# Patient Record
Sex: Female | Born: 1940
Health system: Southern US, Community
[De-identification: ages and names within clinical notes are randomized; demographics above are authoritative.]

## PROBLEM LIST (undated history)

## (undated) DIAGNOSIS — E0500 Thyrotoxicosis with diffuse goiter without thyrotoxic crisis or storm: Secondary | ICD-10-CM

## (undated) DIAGNOSIS — Z9889 Other specified postprocedural states: Secondary | ICD-10-CM

## (undated) DIAGNOSIS — R6520 Severe sepsis without septic shock: Secondary | ICD-10-CM

## (undated) DIAGNOSIS — C50919 Malignant neoplasm of unspecified site of unspecified female breast: Secondary | ICD-10-CM

## (undated) DIAGNOSIS — E781 Pure hyperglyceridemia: Secondary | ICD-10-CM

## (undated) DIAGNOSIS — E0590 Thyrotoxicosis, unspecified without thyrotoxic crisis or storm: Secondary | ICD-10-CM

## (undated) DIAGNOSIS — N179 Acute kidney failure, unspecified: Secondary | ICD-10-CM

## (undated) DIAGNOSIS — R112 Nausea with vomiting, unspecified: Secondary | ICD-10-CM

## (undated) DIAGNOSIS — J189 Pneumonia, unspecified organism: Secondary | ICD-10-CM

## (undated) DIAGNOSIS — J449 Chronic obstructive pulmonary disease, unspecified: Secondary | ICD-10-CM

## (undated) DIAGNOSIS — Z87442 Personal history of urinary calculi: Secondary | ICD-10-CM

## (undated) DIAGNOSIS — A4151 Sepsis due to Escherichia coli [E. coli]: Secondary | ICD-10-CM

## (undated) DIAGNOSIS — R519 Headache, unspecified: Secondary | ICD-10-CM

## (undated) DIAGNOSIS — T148XXA Other injury of unspecified body region, initial encounter: Secondary | ICD-10-CM

## (undated) DIAGNOSIS — K8020 Calculus of gallbladder without cholecystitis without obstruction: Secondary | ICD-10-CM

## (undated) HISTORY — DX: Other injury of unspecified body region, initial encounter: T14.8XXA

## (undated) HISTORY — PX: OTHER SURGICAL HISTORY: SHX169

## (undated) HISTORY — DX: Acute kidney failure, unspecified: N17.9

## (undated) HISTORY — PX: CARPAL TUNNEL RELEASE: SHX101

## (undated) HISTORY — PX: COLONOSCOPY: SHX174

## (undated) HISTORY — PX: NEPHRECTOMY: SHX65

## (undated) HISTORY — PX: EYE SURGERY: SHX253

## (undated) HISTORY — DX: Thyrotoxicosis with diffuse goiter without thyrotoxic crisis or storm: E05.00

## (undated) HISTORY — PX: TONSILLECTOMY: SUR1361

## (undated) HISTORY — DX: Sepsis due to Escherichia coli (e. coli): R65.20

## (undated) HISTORY — DX: Malignant neoplasm of unspecified site of unspecified female breast: C50.919

## (undated) HISTORY — DX: Pure hyperglyceridemia: E78.1

## (undated) HISTORY — PX: MASTECTOMY: SHX3

## (undated) HISTORY — DX: Acute kidney failure, unspecified: A41.51

## (undated) HISTORY — DX: Calculus of gallbladder without cholecystitis without obstruction: K80.20

---
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
## 1988-09-03 DIAGNOSIS — J189 Pneumonia, unspecified organism: Secondary | ICD-10-CM

## 1988-09-03 HISTORY — DX: Pneumonia, unspecified organism: J18.9

## 2001-03-12 ENCOUNTER — Encounter (HOSPITAL_COMMUNITY): Admission: RE | Admit: 2001-03-12 | Discharge: 2001-04-11 | Payer: Self-pay | Admitting: Oncology

## 2001-03-12 ENCOUNTER — Encounter: Admission: RE | Admit: 2001-03-12 | Discharge: 2001-03-12 | Payer: Self-pay | Admitting: Oncology

## 2001-03-17 ENCOUNTER — Other Ambulatory Visit: Admission: RE | Admit: 2001-03-17 | Discharge: 2001-03-17 | Payer: Self-pay | Admitting: Family Medicine

## 2001-03-28 ENCOUNTER — Encounter (HOSPITAL_COMMUNITY): Payer: Self-pay | Admitting: Oncology

## 2001-04-22 ENCOUNTER — Encounter (HOSPITAL_COMMUNITY): Payer: Self-pay | Admitting: Oncology

## 2001-04-22 ENCOUNTER — Encounter (HOSPITAL_COMMUNITY): Admission: RE | Admit: 2001-04-22 | Discharge: 2001-05-22 | Payer: Self-pay | Admitting: Oncology

## 2001-04-22 ENCOUNTER — Encounter: Admission: RE | Admit: 2001-04-22 | Discharge: 2001-04-22 | Payer: Self-pay | Admitting: Oncology

## 2001-08-08 ENCOUNTER — Encounter: Admission: RE | Admit: 2001-08-08 | Discharge: 2001-08-08 | Payer: Self-pay | Admitting: Oncology

## 2001-08-08 ENCOUNTER — Encounter (HOSPITAL_COMMUNITY): Admission: RE | Admit: 2001-08-08 | Discharge: 2001-09-07 | Payer: Self-pay | Admitting: Oncology

## 2001-11-10 ENCOUNTER — Encounter: Admission: RE | Admit: 2001-11-10 | Discharge: 2001-11-10 | Payer: Self-pay | Admitting: Oncology

## 2001-11-10 ENCOUNTER — Encounter (HOSPITAL_COMMUNITY): Admission: RE | Admit: 2001-11-10 | Discharge: 2001-12-10 | Payer: Self-pay | Admitting: Oncology

## 2002-03-16 ENCOUNTER — Encounter: Admission: RE | Admit: 2002-03-16 | Discharge: 2002-03-16 | Payer: Self-pay | Admitting: Oncology

## 2002-03-16 ENCOUNTER — Encounter (HOSPITAL_COMMUNITY): Admission: RE | Admit: 2002-03-16 | Discharge: 2002-04-15 | Payer: Self-pay | Admitting: Oncology

## 2002-03-26 ENCOUNTER — Encounter: Payer: Self-pay | Admitting: Family Medicine

## 2002-03-26 ENCOUNTER — Ambulatory Visit (HOSPITAL_COMMUNITY): Admission: RE | Admit: 2002-03-26 | Discharge: 2002-03-26 | Payer: Self-pay | Admitting: Family Medicine

## 2002-09-14 ENCOUNTER — Encounter: Admission: RE | Admit: 2002-09-14 | Discharge: 2002-09-14 | Payer: Self-pay | Admitting: Oncology

## 2002-09-14 ENCOUNTER — Encounter (HOSPITAL_COMMUNITY): Admission: RE | Admit: 2002-09-14 | Discharge: 2002-10-14 | Payer: Self-pay | Admitting: Oncology

## 2003-03-12 ENCOUNTER — Encounter: Admission: RE | Admit: 2003-03-12 | Discharge: 2003-03-12 | Payer: Self-pay | Admitting: Oncology

## 2003-04-16 ENCOUNTER — Encounter: Payer: Self-pay | Admitting: Family Medicine

## 2003-04-16 ENCOUNTER — Ambulatory Visit (HOSPITAL_COMMUNITY): Admission: RE | Admit: 2003-04-16 | Discharge: 2003-04-16 | Payer: Self-pay | Admitting: Family Medicine

## 2004-03-10 ENCOUNTER — Encounter: Admission: RE | Admit: 2004-03-10 | Discharge: 2004-03-10 | Payer: Self-pay | Admitting: Oncology

## 2004-03-10 ENCOUNTER — Encounter (HOSPITAL_COMMUNITY): Admission: RE | Admit: 2004-03-10 | Discharge: 2004-04-09 | Payer: Self-pay | Admitting: Oncology

## 2004-04-20 ENCOUNTER — Ambulatory Visit (HOSPITAL_COMMUNITY): Admission: RE | Admit: 2004-04-20 | Discharge: 2004-04-20 | Payer: Self-pay | Admitting: Family Medicine

## 2005-04-06 ENCOUNTER — Encounter (HOSPITAL_COMMUNITY): Admission: RE | Admit: 2005-04-06 | Discharge: 2005-05-06 | Payer: Self-pay | Admitting: Oncology

## 2005-04-06 ENCOUNTER — Ambulatory Visit (HOSPITAL_COMMUNITY): Payer: Self-pay | Admitting: Oncology

## 2005-04-06 ENCOUNTER — Encounter: Admission: RE | Admit: 2005-04-06 | Discharge: 2005-04-06 | Payer: Self-pay | Admitting: Oncology

## 2005-04-23 ENCOUNTER — Ambulatory Visit (HOSPITAL_COMMUNITY): Admission: RE | Admit: 2005-04-23 | Discharge: 2005-04-23 | Payer: Self-pay | Admitting: Family Medicine

## 2005-05-09 ENCOUNTER — Ambulatory Visit (HOSPITAL_COMMUNITY): Admission: RE | Admit: 2005-05-09 | Discharge: 2005-05-09 | Payer: Self-pay | Admitting: Family Medicine

## 2005-11-21 ENCOUNTER — Ambulatory Visit (HOSPITAL_COMMUNITY): Admission: RE | Admit: 2005-11-21 | Discharge: 2005-11-21 | Payer: Self-pay | Admitting: Family Medicine

## 2006-02-22 ENCOUNTER — Ambulatory Visit (HOSPITAL_COMMUNITY): Admission: RE | Admit: 2006-02-22 | Discharge: 2006-02-22 | Payer: Self-pay | Admitting: Internal Medicine

## 2006-04-01 ENCOUNTER — Ambulatory Visit (HOSPITAL_COMMUNITY): Admission: RE | Admit: 2006-04-01 | Discharge: 2006-04-01 | Payer: Self-pay | Admitting: Neurosurgery

## 2006-04-25 ENCOUNTER — Ambulatory Visit (HOSPITAL_COMMUNITY): Admission: RE | Admit: 2006-04-25 | Discharge: 2006-04-25 | Payer: Self-pay | Admitting: Family Medicine

## 2006-04-25 ENCOUNTER — Ambulatory Visit (HOSPITAL_COMMUNITY): Payer: Self-pay | Admitting: Oncology

## 2006-04-25 ENCOUNTER — Encounter (HOSPITAL_COMMUNITY): Admission: RE | Admit: 2006-04-25 | Discharge: 2006-05-25 | Payer: Self-pay | Admitting: Oncology

## 2006-04-25 ENCOUNTER — Encounter: Admission: RE | Admit: 2006-04-25 | Discharge: 2006-04-25 | Payer: Self-pay | Admitting: Oncology

## 2006-05-10 ENCOUNTER — Ambulatory Visit (HOSPITAL_COMMUNITY): Admission: RE | Admit: 2006-05-10 | Discharge: 2006-05-10 | Payer: Self-pay | Admitting: Internal Medicine

## 2006-05-10 ENCOUNTER — Ambulatory Visit: Payer: Self-pay | Admitting: Internal Medicine

## 2006-05-10 ENCOUNTER — Encounter (INDEPENDENT_AMBULATORY_CARE_PROVIDER_SITE_OTHER): Payer: Self-pay | Admitting: Specialist

## 2006-05-22 ENCOUNTER — Ambulatory Visit (HOSPITAL_COMMUNITY): Admission: RE | Admit: 2006-05-22 | Discharge: 2006-05-22 | Payer: Self-pay | Admitting: Family Medicine

## 2007-04-25 ENCOUNTER — Ambulatory Visit (HOSPITAL_COMMUNITY): Payer: Self-pay | Admitting: Oncology

## 2007-05-26 ENCOUNTER — Ambulatory Visit (HOSPITAL_COMMUNITY): Admission: RE | Admit: 2007-05-26 | Discharge: 2007-05-26 | Payer: Self-pay | Admitting: Family Medicine

## 2007-05-29 ENCOUNTER — Ambulatory Visit (HOSPITAL_COMMUNITY): Admission: RE | Admit: 2007-05-29 | Discharge: 2007-05-29 | Payer: Self-pay | Admitting: Ophthalmology

## 2007-06-19 ENCOUNTER — Ambulatory Visit (HOSPITAL_COMMUNITY): Admission: RE | Admit: 2007-06-19 | Discharge: 2007-06-19 | Payer: Self-pay | Admitting: Ophthalmology

## 2008-04-21 ENCOUNTER — Ambulatory Visit (HOSPITAL_COMMUNITY): Payer: Self-pay | Admitting: Oncology

## 2008-05-26 ENCOUNTER — Ambulatory Visit (HOSPITAL_COMMUNITY): Admission: RE | Admit: 2008-05-26 | Discharge: 2008-05-26 | Payer: Self-pay | Admitting: Family Medicine

## 2009-04-20 ENCOUNTER — Ambulatory Visit (HOSPITAL_COMMUNITY): Payer: Self-pay | Admitting: Oncology

## 2009-05-27 ENCOUNTER — Ambulatory Visit (HOSPITAL_COMMUNITY): Admission: RE | Admit: 2009-05-27 | Discharge: 2009-05-27 | Payer: Self-pay | Admitting: Family Medicine

## 2010-04-19 ENCOUNTER — Ambulatory Visit (HOSPITAL_COMMUNITY): Payer: Self-pay | Admitting: Oncology

## 2010-04-19 ENCOUNTER — Encounter (HOSPITAL_COMMUNITY): Admission: RE | Admit: 2010-04-19 | Discharge: 2010-05-19 | Payer: Self-pay | Admitting: Oncology

## 2010-05-12 ENCOUNTER — Ambulatory Visit (HOSPITAL_COMMUNITY): Admission: RE | Admit: 2010-05-12 | Discharge: 2010-05-12 | Payer: Self-pay | Admitting: Family Medicine

## 2010-05-29 ENCOUNTER — Ambulatory Visit (HOSPITAL_COMMUNITY): Admission: RE | Admit: 2010-05-29 | Discharge: 2010-05-29 | Payer: Self-pay | Admitting: Family Medicine

## 2010-11-16 LAB — COMPREHENSIVE METABOLIC PANEL
ALT: 16 U/L (ref 0–35)
CO2: 27 mEq/L (ref 19–32)
Chloride: 106 mEq/L (ref 96–112)
GFR calc Af Amer: >60 mL/min (ref >60)
Glucose, Bld: 94 mg/dL (ref 70–99)
Potassium: 4.2 mEq/L (ref 3.5–5.1)
Total Protein: 7.1 g/dL (ref 6.0–8.3)

## 2010-11-16 LAB — CBC
MCH: 31.5 pg (ref 26.0–34.0)
MCHC: 33.5 g/dL (ref 30.0–36.0)
MCV: 93.9 fL (ref 78.0–100.0)
RBC: 4.77 MIL/uL (ref 3.87–5.11)
RDW: 13.7 % (ref 11.5–15.5)

## 2010-11-16 LAB — DIFFERENTIAL
Basophils Absolute: 0.1 10*3/uL (ref 0.0–0.1)
Basophils Relative: 1 % (ref 0–1)
Lymphocytes Relative: 20 % (ref 12–46)
Monocytes Relative: 4 % (ref 3–12)
Neutro Abs: 6.7 10*3/uL (ref 1.7–7.7)

## 2011-01-19 NOTE — Consult Note (Signed)
[name], [name]                            ACCOUNT NO.:  [id number]   MEDICAL RECORD NO.:  [id number]                   PATIENT TYPE:   LOCATION:                                       FACILITY:   PHYSICIAN:  Lionel December, M.D.                 DATE OF BIRTH:  07/01/1941   DATE OF CONSULTATION:  DATE OF DISCHARGE:                           GASTROENTEROLOGY CONSULTATION   REASON FOR CONSULTATION:  Heme-positive stools.   HISTORY OF PRESENT ILLNESS:  The patient is a 70 year old female who was  referred through the courtesy of  Dr. Mariel Sleet for further evaluation of  heme-positive stools.  The last one she had 3 Hemoccults as a routine and 1  out of 3 was positive.  The patient tells me that Dr. Mariel Sleet has been  after her for the past few years to have colonoscopy primarily for screening  purposes but she has been postponing it.  Now with the finding of heme-  positive stools further evaluation was recommended.  She denies melena or  rectal bleeding, or change in her bowel habits.  She generally has a BM  daily.  She also denies nausea, vomiting, heartburn, dysphagia, or abdominal  pain. She does not take any OTC NSAIDS.  There is no history of peptic ulcer  disease.  She states that she gained a lot of weight per her thyroid  problem, but then managed to lose about 8 pounds since she is on appropriate  dose of Synthroid.   REVIEW OF SYSTEMS:  Review of the systems are negative for chronic cough or  exertional dyspnea.  She is not aware __________ COPD.   MEDICATIONS:  She is on Synthroid 112 mcg q.d., calcium 600 mg q.d., vitamin  D q.d., M.V.I. q.d. and Tylenol p.r.n.   PAST MEDICAL HISTORY:  History of infiltrating ductal carcinoma, left  breast, diagnosed in 1990, treated with modified radical mastectomy,  followed by NSABP protocol B19 with CMF followed by tamoxifen that she took  for 5 years.  She has remained in remission.  She is getting her yearly  mammography.   She was diagnosed with Graves' disease about 4 years ago and  was treated with radioiodine and now on replacement therapy.  History of  COPD, and per Dr. Thornton Papas note the patient is not aware of this  diagnosis.  I suspect that this is an x-ray diagnosis.  The patient is a  chronic smoker.   Osteopenia diagnosed via bone density study 03/28/01.  She also has DJD of  lumbosacral spine.   PAST SURGICAL HISTORY:  Other surgeries include tonsillectomy several years  ago.   ALLERGIES:  None known.   FAMILY HISTORY:  Mother is 19 years old.  She has advanced dementia and is  in a nursing home.  Father died of lung carcinoma at age 65.  One sister  died at age 30-1/2 years of accidental  overdose.  One brother is deceased  from pneumonia at age 14, apparently this was during the flu season.  Two  brothers are in good health other than they have high cholesterol.   SOCIAL HISTORY:  She is single. She has 2 children.  She worked at [employer] for 21 years. 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  She does not smoke cigarettes, but has  been smoking a pack a day, she states, for 50 years.  She says that she  started to Smoke when she was a child.   PHYSICAL EXAMINATION:  GENERAL:  A pleasant, well-developed, well-nourished,  Caucasian female who is in no acute distress.  VITAL SIGNS:  She weighs 168 pounds.  She is 5 feet 6 inches tall.  Pulse 74  per minute, blood pressure 120/68, temperature is 98.5.  HEENT:  Conjunctivae are pink.  Sclerae nonicteric.  Oropharyngeal mucosa is  normal.  Dentition is in satisfactory condition.  NECK:  Without masses or thyromegaly.  Carotids are 2+ bilaterally without  bruits.  CARDIAC:  Cardiac exam with regular rhythm.  Normal S1 and S2.  No murmur or  gallop noted.  LUNGS:  Auscultation of the lungs revealed a few rhonchi  at the right base.  ABDOMEN:  It is full and symmetrical.  Bowel sounds are normal.  Palpation  reveals a soft abdomen without tenderness, organomegaly, or masses.  RECTAL:  Rectal examination deferred as she had one by Dr. Renard Matter within  the last month or so.  EXTREMITIES:  She does not have clubbing or peripheral edema.   ASSESSMENT:  The patient is a 70 year old Caucasian female who has no  gastrointestinal symptoms and does not take any NSAIDS.  She was found to  have heme-positive stools on routine testing.  Family history is negative  for colorectal carcinoma.  Personal history is significant for breast CA and  she remains in remission.  Because of personal history her risk of colon  carcinoma would be slightly above average.  I agree that she needs to  undergo a colonoscopy.   She does have a few rhonchi along on lung exam.  I did tell her that she has  chronic obstructive pulmonary disease, although she is in asymptomatic stage  and she needs to make every effort to try to quit cigarette smoking.   RECOMMENDATIONS:  Total colonoscopy to be performed at [facility] in the near  future when the patient's schedule permits.  I have reviewed the procedure,  in detail, as well as the risks and alternatives and she is agreeable.   I would like to thank Dr. Mariel Sleet for giving [place] the opportunity to  participate in the care of this nice lady.                                               Lionel December, M.D.    NR/MEDQ  D:  04/08/2002  T:  04/12/2002  Job:  [id number]   cc:   Ladona Horns. Mariel Sleet, M.D.   [facility]   Angus G. Renard Matter, M.D.

## 2011-01-19 NOTE — Op Note (Signed)
NAMEANDRIAN, URBACH                ACCOUNT NO.:  [id number]   MEDICAL RECORD NO.:  [id number]          PATIENT TYPE:  AMB   LOCATION:  SDS                          FACILITY:  [facility]   PHYSICIAN:  Coletta Memos, M.D.     DATE OF BIRTH:  [birth year]   DATE OF PROCEDURE:  04/01/2006  DATE OF DISCHARGE:                                 OPERATIVE REPORT   PREOPERATIVE DIAGNOSIS:  Right carpal tunnel syndrome.   POSTOPERATIVE DIAGNOSIS:  Right carpal tunnel syndrome.   PROCEDURE:  Right carpal tunnel release.   ANESTHETIC:  IV sedation with local anesthetic.   SURGEON:  Coletta Memos, M.D.   COMPLICATIONS:  None.   INDICATIONS:  Mrs. Nahm presents with the right carpal tunnel disease and  profound numbness of right hand.  I recommended and she agreed to undergo  operative decompression.  She said she just couldn't take it any more.   OPERATIVE NOTE:  Ms. Hogan was brought to the operating room.  The right  upper extremity was prepped and she was draped in a sterile fashion.  I  infiltrated my proposed incision starting from the distal palmar crease and  extending into the hand approximately 3-4 cm.  I infiltrated 6 mL of 0.5%  lidocaine 1:200,000 strength epinephrine.  Once I had achieved adequate  anesthesia by testing Ms. Bessire, I opened the incision with a #15 blade.  I  took this down through soft tissue.  I used bipolar for hemostasis and the  subcutaneous fat.  I placed a self-retaining retractor.  I then with the  knee proceeded to dissect carefully through the transverse carpal ligament.  When I finally broken through, I then used a hemostat to protect the  contents of the carpal tunnel.  I used 15 blade above that and cut distally  into the hand.  I then used Metzenbaum scissors to cut proximally until I  was satisfied that the entire length of the transverse carpal ligament had  been decompressed.  I then irrigated the wound.  I then closed with a single  layer using vertical  mattress sutures which were interrupted.  Sterile  dressing was applied.  Mrs. Cariker tolerated procedure well, moving her thumb  easily throughout.           ______________________________  Coletta Memos, M.D.     KC/MEDQ  D:  04/01/2006  T:  04/01/2006  Job:  [id number]

## 2011-01-19 NOTE — Op Note (Signed)
[name], KJOS                ACCOUNT NO.:  [id number]   MEDICAL RECORD NO.:  [id number]          PATIENT TYPE:  AMB   LOCATION:  DAY                           FACILITY:  [facility]   PHYSICIAN:  R. Roetta Sessions, M.D. DATE OF BIRTH:  1940/10/11   DATE OF PROCEDURE:  05/10/2006  DATE OF DISCHARGE:                                 OPERATIVE REPORT   PROCEDURE:  Colonoscopy with snare polypectomy.   INDICATIONS FOR PROCEDURE:  Patient is a 70 year old lady sent over at the  courtesy of Dr. Ishmael Holter. Mc[place] with no lower GI tract symptoms for  colorectal cancer screening.  She has never had her lower GI tract imaged.   FAMILY HISTORY:  Positive in a brother who was diagnosed with colorectal  cancer at age 42.   This approach has been discussed with the patient at length.  Potential  risks, benefits, and alternatives have been reviewed, questions answered and  she is agreeable.  Please see the documentation in medical record.   DESCRIPTION OF PROCEDURE:  Patient's saturation, blood pressure, pulses and  respirations were monitored throughout the entire procedure.  Conscious  sedation with Versed 3mg  IV and Demerol 75 mg IV in divided doses.   INSTRUMENT:  Olympus video chip system.   FINDINGS:  Digital rectal exam revealed no abnormality.   ENDOSCOPIC FINDINGS:  Prep was good.   Rectum:  Examination of the rectal mucosa including retroflexed view of the  anal verge revealed no abnormalities.   Colon:  Colonic mucosa was able to be taken to rectosigmoid junction to the  left transverse and right colon to the area of appendiceal orifice,  ileocecal valve and cecum.  This junction was well seen and photographed for  the record.  From this level the scope was slowly cautiously withdrawn.  All  previously mentioned mucosal surfaces were again seen.  Patient was noted to  have left-sided diverticula and a similar polyp pedunculated in the mid  ascending colon and a 5 mm in the mid  descending colon.  These polyps were  removed with hot snare cautery.  Remainder of the colonic mucosa appeared  normal.  Patient tolerated the procedure as very well as reactive.   ENDOSCOPY IMPRESSION:  1. Normal rectum.  2. Sigmoid diverticula, polyps in the right and left colon removed as      described above.  Remainder of colonic areas appeared normal.   RECOMMENDATIONS:  1. Diverticulosis literature provided to Ms. Crunk.  2. No aspirin or arthritis medications for the next 10 days.  3. Follow up on pathology.  4. Further recommendations to follow.      Jonathon Bellows, M.D.  Electronically Signed     RMR/MEDQ  D:  05/10/2006  T:  05/10/2006  Job:  [id number]

## 2011-04-18 ENCOUNTER — Encounter (HOSPITAL_COMMUNITY): Payer: Self-pay | Admitting: Oncology

## 2011-04-18 ENCOUNTER — Encounter (HOSPITAL_COMMUNITY): Payer: Medicare PPO | Attending: Oncology | Admitting: Oncology

## 2011-04-18 ENCOUNTER — Ambulatory Visit (HOSPITAL_COMMUNITY): Payer: Self-pay | Admitting: Oncology

## 2011-04-18 DIAGNOSIS — J449 Chronic obstructive pulmonary disease, unspecified: Secondary | ICD-10-CM

## 2011-04-18 DIAGNOSIS — C50919 Malignant neoplasm of unspecified site of unspecified female breast: Secondary | ICD-10-CM

## 2011-04-18 NOTE — Patient Instructions (Signed)
[facility]  Discharge Instructions  RECOMMENDATIONS MADE BY THE CONSULTANT AND ANY TEST RESULTS WILL BE SENT TO YOUR REFERRING DOCTOR.   EXAM FINDINGS BY MD TODAY AND SIGNS AND SYMPTOMS TO REPORT TO CLINIC OR PRIMARY MD:   CT scan in 1 week. Follow up with Dr. Mariel Sleet in 1 year.    I acknowledge that I have been informed and understand all the instructions given to me and received a copy. I do not have any more questions at this time, but understand that I may call the Specialty Clinic at [facility] at [phone] during business hours should I have any further questions or need assistance in obtaining follow-up care.    __________________________________________  _____________  __________ Signature of Patient or Authorized Representative            Date                   Time    __________________________________________ Nurse's Signature

## 2011-04-18 NOTE — Progress Notes (Signed)
This office note has been dictated.

## 2011-04-23 ENCOUNTER — Other Ambulatory Visit (HOSPITAL_COMMUNITY): Payer: Self-pay | Admitting: Family Medicine

## 2011-04-23 DIAGNOSIS — Z139 Encounter for screening, unspecified: Secondary | ICD-10-CM

## 2011-04-24 ENCOUNTER — Encounter (HOSPITAL_COMMUNITY): Payer: Self-pay

## 2011-04-24 ENCOUNTER — Ambulatory Visit (HOSPITAL_COMMUNITY)
Admission: RE | Admit: 2011-04-24 | Discharge: 2011-04-24 | Disposition: A | Discharge status: Home / Self Care | Payer: Medicare PPO | Source: Ambulatory Visit | Attending: Oncology | Admitting: Oncology

## 2011-04-24 DIAGNOSIS — R05 Cough: Secondary | ICD-10-CM | POA: Insufficient documentation

## 2011-04-24 DIAGNOSIS — R059 Cough, unspecified: Secondary | ICD-10-CM | POA: Insufficient documentation

## 2011-04-24 DIAGNOSIS — C50919 Malignant neoplasm of unspecified site of unspecified female breast: Secondary | ICD-10-CM

## 2011-04-24 DIAGNOSIS — J449 Chronic obstructive pulmonary disease, unspecified: Secondary | ICD-10-CM

## 2011-04-24 DIAGNOSIS — Z853 Personal history of malignant neoplasm of breast: Secondary | ICD-10-CM | POA: Insufficient documentation

## 2011-04-27 NOTE — Progress Notes (Signed)
CC:   [name] Matter, MD [name], M.D. Shelby Bonds, RN  DIAGNOSIS: 1. Stage II (T2 N0 M0) left-sided adenocarcinoma breast diagnosed in     1990 by Dr. Malvin Johns and treated by Dr. Berton Mount on NSAB PPD-19     with CMF followed by 5 years of tamoxifen and she remains disease     free thus far. 2. COPD with smoking but she also has rales at the left lower base     which do not clear with coughing and she had a greater than 40 pack-     year history of smoking. 3. Graves disease diagnosed in 1999 treated I-131 ablation, now on     Synthroid and she tells me today that she does have diplopia which     has been present for years and years.  It is not changed. 4. Hypertriglyceridemia on therapy.  [name] has not had any changes in her weight in the last year.  Her vital signs otherwise were fine.  She is not aware of coughing up blood. She has a little bit of increase in cough she states but she is bringing up some yellow phlegm 3-4 times a day which is not new whatsoever.  She has been doing this for several years she states.  She is not losing her appetite.  She has had no lumps anywhere that she is aware. No skin changes.  Bowels are working well, etc.  She has hot flashes but no fevers, chills or night sweats.  PHYSICAL EXAMINATION:  Her weight 141 pounds today.  Blood pressure 123/76.  She is afebrile, respiratory 18 and unlabored at rest.  She still has very prominent eyes, consistent with Graves disease exophthalmos.  Her neck is supple.  She has no lymphadenopathy.  Facial symmetry is intact but I cannot document that she has changes in her eye focus based upon light reflection from her pupils.  She has no adenopathy in cervical, supraclavicular, infraclavicular, axillary or inguinal areas.  The left chest wall is clear.  The right breast is negative for any masses.  Her lungs show diminished breath sounds but she does have rales at the left lower lung base which  do not clear with coughing.  These were not heard last year.  Her abdomen is soft and nontender without organomegaly. Heart shows a regular rhythm and rate without murmur, rub or gallop.  She has no peripheral edema in the arms or legs.  I think [name] should have a CT of the chest for several reasons:  One, she has rales.  Two, she may have a slight change in her cough pattern she states and three, she has a longstanding smoking history and she is at high risk for lung cancer and she also has a diagnosis of breast cancer.  To that end, she has agreed to a CT of the chest which I have recommended and we will set that up for next Tuesday. Otherwise if that is okay, she will call us and see [place] in a year for another appointment. She will let me know how she is doing if things change, but I asked her to call me after the CT scan is performed.  We spent about 35-40 minutes, half of which time was spent in counseling and coordination of care.    ______________________________ [name]. Mariel Sleet, MD ESN/MEDQ  D:  04/18/2011  T:  04/18/2011  Job:  [id number]

## 2011-06-04 ENCOUNTER — Ambulatory Visit (HOSPITAL_COMMUNITY)
Admission: RE | Admit: 2011-06-04 | Discharge: 2011-06-04 | Disposition: A | Discharge status: Home / Self Care | Payer: Medicare PPO | Source: Ambulatory Visit | Attending: Family Medicine | Admitting: Family Medicine

## 2011-06-04 DIAGNOSIS — Z1231 Encounter for screening mammogram for malignant neoplasm of breast: Secondary | ICD-10-CM | POA: Insufficient documentation

## 2011-06-04 DIAGNOSIS — Z139 Encounter for screening, unspecified: Secondary | ICD-10-CM

## 2011-06-14 LAB — BASIC METABOLIC PANEL
CO2: 27
Calcium: 9.2
Chloride: 107
Creatinine, Ser: 0.61
GFR calc Af Amer: >60
GFR calc non Af Amer: >60
Sodium: 141

## 2012-04-16 ENCOUNTER — Encounter (HOSPITAL_COMMUNITY): Payer: Medicare PPO | Attending: Oncology | Admitting: Oncology

## 2012-04-16 ENCOUNTER — Encounter (HOSPITAL_COMMUNITY): Payer: Self-pay | Admitting: Oncology

## 2012-04-16 VITALS — BP 123/69 | HR 77 | Temp 98.4°F | Resp 16 | Wt 143.0 lb

## 2012-04-16 DIAGNOSIS — F17200 Nicotine dependence, unspecified, uncomplicated: Secondary | ICD-10-CM

## 2012-04-16 DIAGNOSIS — E0500 Thyrotoxicosis with diffuse goiter without thyrotoxic crisis or storm: Secondary | ICD-10-CM

## 2012-04-16 DIAGNOSIS — Z853 Personal history of malignant neoplasm of breast: Secondary | ICD-10-CM

## 2012-04-16 DIAGNOSIS — C50919 Malignant neoplasm of unspecified site of unspecified female breast: Secondary | ICD-10-CM

## 2012-04-16 DIAGNOSIS — R911 Solitary pulmonary nodule: Secondary | ICD-10-CM

## 2012-04-16 HISTORY — DX: Malignant neoplasm of unspecified site of unspecified female breast: C50.919

## 2012-04-16 NOTE — Progress Notes (Signed)
Shelby Reichert, MD [address] [place] [address]  1. Adenocarcinoma of left breast     CURRENT THERAPY: Observation  INTERVAL HISTORY: [name] 71 y.o. female returns for  regular  visit for followup of Stage II (T2 N0 M0) left-sided adenocarcinoma breast diagnosed in 1990 by Dr. Malvin Johns and treated by Dr. Berton Mount on NSAB PPD-19 with CMF followed by 5 years of tamoxifen and she remains disease free thus far.    [name] is doing well.  She continues to smoke 1 ppd.  I personally reviewed and went over radiographic studies with the patient.  We reviewed her CT of chest last year which did reveal emphysema in addition to B/L nodular foci in lungs.  It was recommended for a follow-up CT in 1 year, so we will get that scheduled for this month.  I provided smoking cessation education.   She continues to be involved with her church.  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  She is due for her yearly mammogram in October and I reminded her of that.  She does have lab work done by her PCP, Dr. Renard Matter.  She denies any complaints.   Past Medical History  Diagnosis Date  . Grave's disease   . Nerve compression     pinched in neck  . High triglycerides   . Breast cancer     left breast/mastectomy/dx approx1990  . Adenocarcinoma of left breast 04/16/2012    Stage II (T2 N0 M0) left-sided adenocarcinoma breast diagnosed in 1990 by Dr. Malvin Johns and treated by Dr. Berton Mount on NSAB PPD-19 with CMF followed by 5 years of tamoxifen and she remains disease free thus far.     has Adenocarcinoma of left breast on her problem list.      has no known allergies.  [name] does not currently have medications on file.  Past Surgical History  Procedure Date  . Mastectomy     left.  . Carpal tunnel release     rt. hand  . Cataract surgery     bilateral    Denies any headaches, dizziness, double vision, fevers, chills, night sweats, nausea, vomiting, diarrhea,  constipation, chest pain, heart palpitations, shortness of breath, blood in stool, black tarry stool, urinary pain, urinary burning, urinary frequency, hematuria.   PHYSICAL EXAMINATION  ECOG PERFORMANCE STATUS: 1 - Symptomatic but completely ambulatory  Filed Vitals:   04/16/12 0947  BP: 123/69  Pulse: 77  Temp: 98.4 F (36.9 C)  Resp: 16    GENERAL:alert, no distress, well nourished, well developed, comfortable, cooperative and smiling, smelling of tobacco smoke. SKIN: skin color, texture, turgor are normal, no rashes or significant lesions HEAD: Normocephalic, No masses, lesions, tenderness or abnormalities EYES: normal, Conjunctiva are pink and non-injected EARS: External ears normal OROPHARYNX:lips, buccal mucosa, and tongue normal and mucous membranes are moist  NECK: supple, no adenopathy, thyroid normal size, non-tender, without nodularity, no stridor, non-tender, trachea midline LYMPH:  no palpable lymphadenopathy, no hepatosplenomegaly BREAST:right breast normal without mass, skin or nipple changes or axillary nodes, left post-mastectomy site well healed and free of suspicious changes LUNGS: clear to auscultation and percussion, hyperresonant to percussion HEART: regular rate & rhythm, no murmurs, no gallops, S1 normal and S2 normal ABDOMEN:abdomen soft, non-tender, normal bowel sounds, no masses or organomegaly and no hepatosplenomegaly BACK: Back symmetric, no curvature. EXTREMITIES:less then 2 second capillary refill, no joint deformities, effusion, or inflammation, no edema, no skin discoloration, no clubbing,  no cyanosis  NEURO: alert & oriented x 3 with fluent speech, no focal motor/sensory deficits, gait normal   RADIOGRAPHIC STUDIES:  06/03/2012  DG SCREENING MAMMOGRAM RIGHT  CC and MLO view(s) were taken of the right breast.  RIGHT DIGITAL SCREENING MAMMOGRAM WITH CAD:  Comparison: Prior studies.  There are scattered fibroglandular densities. There is no  dominant mass, architectural distortion  or calcification to suggest malignancy.  Images were processed with CAD.  IMPRESSION:  No mammographic evidence of malignancy. Suggest yearly screening mammography.  A result letter of this screening mammogram will be mailed directly to the patient.  ASSESSMENT: Negative - BI-RADS 1  Screening mammogram in 1 year.   04/24/2011  *RADIOLOGY REPORT*  Clinical Data: Rales left base, cough, history smoking, remote  breast cancer  CT CHEST WITHOUT CONTRAST  Technique: Multidetector CT imaging of the chest was performed  following the standard protocol without IV contrast. Sagittal and  coronal MPR images reconstructed from axial data set.  Comparison: None  Findings:  Post left mastectomy and axillary lymph node dissection.  No thoracic adenopathy.  Scattered atherosclerotic calcifications aorta.  Low attenuation thickening of left adrenal gland question  hyperplasia without mass.  Scattered emphysematous changes with diffuse interstitial  prominence in both lungs.  Few tiny areas of peripheral subpleural nodularity noted,  nonspecific.  No discrete pulmonary mass, pulmonary infiltrate, or pleural  effusion.  Central pulmonary arteries slightly prominent.  Scattered peribronchial thickening.  No acute osseous findings.  IMPRESSION:  Emphysematous, bronchitic, and minimal diffuse interstitial lung  disease changes throughout both lungs.  No other significant intrathoracic abnormalities.  Specifically, no cause for left lower lobe physical exam findings.  Tiny nonspecific questionable nodular foci in both lungs,  recommendations below.  If the patient is at high risk for bronchogenic carcinoma, follow-  up chest CT at 1 year is recommended. If the patient is at low  risk, no follow-up is needed. This recommendation follows the  consensus statement: Guidelines for Management of Small Pulmonary  Nodules Detected on CT Scans: A Statement from  the Fleischner  Society as published in Radiology 2005; 237:395-400. Available  online at: DietDisorder.cz.  Original Report Authenticated By: Lollie Marrow, M.D.    ASSESSMENT:  1. Stage II (T2 N0 M0) left-sided adenocarcinoma breast diagnosed in 1990 by Dr. Malvin Johns and treated by Dr. Berton Mount on NSAB PPD-19 with CMF followed by 5 years of tamoxifen and she remains disease free thus far.  2. COPD with smoking but she also has rales at the left lower base which do not clear with coughing and she had a greater than 40 pack- year history of smoking.  3. Graves disease diagnosed in 1999 treated I-131 ablation, now on Synthroid  4. Hypertriglyceridemia on therapy.   PLAN:  1. I personally reviewed and went over radiographic studies with the patient. 2. Patient is due for her yearly mammogram this fall. 3. In light of her CT of chest in August of 2012, she is due to have another follow-up CT of the chest due to the identified nodular foci in both lungs.  So we will set that up for this month.  4. Return in 1 year for follow-up.     All questions were answered. The patient knows to call the clinic with any problems, questions or concerns. We can certainly see the patient much sooner if necessary.  KEFALAS,THOMAS

## 2012-04-16 NOTE — Patient Instructions (Signed)
[name]  DOB 01/19/1941 CSN [id number]  MRN [id number] Dr. Glenford Peers  [facility]  Discharge Instructions  RECOMMENDATIONS MADE BY THE CONSULTANT AND ANY TEST RESULTS WILL BE SENT TO YOUR REFERRING DOCTOR.   Exam good today. We will schedule you for a CT scan within 1 month. Return to clinic in 1 year to see MD.   I acknowledge that I have been informed and understand all the instructions given to me and received a copy. I do not have any more questions at this time, but understand that I may call the Specialty Clinic at [facility] at [phone] during business hours should I have any further questions or need assistance in obtaining follow-up care.    __________________________________________  _____________  __________ Signature of Patient or Authorized Representative            Date                   Time    __________________________________________ Nurse's Signature

## 2012-04-28 ENCOUNTER — Ambulatory Visit (HOSPITAL_COMMUNITY)
Admission: RE | Admit: 2012-04-28 | Discharge: 2012-04-28 | Disposition: A | Discharge status: Home / Self Care | Payer: Medicare PPO | Source: Ambulatory Visit | Attending: Oncology | Admitting: Oncology

## 2012-04-28 DIAGNOSIS — Z853 Personal history of malignant neoplasm of breast: Secondary | ICD-10-CM | POA: Insufficient documentation

## 2012-04-28 DIAGNOSIS — R599 Enlarged lymph nodes, unspecified: Secondary | ICD-10-CM | POA: Insufficient documentation

## 2012-04-28 DIAGNOSIS — C50919 Malignant neoplasm of unspecified site of unspecified female breast: Secondary | ICD-10-CM

## 2012-04-28 DIAGNOSIS — R911 Solitary pulmonary nodule: Secondary | ICD-10-CM | POA: Insufficient documentation

## 2012-05-01 ENCOUNTER — Other Ambulatory Visit (HOSPITAL_COMMUNITY): Payer: Self-pay | Admitting: Family Medicine

## 2012-05-01 DIAGNOSIS — Z139 Encounter for screening, unspecified: Secondary | ICD-10-CM

## 2012-05-20 ENCOUNTER — Other Ambulatory Visit (HOSPITAL_COMMUNITY): Payer: Self-pay | Admitting: Family Medicine

## 2012-05-20 DIAGNOSIS — M810 Age-related osteoporosis without current pathological fracture: Secondary | ICD-10-CM

## 2012-05-22 ENCOUNTER — Ambulatory Visit (HOSPITAL_COMMUNITY)
Admission: RE | Admit: 2012-05-22 | Discharge: 2012-05-22 | Disposition: A | Discharge status: Home / Self Care | Payer: Medicare PPO | Source: Ambulatory Visit | Attending: Family Medicine | Admitting: Family Medicine

## 2012-05-22 DIAGNOSIS — M818 Other osteoporosis without current pathological fracture: Secondary | ICD-10-CM | POA: Insufficient documentation

## 2012-05-22 DIAGNOSIS — M810 Age-related osteoporosis without current pathological fracture: Secondary | ICD-10-CM

## 2012-06-05 ENCOUNTER — Ambulatory Visit (HOSPITAL_COMMUNITY)
Admission: RE | Admit: 2012-06-05 | Discharge: 2012-06-05 | Disposition: A | Discharge status: Home / Self Care | Payer: Medicare PPO | Source: Ambulatory Visit | Attending: Family Medicine | Admitting: Family Medicine

## 2012-06-05 DIAGNOSIS — Z853 Personal history of malignant neoplasm of breast: Secondary | ICD-10-CM | POA: Insufficient documentation

## 2012-06-05 DIAGNOSIS — Z139 Encounter for screening, unspecified: Secondary | ICD-10-CM

## 2013-04-15 ENCOUNTER — Encounter (HOSPITAL_COMMUNITY): Payer: Self-pay

## 2013-04-15 ENCOUNTER — Encounter (HOSPITAL_COMMUNITY): Payer: Medicare PPO | Attending: Internal Medicine

## 2013-04-15 VITALS — BP 114/70 | HR 70 | Temp 97.9°F | Resp 16 | Wt 147.4 lb

## 2013-04-15 DIAGNOSIS — R911 Solitary pulmonary nodule: Secondary | ICD-10-CM

## 2013-04-15 DIAGNOSIS — Z853 Personal history of malignant neoplasm of breast: Secondary | ICD-10-CM

## 2013-04-15 DIAGNOSIS — F17200 Nicotine dependence, unspecified, uncomplicated: Secondary | ICD-10-CM

## 2013-04-15 DIAGNOSIS — C50912 Malignant neoplasm of unspecified site of left female breast: Secondary | ICD-10-CM

## 2013-04-15 NOTE — Patient Instructions (Addendum)
[facility] Discharge Instructions  RECOMMENDATIONS MADE BY THE CONSULTANT AND ANY TEST RESULTS WILL BE SENT TO YOUR REFERRING PHYSICIAN.  EXAM FINDINGS BY THE PHYSICIAN TODAY AND SIGNS OR SYMPTOMS TO REPORT TO CLINIC OR PRIMARY PHYSICIAN: Exam and discussion by Dr. Neale Burly.  We will recheck your CT scan of your chest to see if there are any changes.  Will also make referral to dermatology to evaluate area on your nose and a skin check due to multiple moles. none  MEDICATIONS PRESCRIBED:  None   INSTRUCTIONS GIVEN AND DISCUSSED: Report any new lumps, bone pain, shortness of breath or other symptoms.  SPECIAL INSTRUCTIONS/FOLLOW-UP: CT of chest, dermatology consult and follow-up in 1 year.  Thank you for choosing [facility] to provide your oncology and hematology care.  To afford each patient quality time with our providers, please arrive at least 15 minutes before your scheduled appointment time.  With your help, our goal is to use those 15 minutes to complete the necessary work-up to ensure our physicians have the information they need to help with your evaluation and healthcare recommendations.    Effective January 1st, 2014, we ask that you re-schedule your appointment with our physicians should you arrive 10 or more minutes late for your appointment.  We strive to give you quality time with our providers, and arriving late affects you and other patients whose appointments are after yours.    Again, thank you for choosing [facility].  Our hope is that these requests will decrease the amount of time that you wait before being seen by our physicians.       _____________________________________________________________  Should you have questions after your visit to [facility], please contact our office at [phone] between the hours of 8:30 a.m. and 5:00 p.m.  Voicemails left after 4:30 p.m. will not be returned until the  following business day.  For prescription refill requests, have your pharmacy contact our office with your prescription refill request.

## 2013-04-15 NOTE — Progress Notes (Signed)
[name] OFFICE PROGRESS NOTE  PCP: Shelby Reichert, MD [address] [place] [address]  DIAGNOSIS: Lung nodule seen on imaging study - Plan: CT Chest Wo Contrast  Adenocarcinoma of breast, left - Plan: CT Chest Wo Contrast  CURRENT THERAPY: None  INTERVAL HISTORY: [name] 72 y.o. female returns for routine yearly followup. History is prior noted of a breast cancer back in 2000 treated with CMF and 5 years of tamoxifen. It is up to date with primary care. Patient had low back pain which is better, had a CT scan, as asymptomatic gallstones, has no other acute problems. Is due for a mammogram in October. Was last seen one year ago. At that time she had a CT of the chest to followup small pulmonary nodules that turned out to be stable from August 2012 to August 2013. He was also a small mediastinal lymph node seen. Currently no headache dizziness chills sweats chest or abdominal pain fever or new bone pain. Patient has noted a small skin lesion at the nasal fold for her eyeglasses make contact    MEDICAL HISTORY: Past Medical History  Diagnosis Date  . Grave's disease   . Nerve compression     pinched in neck  . High triglycerides   . Breast cancer     left breast/mastectomy/dx approx1990  . Adenocarcinoma of left breast 04/16/2012    Stage II (T2 N0 M0) left-sided adenocarcinoma breast diagnosed in 1990 by Dr. Malvin Tyler and treated by Dr. Berton Tyler on NSAB PPD-19 with CMF followed by 5 years of tamoxifen and she remains disease free thus far.   [name] bladder stones    also past medical history includes osteoporosis  SURGICAL HISTORY:  Past Surgical History  Procedure Laterality Date  . Mastectomy      left.  . Carpal tunnel release      rt. hand  . Cataract surgery      bilateral      PROBLEM LIST : has Adenocarcinoma of left breast on her problem list.    ALLERGIES:  has No Known Allergies.  MEDICATIONS: Current outpatient  prescriptions:aspirin 325 MG tablet, Take 325 mg by mouth as needed.  , Disp: , Rfl: ;  fish oil-omega-3 fatty acids 1000 MG capsule, Take 1,200 mg by mouth daily. Takes 2 daily, Disp: , Rfl: ;  levothyroxine (SYNTHROID, LEVOTHROID) 112 MCG tablet, Take 112 mcg by mouth daily.  , Disp: , Rfl: ;  meclizine (ANTIVERT) 50 MG tablet, Take 25 mg by mouth 3 (three) times daily as needed.  , Disp: , Rfl:  Multiple Vitamins-Minerals (CENTRUM SILVER ULTRA WOMENS) TABS, Take by mouth daily.  , Disp: , Rfl: ;  simvastatin (ZOCOR) 20 MG tablet, Take 20 mg by mouth at bedtime.  , Disp: , Rfl:     REVIEW OF SYSTEMS.  No headache dizziness chills sweats chest pain palpitation shortness of breath abdominal pain edema, has had some recent low back pain but this is improved   PHYSICAL EXAMINATION: ECOG PERFORMANCE STATUS: 0 - Asymptomatic  Filed Vitals:   04/15/13 0848  BP: 114/70  Pulse: 70  Temp: 97.9 F (36.6 C)  Resp: 16    GENERAL: No distress, well nourished.  SKIN:  No rashes or bruising  HEAD: Normocephalic, No trauma EYES: Sclera and conjuntiva clear  ENT: No thrush LYMPH: No palpable lymphadenopathy neck, supraclavicular submandibular axilla BREAST: On the right, Normal without mass, skin or nipple changes o, no tenderness, the  left is status postmastectomy the chest wall is unremarkable no tenderness no skin or chest wall recurrence  LUNGS: Clear to auscultation, no crackles or wheezes or rhonchi HEART: Regular rate & rhythm,   ABDOMEN: Abdomen soft, non-tender, , no masses or organomegaly   MSK: No deformity, no tenderness over spine, no acutely hot or inflammed joints EXTREMITIES: No lower ext edema NEURO: Alert & oriented, no gross focal weakness. PSYCH  Cooperative, mood/affect normal  SKIN. On the right nasal fold where the eyeglasses make contact there is a small approximately 2 mm smooth rounded lesion was in appearance possibly benign but consistent with a squamous cancer. Patient  also has a number of caf au lait spots as well as one pigmented spot abdominal wall and to one pigmented mole in the axilla, which patient thinks have been stable for many years but she does have a history of multiple moles removed   LABORATORY DATA: No results found for this or any previous visit (from the past 48 hour(s)).    RADIOGRAPHIC STUDIES: No results found. referred to CT scan report chest of one year ago   ASSESSMENT: Distant history of breast cancer 24 years ago no evidence of suspicion of recurrence. As asymptomatic gallstones, as primary care followup in place for issues that include high cholesterol and osteoporosis and hypothyroid. Patient is a smoker at high risk of cancer. She's prior been counseled in again today discussed smoking cessation. There is been abnormal small pulmonary nodule noted in August 2012. They were stable in August 2013 scan. Also of very small but minimally larger mediastinal node was noted on the August 2013 scan. Appropriate to rescan now. If stable probably appropriate to scan again in a year to make sure that node is stable for 2 years. Going forward would advise that patient is a smoker at high risk should really have scans every year for 5 years.   PLAN:  1. CT scan of chest noncontrast. If not stable take appropriate action. If stable looks like one-year repeat would be appropriate. 2. Routine followup with clinical exam in one year  3. Patient followed for primary care for multiple issues and also confirmed that she will schedule her own mammogram in October, I offer to make the appointment today and she declined 4. Recommending and referring on to dermatology for evaluation of the lesion on the face plus the mole in the right axilla and 4 skin and mole check for other pigmented lesions   All questions were answered. The patient knows to call the clinic with any problems, questions or concerns. We can certainly see the patient much sooner if  necessary.     Shelby Roberts, MD 04/15/2013 9:49 AM

## 2013-04-20 ENCOUNTER — Other Ambulatory Visit (HOSPITAL_COMMUNITY): Payer: Self-pay | Admitting: Oncology

## 2013-04-21 ENCOUNTER — Ambulatory Visit (HOSPITAL_COMMUNITY)
Admission: RE | Admit: 2013-04-21 | Discharge: 2013-04-21 | Disposition: A | Discharge status: Home / Self Care | Payer: Medicare PPO | Source: Ambulatory Visit | Attending: Internal Medicine | Admitting: Internal Medicine

## 2013-04-21 DIAGNOSIS — J984 Other disorders of lung: Secondary | ICD-10-CM | POA: Insufficient documentation

## 2013-04-21 DIAGNOSIS — R911 Solitary pulmonary nodule: Secondary | ICD-10-CM

## 2013-04-21 DIAGNOSIS — Z853 Personal history of malignant neoplasm of breast: Secondary | ICD-10-CM | POA: Insufficient documentation

## 2013-04-21 DIAGNOSIS — C50912 Malignant neoplasm of unspecified site of left female breast: Secondary | ICD-10-CM

## 2013-05-06 ENCOUNTER — Other Ambulatory Visit (HOSPITAL_COMMUNITY): Payer: Self-pay | Admitting: Family Medicine

## 2013-05-06 DIAGNOSIS — Z139 Encounter for screening, unspecified: Secondary | ICD-10-CM

## 2013-06-08 ENCOUNTER — Ambulatory Visit (HOSPITAL_COMMUNITY)
Admission: RE | Admit: 2013-06-08 | Discharge: 2013-06-08 | Disposition: A | Discharge status: Home / Self Care | Payer: Medicare PPO | Source: Ambulatory Visit | Attending: Family Medicine | Admitting: Family Medicine

## 2013-06-08 DIAGNOSIS — Z139 Encounter for screening, unspecified: Secondary | ICD-10-CM

## 2013-06-08 DIAGNOSIS — Z1231 Encounter for screening mammogram for malignant neoplasm of breast: Secondary | ICD-10-CM | POA: Insufficient documentation

## 2014-04-14 ENCOUNTER — Encounter (HOSPITAL_COMMUNITY): Payer: Medicare PPO | Attending: Hematology and Oncology

## 2014-04-14 ENCOUNTER — Encounter (HOSPITAL_COMMUNITY): Payer: Self-pay

## 2014-04-14 ENCOUNTER — Encounter (HOSPITAL_BASED_OUTPATIENT_CLINIC_OR_DEPARTMENT_OTHER): Payer: Medicare PPO

## 2014-04-14 VITALS — BP 131/60 | HR 75 | Temp 98.3°F | Resp 18 | Wt 144.0 lb

## 2014-04-14 DIAGNOSIS — R911 Solitary pulmonary nodule: Secondary | ICD-10-CM | POA: Insufficient documentation

## 2014-04-14 DIAGNOSIS — Z9010 Acquired absence of unspecified breast and nipple: Secondary | ICD-10-CM | POA: Diagnosis not present

## 2014-04-14 DIAGNOSIS — J4489 Other specified chronic obstructive pulmonary disease: Secondary | ICD-10-CM | POA: Insufficient documentation

## 2014-04-14 DIAGNOSIS — E0500 Thyrotoxicosis with diffuse goiter without thyrotoxic crisis or storm: Secondary | ICD-10-CM | POA: Insufficient documentation

## 2014-04-14 DIAGNOSIS — G988 Other disorders of nervous system: Secondary | ICD-10-CM | POA: Diagnosis not present

## 2014-04-14 DIAGNOSIS — C50919 Malignant neoplasm of unspecified site of unspecified female breast: Secondary | ICD-10-CM

## 2014-04-14 DIAGNOSIS — J449 Chronic obstructive pulmonary disease, unspecified: Secondary | ICD-10-CM | POA: Insufficient documentation

## 2014-04-14 DIAGNOSIS — C50912 Malignant neoplasm of unspecified site of left female breast: Secondary | ICD-10-CM

## 2014-04-14 DIAGNOSIS — K8020 Calculus of gallbladder without cholecystitis without obstruction: Secondary | ICD-10-CM | POA: Insufficient documentation

## 2014-04-14 DIAGNOSIS — Z7982 Long term (current) use of aspirin: Secondary | ICD-10-CM | POA: Insufficient documentation

## 2014-04-14 DIAGNOSIS — F17200 Nicotine dependence, unspecified, uncomplicated: Secondary | ICD-10-CM | POA: Insufficient documentation

## 2014-04-14 LAB — CBC WITH DIFFERENTIAL/PLATELET
BASOS PCT: 1 % (ref 0–1)
Basophils Absolute: 0.1 10*3/uL (ref 0.0–0.1)
Eosinophils Absolute: 0.1 10*3/uL (ref 0.0–0.7)
Eosinophils Relative: 2 % (ref 0–5)
HEMATOCRIT: 45.7 % (ref 36.0–46.0)
HEMOGLOBIN: 15.4 g/dL — AB (ref 12.0–15.0)
LYMPHS ABS: 2.1 10*3/uL (ref 0.7–4.0)
LYMPHS PCT: 26 % (ref 12–46)
MCH: 31.8 pg (ref 26.0–34.0)
MCHC: 33.7 g/dL (ref 30.0–36.0)
MCV: 94.4 fL (ref 78.0–100.0)
MONO ABS: 0.4 10*3/uL (ref 0.1–1.0)
MONOS PCT: 5 % (ref 3–12)
NEUTROS ABS: 5.3 10*3/uL (ref 1.7–7.7)
NEUTROS PCT: 66 % (ref 43–77)
Platelets: 278 10*3/uL (ref 150–400)
RBC: 4.84 MIL/uL (ref 3.87–5.11)
RDW: 13.2 % (ref 11.5–15.5)
WBC: 8 10*3/uL (ref 4.0–10.5)

## 2014-04-14 LAB — COMPREHENSIVE METABOLIC PANEL
ALK PHOS: 100 U/L (ref 39–117)
ALT: 15 U/L (ref 0–35)
AST: 23 U/L (ref 0–37)
Albumin: 4.1 g/dL (ref 3.5–5.2)
Anion gap: 12 (ref 5–15)
BUN: 11 mg/dL (ref 6–23)
CALCIUM: 9.5 mg/dL (ref 8.4–10.5)
CO2: 27 meq/L (ref 19–32)
Chloride: 105 mEq/L (ref 96–112)
Creatinine, Ser: 0.63 mg/dL (ref 0.50–1.10)
GFR calc Af Amer: >90 mL/min (ref >90)
GFR, EST NON AFRICAN AMERICAN: 88 mL/min — AB (ref >90)
GLUCOSE: 87 mg/dL (ref 70–99)
POTASSIUM: 4.1 meq/L (ref 3.7–5.3)
SODIUM: 144 meq/L (ref 137–147)
Total Bilirubin: 0.4 mg/dL (ref 0.3–1.2)
Total Protein: 7.5 g/dL (ref 6.0–8.3)

## 2014-04-14 NOTE — Progress Notes (Signed)
[place]  OFFICE PROGRESS NOTE  Lanette Hampshire, MD [place] [address]  DIAGNOSIS: Adenocarcinoma of breast, left - Plan: CBC with Differential, CEA, Cancer antigen 27.29, Comprehensive metabolic panel, CBC with Differential, CEA, Cancer antigen 27.29, Comprehensive metabolic panel  Lung nodule seen on imaging study  Chief Complaint  Patient presents with  . Follow-up  . Breast Cancer  . Pulmonary nodule    CURRENT THERAPY: Watchful expectation.  INTERVAL HISTORY: [name] 73 y.o. female returns for routine yearly followup. History is prior noted of a breast cancer back in 2000 treated with CMF and 5 years of tamoxifen. It is up to date with primary care. Patient had low back pain which is better, had a CT scan, as asymptomatic gallstones, has no other acute problems. Is due for a mammogram in October. Was last seen one year ago. At that time she had a CT of the chest to followup small pulmonary nodules that turned out to be stable from August 2012 to August 2013. She continues to smoke. She denies any worsening cough, shortness of breath, PND, orthopnea, palpitations, sore throat, lower extremity swelling or redness, nausea, vomiting, diarrhea, constipation, melena, hematochezia, hematuria, vaginal bleeding, significant hot flashes, skin rash, headache, or seizures.    MEDICAL HISTORY: Past Medical History  Diagnosis Date  . Grave's disease   . Nerve compression     pinched in neck  . High triglycerides   . Breast cancer     left breast/mastectomy/dx approx1990  . Adenocarcinoma of left breast 04/16/2012    Stage II (T2 N0 M0) left-sided adenocarcinoma breast diagnosed in 1990 by Dr. Romona Curls and treated by Dr. Juanita Craver on NSAB PPD-19 with CMF followed by 5 years of tamoxifen and she remains disease free thus far.   [name] bladder stones     INTERIM HISTORY: has Adenocarcinoma of left breast on her  problem list.    ALLERGIES:  has No Known Allergies.  MEDICATIONS: has a current medication list which includes the following prescription(s): aspirin, fish oil-omega-3 fatty acids, levothyroxine, meclizine, centrum silver ultra womens, and simvastatin.  SURGICAL HISTORY:  Past Surgical History  Procedure Laterality Date  . Mastectomy      left.  . Carpal tunnel release      rt. hand  . Cataract surgery      bilateral    FAMILY HISTORY: family history includes Cancer in her brother, father, and other.  SOCIAL HISTORY:  reports that she has been smoking.  She has never used smokeless tobacco. She reports that she does not drink alcohol or use illicit drugs.  REVIEW OF SYSTEMS:  Other than that discussed above is noncontributory.  PHYSICAL EXAMINATION: ECOG PERFORMANCE STATUS: 1 - Symptomatic but completely ambulatory  Blood pressure 131/60, pulse 75, temperature 98.3 F (36.8 C), resp. rate 18, weight 144 lb (65.318 kg).  GENERAL:alert, no distress and comfortable SKIN: skin color, texture, turgor are normal, no rashes or significant lesions EYES: PERLA; Conjunctiva are pink and non-injected, sclera clear SINUSES: No redness or tenderness over maxillary or ethmoid sinuses OROPHARYNX:no exudate, no erythema on lips, buccal mucosa, or tongue. NECK: supple, thyroid normal size, non-tender, without nodularity. No masses CHEST: Increased AP diameter with no breast masses. No subcutaneous nodules, status post left mastectomy. LYMPH:  no palpable lymphadenopathy in the cervical, axillary or inguinal LUNGS: clear to auscultation and percussion with normal breathing effort HEART: regular rate &  rhythm and no murmurs. ABDOMEN:abdomen soft, non-tender and normal bowel sounds MUSCULOSKELETAL:no cyanosis of digits and no clubbing. Range of motion normal.  NEURO: alert & oriented x 3 with fluent speech, no focal motor/sensory deficits   LABORATORY DATA: Office Visit on 04/14/2014   Component Date Value Ref Range Status  . WBC 04/14/2014 8.0  4.0 - 10.5 K/uL Final  . RBC 04/14/2014 4.84  3.87 - 5.11 MIL/uL Final  . Hemoglobin 04/14/2014 15.4* 12.0 - 15.0 g/dL Final  . HCT 04/14/2014 45.7  36.0 - 46.0 % Final  . MCV 04/14/2014 94.4  78.0 - 100.0 fL Final  . MCH 04/14/2014 31.8  26.0 - 34.0 pg Final  . MCHC 04/14/2014 33.7  30.0 - 36.0 g/dL Final  . RDW 04/14/2014 13.2  11.5 - 15.5 % Final  . Platelets 04/14/2014 278  150 - 400 K/uL Final  . Neutrophils Relative % 04/14/2014 66  43 - 77 % Final  . Neutro Abs 04/14/2014 5.3  1.7 - 7.7 K/uL Final  . Lymphocytes Relative 04/14/2014 26  12 - 46 % Final  . Lymphs Abs 04/14/2014 2.1  0.7 - 4.0 K/uL Final  . Monocytes Relative 04/14/2014 5  3 - 12 % Final  . Monocytes Absolute 04/14/2014 0.4  0.1 - 1.0 K/uL Final  . Eosinophils Relative 04/14/2014 2  0 - 5 % Final  . Eosinophils Absolute 04/14/2014 0.1  0.0 - 0.7 K/uL Final  . Basophils Relative 04/14/2014 1  0 - 1 % Final  . Basophils Absolute 04/14/2014 0.1  0.0 - 0.1 K/uL Final  . Sodium 04/14/2014 144  137 - 147 mEq/L Final  . Potassium 04/14/2014 4.1  3.7 - 5.3 mEq/L Final  . Chloride 04/14/2014 105  96 - 112 mEq/L Final  . CO2 04/14/2014 27  19 - 32 mEq/L Final  . Glucose, Bld 04/14/2014 87  70 - 99 mg/dL Final  . BUN 04/14/2014 11  6 - 23 mg/dL Final  . Creatinine, Ser 04/14/2014 0.63  0.50 - 1.10 mg/dL Final  . Calcium 04/14/2014 9.5  8.4 - 10.5 mg/dL Final  . Total Protein 04/14/2014 7.5  6.0 - 8.3 g/dL Final  . Albumin 04/14/2014 4.1  3.5 - 5.2 g/dL Final  . AST 04/14/2014 23  0 - 37 U/L Final  . ALT 04/14/2014 15  0 - 35 U/L Final  . Alkaline Phosphatase 04/14/2014 100  39 - 117 U/L Final  . Total Bilirubin 04/14/2014 0.4  0.3 - 1.2 mg/dL Final  . GFR calc non Af Amer 04/14/2014 88* >90 mL/min Final  . GFR calc Af Amer 04/14/2014 >90  >90 mL/min Final   Comment: (NOTE)                          The eGFR has been calculated using the CKD EPI equation.                           This calculation has not been validated in all clinical situations.                          eGFR's persistently <90 mL/min signify possible Chronic Kidney                          Disease.  . Anion gap 04/14/2014 12  5 - 15 Final  PATHOLOGY: No new pathology.  Urinalysis No results found for this basename: colorurine,  appearanceur,  labspec,  phurine,  glucoseu,  hgbur,  bilirubinur,  ketonesur,  proteinur,  urobilinogen,  nitrite,  leukocytesur    RADIOGRAPHIC STUDIES: No results found.  ASSESSMENT:  #1. Remote history of breast cancer 25 years ago, no evidence of disease pending today's lab reports. #2. Pulmonary nodule, stable with last CT scan in August 2014 showing no change. #3. Chronic obstructive pulmonary disease, still smoking.   PLAN:  #1. Advised to stop smoking by decreasing cigarette consumption by one each day until down to 3 or 4 per day. #2. Followup in one year with CBC, chem profile, CEA, and CA 27-29.   All questions were answered. The patient knows to call the clinic with any problems, questions or concerns. We can certainly see the patient much sooner if necessary.   I spent 25 minutes counseling the patient face to face. The total time spent in the appointment was 30 minutes.    Doroteo Bradford, MD 04/14/2014 12:07 PM  DISCLAIMER:  This note was dictated with voice recognition software.  Similar sounding words can inadvertently be transcribed inaccurately and may not be corrected upon review.   nscribed inaccurately and may not be corrected upon review.

## 2014-04-14 NOTE — Patient Instructions (Signed)
..  [place] Discharge Instructions  RECOMMENDATIONS MADE BY THE CONSULTANT AND ANY TEST RESULTS WILL BE SENT TO YOUR REFERRING PHYSICIAN.  EXAM FINDINGS BY THE PHYSICIAN TODAY AND SIGNS OR SYMPTOMS TO REPORT TO CLINIC OR PRIMARY PHYSICIAN: Exam and findings as discussed by Dr. Barnet Glasgow.  Schedule your Mammogram in October Dr. Barnet Glasgow anticipates a CT scan next year. INSTRUCTIONS/FOLLOW-UP: Labs today and return in one year  Thank you for choosing [facility] to provide your oncology and hematology care.  To afford each patient quality time with our providers, please arrive at least 15 minutes before your scheduled appointment time.  With your help, our goal is to use those 15 minutes to complete the necessary work-up to ensure our physicians have the information they need to help with your evaluation and healthcare recommendations.    Effective January 1st, 2014, we ask that you re-schedule your appointment with our physicians should you arrive 10 or more minutes late for your appointment.  We strive to give you quality time with our providers, and arriving late affects you and other patients whose appointments are after yours.    Again, thank you for choosing [facility].  Our hope is that these requests will decrease the amount of time that you wait before being seen by our physicians.       _____________________________________________________________  Should you have questions after your visit to [facility], please contact our office at [phone] between the hours of 8:30 a.m. and 4:30 p.m.  Voicemails left after 4:30 p.m. will not be returned until the following business day.  For prescription refill requests, have your pharmacy contact our office with your prescription refill request.    _______________________________________________________________  We hope that we have given you very good care.  You may receive a  patient satisfaction survey in the mail, please complete it and return it as soon as possible.  We value your feedback!  _______________________________________________________________  Have you asked about our STAR program?  STAR stands for Survivorship Training and Rehabilitation, and this is a nationally recognized cancer care program that focuses on survivorship and rehabilitation.  Cancer and cancer treatments may cause problems, such as, pain, making you feel tired and keeping you from doing the things that you need or want to do. Cancer rehabilitation can help. Our goal is to reduce these troubling effects and help you have the best quality of life possible.  You may receive a survey from a nurse that asks questions about your current state of health.  Based on the survey results, all eligible patients will be referred to the [facility] program for an evaluation so we can better serve you!  A frequently asked questions sheet is available upon request.

## 2014-04-14 NOTE — Progress Notes (Signed)
Labs drawn for [id number]

## 2014-04-15 LAB — CANCER ANTIGEN 27.29: CA 27.29: 27 U/mL (ref 0–39)

## 2014-04-15 LAB — CEA: CEA: 11.8 ng/mL — ABNORMAL HIGH (ref 0.0–5.0)

## 2014-05-07 ENCOUNTER — Other Ambulatory Visit (HOSPITAL_COMMUNITY): Payer: Self-pay | Admitting: Family Medicine

## 2014-05-07 DIAGNOSIS — Z139 Encounter for screening, unspecified: Secondary | ICD-10-CM

## 2014-06-09 ENCOUNTER — Ambulatory Visit (HOSPITAL_COMMUNITY)
Admission: RE | Admit: 2014-06-09 | Discharge: 2014-06-09 | Disposition: A | Discharge status: Home / Self Care | Payer: Medicare PPO | Source: Ambulatory Visit | Attending: Family Medicine | Admitting: Family Medicine

## 2014-06-09 DIAGNOSIS — Z1231 Encounter for screening mammogram for malignant neoplasm of breast: Secondary | ICD-10-CM | POA: Diagnosis present

## 2014-06-09 DIAGNOSIS — Z139 Encounter for screening, unspecified: Secondary | ICD-10-CM

## 2014-06-11 ENCOUNTER — Other Ambulatory Visit: Payer: Self-pay | Admitting: Family Medicine

## 2014-06-11 DIAGNOSIS — R928 Other abnormal and inconclusive findings on diagnostic imaging of breast: Secondary | ICD-10-CM

## 2014-06-29 ENCOUNTER — Encounter (HOSPITAL_COMMUNITY): Payer: Medicare PPO

## 2014-07-06 ENCOUNTER — Ambulatory Visit (HOSPITAL_COMMUNITY)
Admission: RE | Admit: 2014-07-06 | Discharge: 2014-07-06 | Disposition: A | Discharge status: Home / Self Care | Payer: Medicare PPO | Source: Ambulatory Visit | Attending: Family Medicine | Admitting: Family Medicine

## 2014-07-06 ENCOUNTER — Other Ambulatory Visit: Payer: Self-pay | Admitting: Family Medicine

## 2014-07-06 DIAGNOSIS — R928 Other abnormal and inconclusive findings on diagnostic imaging of breast: Secondary | ICD-10-CM | POA: Diagnosis not present

## 2014-11-29 ENCOUNTER — Other Ambulatory Visit (HOSPITAL_COMMUNITY): Payer: Self-pay | Admitting: Family Medicine

## 2014-11-29 DIAGNOSIS — Z09 Encounter for follow-up examination after completed treatment for conditions other than malignant neoplasm: Secondary | ICD-10-CM

## 2015-01-18 ENCOUNTER — Ambulatory Visit (HOSPITAL_COMMUNITY)
Admission: RE | Admit: 2015-01-18 | Discharge: 2015-01-18 | Disposition: A | Discharge status: Home / Self Care | Payer: Medicare PPO | Source: Ambulatory Visit | Attending: Family Medicine | Admitting: Family Medicine

## 2015-01-18 DIAGNOSIS — N6001 Solitary cyst of right breast: Secondary | ICD-10-CM | POA: Diagnosis present

## 2015-01-18 DIAGNOSIS — Z09 Encounter for follow-up examination after completed treatment for conditions other than malignant neoplasm: Secondary | ICD-10-CM | POA: Diagnosis not present

## 2015-01-18 DIAGNOSIS — Z9012 Acquired absence of left breast and nipple: Secondary | ICD-10-CM | POA: Insufficient documentation

## 2015-04-05 ENCOUNTER — Other Ambulatory Visit (HOSPITAL_COMMUNITY): Payer: Self-pay

## 2015-04-05 DIAGNOSIS — C50919 Malignant neoplasm of unspecified site of unspecified female breast: Secondary | ICD-10-CM

## 2015-04-15 ENCOUNTER — Encounter (HOSPITAL_COMMUNITY): Payer: Self-pay | Admitting: Hematology & Oncology

## 2015-04-15 ENCOUNTER — Encounter (HOSPITAL_BASED_OUTPATIENT_CLINIC_OR_DEPARTMENT_OTHER): Payer: Medicare PPO

## 2015-04-15 ENCOUNTER — Encounter (HOSPITAL_COMMUNITY): Payer: Medicare PPO | Attending: Hematology & Oncology | Admitting: Hematology & Oncology

## 2015-04-15 VITALS — BP 147/61 | HR 74 | Temp 98.3°F | Resp 20 | Wt 143.0 lb

## 2015-04-15 DIAGNOSIS — C50919 Malignant neoplasm of unspecified site of unspecified female breast: Secondary | ICD-10-CM | POA: Diagnosis not present

## 2015-04-15 DIAGNOSIS — C50912 Malignant neoplasm of unspecified site of left female breast: Secondary | ICD-10-CM | POA: Diagnosis not present

## 2015-04-15 DIAGNOSIS — R911 Solitary pulmonary nodule: Secondary | ICD-10-CM | POA: Insufficient documentation

## 2015-04-15 DIAGNOSIS — R918 Other nonspecific abnormal finding of lung field: Secondary | ICD-10-CM | POA: Diagnosis not present

## 2015-04-15 LAB — CBC WITH DIFFERENTIAL/PLATELET
Basophils Absolute: 0 10*3/uL (ref 0.0–0.1)
Basophils Relative: 1 % (ref 0–1)
EOS PCT: 2 % (ref 0–5)
Eosinophils Absolute: 0.1 10*3/uL (ref 0.0–0.7)
HEMATOCRIT: 45.2 % (ref 36.0–46.0)
HEMOGLOBIN: 15.3 g/dL — AB (ref 12.0–15.0)
LYMPHS ABS: 2 10*3/uL (ref 0.7–4.0)
LYMPHS PCT: 25 % (ref 12–46)
MCH: 32.3 pg (ref 26.0–34.0)
MCHC: 33.8 g/dL (ref 30.0–36.0)
MCV: 95.4 fL (ref 78.0–100.0)
MONO ABS: 0.4 10*3/uL (ref 0.1–1.0)
MONOS PCT: 4 % (ref 3–12)
Neutro Abs: 5.5 10*3/uL (ref 1.7–7.7)
Neutrophils Relative %: 68 % (ref 43–77)
Platelets: 294 10*3/uL (ref 150–400)
RBC: 4.74 MIL/uL (ref 3.87–5.11)
RDW: 13.6 % (ref 11.5–15.5)
WBC: 8 10*3/uL (ref 4.0–10.5)

## 2015-04-15 LAB — COMPREHENSIVE METABOLIC PANEL
ALBUMIN: 4.4 g/dL (ref 3.5–5.0)
ALK PHOS: 80 U/L (ref 38–126)
ALT: 21 U/L (ref 14–54)
AST: 28 U/L (ref 15–41)
Anion gap: 8 (ref 5–15)
BILIRUBIN TOTAL: 0.6 mg/dL (ref 0.3–1.2)
BUN: 11 mg/dL (ref 6–20)
CO2: 25 mmol/L (ref 22–32)
Calcium: 9.3 mg/dL (ref 8.9–10.3)
Chloride: 108 mmol/L (ref 101–111)
Creatinine, Ser: 0.6 mg/dL (ref 0.44–1.00)
GFR calc Af Amer: >60 mL/min (ref >60)
GFR calc non Af Amer: >60 mL/min (ref >60)
GLUCOSE: 97 mg/dL (ref 65–99)
POTASSIUM: 3.9 mmol/L (ref 3.5–5.1)
Sodium: 141 mmol/L (ref 135–145)
TOTAL PROTEIN: 7.6 g/dL (ref 6.5–8.1)

## 2015-04-15 NOTE — Progress Notes (Signed)
Shelby Hampshire, MD [place] [address]  L breast cancer s/p CMF, mastectomy and tamoxifen Stage II T2N0M0 Tobacco Abuse Intolerance to Chantix Pulmonary Nodules History of R breast cyst  CURRENT THERAPY: Observation  INTERVAL HISTORY: [name] 74 y.o. female returns for  regular  visit for followup of Stage II (T2 N0 M0) left-sided adenocarcinoma breast diagnosed in 1990 by Dr. Romona Curls and treated by Dr. Juanita Craver on NSAB PPD-19 with CMF followed by 5 years of tamoxifen and she remains disease free thus far.    [name] is doing well.  She continues to smoke 1 ppd. She continues to be involved with her church.  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 She gardens and stays active outdoors.  She denies any complaints.   The patient went through a series of mammography/ultrasound at 6 month intervals.  Last mammogram in May of this year showed resolution of a cyst. She is now back to yearly screening. She is still currently smoking, she started at the age of 28.  Her appetite is well.  The patient's primary is Dr. Denton Lank.  Her mastectomy was done by Dr. Romona Curls.  She has no major complaints today.   Past Medical History  Diagnosis Date  . Grave's disease   . Nerve compression     pinched in neck  . High triglycerides   . Breast cancer     left breast/mastectomy/dx approx1990  . Adenocarcinoma of left breast 04/16/2012    Stage II (T2 N0 M0) left-sided adenocarcinoma breast diagnosed in 1990 by Dr. Romona Curls and treated by Dr. Juanita Craver on NSAB PPD-19 with CMF followed by 5 years of tamoxifen and she remains disease free thus far.   [name] bladder stones     has Adenocarcinoma of left breast on her problem list.     has No Known Allergies.  [name] does not currently have medications on file.  Past Surgical History  Procedure Laterality Date  . Mastectomy      left.  . Carpal tunnel release      rt. hand  . Cataract surgery       bilateral    Denies any headaches, dizziness, double vision, fevers, chills, night sweats, nausea, vomiting, diarrhea, constipation, chest pain, heart palpitations, shortness of breath, blood in stool, black tarry stool, urinary pain, urinary burning, urinary frequency, hematuria. 14 point review of systems was performed and is negative except as detailed under history of present illness and above    PHYSICAL EXAMINATION  ECOG PERFORMANCE STATUS: 1 - Symptomatic but completely ambulatory  Filed Vitals:   04/15/15 0846  BP: 147/61  Pulse: 74  Temp: 98.3 F (36.8 C)  Resp: 20    GENERAL:alert, no distress, well nourished, well developed, comfortable, cooperative and smiling, smelling of tobacco smoke. SKIN: skin color, texture, turgor are normal, no rashes or significant lesions HEAD: Normocephalic, No masses, lesions, tenderness or abnormalities EYES: normal, Conjunctiva are pink and non-injected EARS: External ears normal OROPHARYNX:lips, buccal mucosa, and tongue normal and mucous membranes are moist  NECK: supple, no adenopathy, thyroid normal size, non-tender, without nodularity, no stridor, non-tender, trachea midline LYMPH:  no palpable lymphadenopathy, no hepatosplenomegaly BREAST:right breast normal without mass, skin or nipple changes or axillary nodes, left post-mastectomy site well healed and free of suspicious changes no palpable nodularity, well healed. Both axillae are without palpable adenopthy LUNGS: clear to auscultation and percussion, hyperresonant to percussion HEART: regular rate & rhythm,  no murmurs, no gallops, S1 normal and S2 normal ABDOMEN:abdomen soft, non-tender, normal bowel sounds, no masses or organomegaly and no hepatosplenomegaly BACK: Back symmetric, no curvature. EXTREMITIES:less then 2 second capillary refill, no joint deformities, effusion, or inflammation, no edema, no skin discoloration, no clubbing, no cyanosis  NEURO: alert & oriented x 3  with fluent speech, no focal motor/sensory deficits, gait normal   RADIOGRAPHIC STUDIES: CLINICAL DATA: Six-month follow-up for probably benign right breast cystic lesion. History of left mastectomy.  EXAM: DIGITAL DIAGNOSTIC RIGHT MAMMOGRAM WITH CAD  COMPARISON: 06/09/2014 and earlier  ACR Breast Density Category b: There are scattered areas of fibroglandular density.  FINDINGS: There has been interval resolution of small circumscribed nodule medial to the right nipple. No suspicious mass, distortion, or microcalcifications are identified to suggest presence of malignancy.  Mammographic images were processed with CAD.  IMPRESSION: 1. No mammographic evidence for malignancy. 2. Resolved cystic lesion within the 2 o'clock location of the right breast.  RECOMMENDATION: Right screening mammogram is recommended in 1 year.  I have discussed the findings and recommendations with the patient. Results were also provided in writing at the conclusion of the visit. If applicable, a reminder letter will be sent to the patient regarding the next appointment.  BI-RADS CATEGORY 1: Negative.   Electronically Signed  By: Nolon Nations M.D.  On: 01/18/2015 09:04   ASSESSMENT:  1. Stage II (T2 N0 M0) left-sided adenocarcinoma breast diagnosed in 1990 by Dr. Romona Curls and treated by Dr. Juanita Craver on NSAB PPD-19 with CMF followed by 5 years of tamoxifen and she remains disease free thus far.  2. COPD with smoking but she also has rales at the left lower base which do not clear with coughing and she had a greater than 40 pack- year history of smoking.  3. Graves disease diagnosed in 1999 treated I-131 ablation, now on Synthroid  4. Hypertriglyceridemia on therapy. 5. Pulmonary Nodules 6. R breast cyst, now resolved  He is up-to-date in regards to mammography. Dr. Denton Lank orders her mammograms and she will be due again next May. She has no complaints today and is  doing well. She remains active.  We discussed smoking cessation in detail. She notes she tried to quit in the past with Chantix but states it made her feel "unlike herself" she was not really interested in discussing other methods to quit.  We discussed her prior CT imaging of the chest with pulmonary nodules. Given her smoking history we have opted to proceed with another noncontrasted CT. I advised her we will call her with the results. We will arrange for this the first week of September.  We will call the patient with her blood work results when available. She will return in one year with repeat exam   All questions were answered. The patient knows to call the clinic with any problems, questions or concerns. We can certainly see the patient much sooner if necessary.   This document serves as a record of services personally performed by Ancil Linsey, MD. It was created on her behalf by Janace Hoard, a trained medical scribe. The creation of this record is based on the scribe's personal observations and the provider's statements to them. This document has been checked and approved by the attending provider.  I have reviewed the above documentation for accuracy and completeness, and I agree with the above.  This note was electronically signed.  Kelby Fam. Whitney Muse, MD

## 2015-04-15 NOTE — Progress Notes (Signed)
Labs performed today.

## 2015-04-15 NOTE — Patient Instructions (Signed)
..  [place] at [facility] Discharge Instructions  RECOMMENDATIONS MADE BY THE CONSULTANT AND ANY TEST RESULTS WILL BE SENT TO YOUR REFERRING PHYSICIAN.  You will have a CT scan the first week of September, and we will call you with the results.  You will return to [place] in one year for follow up appointment.  Please see schedule for appointments.     Thank you for choosing [place] at [facility] to provide your oncology and hematology care.  To afford each patient quality time with our provider, please arrive at least 15 minutes before your scheduled appointment time.    You need to re-schedule your appointment should you arrive 10 or more minutes late.  We strive to give you quality time with our providers, and arriving late affects you and other patients whose appointments are after yours.  Also, if you no show three or more times for appointments you may be dismissed from the clinic at the providers discretion.     Again, thank you for choosing [facility].  Our hope is that these requests will decrease the amount of time that you wait before being seen by our physicians.       _____________________________________________________________  Should you have questions after your visit to [facility], please contact our office at [phone] between the hours of 8:30 a.m. and 4:30 p.m.  Voicemails left after 4:30 p.m. will not be returned until the following business day.  For prescription refill requests, have your pharmacy contact our office.

## 2015-04-16 LAB — CEA: CEA: 20.7 ng/mL — AB (ref 0.0–4.7)

## 2015-04-16 LAB — CANCER ANTIGEN 27.29: CA 27.29: 25.4 U/mL (ref 0.0–38.6)

## 2015-05-10 ENCOUNTER — Ambulatory Visit (HOSPITAL_COMMUNITY)
Admission: RE | Admit: 2015-05-10 | Discharge: 2015-05-10 | Disposition: A | Discharge status: Home / Self Care | Payer: Medicare PPO | Source: Ambulatory Visit | Attending: Hematology & Oncology | Admitting: Hematology & Oncology

## 2015-05-10 DIAGNOSIS — Z853 Personal history of malignant neoplasm of breast: Secondary | ICD-10-CM | POA: Insufficient documentation

## 2015-05-10 DIAGNOSIS — R911 Solitary pulmonary nodule: Secondary | ICD-10-CM | POA: Insufficient documentation

## 2015-05-10 DIAGNOSIS — C50919 Malignant neoplasm of unspecified site of unspecified female breast: Secondary | ICD-10-CM

## 2015-05-10 DIAGNOSIS — D3502 Benign neoplasm of left adrenal gland: Secondary | ICD-10-CM | POA: Insufficient documentation

## 2015-05-10 DIAGNOSIS — R918 Other nonspecific abnormal finding of lung field: Secondary | ICD-10-CM

## 2015-05-30 ENCOUNTER — Other Ambulatory Visit (HOSPITAL_COMMUNITY): Payer: Self-pay | Admitting: Family Medicine

## 2015-05-30 DIAGNOSIS — M8580 Other specified disorders of bone density and structure, unspecified site: Secondary | ICD-10-CM

## 2015-05-30 DIAGNOSIS — M810 Age-related osteoporosis without current pathological fracture: Secondary | ICD-10-CM

## 2015-06-02 ENCOUNTER — Ambulatory Visit (HOSPITAL_COMMUNITY)
Admission: RE | Admit: 2015-06-02 | Discharge: 2015-06-02 | Disposition: A | Discharge status: Home / Self Care | Payer: Medicare PPO | Source: Ambulatory Visit | Attending: Family Medicine | Admitting: Family Medicine

## 2015-06-02 DIAGNOSIS — Z853 Personal history of malignant neoplasm of breast: Secondary | ICD-10-CM | POA: Insufficient documentation

## 2015-06-02 DIAGNOSIS — F17200 Nicotine dependence, unspecified, uncomplicated: Secondary | ICD-10-CM | POA: Insufficient documentation

## 2015-06-02 DIAGNOSIS — Z780 Asymptomatic menopausal state: Secondary | ICD-10-CM | POA: Insufficient documentation

## 2015-06-02 DIAGNOSIS — M85862 Other specified disorders of bone density and structure, left lower leg: Secondary | ICD-10-CM | POA: Insufficient documentation

## 2015-06-02 DIAGNOSIS — M810 Age-related osteoporosis without current pathological fracture: Secondary | ICD-10-CM

## 2015-06-02 DIAGNOSIS — M8588 Other specified disorders of bone density and structure, other site: Secondary | ICD-10-CM | POA: Diagnosis present

## 2015-06-02 DIAGNOSIS — M8580 Other specified disorders of bone density and structure, unspecified site: Secondary | ICD-10-CM

## 2015-12-15 ENCOUNTER — Other Ambulatory Visit (HOSPITAL_COMMUNITY): Payer: Self-pay | Admitting: Internal Medicine

## 2015-12-15 DIAGNOSIS — Z1231 Encounter for screening mammogram for malignant neoplasm of breast: Secondary | ICD-10-CM

## 2016-01-20 ENCOUNTER — Ambulatory Visit (HOSPITAL_COMMUNITY)
Admission: RE | Admit: 2016-01-20 | Discharge: 2016-01-20 | Disposition: A | Discharge status: Home / Self Care | Payer: Medicare PPO | Source: Ambulatory Visit | Attending: Internal Medicine | Admitting: Internal Medicine

## 2016-01-20 DIAGNOSIS — Z1231 Encounter for screening mammogram for malignant neoplasm of breast: Secondary | ICD-10-CM | POA: Diagnosis not present

## 2016-04-13 ENCOUNTER — Encounter (HOSPITAL_BASED_OUTPATIENT_CLINIC_OR_DEPARTMENT_OTHER): Payer: Medicare PPO | Admitting: Adult Health

## 2016-04-13 ENCOUNTER — Encounter (HOSPITAL_COMMUNITY): Payer: Self-pay | Admitting: Adult Health

## 2016-04-13 ENCOUNTER — Encounter (HOSPITAL_COMMUNITY): Payer: Medicare PPO | Attending: Hematology & Oncology

## 2016-04-13 VITALS — BP 141/53 | HR 76 | Temp 98.4°F | Resp 16 | Wt 143.5 lb

## 2016-04-13 DIAGNOSIS — Z853 Personal history of malignant neoplasm of breast: Secondary | ICD-10-CM | POA: Diagnosis not present

## 2016-04-13 DIAGNOSIS — R918 Other nonspecific abnormal finding of lung field: Secondary | ICD-10-CM

## 2016-04-13 DIAGNOSIS — J449 Chronic obstructive pulmonary disease, unspecified: Secondary | ICD-10-CM | POA: Diagnosis not present

## 2016-04-13 DIAGNOSIS — C50919 Malignant neoplasm of unspecified site of unspecified female breast: Secondary | ICD-10-CM

## 2016-04-13 DIAGNOSIS — Z720 Tobacco use: Secondary | ICD-10-CM

## 2016-04-13 DIAGNOSIS — Z1231 Encounter for screening mammogram for malignant neoplasm of breast: Secondary | ICD-10-CM

## 2016-04-13 DIAGNOSIS — E781 Pure hyperglyceridemia: Secondary | ICD-10-CM

## 2016-04-13 DIAGNOSIS — E0500 Thyrotoxicosis with diffuse goiter without thyrotoxic crisis or storm: Secondary | ICD-10-CM | POA: Diagnosis not present

## 2016-04-13 LAB — COMPREHENSIVE METABOLIC PANEL
ALBUMIN: 4.5 g/dL (ref 3.5–5.0)
ALT: 18 U/L (ref 14–54)
ANION GAP: 7 (ref 5–15)
AST: 23 U/L (ref 15–41)
Alkaline Phosphatase: 86 U/L (ref 38–126)
BUN: 12 mg/dL (ref 6–20)
CO2: 26 mmol/L (ref 22–32)
Calcium: 9 mg/dL (ref 8.9–10.3)
Chloride: 106 mmol/L (ref 101–111)
Creatinine, Ser: 0.66 mg/dL (ref 0.44–1.00)
GFR calc Af Amer: >60 mL/min (ref >60)
Glucose, Bld: 105 mg/dL — ABNORMAL HIGH (ref 65–99)
POTASSIUM: 4.1 mmol/L (ref 3.5–5.1)
Sodium: 139 mmol/L (ref 135–145)
Total Bilirubin: 0.6 mg/dL (ref 0.3–1.2)
Total Protein: 7.8 g/dL (ref 6.5–8.1)

## 2016-04-13 LAB — CBC WITH DIFFERENTIAL/PLATELET
BASOS PCT: 1 %
Basophils Absolute: 0 10*3/uL (ref 0.0–0.1)
EOS ABS: 0.2 10*3/uL (ref 0.0–0.7)
Eosinophils Relative: 3 %
HEMATOCRIT: 47.4 % — AB (ref 36.0–46.0)
Hemoglobin: 15.9 g/dL — ABNORMAL HIGH (ref 12.0–15.0)
LYMPHS ABS: 1.9 10*3/uL (ref 0.7–4.0)
Lymphocytes Relative: 22 %
MCH: 31.5 pg (ref 26.0–34.0)
MCHC: 33.5 g/dL (ref 30.0–36.0)
MCV: 93.9 fL (ref 78.0–100.0)
MONO ABS: 0.5 10*3/uL (ref 0.1–1.0)
Monocytes Relative: 6 %
NEUTROS ABS: 5.8 10*3/uL (ref 1.7–7.7)
NEUTROS PCT: 68 %
Platelets: 290 10*3/uL (ref 150–400)
RBC: 5.05 MIL/uL (ref 3.87–5.11)
RDW: 13.7 % (ref 11.5–15.5)
WBC: 8.4 10*3/uL (ref 4.0–10.5)

## 2016-04-13 NOTE — Progress Notes (Signed)
[place] [facility] [place], MD [address] / [place] [address]  L breast cancer s/p CMF, mastectomy and tamoxifen Stage II T2N0M0 Tobacco Abuse Intolerance to Chantix Pulmonary Nodules History of R breast cyst  CURRENT THERAPY: Observation  HISTORY OF PRESENT ILLNESS: (from previous visit 04/15/15) [name] 75 y.o. female returns for  regular  visit for followup of Stage II (T2 N0 M0) left-sided adenocarcinoma breast diagnosed in 1990 by Dr. Romona Tyler and treated by Dr. Juanita Tyler on NSAB PPD-19 with CMF followed by 5 years of tamoxifen and she remains disease free thus far.    [name] is doing well.  She continues to smoke 1 ppd. She continues to be involved with her church.  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 She gardens and stays active outdoors.  She denies any complaints.   The patient went through a series of mammography/ultrasound at 6 month intervals.  Last mammogram in May of this year showed resolution of a cyst. She is now back to yearly screening. She is still currently smoking, she started at the age of 87.  Her appetite is well.  The patient's primary is Dr. Denton Tyler.  Her mastectomy was done by Dr. Romona Tyler.  She has no major complaints today.   INTERVAL HISTORY (04/13/16):   [name] returns to the [place] today unaccompanied.  She says she doesn't have any complaints, "for an old woman." She notes that her energy levels have been good and denies any new cough or SOB. She says she has dizziness but that this is due to her vertigo, and is per her norm. She denies any new bone pains, or joint aches and pains. When asked about nausea and vomiting, belly pain, diarrhea, constipation, and urination, she denies any complaints. She also denies any blood in her stool or urine.  In terms of smoking cessation, she says it's "not going too good." She says she smoking a half a pack to a pack a day, and  notes that she tries to cut down a little as she goes. When asked if she's ready to quit smoking, she remarks "I don't know." She says sometimes she thinks she's ready to stop, but then again, she isn't sure. 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  She doesn't want to try the pill again (Chantix), noting "that's a mind controller."  Her PCP is [name]. She receives her mammograms here at [name], but [name] orders these; she confirms that her last mammogram was in May.    During her physical exam today, she received a breast exam. She confirms that she took tamoxifen for 5 years after treatment.   Past Medical History:  Diagnosis Date  . Adenocarcinoma of left breast 04/16/2012   Stage II (T2 N0 M0) left-sided adenocarcinoma breast diagnosed in 1990 by Dr. Romona Tyler and treated by Dr. Juanita Tyler on NSAB PPD-19 with CMF followed by 5 years of tamoxifen and she remains disease free thus far.   . Breast cancer ([place])    left breast/mastectomy/dx approx1990  . Gall bladder stones   . Grave's  disease   . High triglycerides   . Nerve compression    pinched in neck    has Adenocarcinoma of left breast on her problem list.     has No Known Allergies.  Current Outpatient Prescriptions on File Prior to Visit  Medication Sig Dispense Refill  . aspirin 325 MG tablet Take 325 mg by mouth as needed.      . fish oil-omega-3 fatty acids 1000 MG capsule Take 1,200 mg by mouth daily. Takes 2 daily    . levothyroxine (SYNTHROID, LEVOTHROID) 112 MCG tablet Take 112 mcg by mouth daily.      . meclizine (ANTIVERT) 50 MG tablet Take 25 mg by mouth 3 (three) times daily as needed.      . Multiple Vitamins-Minerals (CENTRUM SILVER  ULTRA WOMENS) TABS Take by mouth daily.      . simvastatin (ZOCOR) 20 MG tablet Take 20 mg by mouth at bedtime.       No current facility-administered medications on file prior to visit.     Past Surgical History:  Procedure Laterality Date  . CARPAL TUNNEL RELEASE     rt. hand  . cataract surgery     bilateral  . MASTECTOMY     left.   REVIEW OF SYSTEMS:  Denies any headaches, dizziness, double vision, fevers, chills, night sweats, nausea, vomiting, diarrhea, constipation, chest pain, heart palpitations, shortness of breath, blood in stool, black tarry stool, urinary pain, urinary burning, urinary frequency, hematuria.  14 point review of systems was performed and is negative except as detailed under history of present illness/interval history and above    PHYSICAL EXAMINATION  ECOG PERFORMANCE STATUS: 1 - Symptomatic but completely ambulatory  Vitals:   04/13/16 0948  BP: (!) 141/53  Pulse: 76  Resp: 16  Temp: 98.4 F (36.9 C)    GENERAL:alert, no distress, well nourished, well developed, comfortable, cooperative and smiling, smelling of tobacco smoke. SKIN: skin color, texture, turgor are normal, no rashes or significant lesions HEAD: Normocephalic EYES: normal, Conjunctiva are pink; sclera anicteric. Pupils equal and reactive to light.  EARS: External ears normal OROPHARYNX:lips, buccal mucosa, and tongue normal and mucous membranes are moist  NECK: supple, no adenopathy, thyroid normal size, non-tender, without nodularity, no stridor, non-tender, trachea midline LYMPH:  no palpable lymphadenopathy BREAST:  (L) chest wall-s/p mastectomy; chest wall without nodularity or masses; mastectomy scar well-healed without nodularity or erythema.  No skin erythema or puckering.  (R) breast-No palpable masses; She does have several large, raised moles over her chest bilaterally, but these are not concerning for breast cancer.  Right nipple without discharge or inversion.    Axilla-No palpable adenopathy bilaterally.  LUNGS: clear to auscultation and percussion, hyperresonant to percussion HEART: regular rate & rhythm, no murmurs, no gallops, S1 normal and S2 normal ABDOMEN:abdomen soft, non-tender, normal bowel sounds, no masses or organomegaly and no hepatosplenomegaly EXTREMITIES:  no edema NEURO: alert & oriented x 3 with fluent speech, no focal motor/sensory deficits, gait normal   RADIOGRAPHIC STUDIES:  CT Chest Wo Contrast (Accession [id number]) (Order [id number])  Imaging  Date: 05/10/2015 Department: [facility] Released By: Alanda Slim Authorizing: Patrici Ranks, MD  PACS Images   Show images for CT Chest Wo Contrast  Study Result   CLINICAL DATA:  Followup pulmonary nodule  EXAM: CT CHEST WITHOUT CONTRAST  TECHNIQUE: Multidetector CT imaging of the chest was performed following the standard protocol without IV contrast.  COMPARISON:  04/21/2013  FINDINGS: Mediastinum:  Normal heart size. There is no pericardial effusion identified. Aortic atherosclerosis. No mediastinal or hilar adenopathy.The trachea is patent and appears midline. Normal appearance of the esophagus.  Lungs/Pleura: No pleural effusion identified. There is no airspace consolidation or atelectasis. Mild changes of paraseptal emphysema. Tiny left upper lobe lung nodule is unchanged from previous exam, image number 22 of series 3. Also stable is a 3 mm left upper lobe lung nodule, image 20/series 3. 4 mm perifissural nodule in the right middle lobe is stable from previous exam, image 33/series 3.  Upper Abdomen: The visualized portions of the liver and spleen are unremarkable. There is low-attenuation enlargement of the left adrenal gland which is stable from previous exam.  Musculoskeletal: No aggressive lytic or sclerotic bone lesions identified.  IMPRESSION: 1. Stable small pulmonary nodules compared with 04/21/2013 and report from  04/24/2011 compatible with a benign process. No further followup indicated. 2. No acute cardiopulmonary abnormalities. 3. Left adrenal adenoma.   Electronically Signed   By: Kerby Moors M.D.   On: 05/10/2015 13:40   Result Notes   Notes Recorded by Joice Lofts, RN on 05/19/2015 at 9:44 AM Patient notified ------  Notes Recorded by Patrici Ranks, MD on 05/18/2015 at 4:48 PM Advise pulmonary nodules are stable. No further CT scans are needed!. ShelbyP    CLINICAL DATA: Six-month follow-up for probably benign right breast cystic lesion. History of left mastectomy.  EXAM: DIGITAL DIAGNOSTIC RIGHT MAMMOGRAM WITH CAD  COMPARISON: 06/09/2014 and earlier  ACR Breast Density Category b: There are scattered areas of fibroglandular density.  FINDINGS: There has been interval resolution of small circumscribed nodule medial to the right nipple. No suspicious mass, distortion, or microcalcifications are identified to suggest presence of malignancy.  Mammographic images were processed with CAD.  IMPRESSION: 1. No mammographic evidence for malignancy. 2. Resolved cystic lesion within the 2 o'clock location of the right breast.  RECOMMENDATION: Right screening mammogram is recommended in 1 year.  I have discussed the findings and recommendations with the patient. Results were also provided in writing at the conclusion of the visit. If applicable, a reminder letter will be sent to the patient regarding the next appointment.  BI-RADS CATEGORY 1: Negative.   Electronically Signed  By: Nolon Nations M.D.  On: 01/18/2015 09:04   ASSESSMENT:  1. Stage II (T2 N0 M0) left-sided adenocarcinoma breast diagnosed in 1990 by Dr. Romona Tyler and treated by Dr. Juanita Tyler on NSAB PPD-19 with CMF followed by 5 years of tamoxifen and she remains disease free thus far.  2. COPD with smoking but she also has rales at the left lower base which do not clear with  coughing and she had a greater than 40 pack- year history of smoking.  3. Graves disease diagnosed in 1999 treated I-131 ablation, now on Synthroid  4. Hypertriglyceridemia on therapy. 5. Pulmonary Nodules 6. R breast cyst, now resolved  He is up-to-date in regards to mammography. Dr. Denton Tyler orders her mammograms and she will be due again next May. She has no complaints today and is doing well. She remains active.  We discussed smoking cessation in detail. She notes she tried to quit in the past with Chantix but states it made her feel "unlike herself" she was not really interested in discussing other methods to quit.  We discussed her prior CT imaging of the chest with pulmonary nodules. Given her smoking history we have opted to proceed with another noncontrasted CT. I advised her we will call her  with the results. We will arrange for this the first week of September.  We will call the patient with her blood work results when available. She will return in one year with repeat exam    ASSESSMENT & PLAN (04/13/16):   -Upon reviewing her chart, it looks like [name] has not had her mammogram for 2017.  Her PCP generally orders them, but she tells me that Dr. Denton Tyler has been out of the office and now Dr. Jani Tyler is her new PCP.  Her mammogram in 2016 showed a resolved cyst in the (R) breast and annual screening mammogram was recommended going forward. She will need unilateral (R) breast screening mammogram only.  Since she is overdue for her mammogram, I will place orders today.    -Today the patient was advised about options for smoking cessation. We discussed ways to distract her from the urge to pick up a cigarette, including intentionally pausing, doing a crossword or artwork whenever she has the urge to smoke.  Keeping her hands occupied may be helpful in continued efforts for smoking cessation.. She knows that the patches and prescriptions are available, should she need them either  through [place] or the 1-800-QUIT-NOW hotline.    We also discussed re-imaging the lung nodules (last CT was in 05/2015).  We discussed that continuing to monitor these nodules over time will be important, particularly given her smoking history.  A low-dose/non-contrasted CT chest would be appropriate for her annually anyway for lung cancer screening.  She agreed to have a repeat CT chest in 05/2016; she prefers to be called with the results of the CT scan, as she lives in [place] and prefers not to have to drive back to [place] for another appointment.     -Follow-up in 04/2017 with Dr. Whitney Muse for annual clinical breast exam with labs prior to this visit; orders placed for labs today.    Orders Placed This Encounter  Procedures  . CT CHEST WO CONTRAST    Standing Status:   Future    Standing Expiration Date:   06/13/2017    Order Specific Question:   Reason for Exam (SYMPTOM  OR DIAGNOSIS REQUIRED)    Answer:   lung nodules    Comments:   chronic lung nodules; history of breast cancer; current smoker; lung cancer screening     Order Specific Question:   Preferred imaging location?    Answer:   [facility]    Standing Status:   Future    Standing Expiration Date:   04/13/2017    Order Specific Question:   Reason for Exam (SYMPTOM  OR DIAGNOSIS REQUIRED)    Answer:   breast cancer    Comments:   annual exam; history of (L) breast cancer    Order Specific Question:   Preferred imaging location?    Answer:   [facility]  . CBC with Differential/Platelet    Standing Status:   Future    Standing Expiration Date:   10/14/2017  . Comprehensive metabolic panel    Standing Status:   Future    Standing Expiration Date:   10/14/2017    Order Specific Question:   Has the patient fasted?    Answer:   No     All questions were answered. The patient knows to call the clinic with any problems, questions or concerns. We can certainly see the patient much  sooner if necessary.  This document serves  as a record of services personally performed by Mike Craze, NP. It was created on her behalf by Toni Amend, a trained medical scribe. The creation of this record is based on the scribe's personal observations and the provider's statements to them. This document has been checked and approved by the attending provider.  I have reviewed the above documentation for accuracy and completeness, and I agree with the above.  This note was electronically signed by:  Mike Craze, NP 04/13/16

## 2016-04-14 LAB — CEA: CEA: 20 ng/mL — ABNORMAL HIGH (ref 0.0–4.7)

## 2016-04-14 LAB — CANCER ANTIGEN 27.29: CA 27.29: 28.5 U/mL (ref 0.0–38.6)

## 2016-05-15 ENCOUNTER — Ambulatory Visit (HOSPITAL_COMMUNITY)
Admission: RE | Admit: 2016-05-15 | Discharge: 2016-05-15 | Disposition: A | Discharge status: Home / Self Care | Payer: Medicare PPO | Source: Ambulatory Visit | Attending: Adult Health | Admitting: Adult Health

## 2016-05-15 DIAGNOSIS — C50919 Malignant neoplasm of unspecified site of unspecified female breast: Secondary | ICD-10-CM

## 2016-05-15 DIAGNOSIS — M47894 Other spondylosis, thoracic region: Secondary | ICD-10-CM | POA: Insufficient documentation

## 2016-05-15 DIAGNOSIS — R918 Other nonspecific abnormal finding of lung field: Secondary | ICD-10-CM | POA: Diagnosis not present

## 2016-05-15 DIAGNOSIS — D3502 Benign neoplasm of left adrenal gland: Secondary | ICD-10-CM | POA: Insufficient documentation

## 2016-05-15 DIAGNOSIS — I700 Atherosclerosis of aorta: Secondary | ICD-10-CM | POA: Insufficient documentation

## 2016-05-15 DIAGNOSIS — I2510 Atherosclerotic heart disease of native coronary artery without angina pectoris: Secondary | ICD-10-CM | POA: Insufficient documentation

## 2016-05-15 DIAGNOSIS — R911 Solitary pulmonary nodule: Secondary | ICD-10-CM | POA: Diagnosis not present

## 2016-05-16 ENCOUNTER — Telehealth (HOSPITAL_COMMUNITY): Payer: Self-pay | Admitting: *Deleted

## 2016-05-16 NOTE — Telephone Encounter (Signed)
-----   Message from Baird Cancer, PA-C sent at 05/15/2016  5:20 PM EDT ----- Stable

## 2016-05-16 NOTE — Telephone Encounter (Signed)
Pt aware that chest Ct was stable.

## 2016-05-29 DIAGNOSIS — M8580 Other specified disorders of bone density and structure, unspecified site: Secondary | ICD-10-CM | POA: Diagnosis not present

## 2016-05-29 DIAGNOSIS — E039 Hypothyroidism, unspecified: Secondary | ICD-10-CM | POA: Diagnosis not present

## 2016-05-29 DIAGNOSIS — E782 Mixed hyperlipidemia: Secondary | ICD-10-CM | POA: Diagnosis not present

## 2016-06-01 DIAGNOSIS — Z7982 Long term (current) use of aspirin: Secondary | ICD-10-CM | POA: Diagnosis not present

## 2016-06-01 DIAGNOSIS — E785 Hyperlipidemia, unspecified: Secondary | ICD-10-CM | POA: Diagnosis not present

## 2016-06-01 DIAGNOSIS — E069 Thyroiditis, unspecified: Secondary | ICD-10-CM | POA: Diagnosis not present

## 2016-06-01 DIAGNOSIS — F17210 Nicotine dependence, cigarettes, uncomplicated: Secondary | ICD-10-CM | POA: Diagnosis not present

## 2016-10-02 DIAGNOSIS — R739 Hyperglycemia, unspecified: Secondary | ICD-10-CM | POA: Diagnosis not present

## 2016-10-02 DIAGNOSIS — C50919 Malignant neoplasm of unspecified site of unspecified female breast: Secondary | ICD-10-CM | POA: Diagnosis not present

## 2016-10-02 DIAGNOSIS — E034 Atrophy of thyroid (acquired): Secondary | ICD-10-CM | POA: Diagnosis not present

## 2016-10-02 DIAGNOSIS — F17210 Nicotine dependence, cigarettes, uncomplicated: Secondary | ICD-10-CM | POA: Diagnosis not present

## 2016-10-02 DIAGNOSIS — E785 Hyperlipidemia, unspecified: Secondary | ICD-10-CM | POA: Diagnosis not present

## 2016-10-02 DIAGNOSIS — E039 Hypothyroidism, unspecified: Secondary | ICD-10-CM | POA: Diagnosis not present

## 2017-01-21 ENCOUNTER — Other Ambulatory Visit (HOSPITAL_COMMUNITY): Payer: Self-pay | Admitting: Adult Health

## 2017-01-21 ENCOUNTER — Ambulatory Visit (HOSPITAL_COMMUNITY): Payer: Medicare PPO

## 2017-01-21 ENCOUNTER — Ambulatory Visit (HOSPITAL_COMMUNITY)
Admission: RE | Admit: 2017-01-21 | Discharge: 2017-01-21 | Disposition: A | Discharge status: Home / Self Care | Payer: Medicare PPO | Source: Ambulatory Visit | Attending: Adult Health | Admitting: Adult Health

## 2017-01-21 DIAGNOSIS — Z1231 Encounter for screening mammogram for malignant neoplasm of breast: Secondary | ICD-10-CM | POA: Diagnosis not present

## 2017-04-15 ENCOUNTER — Encounter (HOSPITAL_COMMUNITY): Payer: Medicare PPO | Attending: Oncology | Admitting: Oncology

## 2017-04-15 ENCOUNTER — Encounter (HOSPITAL_COMMUNITY): Payer: Self-pay

## 2017-04-15 ENCOUNTER — Encounter (HOSPITAL_COMMUNITY): Payer: Medicare PPO

## 2017-04-15 VITALS — BP 140/57 | HR 71 | Resp 16 | Ht 65.5 in | Wt 132.0 lb

## 2017-04-15 DIAGNOSIS — Z853 Personal history of malignant neoplasm of breast: Secondary | ICD-10-CM

## 2017-04-15 DIAGNOSIS — Z720 Tobacco use: Secondary | ICD-10-CM

## 2017-04-15 DIAGNOSIS — C50919 Malignant neoplasm of unspecified site of unspecified female breast: Secondary | ICD-10-CM | POA: Diagnosis not present

## 2017-04-15 DIAGNOSIS — R918 Other nonspecific abnormal finding of lung field: Secondary | ICD-10-CM

## 2017-04-15 LAB — CBC WITH DIFFERENTIAL/PLATELET
BASOS ABS: 0 10*3/uL (ref 0.0–0.1)
BASOS PCT: 0 %
EOS PCT: 2 %
Eosinophils Absolute: 0.2 10*3/uL (ref 0.0–0.7)
HEMATOCRIT: 45.7 % (ref 36.0–46.0)
Hemoglobin: 15.3 g/dL — ABNORMAL HIGH (ref 12.0–15.0)
Lymphocytes Relative: 23 %
Lymphs Abs: 2.1 10*3/uL (ref 0.7–4.0)
MCH: 31.7 pg (ref 26.0–34.0)
MCHC: 33.5 g/dL (ref 30.0–36.0)
MCV: 94.8 fL (ref 78.0–100.0)
MONO ABS: 0.4 10*3/uL (ref 0.1–1.0)
MONOS PCT: 5 %
NEUTROS ABS: 6.3 10*3/uL (ref 1.7–7.7)
Neutrophils Relative %: 70 %
PLATELETS: 282 10*3/uL (ref 150–400)
RBC: 4.82 MIL/uL (ref 3.87–5.11)
RDW: 13.5 % (ref 11.5–15.5)
WBC: 9 10*3/uL (ref 4.0–10.5)

## 2017-04-15 LAB — COMPREHENSIVE METABOLIC PANEL
ALBUMIN: 4.3 g/dL (ref 3.5–5.0)
ALT: 14 U/L (ref 14–54)
ANION GAP: 11 (ref 5–15)
AST: 21 U/L (ref 15–41)
Alkaline Phosphatase: 82 U/L (ref 38–126)
BILIRUBIN TOTAL: 0.7 mg/dL (ref 0.3–1.2)
BUN: 10 mg/dL (ref 6–20)
CHLORIDE: 103 mmol/L (ref 101–111)
CO2: 28 mmol/L (ref 22–32)
Calcium: 9.8 mg/dL (ref 8.9–10.3)
Creatinine, Ser: 0.71 mg/dL (ref 0.44–1.00)
GFR calc Af Amer: >60 mL/min (ref >60)
Glucose, Bld: 109 mg/dL — ABNORMAL HIGH (ref 65–99)
POTASSIUM: 4.5 mmol/L (ref 3.5–5.1)
Sodium: 142 mmol/L (ref 135–145)
TOTAL PROTEIN: 7.7 g/dL (ref 6.5–8.1)

## 2017-04-15 NOTE — Progress Notes (Signed)
[facility] NOTE  Jani Gravel, MD [address] / [place] [address]  L breast cancer s/p CMF, mastectomy and tamoxifen Stage II T2N0M0 Tobacco Abuse Intolerance to [place] Pulmonary Nodules History of R breast cyst  CURRENT THERAPY: Observation  HISTORY OF PRESENT ILLNESS: (from previous visit 04/15/15) Fredric Mare 76 y.o. female returns for  regular  visit for followup of Stage II (T2 N0 M0) left-sided adenocarcinoma breast diagnosed in 1990 by Dr. Romona Curls and treated by Dr. Juanita Craver on NSAB PPD-19 with CMF followed by 5 years of tamoxifen and she remains disease free thus far.    Florean is doing well.  She continues to smoke 1 ppd. She continues to be involved with her church.  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 She gardens and stays active outdoors.  She denies any complaints.   The patient went through a series of mammography/ultrasound at 6 month intervals.  Last mammogram in May of this year showed resolution of a cyst. She is now back to yearly screening. She is still currently smoking, she started at the age of 87.  Her appetite is well.  The patient's primary is Dr. Denton Lank.  Her mastectomy was done by Dr. Romona Curls.  She has no major complaints today.   INTERVAL HISTORY:   Ms. Buccieri returns to the [facility] today unaccompanied. She states that overall she has been doing well. She had a mammogram of her right breast in May 2018 which was negative for any evidence of malignancy. She has not palpated any masses in her right breast or left chest wall. She continues smoke 1 pack per day. Denies any chest pain, shortness breath, abdominal pain, recent infections, focal weakness.  Past Medical History:  Diagnosis Date  . Adenocarcinoma of left breast 04/16/2012   Stage II (T2 N0 M0) left-sided adenocarcinoma breast diagnosed in 1990 by Dr. Romona Curls and treated by Dr. Juanita Craver on NSAB PPD-19  with CMF followed by 5 years of tamoxifen and she remains disease free thus far.   . Breast cancer ([place])    left breast/mastectomy/dx approx1990  . Gall bladder stones   . Grave's disease   . High triglycerides   . Nerve compression    pinched in neck    has Adenocarcinoma of left breast on her problem list.     has No Known Allergies.  Current Outpatient Prescriptions on File Prior to Visit  Medication Sig Dispense Refill  . aspirin 325 MG tablet Take 325 mg by mouth as needed.      . fish oil-omega-3 fatty acids 1000 MG capsule Take 1,200 mg by mouth daily. Takes 2 daily    . levothyroxine (SYNTHROID, LEVOTHROID) 112 MCG tablet Take 112 mcg by mouth daily.      . meclizine (ANTIVERT) 50 MG tablet Take 25 mg by mouth 3 (three) times daily as needed.      . Multiple Vitamins-Minerals (CENTRUM SILVER ULTRA WOMENS) TABS Take by mouth daily.      . simvastatin (ZOCOR) 20 MG tablet Take 20 mg by mouth at bedtime.       No current facility-administered medications on file prior to visit.     Past Surgical History:  Procedure Laterality Date  . CARPAL TUNNEL RELEASE     rt. hand  . cataract surgery     bilateral  . MASTECTOMY     left.   REVIEW OF SYSTEMS:  Denies any headaches, dizziness, double vision, fevers, chills, night sweats, nausea, vomiting, diarrhea, constipation, chest pain, heart palpitations, shortness of breath, blood in stool, black tarry stool, urinary pain, urinary burning, urinary frequency, hematuria.  14 point review of systems was performed and is negative except as detailed under history of present illness/interval history and above    PHYSICAL EXAMINATION  ECOG PERFORMANCE STATUS: 1 - Symptomatic but completely ambulatory  Vitals:   04/15/17 1009  BP: (!) 140/57  Pulse: 71  Resp: 16  SpO2: 93%    GENERAL:alert, no distress, well nourished, well developed, comfortable, cooperative and smiling, smelling of tobacco smoke. SKIN: skin color,  texture, turgor are normal, no rashes or significant lesions HEAD: Normocephalic EYES: normal, Conjunctiva are pink; sclera anicteric. Pupils equal and reactive to light.  EARS: External ears normal OROPHARYNX:lips, buccal mucosa, and tongue normal and mucous membranes are moist  NECK: supple, no adenopathy, thyroid normal size, non-tender, without nodularity, no stridor, non-tender, trachea midline LYMPH:  no palpable lymphadenopathy BREAST:  (L) chest wall-s/p mastectomy; chest wall without nodularity or masses; mastectomy scar well-healed.  No skin erythema or puckering.  (R) breast-No palpable masses; She does have several large, raised moles over her chest bilaterally, but these are not concerning for breast cancer.  Right nipple without discharge or inversion.   Axilla-No palpable adenopathy bilaterally.  LUNGS: clear to auscultation and percussion, hyperresonant to percussion HEART: regular rate & rhythm, no murmurs, no gallops, S1 normal and S2 normal ABDOMEN:abdomen soft, non-tender, normal bowel sounds, no masses or organomegaly and no hepatosplenomegaly EXTREMITIES:  no edema NEURO: alert & oriented x 3 with fluent speech, no focal motor/sensory deficits, gait normal   RADIOGRAPHIC STUDIES:  CT Chest Wo Contrast (Accession [id number]) (Order [id number])  Imaging  Date: 05/10/2015 Department: [facility] Released By: Alanda Slim Authorizing: Patrici Ranks, MD  PACS Images   Show images for CT Chest Wo Contrast  Study Result   CLINICAL DATA:  Followup pulmonary nodule  EXAM: CT CHEST WITHOUT CONTRAST  TECHNIQUE: Multidetector CT imaging of the chest was performed following the standard protocol without IV contrast.  COMPARISON:  04/21/2013  FINDINGS: Mediastinum: Normal heart size. There is no pericardial effusion identified. Aortic atherosclerosis. No mediastinal or hilar adenopathy.The trachea is patent and appears midline. Normal appearance  of the esophagus.  Lungs/Pleura: No pleural effusion identified. There is no airspace consolidation or atelectasis. Mild changes of paraseptal emphysema. Tiny left upper lobe lung nodule is unchanged from previous exam, image number 22 of series 3. Also stable is a 3 mm left upper lobe lung nodule, image 20/series 3. 4 mm perifissural nodule in the right middle lobe is stable from previous exam, image 33/series 3.  Upper Abdomen: The visualized portions of the liver and spleen are unremarkable. There is low-attenuation enlargement of the left adrenal gland which is stable from previous exam.  Musculoskeletal: No aggressive lytic or sclerotic bone lesions identified.  IMPRESSION: 1. Stable small pulmonary nodules compared with 04/21/2013 and report from 04/24/2011 compatible with a benign process. No further followup indicated. 2. No acute cardiopulmonary abnormalities. 3. Left adrenal adenoma.   Electronically Signed   By: Kerby Moors M.D.   On: 05/10/2015 13:40   Result Notes   Notes Recorded by Joice Lofts, RN on 05/19/2015 at 9:44 AM Patient notified ------  Notes Recorded by Patrici Ranks, MD on 05/18/2015 at 4:48 PM Advise pulmonary nodules are stable. No further CT scans are needed!. Dr.P  CLINICAL DATA: Six-month follow-up for probably benign right breast cystic lesion. History of left mastectomy.  EXAM: DIGITAL DIAGNOSTIC RIGHT MAMMOGRAM WITH CAD  COMPARISON: 06/09/2014 and earlier  ACR Breast Density Category b: There are scattered areas of fibroglandular density.  FINDINGS: There has been interval resolution of small circumscribed nodule medial to the right nipple. No suspicious mass, distortion, or microcalcifications are identified to suggest presence of malignancy.  Mammographic images were processed with CAD.  IMPRESSION: 1. No mammographic evidence for malignancy. 2. Resolved cystic lesion within the 2 o'clock  location of the right breast.  RECOMMENDATION: Right screening mammogram is recommended in 1 year.  I have discussed the findings and recommendations with the patient. Results were also provided in writing at the conclusion of the visit. If applicable, a reminder letter will be sent to the patient regarding the next appointment.  BI-RADS CATEGORY 1: Negative.   Electronically Signed  By: Nolon Nations M.D.  On: 01/18/2015 09:04   ASSESSMENT:  1. Stage II (T2 N0 M0) left-sided adenocarcinoma breast diagnosed in 1990 by Dr. Romona Curls and treated by Dr. Juanita Craver on NSAB PPD-19 with CMF followed by 5 years of tamoxifen and she remains disease free thus far.  2. COPD with smoking but she also has rales at the left lower base which do not clear with coughing and she had a greater than 40 pack- year history of smoking.  3. Graves disease diagnosed in 1999 treated I-131 ablation, now on Synthroid  4. Hypertriglyceridemia on therapy. 5. Pulmonary Nodules-benign  6. R breast cyst, now resolved  Clinically NED on breast exam today. She is cured from her breast cancer, she is now 28 years out. Continue annual right breast screening mammograms. Counseled patient on smoke cessation however she states that it is difficult for her to quit. RTC in 1 year for follow up.  All questions were answered. The patient knows to call the clinic with any problems, questions or concerns. We can certainly see the patient much sooner if necessary.

## 2017-06-04 DIAGNOSIS — R739 Hyperglycemia, unspecified: Secondary | ICD-10-CM | POA: Diagnosis not present

## 2017-06-04 DIAGNOSIS — E039 Hypothyroidism, unspecified: Secondary | ICD-10-CM | POA: Diagnosis not present

## 2017-06-04 DIAGNOSIS — M8589 Other specified disorders of bone density and structure, multiple sites: Secondary | ICD-10-CM | POA: Diagnosis not present

## 2017-06-04 DIAGNOSIS — Z79899 Other long term (current) drug therapy: Secondary | ICD-10-CM | POA: Diagnosis not present

## 2017-06-04 DIAGNOSIS — Z0001 Encounter for general adult medical examination with abnormal findings: Secondary | ICD-10-CM | POA: Diagnosis not present

## 2017-06-04 DIAGNOSIS — Z23 Encounter for immunization: Secondary | ICD-10-CM | POA: Diagnosis not present

## 2017-06-04 DIAGNOSIS — E785 Hyperlipidemia, unspecified: Secondary | ICD-10-CM | POA: Diagnosis not present

## 2017-06-04 DIAGNOSIS — E0500 Thyrotoxicosis with diffuse goiter without thyrotoxic crisis or storm: Secondary | ICD-10-CM | POA: Diagnosis not present

## 2017-06-04 DIAGNOSIS — Z853 Personal history of malignant neoplasm of breast: Secondary | ICD-10-CM | POA: Diagnosis not present

## 2017-06-04 DIAGNOSIS — F17210 Nicotine dependence, cigarettes, uncomplicated: Secondary | ICD-10-CM | POA: Diagnosis not present

## 2017-06-25 DIAGNOSIS — Z1212 Encounter for screening for malignant neoplasm of rectum: Secondary | ICD-10-CM | POA: Diagnosis not present

## 2017-06-25 DIAGNOSIS — Z1211 Encounter for screening for malignant neoplasm of colon: Secondary | ICD-10-CM | POA: Diagnosis not present

## 2017-11-29 DIAGNOSIS — E785 Hyperlipidemia, unspecified: Secondary | ICD-10-CM | POA: Diagnosis not present

## 2017-11-29 DIAGNOSIS — Z79899 Other long term (current) drug therapy: Secondary | ICD-10-CM | POA: Diagnosis not present

## 2017-11-29 DIAGNOSIS — E039 Hypothyroidism, unspecified: Secondary | ICD-10-CM | POA: Diagnosis not present

## 2017-12-03 DIAGNOSIS — E785 Hyperlipidemia, unspecified: Secondary | ICD-10-CM | POA: Diagnosis not present

## 2017-12-03 DIAGNOSIS — F17210 Nicotine dependence, cigarettes, uncomplicated: Secondary | ICD-10-CM | POA: Diagnosis not present

## 2017-12-03 DIAGNOSIS — Z79899 Other long term (current) drug therapy: Secondary | ICD-10-CM | POA: Diagnosis not present

## 2017-12-03 DIAGNOSIS — R42 Dizziness and giddiness: Secondary | ICD-10-CM | POA: Diagnosis not present

## 2017-12-03 DIAGNOSIS — E039 Hypothyroidism, unspecified: Secondary | ICD-10-CM | POA: Diagnosis not present

## 2017-12-17 ENCOUNTER — Other Ambulatory Visit (HOSPITAL_COMMUNITY): Payer: Self-pay | Admitting: Internal Medicine

## 2017-12-17 DIAGNOSIS — Z1231 Encounter for screening mammogram for malignant neoplasm of breast: Secondary | ICD-10-CM

## 2018-01-22 ENCOUNTER — Encounter (HOSPITAL_COMMUNITY): Payer: Self-pay

## 2018-01-22 ENCOUNTER — Ambulatory Visit (HOSPITAL_COMMUNITY)
Admission: RE | Admit: 2018-01-22 | Discharge: 2018-01-22 | Disposition: A | Discharge status: Home / Self Care | Payer: Medicare PPO | Source: Ambulatory Visit | Attending: Internal Medicine | Admitting: Internal Medicine

## 2018-01-22 ENCOUNTER — Other Ambulatory Visit (HOSPITAL_COMMUNITY): Payer: Self-pay | Admitting: Internal Medicine

## 2018-01-22 DIAGNOSIS — Z1231 Encounter for screening mammogram for malignant neoplasm of breast: Secondary | ICD-10-CM

## 2018-02-21 DIAGNOSIS — E039 Hypothyroidism, unspecified: Secondary | ICD-10-CM | POA: Diagnosis not present

## 2018-02-25 DIAGNOSIS — E039 Hypothyroidism, unspecified: Secondary | ICD-10-CM | POA: Diagnosis not present

## 2018-02-25 DIAGNOSIS — E785 Hyperlipidemia, unspecified: Secondary | ICD-10-CM | POA: Diagnosis not present

## 2018-02-25 DIAGNOSIS — Z79899 Other long term (current) drug therapy: Secondary | ICD-10-CM | POA: Diagnosis not present

## 2018-04-15 ENCOUNTER — Encounter (HOSPITAL_COMMUNITY): Payer: Self-pay | Admitting: Internal Medicine

## 2018-04-15 ENCOUNTER — Inpatient Hospital Stay (HOSPITAL_COMMUNITY): Payer: Medicare PPO | Attending: Hematology | Admitting: Internal Medicine

## 2018-04-15 VITALS — BP 135/64 | HR 75 | Temp 98.4°F | Resp 16 | Wt 129.9 lb

## 2018-04-15 DIAGNOSIS — Z853 Personal history of malignant neoplasm of breast: Secondary | ICD-10-CM

## 2018-04-15 DIAGNOSIS — R918 Other nonspecific abnormal finding of lung field: Secondary | ICD-10-CM | POA: Diagnosis not present

## 2018-04-15 DIAGNOSIS — E279 Disorder of adrenal gland, unspecified: Secondary | ICD-10-CM | POA: Insufficient documentation

## 2018-04-15 DIAGNOSIS — Z9012 Acquired absence of left breast and nipple: Secondary | ICD-10-CM | POA: Insufficient documentation

## 2018-04-15 DIAGNOSIS — C50912 Malignant neoplasm of unspecified site of left female breast: Secondary | ICD-10-CM

## 2018-04-15 DIAGNOSIS — F17210 Nicotine dependence, cigarettes, uncomplicated: Secondary | ICD-10-CM | POA: Diagnosis not present

## 2018-04-15 NOTE — Patient Instructions (Signed)
[facility] at [facility] Discharge Instructions  You saw Dr. Walden Field today. Follow up in 1 year around May after you have your mammogram.   Thank you for choosing [facility] at [facility] to provide your oncology and hematology care.  To afford each patient quality time with our provider, please arrive at least 15 minutes before your scheduled appointment time.   If you have a lab appointment with the [facility] please come in thru the  Main Entrance and check in at the main information desk  You need to re-schedule your appointment should you arrive 10 or more minutes late.  We strive to give you quality time with our providers, and arriving late affects you and other patients whose appointments are after yours.  Also, if you no show three or more times for appointments you may be dismissed from the clinic at the providers discretion.     Again, thank you for choosing [facility].  Our hope is that these requests will decrease the amount of time that you wait before being seen by our physicians.       _____________________________________________________________  Should you have questions after your visit to [facility], please contact our office at [phone] between the hours of 8:00 a.m. and 4:30 p.m.  Voicemails left after 4:00 p.m. will not be returned until the following business day.  For prescription refill requests, have your pharmacy contact our office and allow 72 hours.    Cancer Center Support Programs:   > Cancer Support Group  2nd Tuesday of the month 1pm-2pm, Journey Room

## 2018-04-15 NOTE — Progress Notes (Signed)
Diagnosis Adenocarcinoma of left breast ([place]) - Plan: MM Digital Screening Unilat R  Staging Cancer Staging Adenocarcinoma of left breast Staging form: Breast, AJCC 7th Edition - Clinical: Stage IIA (T2, N0, cM0) - Signed by Baird Cancer, PA on 04/16/2012   Assessment and Plan:  1. Stage II (T2 N0 M0) left-sided adenocarcinoma breast diagnosed in 1990 by Dr. Romona Curls and treated by Dr. Juanita Craver on NSAB PPD-19 with CMF followed by 5 years of tamoxifen.  Right screening mammogram done 01/22/2018 reviewed and is negative.   She  is set up for right screening mammogram in 01/2019 and will follow-up at that time to go over results.    2.  Smoking.  Pt has > 40 pack yr history of smoking.  She had CT chest done 05/2016 that showed stable nodularity and left adrenal adenoma.  She will be set up for repeat imaging in 05/2018 and will be notified of results.   Cessation recommended.    3.  Left adrenal adenoma.  Suspected to be benign finding.  She will be set up for repeat imaging in 05/2018 and will be notified of results.    4. Graves disease diagnosed in 1999 treated I-131 ablation.  Pt reports she had some medications modified.  Continue to follow-up with PCP or endocrinologist for monitoring.     Interval History:  Historical data obtained from the note dated 04/15/2017. 77 y.o. Female previously followed by Dr. Talbert Cage for Stage II (T2 N0 M0) left-sided adenocarcinoma breast diagnosed in 1990 by Dr. Romona Curls and treated by Dr. Juanita Craver on NSAB PPD-19 with CMF followed by 5 years of tamoxifen.    Current Status:  Pt is seen today for follow-up.  She is here to go over recent mammogram.  She continues to smoke.     Problem List Patient Active Problem List   Diagnosis Date Noted  . Adenocarcinoma of left breast [C50.919] 04/16/2012    Past Medical History Past Medical History:  Diagnosis Date  . Adenocarcinoma of left breast 04/16/2012   Stage II (T2 N0 M0) left-sided adenocarcinoma  breast diagnosed in 1990 by Dr. Romona Curls and treated by Dr. Juanita Craver on NSAB PPD-19 with CMF followed by 5 years of tamoxifen and she remains disease free thus far.   . Breast cancer ([place])    left breast/mastectomy/dx approx1990  . Gall bladder stones   . Grave's disease   . High triglycerides   . Nerve compression    pinched in neck    Past Surgical History Past Surgical History:  Procedure Laterality Date  . CARPAL TUNNEL RELEASE     rt. hand  . cataract surgery     bilateral  . MASTECTOMY     left.    Family History Family History  Problem Relation Age of Onset  . Cancer Father        lung cancer  . Cancer Brother        colon cancer  . Cancer Other        lung cancer/died age 42     Social History  reports that she has been smoking. She has a 60.00 pack-year smoking history. She has never used smokeless tobacco. She reports that she does not drink alcohol or use drugs.  Medications  Current Outpatient Medications:  .  aspirin 325 MG tablet, Take 325 mg by mouth as needed.  , Disp: , Rfl:  .  fish oil-omega-3 fatty acids 1000 MG capsule, Take 1,200 mg by  mouth daily. Takes 2 daily, Disp: , Rfl:  .  levothyroxine (SYNTHROID, LEVOTHROID) 100 MCG tablet, , Disp: , Rfl:  .  meclizine (ANTIVERT) 50 MG tablet, Take 25 mg by mouth 3 (three) times daily as needed.  , Disp: , Rfl:  .  Multiple Vitamins-Minerals (CENTRUM SILVER ULTRA WOMENS) TABS, Take by mouth daily.  , Disp: , Rfl:  .  simvastatin (ZOCOR) 20 MG tablet, Take 20 mg by mouth at bedtime.  , Disp: , Rfl:   Allergies Patient has no known allergies.  Review of Systems Review of Systems - Oncology ROS negative other than symptoms she reports are associated with Graves disease.     Physical Exam  Vitals Wt Readings from Last 3 Encounters:  04/15/18 129 lb 14.4 oz (58.9 kg)  04/15/17 132 lb (59.9 kg)  04/13/16 143 lb 8 oz (65.1 kg)   Temp Readings from Last 3 Encounters:  04/15/18 98.4 F (36.9 C)  (Oral)  04/13/16 98.4 F (36.9 C) (Oral)  04/15/15 98.3 F (36.8 C) (Oral)   BP Readings from Last 3 Encounters:  04/15/18 135/64  04/15/17 (!) 140/57  04/13/16 (!) 141/53   Pulse Readings from Last 3 Encounters:  04/15/18 75  04/15/17 71  04/13/16 76   Constitutional: Well-developed, well-nourished, and in no distress.   HENT: Head: Normocephalic and atraumatic.  Mouth/Throat: No oropharyngeal exudate. Mucosa moist. Eyes: Pupils are equal, round, and reactive to light. Conjunctivae are normal. No scleral icterus.  Neck: Normal range of motion. Neck supple. No JVD present.  Cardiovascular: Normal rate, regular rhythm and normal heart sounds.  Exam reveals no gallop and no friction rub.   No murmur heard. Pulmonary/Chest: Effort normal and breath sounds normal. No respiratory distress. No wheezes.No rales.  Abdominal: Soft. Bowel sounds are normal. No distension. There is no tenderness. There is no guarding.  Musculoskeletal: No edema or tenderness.  Lymphadenopathy: No cervical, axillary or supraclavicular adenopathy.  Neurological: Alert and oriented to person, place, and time. No cranial nerve deficit.  Skin: Skin is warm and dry. No rash noted. No erythema. No pallor.  Psychiatric: Affect and judgment normal.   Labs No visits with results within 3 Day(s) from this visit.  Latest known visit with results is:  Appointment on 04/15/2017  Component Date Value Ref Range Status  . WBC 04/15/2017 9.0  4.0 - 10.5 K/uL Final  . RBC 04/15/2017 4.82  3.87 - 5.11 MIL/uL Final  . Hemoglobin 04/15/2017 15.3* 12.0 - 15.0 g/dL Final  . HCT 04/15/2017 45.7  36.0 - 46.0 % Final  . MCV 04/15/2017 94.8  78.0 - 100.0 fL Final  . MCH 04/15/2017 31.7  26.0 - 34.0 pg Final  . MCHC 04/15/2017 33.5  30.0 - 36.0 g/dL Final  . RDW 04/15/2017 13.5  11.5 - 15.5 % Final  . Platelets 04/15/2017 282  150 - 400 K/uL Final  . Neutrophils Relative % 04/15/2017 70  % Final  . Neutro Abs 04/15/2017 6.3   1.7 - 7.7 K/uL Final  . Lymphocytes Relative 04/15/2017 23  % Final  . Lymphs Abs 04/15/2017 2.1  0.7 - 4.0 K/uL Final  . Monocytes Relative 04/15/2017 5  % Final  . Monocytes Absolute 04/15/2017 0.4  0.1 - 1.0 K/uL Final  . Eosinophils Relative 04/15/2017 2  % Final  . Eosinophils Absolute 04/15/2017 0.2  0.0 - 0.7 K/uL Final  . Basophils Relative 04/15/2017 0  % Final  . Basophils Absolute 04/15/2017 0.0  0.0 - 0.1  K/uL Final  . Sodium 04/15/2017 142  135 - 145 mmol/L Final  . Potassium 04/15/2017 4.5  3.5 - 5.1 mmol/L Final  . Chloride 04/15/2017 103  101 - 111 mmol/L Final  . CO2 04/15/2017 28  22 - 32 mmol/L Final  . Glucose, Bld 04/15/2017 109* 65 - 99 mg/dL Final  . BUN 04/15/2017 10  6 - 20 mg/dL Final  . Creatinine, Ser 04/15/2017 0.71  0.44 - 1.00 mg/dL Final  . Calcium 04/15/2017 9.8  8.9 - 10.3 mg/dL Final  . Total Protein 04/15/2017 7.7  6.5 - 8.1 g/dL Final  . Albumin 04/15/2017 4.3  3.5 - 5.0 g/dL Final  . AST 04/15/2017 21  15 - 41 U/L Final  . ALT 04/15/2017 14  14 - 54 U/L Final  . Alkaline Phosphatase 04/15/2017 82  38 - 126 U/L Final  . Total Bilirubin 04/15/2017 0.7  0.3 - 1.2 mg/dL Final  . GFR calc non Af Amer 04/15/2017 >60  >60 mL/min Final  . GFR calc Af Amer 04/15/2017 >60  >60 mL/min Final   Comment: (NOTE) The eGFR has been calculated using the CKD EPI equation. This calculation has not been validated in all clinical situations. eGFR's persistently <60 mL/min signify possible Chronic Kidney Disease.   . Anion gap 04/15/2017 11  5 - 15 Final     Pathology Orders Placed This Encounter  Procedures  . MM Digital Screening Unilat R    Standing Status:   Future    Standing Expiration Date:   04/15/2019    Order Specific Question:   Reason for Exam (SYMPTOM  OR DIAGNOSIS REQUIRED)    Answer:   left breast cancer    Order Specific Question:   Preferred imaging location?    Answer:   [facility]       Zoila Shutter MD

## 2018-04-16 ENCOUNTER — Telehealth (HOSPITAL_COMMUNITY): Payer: Self-pay

## 2018-04-16 NOTE — Telephone Encounter (Signed)
Called patient to let her know Dr. Walden Field wants her to have a CT because she had CT chest done 05/2016 that showed stable nodularity and left adrenal adenoma. Unable to reach patient but left a message. Message also sent to scheduling.

## 2018-05-02 ENCOUNTER — Ambulatory Visit (HOSPITAL_COMMUNITY)
Admission: RE | Admit: 2018-05-02 | Discharge: 2018-05-02 | Disposition: A | Discharge status: Home / Self Care | Payer: Medicare PPO | Source: Ambulatory Visit | Attending: Internal Medicine | Admitting: Internal Medicine

## 2018-05-02 DIAGNOSIS — I700 Atherosclerosis of aorta: Secondary | ICD-10-CM | POA: Diagnosis not present

## 2018-05-02 DIAGNOSIS — R918 Other nonspecific abnormal finding of lung field: Secondary | ICD-10-CM | POA: Diagnosis not present

## 2018-05-02 DIAGNOSIS — J432 Centrilobular emphysema: Secondary | ICD-10-CM | POA: Insufficient documentation

## 2018-05-08 ENCOUNTER — Telehealth (HOSPITAL_COMMUNITY): Payer: Self-pay

## 2018-05-08 NOTE — Telephone Encounter (Signed)
Called patient per Dr. Walden Field request and notified her that her CT was negative. Patient verbalized understanding.

## 2018-06-02 DIAGNOSIS — E039 Hypothyroidism, unspecified: Secondary | ICD-10-CM | POA: Diagnosis not present

## 2018-06-02 DIAGNOSIS — R739 Hyperglycemia, unspecified: Secondary | ICD-10-CM | POA: Diagnosis not present

## 2018-06-02 DIAGNOSIS — E785 Hyperlipidemia, unspecified: Secondary | ICD-10-CM | POA: Diagnosis not present

## 2018-06-02 DIAGNOSIS — Z79899 Other long term (current) drug therapy: Secondary | ICD-10-CM | POA: Diagnosis not present

## 2018-06-02 DIAGNOSIS — M8589 Other specified disorders of bone density and structure, multiple sites: Secondary | ICD-10-CM | POA: Diagnosis not present

## 2018-06-05 DIAGNOSIS — Z23 Encounter for immunization: Secondary | ICD-10-CM | POA: Diagnosis not present

## 2018-06-05 DIAGNOSIS — F17210 Nicotine dependence, cigarettes, uncomplicated: Secondary | ICD-10-CM | POA: Diagnosis not present

## 2018-06-05 DIAGNOSIS — E785 Hyperlipidemia, unspecified: Secondary | ICD-10-CM | POA: Diagnosis not present

## 2018-06-05 DIAGNOSIS — Z853 Personal history of malignant neoplasm of breast: Secondary | ICD-10-CM | POA: Diagnosis not present

## 2018-06-05 DIAGNOSIS — Z0001 Encounter for general adult medical examination with abnormal findings: Secondary | ICD-10-CM | POA: Diagnosis not present

## 2018-06-05 DIAGNOSIS — Z136 Encounter for screening for cardiovascular disorders: Secondary | ICD-10-CM | POA: Diagnosis not present

## 2018-06-05 DIAGNOSIS — I2510 Atherosclerotic heart disease of native coronary artery without angina pectoris: Secondary | ICD-10-CM | POA: Diagnosis not present

## 2018-06-05 DIAGNOSIS — J439 Emphysema, unspecified: Secondary | ICD-10-CM | POA: Diagnosis not present

## 2018-06-05 DIAGNOSIS — E0500 Thyrotoxicosis with diffuse goiter without thyrotoxic crisis or storm: Secondary | ICD-10-CM | POA: Diagnosis not present

## 2018-06-05 DIAGNOSIS — Z79899 Other long term (current) drug therapy: Secondary | ICD-10-CM | POA: Diagnosis not present

## 2018-07-23 ENCOUNTER — Other Ambulatory Visit (HOSPITAL_COMMUNITY): Payer: Self-pay | Admitting: Family Medicine

## 2018-07-23 DIAGNOSIS — Z136 Encounter for screening for cardiovascular disorders: Secondary | ICD-10-CM

## 2018-07-23 DIAGNOSIS — F17210 Nicotine dependence, cigarettes, uncomplicated: Secondary | ICD-10-CM

## 2018-07-30 ENCOUNTER — Ambulatory Visit (HOSPITAL_COMMUNITY)
Admission: RE | Admit: 2018-07-30 | Discharge: 2018-07-30 | Disposition: A | Discharge status: Home / Self Care | Payer: Medicare PPO | Source: Ambulatory Visit | Attending: Family Medicine | Admitting: Family Medicine

## 2018-07-30 DIAGNOSIS — N2889 Other specified disorders of kidney and ureter: Secondary | ICD-10-CM | POA: Insufficient documentation

## 2018-07-30 DIAGNOSIS — F17210 Nicotine dependence, cigarettes, uncomplicated: Secondary | ICD-10-CM | POA: Insufficient documentation

## 2018-07-30 DIAGNOSIS — Z136 Encounter for screening for cardiovascular disorders: Secondary | ICD-10-CM | POA: Diagnosis not present

## 2018-08-06 ENCOUNTER — Other Ambulatory Visit (HOSPITAL_COMMUNITY): Payer: Self-pay | Admitting: Family Medicine

## 2018-08-06 DIAGNOSIS — R918 Other nonspecific abnormal finding of lung field: Secondary | ICD-10-CM

## 2018-08-11 ENCOUNTER — Other Ambulatory Visit (HOSPITAL_COMMUNITY): Payer: Self-pay | Admitting: Family Medicine

## 2018-08-11 DIAGNOSIS — R918 Other nonspecific abnormal finding of lung field: Secondary | ICD-10-CM

## 2018-08-11 DIAGNOSIS — N2889 Other specified disorders of kidney and ureter: Secondary | ICD-10-CM

## 2018-08-11 DIAGNOSIS — R9389 Abnormal findings on diagnostic imaging of other specified body structures: Secondary | ICD-10-CM

## 2018-09-02 DIAGNOSIS — E785 Hyperlipidemia, unspecified: Secondary | ICD-10-CM | POA: Diagnosis not present

## 2018-09-02 DIAGNOSIS — Z79899 Other long term (current) drug therapy: Secondary | ICD-10-CM | POA: Diagnosis not present

## 2018-09-02 DIAGNOSIS — R739 Hyperglycemia, unspecified: Secondary | ICD-10-CM | POA: Diagnosis not present

## 2018-09-02 DIAGNOSIS — E039 Hypothyroidism, unspecified: Secondary | ICD-10-CM | POA: Diagnosis not present

## 2018-09-05 ENCOUNTER — Encounter (HOSPITAL_COMMUNITY): Payer: Self-pay

## 2018-09-05 ENCOUNTER — Ambulatory Visit (HOSPITAL_COMMUNITY)
Admission: RE | Admit: 2018-09-05 | Discharge: 2018-09-05 | Disposition: A | Discharge status: Home / Self Care | Payer: Medicare PPO | Source: Ambulatory Visit | Attending: Family Medicine | Admitting: Family Medicine

## 2018-09-05 DIAGNOSIS — N2889 Other specified disorders of kidney and ureter: Secondary | ICD-10-CM | POA: Diagnosis not present

## 2018-09-05 DIAGNOSIS — R918 Other nonspecific abnormal finding of lung field: Secondary | ICD-10-CM | POA: Insufficient documentation

## 2018-09-05 DIAGNOSIS — R9389 Abnormal findings on diagnostic imaging of other specified body structures: Secondary | ICD-10-CM | POA: Insufficient documentation

## 2018-09-05 DIAGNOSIS — N132 Hydronephrosis with renal and ureteral calculous obstruction: Secondary | ICD-10-CM | POA: Diagnosis not present

## 2018-09-05 MED ORDER — IOPAMIDOL (ISOVUE-300) INJECTION 61%
100.0000 mL | Freq: Once | INTRAVENOUS | Status: AC | PRN
  Administered 2018-09-05: 100 mL via INTRAVENOUS

## 2018-09-11 DIAGNOSIS — I700 Atherosclerosis of aorta: Secondary | ICD-10-CM | POA: Diagnosis not present

## 2018-09-11 DIAGNOSIS — M545 Low back pain: Secondary | ICD-10-CM | POA: Diagnosis not present

## 2018-09-11 DIAGNOSIS — E039 Hypothyroidism, unspecified: Secondary | ICD-10-CM | POA: Diagnosis not present

## 2018-09-11 DIAGNOSIS — N2889 Other specified disorders of kidney and ureter: Secondary | ICD-10-CM | POA: Diagnosis not present

## 2018-09-11 DIAGNOSIS — Z79899 Other long term (current) drug therapy: Secondary | ICD-10-CM | POA: Diagnosis not present

## 2018-09-11 DIAGNOSIS — K8020 Calculus of gallbladder without cholecystitis without obstruction: Secondary | ICD-10-CM | POA: Diagnosis not present

## 2018-09-11 DIAGNOSIS — E785 Hyperlipidemia, unspecified: Secondary | ICD-10-CM | POA: Diagnosis not present

## 2018-09-11 DIAGNOSIS — N200 Calculus of kidney: Secondary | ICD-10-CM | POA: Diagnosis not present

## 2018-12-15 ENCOUNTER — Other Ambulatory Visit (HOSPITAL_COMMUNITY): Payer: Self-pay | Admitting: Internal Medicine

## 2018-12-15 DIAGNOSIS — Z1231 Encounter for screening mammogram for malignant neoplasm of breast: Secondary | ICD-10-CM

## 2018-12-22 DIAGNOSIS — Z79899 Other long term (current) drug therapy: Secondary | ICD-10-CM | POA: Diagnosis not present

## 2018-12-22 DIAGNOSIS — N2889 Other specified disorders of kidney and ureter: Secondary | ICD-10-CM | POA: Diagnosis not present

## 2018-12-22 DIAGNOSIS — R030 Elevated blood-pressure reading, without diagnosis of hypertension: Secondary | ICD-10-CM | POA: Diagnosis not present

## 2018-12-22 DIAGNOSIS — E785 Hyperlipidemia, unspecified: Secondary | ICD-10-CM | POA: Diagnosis not present

## 2018-12-22 DIAGNOSIS — E039 Hypothyroidism, unspecified: Secondary | ICD-10-CM | POA: Diagnosis not present

## 2019-01-29 ENCOUNTER — Ambulatory Visit (HOSPITAL_COMMUNITY)
Admission: RE | Admit: 2019-01-29 | Discharge: 2019-01-29 | Disposition: A | Discharge status: Home / Self Care | Payer: Medicare PPO | Source: Ambulatory Visit | Attending: Hematology | Admitting: Hematology

## 2019-01-29 ENCOUNTER — Inpatient Hospital Stay (HOSPITAL_COMMUNITY): Payer: Medicare PPO

## 2019-01-29 ENCOUNTER — Other Ambulatory Visit: Payer: Self-pay

## 2019-01-29 ENCOUNTER — Encounter (HOSPITAL_COMMUNITY): Payer: Self-pay | Admitting: Hematology

## 2019-01-29 ENCOUNTER — Inpatient Hospital Stay (HOSPITAL_COMMUNITY): Payer: Medicare PPO | Attending: Hematology | Admitting: Hematology

## 2019-01-29 VITALS — BP 135/54 | HR 68 | Temp 98.7°F | Resp 14 | Wt 133.2 lb

## 2019-01-29 DIAGNOSIS — Z853 Personal history of malignant neoplasm of breast: Secondary | ICD-10-CM

## 2019-01-29 DIAGNOSIS — F17210 Nicotine dependence, cigarettes, uncomplicated: Secondary | ICD-10-CM | POA: Diagnosis not present

## 2019-01-29 DIAGNOSIS — R42 Dizziness and giddiness: Secondary | ICD-10-CM | POA: Diagnosis not present

## 2019-01-29 DIAGNOSIS — Z9012 Acquired absence of left breast and nipple: Secondary | ICD-10-CM | POA: Diagnosis not present

## 2019-01-29 DIAGNOSIS — N2889 Other specified disorders of kidney and ureter: Secondary | ICD-10-CM

## 2019-01-29 DIAGNOSIS — C50912 Malignant neoplasm of unspecified site of left female breast: Secondary | ICD-10-CM

## 2019-01-29 DIAGNOSIS — Z801 Family history of malignant neoplasm of trachea, bronchus and lung: Secondary | ICD-10-CM

## 2019-01-29 LAB — COMPREHENSIVE METABOLIC PANEL
ALT: 16 U/L (ref 0–44)
AST: 18 U/L (ref 15–41)
Albumin: 4.2 g/dL (ref 3.5–5.0)
Alkaline Phosphatase: 88 U/L (ref 38–126)
Anion gap: 11 (ref 5–15)
BUN: 21 mg/dL (ref 8–23)
CO2: 24 mmol/L (ref 22–32)
Calcium: 9.2 mg/dL (ref 8.9–10.3)
Chloride: 106 mmol/L (ref 98–111)
Creatinine, Ser: 0.61 mg/dL (ref 0.44–1.00)
GFR calc Af Amer: >60 mL/min (ref >60)
GFR calc non Af Amer: >60 mL/min (ref >60)
Glucose, Bld: 90 mg/dL (ref 70–99)
Potassium: 3.8 mmol/L (ref 3.5–5.1)
Sodium: 141 mmol/L (ref 135–145)
Total Bilirubin: 0.4 mg/dL (ref 0.3–1.2)
Total Protein: 7.5 g/dL (ref 6.5–8.1)

## 2019-01-29 LAB — CBC WITH DIFFERENTIAL/PLATELET
Abs Immature Granulocytes: 0.01 10*3/uL (ref 0.00–0.07)
Basophils Absolute: 0.1 10*3/uL (ref 0.0–0.1)
Basophils Relative: 1 %
Eosinophils Absolute: 0.2 10*3/uL (ref 0.0–0.5)
Eosinophils Relative: 2 %
HCT: 35.8 % — ABNORMAL LOW (ref 36.0–46.0)
Hemoglobin: 10.9 g/dL — ABNORMAL LOW (ref 12.0–15.0)
Immature Granulocytes: 0 %
Lymphocytes Relative: 30 %
Lymphs Abs: 2.2 10*3/uL (ref 0.7–4.0)
MCH: 27.9 pg (ref 26.0–34.0)
MCHC: 30.4 g/dL (ref 30.0–36.0)
MCV: 91.8 fL (ref 80.0–100.0)
Monocytes Absolute: 0.5 10*3/uL (ref 0.1–1.0)
Monocytes Relative: 6 %
Neutro Abs: 4.4 10*3/uL (ref 1.7–7.7)
Neutrophils Relative %: 61 %
Platelets: 330 10*3/uL (ref 150–400)
RBC: 3.9 MIL/uL (ref 3.87–5.11)
RDW: 16 % — ABNORMAL HIGH (ref 11.5–15.5)
WBC: 7.2 10*3/uL (ref 4.0–10.5)
nRBC: 0 % (ref 0.0–0.2)

## 2019-01-29 LAB — LACTATE DEHYDROGENASE: LDH: 127 U/L (ref 98–192)

## 2019-01-29 NOTE — Progress Notes (Signed)
[place], [place] [address]   CLINIC:  Medical Oncology/Hematology  PCP:  Jani Gravel, [place] [address] [phone]   REASON FOR VISIT:  Follow-up for Adenocarcinoma of left breast     CANCER STAGING: Cancer Staging Adenocarcinoma of left breast Staging form: Breast, AJCC 7th Edition - Clinical: Stage IIA (T2, N0, cM0) - Signed by Baird Cancer, PA on 04/16/2012    INTERVAL HISTORY:  Ms. Terlecki 78 y.o. female returns for routine follow-up. She is here today alone. She states that . Denies any nausea, vomiting, or diarrhea. Denies any new pains. Had not noticed any recent bleeding such as epistaxis, hematuria or hematochezia. Denies recent chest pain on exertion, shortness of breath on minimal exertion, pre-syncopal episodes, or palpitations. Denies any numbness or tingling in hands or feet. Denies any recent fevers, infections, or recent hospitalizations. Patient reports appetite at 100 % and energy level at 75 %.    REVIEW OF SYSTEMS:  Review of Systems  Neurological: Positive for dizziness.  All other systems reviewed and are negative.    PAST MEDICAL/SURGICAL HISTORY:  Past Medical History:  Diagnosis Date  . Adenocarcinoma of left breast 04/16/2012   Stage II (T2 N0 M0) left-sided adenocarcinoma breast diagnosed in 1990 by Dr. Romona Curls and treated by Dr. Juanita Craver on NSAB PPD-19 with CMF followed by 5 years of tamoxifen and she remains disease free thus far.   . Breast cancer ([place])    left breast/mastectomy/dx approx1990  . Gall bladder stones   . Grave's disease   . High triglycerides   . Nerve compression    pinched in neck   Past Surgical History:  Procedure Laterality Date  . CARPAL TUNNEL RELEASE     rt. hand  . cataract surgery     bilateral  . MASTECTOMY     left.     SOCIAL HISTORY:  Social History   Socioeconomic History  . Marital status: Divorced    Spouse  name: Not on file  . Number of children: Not on file  . Years of education: Not on file  . Highest education level: Not on file  Occupational History  . Not on file  Social Needs  . Financial resource strain: Not on file  . Food insecurity:    Worry: Not on file    Inability: Not on file  . Transportation needs:    Medical: Not on file    Non-medical: Not on file  Tobacco Use  . Smoking status: Current Every Day Smoker    Packs/day: 1.00    Years: 60.00    Pack years: 60.00  . Smokeless tobacco: Never Used  Substance and Sexual Activity  . Alcohol use: No  . Drug use: No  . Sexual activity: Not on file  Lifestyle  . Physical activity:    Days per week: Not on file    Minutes per session: Not on file  . Stress: Not on file  Relationships  . Social connections:    Talks on phone: Not on file    Gets together: Not on file    Attends religious service: Not on file    Active member of club or organization: Not on file    Attends meetings of clubs or organizations: Not on file    Relationship status: Not on file  . Intimate partner violence:    Fear of current or ex partner: Not on file  Emotionally abused: Not on file    Physically abused: Not on file    Forced sexual activity: Not on file  Other Topics Concern  . Not on file  Social History Narrative  . Not on file    FAMILY HISTORY:  Family History  Problem Relation Age of Onset  . Cancer Father        lung cancer  . Cancer Brother        colon cancer  . Cancer Other        lung cancer/died age 4    CURRENT MEDICATIONS:  Outpatient Encounter Medications as of 01/29/2019  Medication Sig  . aspirin 325 MG tablet Take 325 mg by mouth as needed.    . fish oil-omega-3 fatty acids 1000 MG capsule Take 1,200 mg by mouth daily. Takes 2 daily  . levothyroxine (SYNTHROID, LEVOTHROID) 100 MCG tablet Take 100 mcg by mouth daily before breakfast.   . meclizine (ANTIVERT) 50 MG tablet Take 25 mg by mouth 3 (three)  times daily as needed.    . Multiple Vitamins-Minerals (CENTRUM SILVER ULTRA WOMENS) TABS Take by mouth daily.    . simvastatin (ZOCOR) 20 MG tablet Take 20 mg by mouth at bedtime.    . [DISCONTINUED] meclizine (ANTIVERT) 25 MG tablet    No facility-administered encounter medications on file as of 01/29/2019.     ALLERGIES:  No Known Allergies   PHYSICAL EXAM:  ECOG Performance status: 1  Vitals:   01/29/19 1120  BP: (!) 135/54  Pulse: 68  Resp: 14  Temp: 98.7 F (37.1 C)  SpO2: 94%   Filed Weights   01/29/19 1120  Weight: 133 lb 3.2 oz (60.4 kg)    Physical Exam Vitals signs reviewed.  Constitutional:      Appearance: Normal appearance.  Cardiovascular:     Rate and Rhythm: Normal rate and regular rhythm.     Heart sounds: Normal heart sounds.  Pulmonary:     Effort: Pulmonary effort is normal.     Breath sounds: Normal breath sounds.  Abdominal:     General: There is no distension.     Palpations: Abdomen is soft. There is no mass.  Musculoskeletal:        General: No swelling.  Neurological:     General: No focal deficit present.     Mental Status: She is alert and oriented to person, place, and time.  Psychiatric:        Mood and Affect: Mood normal.        Behavior: Behavior normal.      LABORATORY DATA:  I have reviewed the labs as listed.  CBC    Component Value Date/Time   WBC 7.2 01/29/2019 1323   RBC 3.90 01/29/2019 1323   HGB 10.9 (L) 01/29/2019 1323   HCT 35.8 (L) 01/29/2019 1323   PLT 330 01/29/2019 1323   MCV 91.8 01/29/2019 1323   MCH 27.9 01/29/2019 1323   MCHC 30.4 01/29/2019 1323   RDW 16.0 (H) 01/29/2019 1323   LYMPHSABS 2.2 01/29/2019 1323   MONOABS 0.5 01/29/2019 1323   EOSABS 0.2 01/29/2019 1323   BASOSABS 0.1 01/29/2019 1323   CMP Latest Ref Rng & Units 01/29/2019 04/15/2017 04/13/2016  Glucose 70 - 99 mg/dL 90 109(H) 105(H)  BUN 8 - 23 mg/dL _0 Creatinine 0.44 - 1.00 mg/dL 0.61 0.71 0.66  Sodium 135 - 145 mmol/L  141 142 139  Potassium 3.5 - 5.1 mmol/L 3.8 4.5 4.1  Chloride 98 - 111 mmol/L 106 103 106  CO2 22 - 32 mmol/L _0 Calcium 8.9 - 10.3 mg/dL 9.2 9.8 9.0  Total Protein 6.5 - 8.1 g/dL 7.5 7.7 7.8  Total Bilirubin 0.3 - 1.2 mg/dL 0.4 0.7 0.6  Alkaline Phos 38 - 126 U/L 88 82 86  AST 15 - 41 U/L _1 ALT 0 - 44 U/L _2 DIAGNOSTIC IMAGING:  I have independently reviewed the scans and discussed with the patient.   I have reviewed Venita Lick LPN's note and agree with the documentation.  I personally performed a face-to-face visit, made revisions and my assessment and plan is as follows.    ASSESSMENT & PLAN:   Adenocarcinoma of left breast 1.  Left kidney mass: - CT of the abdomen on 09/05/2018 shows 6.4 cm heterogeneous enhancing mass in the mid and lower pole of the left kidney consistent with RCC.  Lesion extends into the central sinus fat generating mass-effect on the renal pelvis and mid renal and lower renal collecting systems.  No evidence of renal vein involvement. - She is a 1 pack/day smoker, started smoking reportedly at age 72. - She was not evaluated by urology yet.  I have ordered restaging CT CAP and a bone scan. -I will see her back after the scans to discuss the results and potentially make a referral to urology.  2.  Stage II (T2 N0 M0) left breast cancer: - Diagnosed in 1990, treated on NSABP-19 protocol with CMF followed by 5 years of tamoxifen. - Left mastectomy site is within normal limits. - Right breast mammogram on 01/22/2018 was BI-RADS 1. - We will schedule her for right breast mammogram in June 2020.   Total time spent is 25 minutes with more than 50% of the time spent face-to-face discussing scan results, further work-up and coordination of care.  Orders placed this encounter:  Orders Placed This Encounter  Procedures  . CT Chest W Contrast  . CT Abdomen Pelvis W Contrast  . DG Bone Survey Met  . NM Bone Scan Whole Body  . CBC  with Differential  . Comprehensive metabolic panel  . Lactate dehydrogenase      Derek Jack, MD [place] [phone]

## 2019-01-29 NOTE — Patient Instructions (Addendum)
[facility] at [facility] Discharge Instructions  You were seen today by Dr. Katragadda. He went over your recent lab results. He will see you back in  for labs and follow up.   Thank you for choosing [facility] at [facility] to provide your oncology and hematology care.  To afford each patient quality time with our provider, please arrive at least 15 minutes before your scheduled appointment time.   If you have a lab appointment with the Cancer Center please come in thru the  Main Entrance and check in at the main information desk  You need to re-schedule your appointment should you arrive 10 or more minutes late.  We strive to give you quality time with our providers, and arriving late affects you and other patients whose appointments are after yours.  Also, if you no show three or more times for appointments you may be dismissed from the clinic at the providers discretion.     Again, thank you for choosing [facility].  Our hope is that these requests will decrease the amount of time that you wait before being seen by our physicians.       _____________________________________________________________  Should you have questions after your visit to [facility], please contact our office at [phone] between the hours of 8:00 a.m. and 4:30 p.m.  Voicemails left after 4:00 p.m. will not be returned until the following business day.  For prescription refill requests, have your pharmacy contact our office and allow 72 hours.    Cancer Center Support Programs:   > Cancer Support Group  2nd Tuesday of the month 1pm-2pm, Journey Room    

## 2019-01-29 NOTE — Assessment & Plan Note (Signed)
1.  Left kidney mass: - CT of the abdomen on 09/05/2018 shows 6.4 cm heterogeneous enhancing mass in the mid and lower pole of the left kidney consistent with RCC.  Lesion extends into the central sinus fat generating mass-effect on the renal pelvis and mid renal and lower renal collecting systems.  No evidence of renal vein involvement. - She is a 1 pack/day smoker, started smoking reportedly at age 78. - She was not evaluated by urology yet.  I have ordered restaging CT CAP and a bone scan. -I will see her back after the scans to discuss the results and potentially make a referral to urology.  2.  Stage II (T2 N0 M0) left breast cancer: - Diagnosed in 1990, treated on NSABP-19 protocol with CMF followed by 5 years of tamoxifen. - Left mastectomy site is within normal limits. - Right breast mammogram on 01/22/2018 was BI-RADS 1. - We will schedule her for right breast mammogram in June 2020.

## 2019-02-05 ENCOUNTER — Other Ambulatory Visit: Payer: Self-pay

## 2019-02-05 ENCOUNTER — Ambulatory Visit (HOSPITAL_COMMUNITY)
Admission: RE | Admit: 2019-02-05 | Discharge: 2019-02-05 | Disposition: A | Discharge status: Home / Self Care | Payer: Medicare PPO | Source: Ambulatory Visit | Attending: Internal Medicine | Admitting: Internal Medicine

## 2019-02-05 DIAGNOSIS — Z1231 Encounter for screening mammogram for malignant neoplasm of breast: Secondary | ICD-10-CM

## 2019-02-12 ENCOUNTER — Encounter (HOSPITAL_COMMUNITY): Payer: Self-pay

## 2019-02-12 ENCOUNTER — Ambulatory Visit (HOSPITAL_COMMUNITY)
Admission: RE | Admit: 2019-02-12 | Discharge: 2019-02-12 | Disposition: A | Discharge status: Home / Self Care | Payer: Medicare PPO | Source: Ambulatory Visit | Attending: Hematology | Admitting: Hematology

## 2019-02-12 ENCOUNTER — Other Ambulatory Visit: Payer: Self-pay

## 2019-02-12 ENCOUNTER — Other Ambulatory Visit (HOSPITAL_COMMUNITY): Payer: Self-pay | Admitting: Hematology

## 2019-02-12 DIAGNOSIS — N2889 Other specified disorders of kidney and ureter: Secondary | ICD-10-CM | POA: Insufficient documentation

## 2019-02-12 DIAGNOSIS — J439 Emphysema, unspecified: Secondary | ICD-10-CM | POA: Insufficient documentation

## 2019-02-12 DIAGNOSIS — R222 Localized swelling, mass and lump, trunk: Secondary | ICD-10-CM | POA: Insufficient documentation

## 2019-02-12 DIAGNOSIS — R911 Solitary pulmonary nodule: Secondary | ICD-10-CM | POA: Insufficient documentation

## 2019-02-12 MED ORDER — TECHNETIUM TC 99M MEDRONATE IV KIT
20.0000 | PACK | Freq: Once | INTRAVENOUS | Status: AC | PRN
  Administered 2019-02-12: 10:00:00 21 via INTRAVENOUS

## 2019-02-12 MED ORDER — IOHEXOL 300 MG/ML  SOLN
100.0000 mL | Freq: Once | INTRAMUSCULAR | Status: AC | PRN
  Administered 2019-02-12: 11:00:00 100 mL via INTRAVENOUS

## 2019-02-13 ENCOUNTER — Other Ambulatory Visit: Payer: Self-pay

## 2019-02-16 ENCOUNTER — Other Ambulatory Visit: Payer: Self-pay

## 2019-02-16 ENCOUNTER — Encounter (HOSPITAL_COMMUNITY): Payer: Self-pay | Admitting: Hematology

## 2019-02-16 ENCOUNTER — Inpatient Hospital Stay (HOSPITAL_COMMUNITY): Payer: Medicare PPO | Attending: Hematology | Admitting: Hematology

## 2019-02-16 DIAGNOSIS — Z801 Family history of malignant neoplasm of trachea, bronchus and lung: Secondary | ICD-10-CM | POA: Insufficient documentation

## 2019-02-16 DIAGNOSIS — Z9012 Acquired absence of left breast and nipple: Secondary | ICD-10-CM | POA: Diagnosis not present

## 2019-02-16 DIAGNOSIS — N2889 Other specified disorders of kidney and ureter: Secondary | ICD-10-CM

## 2019-02-16 DIAGNOSIS — Z853 Personal history of malignant neoplasm of breast: Secondary | ICD-10-CM | POA: Insufficient documentation

## 2019-02-16 DIAGNOSIS — F17210 Nicotine dependence, cigarettes, uncomplicated: Secondary | ICD-10-CM | POA: Insufficient documentation

## 2019-02-16 DIAGNOSIS — Z800 Family history of malignant neoplasm of digestive organs: Secondary | ICD-10-CM | POA: Insufficient documentation

## 2019-02-16 NOTE — Patient Instructions (Addendum)
[place] at [facility] Discharge Instructions  You were seen today by Dr. Delton Coombes. He went over your recent lab and scan results. He will see you back after surgery for labs and follow up.   Thank you for choosing [place] at [facility] to provide your oncology and hematology care.  To afford each patient quality time with our provider, please arrive at least 15 minutes before your scheduled appointment time.   If you have a lab appointment with the [place] please come in thru the  Main Entrance and check in at the main information desk  You need to re-schedule your appointment should you arrive 10 or more minutes late.  We strive to give you quality time with our providers, and arriving late affects you and other patients whose appointments are after yours.  Also, if you no show three or more times for appointments you may be dismissed from the clinic at the providers discretion.     Again, thank you for choosing [facility].  Our hope is that these requests will decrease the amount of time that you wait before being seen by our physicians.       _____________________________________________________________  Should you have questions after your visit to [facility], please contact our office at [phone] between the hours of 8:00 a.m. and 4:30 p.m.  Voicemails left after 4:00 p.m. will not be returned until the following business day.  For prescription refill requests, have your pharmacy contact our office and allow 72 hours.    Cancer Center Support Programs:   > Cancer Support Group  2nd Tuesday of the month 1pm-2pm, Journey Room

## 2019-02-16 NOTE — Assessment & Plan Note (Signed)
1.  Left kidney mass: - CT of the abdomen on 09/05/2018 shows 6.4 cm heterogeneous enhancing mass in the mid and lower pole of the left kidney consistent with RCC.  Lesion extends into the central sinus fat generating mass-effect on the renal pelvis and mid renal and lower renal collecting systems.  No evidence of renal vein involvement. - She is a 1 pack/day smoker, started smoking at age 78. - I reviewed the results of the CT CAP on 02/12/2019 which shows 6.3 x 5.9 x 6.4 cm heterogeneously enhancing mass in the lower pole of the left kidney.  This extends into the central sinus.  No filling defect in the left renal vein to suggest tumor involvement.  Exophytic tumor extension anteriorly displaces adjacent small bowel loops.  6 mm irregular left upper lobe pulmonary nodule is nonspecific but new since 05/02/2018.  6 mm low-density subcapsular lesion in the inferior right liver, stable since 09/05/2018. - Whole-body bone scan dated 02/12/2019 shows no bone metastasis. - I have recommended evaluation by Dr. Alinda Money for left nephrectomy. -I will see her back after the surgery.  2.  Stage II (T2 N0 M0) left breast cancer: - Diagnosed in 1990, treated on NSABP-19 protocol with CMF followed by 5 years of tamoxifen. - Left mastectomy site is within normal limits. - Right breast mammogram on 02/05/2019 was BI-RADS Category 1.

## 2019-02-16 NOTE — Progress Notes (Signed)
[name], [place] [address]   CLINIC:  Medical Oncology/Hematology  PCP:  [name], [name] [address] [phone]   REASON FOR VISIT:  Follow-up for Adenocarcinoma of left breast and left kidney mass.     CANCER STAGING: Cancer Staging Adenocarcinoma of left breast Staging form: Breast, AJCC 7th Edition - Clinical: Stage IIA (T2, N0, cM0) - Signed by Baird Cancer, PA on 04/16/2012    INTERVAL HISTORY:  [name] 78 y.o. female seen for follow-up of left kidney mass.  Her appetite and energy levels are 100%.  Denies any hematuria.  Denies any fevers, night sweats or weight loss.  Denies any history of thrombosis.  No nausea, vomiting, diarrhea or constipation was reported.  Denies any recent hospitalizations.  No bleeding episodes reported.   REVIEW OF SYSTEMS:  Review of Systems  Neurological: Negative for dizziness.  All other systems reviewed and are negative.    PAST MEDICAL/SURGICAL HISTORY:  Past Medical History:  Diagnosis Date  . Adenocarcinoma of left breast 04/16/2012   Stage II (T2 N0 M0) left-sided adenocarcinoma breast diagnosed in 1990 by Dr. Romona Curls and treated by Dr. Juanita Craver on NSAB PPD-19 with CMF followed by 5 years of tamoxifen and she remains disease free thus far.   . Breast cancer ([place])    left breast/mastectomy/dx approx1990  . Gall bladder stones   . Grave's disease   . High triglycerides   . Nerve compression    pinched in neck   Past Surgical History:  Procedure Laterality Date  . CARPAL TUNNEL RELEASE     rt. hand  . cataract surgery     bilateral  . MASTECTOMY     left.     SOCIAL HISTORY:  Social History   Socioeconomic History  . Marital status: Divorced    Spouse name: Not on file  . Number of children: Not on file  . Years of education: Not on file  . Highest education level: Not on file  Occupational History  . Not on file   Social Needs  . Financial resource strain: Not on file  . Food insecurity    Worry: Not on file    Inability: Not on file  . Transportation needs    Medical: Not on file    Non-medical: Not on file  Tobacco Use  . Smoking status: Current Every Day Smoker    Packs/day: 1.00    Years: 60.00    Pack years: 60.00  . Smokeless tobacco: Never Used  Substance and Sexual Activity  . Alcohol use: No  . Drug use: No  . Sexual activity: Not on file  Lifestyle  . Physical activity    Days per week: Not on file    Minutes per session: Not on file  . Stress: Not on file  Relationships  . Social Herbalist on phone: Not on file    Gets together: Not on file    Attends religious service: Not on file    Active member of club or organization: Not on file    Attends meetings of clubs or organizations: Not on file    Relationship status: Not on file  . Intimate partner violence    Fear of current or ex partner: Not on file    Emotionally abused: Not on file    Physically abused: Not on file    Forced sexual activity: Not  on file  Other Topics Concern  . Not on file  Social History Narrative  . Not on file    FAMILY HISTORY:  Family History  Problem Relation Age of Onset  . Cancer Father        lung cancer  . Cancer Brother        colon cancer  . Cancer Other        lung cancer/died age 56    CURRENT MEDICATIONS:  Outpatient Encounter Medications as of 02/16/2019  Medication Sig  . aspirin 325 MG tablet Take 325 mg by mouth as needed.    . fish oil-omega-3 fatty acids 1000 MG capsule Take 1,200 mg by mouth daily. Takes 2 daily  . levothyroxine (SYNTHROID, LEVOTHROID) 100 MCG tablet Take 100 mcg by mouth daily before breakfast.   . meclizine (ANTIVERT) 50 MG tablet Take 25 mg by mouth 3 (three) times daily as needed.    . Multiple Vitamins-Minerals (CENTRUM SILVER ULTRA WOMENS) TABS Take by mouth daily.    . simvastatin (ZOCOR) 20 MG tablet Take 20 mg by mouth at  bedtime.     No facility-administered encounter medications on file as of 02/16/2019.     ALLERGIES:  No Known Allergies   PHYSICAL EXAM:  ECOG Performance status: 1  Vitals:   02/16/19 1434  BP: (!) 141/59  Pulse: 73  Resp: 20  Temp: 98.1 F (36.7 C)  SpO2: 97%   Filed Weights   02/16/19 1434  Weight: 131 lb (59.4 kg)    Physical Exam Vitals signs reviewed.  Constitutional:      Appearance: Normal appearance.  Cardiovascular:     Rate and Rhythm: Normal rate and regular rhythm.     Heart sounds: Normal heart sounds.  Pulmonary:     Effort: Pulmonary effort is normal.     Breath sounds: Normal breath sounds.  Abdominal:     General: There is no distension.     Palpations: Abdomen is soft. There is no mass.  Musculoskeletal:        General: No swelling.  Neurological:     General: No focal deficit present.     Mental Status: She is alert and oriented to person, place, and time.  Psychiatric:        Mood and Affect: Mood normal.        Behavior: Behavior normal.      LABORATORY DATA:  I have reviewed the labs as listed.  CBC    Component Value Date/Time   WBC 7.2 01/29/2019 1323   RBC 3.90 01/29/2019 1323   HGB 10.9 (L) 01/29/2019 1323   HCT 35.8 (L) 01/29/2019 1323   PLT 330 01/29/2019 1323   MCV 91.8 01/29/2019 1323   MCH 27.9 01/29/2019 1323   MCHC 30.4 01/29/2019 1323   RDW 16.0 (H) 01/29/2019 1323   LYMPHSABS 2.2 01/29/2019 1323   MONOABS 0.5 01/29/2019 1323   EOSABS 0.2 01/29/2019 1323   BASOSABS 0.1 01/29/2019 1323   CMP Latest Ref Rng & Units 01/29/2019 04/15/2017 04/13/2016  Glucose 70 - 99 mg/dL 90 109(H) 105(H)  BUN 8 - 23 mg/dL 21 10 12   Creatinine 0.44 - 1.00 mg/dL 0.61 0.71 0.66  Sodium 135 - 145 mmol/L 141 142 139  Potassium 3.5 - 5.1 mmol/L 3.8 4.5 4.1  Chloride 98 - 111 mmol/L 106 103 106  CO2 22 - 32 mmol/L 24 28 26   Calcium 8.9 - 10.3 mg/dL 9.2 9.8 9.0  Total Protein 6.5 - 8.1  g/dL 7.5 7.7 7.8  Total Bilirubin 0.3 - 1.2  mg/dL 0.4 0.7 0.6  Alkaline Phos 38 - 126 U/L 88 82 86  AST 15 - 41 U/L 18 21 23   ALT 0 - 44 U/L 16 14 18        DIAGNOSTIC IMAGING:  I have independently reviewed the scans and discussed with the patient.   I have reviewed Venita Lick LPN's note and agree with the documentation.  I personally performed a face-to-face visit, made revisions and my assessment and plan is as follows.    ASSESSMENT & PLAN:   Left kidney mass 1.  Left kidney mass: - CT of the abdomen on 09/05/2018 shows 6.4 cm heterogeneous enhancing mass in the mid and lower pole of the left kidney consistent with RCC.  Lesion extends into the central sinus fat generating mass-effect on the renal pelvis and mid renal and lower renal collecting systems.  No evidence of renal vein involvement. - She is a 1 pack/day smoker, started smoking at age 81. - I reviewed the results of the CT CAP on 02/12/2019 which shows 6.3 x 5.9 x 6.4 cm heterogeneously enhancing mass in the lower pole of the left kidney.  This extends into the central sinus.  No filling defect in the left renal vein to suggest tumor involvement.  Exophytic tumor extension anteriorly displaces adjacent small bowel loops.  6 mm irregular left upper lobe pulmonary nodule is nonspecific but new since 05/02/2018.  6 mm low-density subcapsular lesion in the inferior right liver, stable since 09/05/2018. - Whole-body bone scan dated 02/12/2019 shows no bone metastasis. - I have recommended evaluation by Dr. Alinda Money for left nephrectomy. -I will see her back after the surgery.  2.  Stage II (T2 N0 M0) left breast cancer: - Diagnosed in 1990, treated on NSABP-19 protocol with CMF followed by 5 years of tamoxifen. - Left mastectomy site is within normal limits. - Right breast mammogram on 02/05/2019 was BI-RADS Category 1.    Total time spent is 25 minutes with more than 50% of the time spent face-to-face discussing scan results, further work-up and coordination of care.   Orders placed this encounter:  No orders of the defined types were placed in this encounter.     Derek Jack, MD [place] [phone]

## 2019-02-23 ENCOUNTER — Other Ambulatory Visit: Payer: Self-pay | Admitting: Urology

## 2019-03-19 NOTE — Patient Instructions (Signed)
YOU NEED TO HAVE A COVID 19 TEST ON_Saturday 7/18/20______ @__11 :00_____, THIS TEST MUST BE DONE BEFORE SURGERY, COME TO [facility] ENTRANCE. ONCE YOUR COVID TEST IS COMPLETED, PLEASE BEGIN THE QUARANTINE INSTRUCTIONS AS OUTLINED IN YOUR HANDOUT.                [name]    Your procedure is scheduled on: Wednesday 03/25/19   Report to [facility] Main  Entrance  Report to admitting at 7:13 am      Call this number if you have problems the morning of surgery [phone]     Remember: Do not eat food or drink liquids :After Midnight.           BRUSH YOUR TEETH MORNING OF SURGERY AND RINSE YOUR MOUTH OUT, NO CHEWING GUM CANDY OR MINTS.     Take these medicines the morning of surgery with A SIP OF WATER: Synthroid                                You may not have any metal on your body including hair pins and              piercings               Do not wear jewelry, make-up, lotions, powders or perfumes, deodorant             Do not wear nail polish.  Do not shave  48 hours prior to surgery.              Do not bring valuables to the hospital. [place].  Contacts, dentures or bridgework may not be worn into surgery.      Name and phone number of your driver:  Special Instructions: N/A              Please read over the following fact sheets you were given: _____________________________________________________________________             [facility] - Preparing for Surgery Before surgery, you can play an important role.   Because skin is not sterile, your skin needs to be as free of germs as possible .  You can reduce the number of germs on your skin by washing with CHG (chlorahexidine gluconate) soap before surgery .  CHG is an antiseptic cleaner which kills germs and bonds with the skin to continue killing germs even after washing. Please DO NOT use if you have an allergy to CHG  or antibacterial soaps.   If your skin becomes reddened/irritated stop using the CHG and inform your nurse when you arrive at Short Stay. Do not shave (including legs and underarms) for at least 48 hours prior to the first CHG shower.   Please follow these instructions carefully:   1.  Shower with CHG Soap the night before surgery and the  morning of Surgery.  2.  If you choose to wash your hair, wash your hair first as usual with your  normal  shampoo.  3.  After you shampoo, rinse your hair and body thoroughly to remove the  shampoo.  4.  Use CHG as you would any other liquid soap.  You can apply chg directly  to the skin and wash                       Gently with a scrungie or clean washcloth.  5.  Apply the CHG Soap to your body ONLY FROM THE NECK DOWN.   Do not use on face/ open                           Wound or open sores. Avoid contact with eyes, ears mouth and genitals (private parts).                       Wash face,  Genitals (private parts) with your normal soap.             6.  Wash thoroughly, paying special attention to the area where your surgery  will be performed.  7.  Thoroughly rinse your body with warm water from the neck down.  8.  DO NOT shower/wash with your normal soap after using and rinsing off  the CHG Soap.             9.  Pat yourself dry with a clean towel.            10.  Wear clean pajamas.            11.  Place clean sheets on your bed the night of your first shower and do not  sleep with pets.  Day of Surgery : Do not apply any lotions/deodorants the morning of surgery.  Please wear clean clothes to the hospital/surgery center.   FAILURE TO FOLLOW THESE INSTRUCTIONS MAY RESULT IN THE CANCELLATION OF YOUR SURGERY PATIENT SIGNATURE_________________________________  NURSE SIGNATURE__________________________________  ________________________________________________________________________

## 2019-03-21 ENCOUNTER — Other Ambulatory Visit (HOSPITAL_COMMUNITY)
Admission: RE | Admit: 2019-03-21 | Discharge: 2019-03-21 | Disposition: A | Discharge status: Home / Self Care | Payer: Medicare PPO | Source: Ambulatory Visit | Attending: Urology | Admitting: Urology

## 2019-03-21 DIAGNOSIS — Z1159 Encounter for screening for other viral diseases: Secondary | ICD-10-CM | POA: Diagnosis present

## 2019-03-21 LAB — SARS CORONAVIRUS 2 (TAT 6-24 HRS): SARS Coronavirus 2: NEGATIVE

## 2019-03-23 ENCOUNTER — Encounter (HOSPITAL_COMMUNITY): Payer: Self-pay

## 2019-03-23 ENCOUNTER — Other Ambulatory Visit: Payer: Self-pay

## 2019-03-23 ENCOUNTER — Encounter (HOSPITAL_COMMUNITY)
Admission: RE | Admit: 2019-03-23 | Discharge: 2019-03-23 | Disposition: A | Discharge status: Home / Self Care | Payer: Medicare PPO | Source: Ambulatory Visit | Attending: Urology | Admitting: Urology

## 2019-03-23 DIAGNOSIS — Z01818 Encounter for other preprocedural examination: Secondary | ICD-10-CM | POA: Insufficient documentation

## 2019-03-23 DIAGNOSIS — F17200 Nicotine dependence, unspecified, uncomplicated: Secondary | ICD-10-CM | POA: Insufficient documentation

## 2019-03-23 DIAGNOSIS — N2889 Other specified disorders of kidney and ureter: Secondary | ICD-10-CM | POA: Insufficient documentation

## 2019-03-23 DIAGNOSIS — I10 Essential (primary) hypertension: Secondary | ICD-10-CM | POA: Insufficient documentation

## 2019-03-23 DIAGNOSIS — I444 Left anterior fascicular block: Secondary | ICD-10-CM | POA: Insufficient documentation

## 2019-03-23 DIAGNOSIS — I4510 Unspecified right bundle-branch block: Secondary | ICD-10-CM | POA: Insufficient documentation

## 2019-03-23 DIAGNOSIS — I452 Bifascicular block: Secondary | ICD-10-CM | POA: Insufficient documentation

## 2019-03-23 DIAGNOSIS — R9431 Abnormal electrocardiogram [ECG] [EKG]: Secondary | ICD-10-CM | POA: Insufficient documentation

## 2019-03-23 HISTORY — DX: Personal history of urinary calculi: Z87.442

## 2019-03-23 LAB — COMPREHENSIVE METABOLIC PANEL
ALT: 12 U/L (ref 0–44)
AST: 18 U/L (ref 15–41)
Albumin: 4.5 g/dL (ref 3.5–5.0)
Alkaline Phosphatase: 86 U/L (ref 38–126)
Anion gap: 9 (ref 5–15)
BUN: 14 mg/dL (ref 8–23)
CO2: 25 mmol/L (ref 22–32)
Calcium: 9.5 mg/dL (ref 8.9–10.3)
Chloride: 109 mmol/L (ref 98–111)
Creatinine, Ser: 0.61 mg/dL (ref 0.44–1.00)
GFR calc Af Amer: >60 mL/min (ref >60)
GFR calc non Af Amer: >60 mL/min (ref >60)
Glucose, Bld: 94 mg/dL (ref 70–99)
Potassium: 4.7 mmol/L (ref 3.5–5.1)
Sodium: 143 mmol/L (ref 135–145)
Total Bilirubin: 0.3 mg/dL (ref 0.3–1.2)
Total Protein: 7.8 g/dL (ref 6.5–8.1)

## 2019-03-23 LAB — CBC
HCT: 43.5 % (ref 36.0–46.0)
Hemoglobin: 13.5 g/dL (ref 12.0–15.0)
MCH: 28.2 pg (ref 26.0–34.0)
MCHC: 31 g/dL (ref 30.0–36.0)
MCV: 90.8 fL (ref 80.0–100.0)
Platelets: 287 10*3/uL (ref 150–400)
RBC: 4.79 MIL/uL (ref 3.87–5.11)
RDW: 15.2 % (ref 11.5–15.5)
WBC: 6.8 10*3/uL (ref 4.0–10.5)
nRBC: 0 % (ref 0.0–0.2)

## 2019-03-23 LAB — PROTIME-INR
INR: 1 (ref 0.8–1.2)
Prothrombin Time: 12.9 seconds (ref 11.4–15.2)

## 2019-03-23 LAB — ABO/RH: ABO/RH(D): B POS

## 2019-03-24 NOTE — Progress Notes (Signed)
Anesthesia Chart Review   Case: [id number] Date/Time: 03/25/19 0858   Procedure: LAPAROSCOPIC RADICAL NEPHRECTOMY (Left )   Anesthesia type: General   Pre-op diagnosis: LEFT RENAL MASS   Location: [facility]OM 03 / [facility]   Surgeon: Ceasar Mons, MD      DISCUSSION:78 y.o. current every day smoker (60 pack years) with h/o Grave's disease, left breast cancer, left renal mass scheduled for above procedure 03/25/2019 with Dr. Harrell Gave Lovena Neighbours.    EKG reviewed with Dr. Sabra Heck, comparison from 2007 on chart.  Ok to proceed with planned procedure barring acute status change.  VS: BP (!) 142/66 (BP Location: Right Arm)   Pulse 73   Temp 36.8 C (Oral)   Resp 18   Ht 5\' 5"  (1.651 m)   Wt 59.4 kg   SpO2 95%   BMI 21.81 kg/m   PROVIDERS: Denyce Robert, FNP is PCP    LABS: Labs reviewed: Acceptable for surgery. (all labs ordered are listed, but only abnormal results are displayed)  Labs Reviewed  CBC  COMPREHENSIVE METABOLIC PANEL  PROTIME-INR  TYPE AND SCREEN  ABO/RH     IMAGES:   EKG: 03/23/2019 Rate 70 bpm Normal sinus rhythm  Possible left atrial enlargement Right bundle branch block  Left anterior fascicular block  Septal infarct, age undetermined Abnormal ECG Compared to previous, RBBB now complete  CV:  Past Medical History:  Diagnosis Date  . Adenocarcinoma of left breast 04/16/2012   Stage II (T2 N0 M0) left-sided adenocarcinoma breast diagnosed in 1990 by Dr. Romona Curls and treated by Dr. Juanita Craver on NSAB PPD-19 with CMF followed by 5 years of tamoxifen and she remains disease free thus far.   . Breast cancer ([place])    left breast/mastectomy/dx approx1990  . Gall bladder stones   . Grave's disease   . High triglycerides   . History of kidney stones    small stone  rt   . Nerve compression    pinched in neck  . Pneumonia 1990    Past Surgical History:  Procedure Laterality Date  . CARPAL TUNNEL RELEASE     rt. hand  . cataract surgery      bilateral  . EYE SURGERY    . MASTECTOMY     left.  . TONSILLECTOMY      MEDICATIONS: . aspirin 325 MG tablet  . levothyroxine (SYNTHROID, LEVOTHROID) 100 MCG tablet  . meclizine (ANTIVERT) 50 MG tablet  . Multiple Vitamins-Minerals (CENTRUM SILVER ULTRA WOMENS) TABS  . simvastatin (ZOCOR) 20 MG tablet   No current facility-administered medications for this encounter.     Maia Plan [facility] Pre-Surgical Testing [phone] 03/24/19  3:58 PM

## 2019-03-25 ENCOUNTER — Other Ambulatory Visit: Payer: Self-pay

## 2019-03-25 ENCOUNTER — Encounter (HOSPITAL_COMMUNITY)
Admission: RE | Disposition: A | Discharge status: Home / Self Care | Payer: Self-pay | Source: Home / Self Care | Attending: Urology

## 2019-03-25 ENCOUNTER — Encounter (HOSPITAL_COMMUNITY): Payer: Self-pay | Admitting: Emergency Medicine

## 2019-03-25 ENCOUNTER — Inpatient Hospital Stay (HOSPITAL_COMMUNITY)
Admission: RE | Admit: 2019-03-25 | Discharge: 2019-03-26 | DRG: 658 | Disposition: A | Discharge status: Home / Self Care | Payer: Medicare PPO | Attending: Urology | Admitting: Urology

## 2019-03-25 ENCOUNTER — Inpatient Hospital Stay (HOSPITAL_COMMUNITY): Payer: Medicare PPO | Admitting: Anesthesiology

## 2019-03-25 ENCOUNTER — Inpatient Hospital Stay (HOSPITAL_COMMUNITY): Payer: Medicare PPO | Admitting: Physician Assistant

## 2019-03-25 DIAGNOSIS — E781 Pure hyperglyceridemia: Secondary | ICD-10-CM | POA: Diagnosis present

## 2019-03-25 DIAGNOSIS — Z1159 Encounter for screening for other viral diseases: Secondary | ICD-10-CM

## 2019-03-25 DIAGNOSIS — Z9012 Acquired absence of left breast and nipple: Secondary | ICD-10-CM

## 2019-03-25 DIAGNOSIS — C642 Malignant neoplasm of left kidney, except renal pelvis: Principal | ICD-10-CM | POA: Diagnosis present

## 2019-03-25 DIAGNOSIS — Z853 Personal history of malignant neoplasm of breast: Secondary | ICD-10-CM

## 2019-03-25 DIAGNOSIS — Z7982 Long term (current) use of aspirin: Secondary | ICD-10-CM

## 2019-03-25 DIAGNOSIS — Z79899 Other long term (current) drug therapy: Secondary | ICD-10-CM

## 2019-03-25 DIAGNOSIS — F17210 Nicotine dependence, cigarettes, uncomplicated: Secondary | ICD-10-CM | POA: Diagnosis present

## 2019-03-25 DIAGNOSIS — N2889 Other specified disorders of kidney and ureter: Secondary | ICD-10-CM | POA: Diagnosis present

## 2019-03-25 DIAGNOSIS — Z79890 Hormone replacement therapy (postmenopausal): Secondary | ICD-10-CM | POA: Diagnosis not present

## 2019-03-25 HISTORY — PX: LAPAROSCOPIC NEPHRECTOMY: SHX1930

## 2019-03-25 LAB — TYPE AND SCREEN
ABO/RH(D): B POS
Antibody Screen: NEGATIVE

## 2019-03-25 LAB — HEMOGLOBIN AND HEMATOCRIT, BLOOD
HCT: 39.8 % (ref 36.0–46.0)
Hemoglobin: 11.9 g/dL — ABNORMAL LOW (ref 12.0–15.0)

## 2019-03-25 SURGERY — NEPHRECTOMY, RADICAL, LAPAROSCOPIC, ADULT
Anesthesia: General | Site: Abdomen | Laterality: Left

## 2019-03-25 MED ORDER — ACETAMINOPHEN 160 MG/5ML PO SOLN: 1000.0000 mg | Freq: Once | ORAL | Status: DC | PRN

## 2019-03-25 MED ORDER — OXYCODONE HCL 5 MG PO TABS: 5.0000 mg | ORAL_TABLET | ORAL | Status: DC | PRN

## 2019-03-25 MED ORDER — LACTATED RINGERS IR SOLN
Status: DC | PRN
  Administered 2019-03-25: 1000 mL

## 2019-03-25 MED ORDER — BUPIVACAINE-EPINEPHRINE 0.25% -1:200000 IJ SOLN
INTRAMUSCULAR | Status: DC | PRN
  Administered 2019-03-25: 30 mL

## 2019-03-25 MED ORDER — DIPHENHYDRAMINE HCL 12.5 MG/5ML PO ELIX: 12.5000 mg | ORAL_SOLUTION | Freq: Four times a day (QID) | ORAL | Status: DC | PRN

## 2019-03-25 MED ORDER — ACETAMINOPHEN 10 MG/ML IV SOLN: 1000.0000 mg | Freq: Once | INTRAVENOUS | Status: DC | PRN

## 2019-03-25 MED ORDER — MORPHINE SULFATE (PF) 4 MG/ML IV SOLN: 2.0000 mg | INTRAVENOUS | Status: DC | PRN

## 2019-03-25 MED ORDER — PROPOFOL 10 MG/ML IV BOLUS
INTRAVENOUS | Status: AC
  Filled 2019-03-25: qty 20

## 2019-03-25 MED ORDER — DEXAMETHASONE SODIUM PHOSPHATE 10 MG/ML IJ SOLN
INTRAMUSCULAR | Status: DC | PRN
  Administered 2019-03-25: 8 mg via INTRAVENOUS

## 2019-03-25 MED ORDER — PHENYLEPHRINE 40 MCG/ML (10ML) SYRINGE FOR IV PUSH (FOR BLOOD PRESSURE SUPPORT)
PREFILLED_SYRINGE | INTRAVENOUS | Status: DC | PRN
  Administered 2019-03-25: 80 ug via INTRAVENOUS

## 2019-03-25 MED ORDER — ONDANSETRON HCL 4 MG/2ML IJ SOLN
INTRAMUSCULAR | Status: AC
  Filled 2019-03-25: qty 2

## 2019-03-25 MED ORDER — FENTANYL CITRATE (PF) 100 MCG/2ML IJ SOLN
INTRAMUSCULAR | Status: DC | PRN
  Administered 2019-03-25: 50 ug via INTRAVENOUS
  Administered 2019-03-25: 25 ug via INTRAVENOUS
  Administered 2019-03-25: 50 ug via INTRAVENOUS
  Administered 2019-03-25: 25 ug via INTRAVENOUS
  Administered 2019-03-25: 100 ug via INTRAVENOUS

## 2019-03-25 MED ORDER — SUGAMMADEX SODIUM 200 MG/2ML IV SOLN
INTRAVENOUS | Status: DC | PRN
  Administered 2019-03-25: 120 mg via INTRAVENOUS

## 2019-03-25 MED ORDER — ACETAMINOPHEN 10 MG/ML IV SOLN
INTRAVENOUS | Status: AC
  Filled 2019-03-25: qty 100

## 2019-03-25 MED ORDER — ROCURONIUM BROMIDE 10 MG/ML (PF) SYRINGE
PREFILLED_SYRINGE | INTRAVENOUS | Status: DC | PRN
  Administered 2019-03-25: 50 mg via INTRAVENOUS
  Administered 2019-03-25: 10 mg via INTRAVENOUS

## 2019-03-25 MED ORDER — PROPOFOL 10 MG/ML IV BOLUS
INTRAVENOUS | Status: DC | PRN
  Administered 2019-03-25: 90 mg via INTRAVENOUS

## 2019-03-25 MED ORDER — LACTATED RINGERS IV SOLN
INTRAVENOUS | Status: DC
  Administered 2019-03-25 (×2): via INTRAVENOUS
  Administered 2019-03-25: 1000 mL via INTRAVENOUS

## 2019-03-25 MED ORDER — DEXAMETHASONE SODIUM PHOSPHATE 10 MG/ML IJ SOLN
INTRAMUSCULAR | Status: AC
  Filled 2019-03-25: qty 1

## 2019-03-25 MED ORDER — LACTATED RINGERS IV SOLN
INTRAVENOUS | Status: DC
  Administered 2019-03-25 – 2019-03-26 (×2): via INTRAVENOUS

## 2019-03-25 MED ORDER — LIDOCAINE 2% (20 MG/ML) 5 ML SYRINGE
INTRAMUSCULAR | Status: DC | PRN
  Administered 2019-03-25: 50 mg via INTRAVENOUS

## 2019-03-25 MED ORDER — CEFAZOLIN SODIUM-DEXTROSE 2-4 GM/100ML-% IV SOLN
2.0000 g | Freq: Once | INTRAVENOUS | Status: AC
  Administered 2019-03-25: 2 g via INTRAVENOUS
  Filled 2019-03-25: qty 100

## 2019-03-25 MED ORDER — SENNOSIDES-DOCUSATE SODIUM 8.6-50 MG PO TABS
2.0000 | ORAL_TABLET | Freq: Every day | ORAL | Status: DC
  Administered 2019-03-25: 2 via ORAL
  Filled 2019-03-25: qty 2

## 2019-03-25 MED ORDER — ONDANSETRON HCL 4 MG/2ML IJ SOLN
4.0000 mg | INTRAMUSCULAR | Status: DC | PRN
  Administered 2019-03-26: 4 mg via INTRAVENOUS
  Filled 2019-03-25 (×2): qty 2

## 2019-03-25 MED ORDER — DIPHENHYDRAMINE HCL 50 MG/ML IJ SOLN: 12.5000 mg | Freq: Four times a day (QID) | INTRAMUSCULAR | Status: DC | PRN

## 2019-03-25 MED ORDER — FENTANYL CITRATE (PF) 100 MCG/2ML IJ SOLN
INTRAMUSCULAR | Status: AC
  Filled 2019-03-25: qty 2

## 2019-03-25 MED ORDER — BUPIVACAINE LIPOSOME 1.3 % IJ SUSP
20.0000 mL | Freq: Once | INTRAMUSCULAR | Status: AC
  Administered 2019-03-25: 20 mL
  Filled 2019-03-25: qty 20

## 2019-03-25 MED ORDER — LEVOTHYROXINE SODIUM 100 MCG PO TABS
100.0000 ug | ORAL_TABLET | Freq: Every day | ORAL | Status: DC
  Administered 2019-03-26: 100 ug via ORAL
  Filled 2019-03-25: qty 1

## 2019-03-25 MED ORDER — PHENYLEPHRINE 40 MCG/ML (10ML) SYRINGE FOR IV PUSH (FOR BLOOD PRESSURE SUPPORT)
PREFILLED_SYRINGE | INTRAVENOUS | Status: AC
  Filled 2019-03-25: qty 10

## 2019-03-25 MED ORDER — ROCURONIUM BROMIDE 10 MG/ML (PF) SYRINGE
PREFILLED_SYRINGE | INTRAVENOUS | Status: AC
  Filled 2019-03-25: qty 10

## 2019-03-25 MED ORDER — ACETAMINOPHEN 500 MG PO TABS
1000.0000 mg | ORAL_TABLET | Freq: Four times a day (QID) | ORAL | Status: AC
  Administered 2019-03-25 – 2019-03-26 (×4): 1000 mg via ORAL
  Filled 2019-03-25 (×4): qty 2

## 2019-03-25 MED ORDER — ACETAMINOPHEN 500 MG PO TABS: 1000.0000 mg | ORAL_TABLET | Freq: Once | ORAL | Status: DC | PRN

## 2019-03-25 MED ORDER — ONDANSETRON HCL 4 MG/2ML IJ SOLN
INTRAMUSCULAR | Status: DC | PRN
  Administered 2019-03-25: 4 mg via INTRAVENOUS

## 2019-03-25 MED ORDER — SIMVASTATIN 20 MG PO TABS
20.0000 mg | ORAL_TABLET | Freq: Every day | ORAL | Status: DC
  Administered 2019-03-25: 20 mg via ORAL
  Filled 2019-03-25: qty 1

## 2019-03-25 MED ORDER — STERILE WATER FOR IRRIGATION IR SOLN
Status: DC | PRN
  Administered 2019-03-25: 1000 mL

## 2019-03-25 MED ORDER — HYDROMORPHONE HCL 2 MG/ML IJ SOLN
INTRAMUSCULAR | Status: AC
  Filled 2019-03-25: qty 1

## 2019-03-25 MED ORDER — FENTANYL CITRATE (PF) 100 MCG/2ML IJ SOLN
25.0000 ug | INTRAMUSCULAR | Status: DC | PRN
  Administered 2019-03-25: 25 ug via INTRAVENOUS

## 2019-03-25 MED ORDER — HYDROMORPHONE HCL 1 MG/ML IJ SOLN
INTRAMUSCULAR | Status: DC | PRN
  Administered 2019-03-25: 0.5 mg via INTRAVENOUS

## 2019-03-25 MED ORDER — ACETAMINOPHEN 10 MG/ML IV SOLN
INTRAVENOUS | Status: DC | PRN
  Administered 2019-03-25: 1000 mg via INTRAVENOUS

## 2019-03-25 MED ORDER — BUPIVACAINE-EPINEPHRINE (PF) 0.25% -1:200000 IJ SOLN
INTRAMUSCULAR | Status: AC
  Filled 2019-03-25: qty 30

## 2019-03-25 MED ORDER — OXYCODONE HCL 5 MG/5ML PO SOLN: 5.0000 mg | Freq: Once | ORAL | Status: DC | PRN

## 2019-03-25 MED ORDER — LIDOCAINE 2% (20 MG/ML) 5 ML SYRINGE
INTRAMUSCULAR | Status: AC
  Filled 2019-03-25: qty 5

## 2019-03-25 MED ORDER — FENTANYL CITRATE (PF) 250 MCG/5ML IJ SOLN
INTRAMUSCULAR | Status: AC
  Filled 2019-03-25: qty 5

## 2019-03-25 MED ORDER — OXYCODONE HCL 5 MG PO TABS: 5.0000 mg | ORAL_TABLET | Freq: Once | ORAL | Status: DC | PRN

## 2019-03-25 MED ORDER — CEFAZOLIN SODIUM-DEXTROSE 2-4 GM/100ML-% IV SOLN
2.0000 g | Freq: Three times a day (TID) | INTRAVENOUS | Status: AC
  Administered 2019-03-25 – 2019-03-26 (×2): 2 g via INTRAVENOUS
  Filled 2019-03-25 (×2): qty 100

## 2019-03-25 MED ORDER — OXYBUTYNIN CHLORIDE 5 MG PO TABS: 5.0000 mg | ORAL_TABLET | Freq: Three times a day (TID) | ORAL | Status: DC | PRN

## 2019-03-25 SURGICAL SUPPLY — 71 items
ADH SKN CLS APL DERMABOND .7 (GAUZE/BANDAGES/DRESSINGS) ×1
AGENT HMST KT MTR STRL THRMB (HEMOSTASIS)
APL PRP STRL LF DISP 70% ISPRP (MISCELLANEOUS) ×1
BAG LAPAROSCOPIC 12 15 PORT 16 (BASKET) ×1 IMPLANT
BAG RETRIEVAL 12/15 (BASKET) ×2
BAG RETRIEVAL 12/15MM (BASKET) ×1
BAG SPEC RTRVL LRG 6X4 10 (ENDOMECHANICALS)
BAG SPEC THK2 15X12 ZIP CLS (MISCELLANEOUS)
BAG ZIPLOCK 12X15 (MISCELLANEOUS) ×1 IMPLANT
BLADE EXTENDED COATED 6.5IN (ELECTRODE) IMPLANT
BLADE SURG SZ10 CARB STEEL (BLADE) IMPLANT
CABLE HIGH FREQUENCY MONO STRZ (ELECTRODE) ×3 IMPLANT
CHLORAPREP W/TINT 26 (MISCELLANEOUS) ×3 IMPLANT
CLIP VESOLOCK LG 6/CT PURPLE (CLIP) ×3 IMPLANT
CLIP VESOLOCK MED LG 6/CT (CLIP) ×3 IMPLANT
CLIP VESOLOCK XL 6/CT (CLIP) IMPLANT
COVER SURGICAL LIGHT HANDLE (MISCELLANEOUS) ×3 IMPLANT
COVER WAND RF STERILE (DRAPES) IMPLANT
CUTTER ECHEON FLEX ENDO 45 340 (ENDOMECHANICALS) ×3 IMPLANT
DERMABOND ADVANCED (GAUZE/BANDAGES/DRESSINGS) ×2
DERMABOND ADVANCED .7 DNX12 (GAUZE/BANDAGES/DRESSINGS) ×1 IMPLANT
DRAIN CHANNEL 10F 3/8 F FF (DRAIN) IMPLANT
DRAPE INCISE IOBAN 66X45 STRL (DRAPES) ×3 IMPLANT
DRSG TEGADERM 4X4.75 (GAUZE/BANDAGES/DRESSINGS) IMPLANT
ELECT PENCIL ROCKER SW 15FT (MISCELLANEOUS) ×3 IMPLANT
ELECT REM PT RETURN 15FT ADLT (MISCELLANEOUS) ×3 IMPLANT
EVACUATOR SILICONE 100CC (DRAIN) IMPLANT
GLOVE BIO SURGEON STRL SZ 6.5 (GLOVE) ×2 IMPLANT
GLOVE BIO SURGEONS STRL SZ 6.5 (GLOVE) ×1
GLOVE BIOGEL M STRL SZ7.5 (GLOVE) ×3 IMPLANT
GOWN STRL REUS W/TWL LRG LVL3 (GOWN DISPOSABLE) ×6 IMPLANT
HEMOSTAT SURGICEL 4X8 (HEMOSTASIS) IMPLANT
IRRIG SUCT STRYKERFLOW 2 WTIP (MISCELLANEOUS) ×3
IRRIGATION SUCT STRKRFLW 2 WTP (MISCELLANEOUS) ×1 IMPLANT
KIT BASIN OR (CUSTOM PROCEDURE TRAY) ×3 IMPLANT
KIT TURNOVER KIT A (KITS) IMPLANT
LIGASURE VESSEL 5MM BLUNT TIP (ELECTROSURGICAL) ×3 IMPLANT
NDL INSUFFLATION 14GA 120MM (NEEDLE) ×1 IMPLANT
NEEDLE INSUFFLATION 14GA 120MM (NEEDLE) ×3 IMPLANT
POUCH SPECIMEN RETRIEVAL 10MM (ENDOMECHANICALS) ×1 IMPLANT
RELOAD STAPLE 45 2.6 WHT THIN (STAPLE) IMPLANT
RELOAD STAPLE 45 3.6 BLU REG (STAPLE) IMPLANT
RELOAD STAPLE 45 4.1 GRN THCK (STAPLE) IMPLANT
RETRACTOR LAPSCP 12X46 CVD (ENDOMECHANICALS) IMPLANT
RTRCTR LAPSCP 12X46 CVD (ENDOMECHANICALS)
SCISSORS LAP 5X35 DISP (ENDOMECHANICALS) ×5 IMPLANT
SET TUBE SMOKE EVAC HIGH FLOW (TUBING) ×3 IMPLANT
STAPLE RELOAD 45 GRN (STAPLE) IMPLANT
STAPLE RELOAD 45 WHT (STAPLE) ×2 IMPLANT
STAPLE RELOAD 45MM BLUE (STAPLE) IMPLANT
STAPLE RELOAD 45MM GREEN (STAPLE)
STAPLE RELOAD 45MM WHITE (STAPLE) ×6
SURGIFLO W/THROMBIN 8M KIT (HEMOSTASIS) IMPLANT
SUT ETHILON 3 0 PS 1 (SUTURE) IMPLANT
SUT MNCRL AB 4-0 PS2 18 (SUTURE) ×6 IMPLANT
SUT PDS AB 0 CT1 36 (SUTURE) ×6 IMPLANT
SUT VIC AB 2-0 SH 27 (SUTURE)
SUT VIC AB 2-0 SH 27X BRD (SUTURE) IMPLANT
SUT VIC AB 3-0 SH 27 (SUTURE) ×3
SUT VIC AB 3-0 SH 27XBRD (SUTURE) IMPLANT
SUT VICRYL 0 UR6 27IN ABS (SUTURE) ×3 IMPLANT
SYS LAPSCP GELPORT 120MM (MISCELLANEOUS)
SYSTEM LAPSCP GELPORT 120MM (MISCELLANEOUS) IMPLANT
TOWEL OR 17X26 10 PK STRL BLUE (TOWEL DISPOSABLE) ×3 IMPLANT
TRAY FOLEY MTR SLVR 14FR STAT (SET/KITS/TRAYS/PACK) IMPLANT
TRAY FOLEY MTR SLVR 16FR STAT (SET/KITS/TRAYS/PACK) IMPLANT
TRAY LAPAROSCOPIC (CUSTOM PROCEDURE TRAY) ×3 IMPLANT
TROCAR BLADELESS OPT 5 100 (ENDOMECHANICALS) ×3 IMPLANT
TROCAR UNIVERSAL OPT 12M 100M (ENDOMECHANICALS) ×6 IMPLANT
TROCAR XCEL 12X100 BLDLESS (ENDOMECHANICALS) ×3 IMPLANT
YANKAUER SUCT BULB TIP 10FT TU (MISCELLANEOUS) ×3 IMPLANT

## 2019-03-25 NOTE — Anesthesia Preprocedure Evaluation (Addendum)
Anesthesia Evaluation  Patient identified by MRN, date of birth, ID band Patient awake    Reviewed: Allergy & Precautions, NPO status , Patient's Chart, lab work & pertinent test results  History of Anesthesia Complications Negative for: history of anesthetic complications  Airway Mallampati: III  TM Distance: >3 FB Neck ROM: Full    Dental  (+) Dental Advisory Given, Partial Lower, Partial Upper   Pulmonary neg recent URI, Current Smoker,    breath sounds clear to auscultation       Cardiovascular negative cardio ROS   Rhythm:Regular     Neuro/Psych negative neurological ROS  negative psych ROS   GI/Hepatic negative GI ROS, Neg liver ROS,   Endo/Other  Hypothyroidism Hyperthyroidism Graves s/p tonsillectomy  Renal/GU Renal diseaseLEFT RENAL MASS  negative genitourinary   Musculoskeletal negative musculoskeletal ROS (+)   Abdominal   Peds  Hematology negative hematology ROS (+)   Anesthesia Other Findings   Reproductive/Obstetrics                            Anesthesia Physical Anesthesia Plan  ASA: II  Anesthesia Plan: General   Post-op Pain Management:    Induction: Intravenous  PONV Risk Score and Plan: 2 and Ondansetron and Dexamethasone  Airway Management Planned: Oral ETT  Additional Equipment: None  Intra-op Plan:   Post-operative Plan: Extubation in OR  Informed Consent: I have reviewed the patients History and Physical, chart, labs and discussed the procedure including the risks, benefits and alternatives for the proposed anesthesia with the patient or authorized representative who has indicated his/her understanding and acceptance.     Dental advisory given  Plan Discussed with: Surgeon and CRNA  Anesthesia Plan Comments: (PIV x 2)        Anesthesia Quick Evaluation

## 2019-03-25 NOTE — Transfer of Care (Signed)
Immediate Anesthesia Transfer of Care Note  Patient: [name]  Procedure(s) Performed: Procedure(s): LAPAROSCOPIC RADICAL NEPHRECTOMY (Left)  Patient Location: PACU  Anesthesia Type:General  Level of Consciousness:  sedated, patient cooperative and responds to stimulation  Airway & Oxygen Therapy:Patient Spontanous Breathing and Patient connected to face mask oxgen  Post-op Assessment:  Report given to PACU RN and Post -op Vital signs reviewed and stable  Post vital signs:  Reviewed and stable  Last Vitals:  Vitals:   03/25/19 0732  BP: 131/74  Pulse: 73  Resp: 16  Temp: 36.8 C  SpO2: 42%    Complications: No apparent anesthesia complications

## 2019-03-25 NOTE — Anesthesia Procedure Notes (Signed)
Procedure Name: Intubation Date/Time: 03/25/2019 9:32 AM Performed by: Anne Fu, CRNA Pre-anesthesia Checklist: Patient identified, Emergency Drugs available, Suction available, Patient being monitored and Timeout performed Patient Re-evaluated:Patient Re-evaluated prior to induction Oxygen Delivery Method: Circle system utilized Preoxygenation: Pre-oxygenation with 100% oxygen Induction Type: IV induction Ventilation: Mask ventilation without difficulty Laryngoscope Size: Mac and 4 Grade View: Grade II Tube type: Oral Tube size: 7.5 mm Number of attempts: 1 Airway Equipment and Method: Stylet Placement Confirmation: ETT inserted through vocal cords under direct vision,  positive ETCO2 and breath sounds checked- equal and bilateral Secured at: 19 cm Tube secured with: Tape Dental Injury: Teeth and Oropharynx as per pre-operative assessment

## 2019-03-25 NOTE — H&P (Signed)
Urology Preoperative H&P   Chief Complaint: Left renal mass  History of Present Illness: [name] is a 78 y.o. female with referred by Dr. Debe Coder after the patient was found to have an enlarging 6 cm left renal mass on recent cross sectional imaging. Apparently, the mass was seen on Abdominal US from November of 2019 and seen again on CT from January of 2020. Follow-up CT from 02/12/19 showed persistence of the left renal mass with likely renal sinus fat involvement, but no signs of renal vein involvement.   CT CHEST/ABD/PEL (02/12/19)  IMPRESSION:  1. 6.4 cm heterogeneously enhancing mass in the lower pole of the left kidney is consistent with renal cell carcinoma. The mass extends into the central sinus. No filling defect in the left renal vein to suggest tumor involvement. No lymphadenopathy in the abdomen/pelvis. No definite findings for metastatic disease  2. 6 mm irregular left upper lobe pulmonary nodule that is new since 05/02/18  3. 6 mm low-density subcapsular lesion in the inferior right liver that is stable since 09/2018   Labs 01/29/19  Serum creatinine- 0.61  eGFR- >60   PMHX of Stage II adenocarcinoma of the left breast cancer (T2, N0, M0) s/p left mastectomy in 1990. NED on follow-up mammograms. Long time smoker (1ppd since age 7).   Past Medical History:  Diagnosis Date  . Adenocarcinoma of left breast 04/16/2012   Stage II (T2 N0 M0) left-sided adenocarcinoma breast diagnosed in 1990 by Dr. Romona Curls and treated by Dr. Juanita Craver on NSAB PPD-19 with CMF followed by 5 years of tamoxifen and she remains disease free thus far.   . Breast cancer ([place])    left breast/mastectomy/dx approx1990  . Gall bladder stones   . Grave's disease   . High triglycerides   . History of kidney stones    small stone  rt   . Nerve compression    pinched in neck  . Pneumonia 1990    Past Surgical History:  Procedure Laterality Date  . CARPAL TUNNEL RELEASE     rt. hand  . cataract  surgery     bilateral  . EYE SURGERY    . MASTECTOMY     left.  . TONSILLECTOMY      Allergies: No Known Allergies  Family History  Problem Relation Age of Onset  . Cancer Father        lung cancer  . Cancer Brother        colon cancer  . Cancer Other        lung cancer/died age 32    Social History:  reports that she has been smoking cigarettes. She has a 60.00 pack-year smoking history. She has never used smokeless tobacco. She reports that she does not drink alcohol or use drugs.  ROS: A complete review of systems was performed.  All systems are negative except for pertinent findings as noted.  Physical Exam:  Vital signs in last 24 hours:   Constitutional:  Alert and oriented, No acute distress Cardiovascular: Regular rate and rhythm, No JVD Respiratory: Normal respiratory effort, Lungs clear bilaterally GI: Abdomen is soft, nontender, nondistended, no abdominal masses GU: No CVA tenderness Lymphatic: No lymphadenopathy Neurologic: Grossly intact, no focal deficits Psychiatric: Normal mood and affect  Laboratory Data:  Recent Labs    03/23/19 1100  WBC 6.8  HGB 13.5  HCT 43.5  PLT 287    Recent Labs    03/23/19 1100  NA 143  K 4.7  CL 109  GLUCOSE 94  BUN 14  CALCIUM 9.5  CREATININE 0.61     No results found for this or any previous visit (from the past 24 hour(s)). Recent Results (from the past 240 hour(s))  SARS Coronavirus 2 (Performed in [facility] hospital lab)     Status: None   Collection Time: 03/21/19 11:36 AM   Specimen: Nasal Swab  Result Value Ref Range Status   SARS Coronavirus 2 NEGATIVE NEGATIVE Final    Comment: (NOTE) SARS-CoV-2 target nucleic acids are NOT DETECTED. The SARS-CoV-2 RNA is generally detectable in upper and lower respiratory specimens during the acute phase of infection. Negative results do not preclude SARS-CoV-2 infection, do not rule out co-infections with other pathogens, and should not be used as  the sole basis for treatment or other patient management decisions. Negative results must be combined with clinical observations, patient history, and epidemiological information. The expected result is Negative. Fact Sheet for Patients: SugarRoll.be Fact Sheet for Healthcare Providers: https://www.woods-mathews.com/ This test is not yet approved or cleared by the [place] FDA and  has been authorized for detection and/or diagnosis of SARS-CoV-2 by FDA under an Emergency Use Authorization (EUA). This EUA will remain  in effect (meaning this test can be used) for the duration of the COVID-19 declaration under Section 56 4(b)(1) of the Act, 21 U.S.C. section 360bbb-3(b)(1), unless the authorization is terminated or revoked sooner. Performed at [facility], [place] [address], [place], [place] [address]     Renal Function: Recent Labs    03/23/19 1100  CREATININE 0.61   Estimated Creatinine Clearance: 53.8 mL/min (by C-G formula based on SCr of 0.61 mg/dL).  Radiologic Imaging: No results found.  I independently reviewed the above imaging studies.  Assessment and Plan [name] is a 78 y.o. female with a 6.4 cm solid and enhancing left renal mass with features concerning for RCC  -I reviewed imaging results and films with the patient personally. We discussed that the mass in question has features concerning for malignancy. I explained the natural history of presumed renal cell carcinoma. I reviewed the AUA guidelines for evaluation and treatment of renal masses. The options of active surveillance, in situ tumor ablation, partial and radical nephrectomy was discussed. The risks of laparoscopic LEFT radical nephrectomy were discussed in detail including but not limited to: negative pathology, open conversion, infection of the skin/abdominal cavity, VTE, MI/CVA, lymphatic leak, injury to adjacent solid/hollow viscus organs, bleeding  requiring a blood transfusion, catastrophic bleeding, hernia formation and other imponderables. The patient voices understanding and wishes to proceed.   Ellison Hughs, MD 03/25/2019, 6:25 AM  [facility] Pager: [phone]

## 2019-03-25 NOTE — Op Note (Signed)
Operative Note  Preoperative diagnosis:  1.  6.4 cm left renal mass  Postoperative diagnosis: 1.  Same  Procedure(s): 1.  Laparoscopic left nephrectomy (adrenal sparing)  Surgeon: Ellison Hughs, MD  Assistants:  Tharon Aquas, MD PGY-4  Anesthesia:  General  Complications:  None  EBL:  50 mL   Specimens: 1. Left kidney   Drains/Catheters: 1.  Foley catheter  Intraoperative findings:   1. There was a prominent mass arising from the anterior surface of the left kidney 2. The left renal hilum was hemostatic following staple ligation  Indication:  [name] is a 78 y.o. female with a 6.4 cm solid and enhancing left renal mass with features concerning for renal cell carcinoma.  The patient has been consented for the above procedures, voices understanding and wishes to proceed.  Description of procedure:  After informed consent was obtained, the patient was brought to the operating room and general anesthesia was diminished. The patient was then placed in the right lateral decubitus position and prepped and draped in usual sterile fashion. A timeout was then performed. A 12 mm left lower quadrant incision was made. A Veress needle was then used to gain access to the abdominal cavity. A saline drop test within the Veress needle showed no signs of obstruction and no blood or sucus was seen upon aspiration of the needle. The abdomen was then insufflated up to 15 mmHg and a 12 mm laparoscopic port was placed within the abdominal cavity. A laparoscopic camera was then used to inspect the site of access, revealing no bowel or vessel injury. Additional 5 and 12 mm ports were placed under direct vision, achieving triangulation of the left renal hilum.    The white line of T[place]t of the descending colon was then incised using a combination of sharp and blunt dissection allowing the colon to be reflected medially, exposing the left retroperitoneal space. The splenic attachments were  then incised using elctrocautery, giving adequate visualization and mobiliztion of the upper pole of the left kidney. I then created a retroperitoneal window caudad to the lower pole of the left kidney, exposing the anteromedial surface of the psoas muscle. The left ureter and gonadal vein were identified and reflected superiorly off of the psoas muscle.  We then dissected further cephalad using blunt dissection as well as hook electrocautery. The left renal vein and artery were then identified and completely isolated. The left renal artery was then ligated with the endovascular stapler, with good hemostasis. The left renal vein showed complete decompression and was ligated in a similar fashion. The left adrenal gland was then identified and preserved by dissecting it off the upper pole the left kidney, leaving its blood supply intact.   The left ureter and lower pole attachments of the left kidney were then ligated using the endovascular stapler.  The upper and lateral pole attachments of the left kidney were then incised using electrocautery, completely freeing it. The left kidney was then placed into an Endo Catch bag. Reinspection of the left renal hilum and left retroperitoneum revealed complete hemostasis.    A left lower quadrant Gibson incision was then made and the left kidney, still within the Endo Catch bag, was then removed from the abdominal cavity.  The Gibson incision was then closed using running 0 Vicryl to re-approximate the internal oblique fascia and 0 PDS suture to re-approximate the external oblique layers. The abdomen with the inflated and a second inspection of the left retroperitoneum revealed complete hemostasis.  The abdomen was then desufflated and all laparoscopic ports were removed.  A skin stapler was then used to re-approximate all skin incisions, which were then dressed appropriately. The patient was then replaced in the supine position and awoken from anesthesia. The patient  tolerated the procedure well and was transferred to the postanesthesia unit in stable condition.  Plan:  Monitor on the floor overnight.

## 2019-03-26 ENCOUNTER — Encounter (HOSPITAL_COMMUNITY): Payer: Self-pay | Admitting: Urology

## 2019-03-26 LAB — HEMOGLOBIN AND HEMATOCRIT, BLOOD
HCT: 37.3 % (ref 36.0–46.0)
Hemoglobin: 11.5 g/dL — ABNORMAL LOW (ref 12.0–15.0)

## 2019-03-26 LAB — BASIC METABOLIC PANEL
Anion gap: 9 (ref 5–15)
BUN: 10 mg/dL (ref 8–23)
CO2: 25 mmol/L (ref 22–32)
Calcium: 8.7 mg/dL — ABNORMAL LOW (ref 8.9–10.3)
Chloride: 103 mmol/L (ref 98–111)
Creatinine, Ser: 1.04 mg/dL — ABNORMAL HIGH (ref 0.44–1.00)
GFR calc Af Amer: >60 mL/min (ref >60)
GFR calc non Af Amer: 52 mL/min — ABNORMAL LOW (ref >60)
Glucose, Bld: 101 mg/dL — ABNORMAL HIGH (ref 70–99)
Potassium: 3.8 mmol/L (ref 3.5–5.1)
Sodium: 137 mmol/L (ref 135–145)

## 2019-03-26 MED ORDER — HYDROCODONE-ACETAMINOPHEN 5-325 MG PO TABS: 1.0000 | ORAL_TABLET | ORAL | 0 refills | Status: DC | PRN

## 2019-03-26 MED ORDER — ASPIRIN 325 MG PO TABS: 325.0000 mg | ORAL_TABLET | Freq: Four times a day (QID) | ORAL | Status: DC | PRN

## 2019-03-26 NOTE — Discharge Summary (Signed)
[facility] Discharge Summary  Admit date: 03/25/2019  Discharge date and time: 03/26/19   Discharge to: Home  Discharge Service: Urology  Discharge Attending Physician:  Dr. Lovena Neighbours  Discharge  Diagnoses: Renal Mass  OR Procedures: Procedure(s): LAPAROSCOPIC RADICAL NEPHRECTOMY 03/25/2019   Ancillary Procedures: None   Discharge Day Services: The patient was seen and examined by the Urology team both in the morning and immediately prior to discharge.  Vital signs and laboratory values were stable and within normal limits.  The physical exam was benign and unchanged and all surgical wounds were examined.  Discharge instructions were explained and all questions answered.  Subjective  No acute events overnight. Pain Controlled. No fever or chills.  Objective Patient Vitals for the past 8 hrs:  BP Temp Temp src Pulse Resp SpO2  03/26/19 1323 (!) 122/52 98.4 F (36.9 C) Oral 78 14 90 %  03/26/19 1048 128/62 98.1 F (36.7 C) Oral 71 18 (!) 89 %  03/26/19 0658 - - - - - 91 %   Total I/O In: 240 [P.O.:240] Out: 2400 [Urine:2400]  General Appearance:        No acute distress Lungs:                       Normal work of breathing on room air Heart:                                Regular rate and rhythm Abdomen:                         Soft, non-tender, non-distended. Ecchymosis around left flank but non tender. Incisions healing well with dermabond Extremities:                      Warm and well perfused   Hospital Course:  The patient underwent left laparoscopic hand-assisted nephrectomy and on 03/25/2019.  The patient tolerated the procedure well, was extubated in the OR, and afterwards was taken to the PACU for routine post-surgical care. When stable the patient was transferred to the floor.     The patient did well postoperatively.  She had an episode of emesis overnight as well as another small episode emesis with breakfast.  However she contributes this to "drinking  water" which she normally does not do as it makes her nauseous.  She is able to tolerate a full lunch without difficulty and felt ready for discharge by the afternoon of postop day 1.  The patient was discharged home 1 Day Post-Op, at which point was tolerating a regular solid diet, was able to void spontaneously, have adequate pain control with P.O. pain medication, and could ambulate without difficulty. The patient will follow up with [place] for post op check.   Condition at Discharge: Improved  Discharge Medications:  Allergies as of 03/26/2019   No Known Allergies     Medication List    TAKE these medications   aspirin 325 MG tablet Take 1 tablet (325 mg total) by mouth every 6 (six) hours as needed for moderate pain. Resume in 1 week What changed: additional instructions   Centrum Silver Ultra Womens Tabs Take 1 tablet by mouth daily.   HYDROcodone-acetaminophen 5-325 MG tablet Commonly known as: Norco Take 1 tablet by mouth every 4 (four) hours as needed for moderate pain.   levothyroxine 100 MCG tablet Commonly  known as: SYNTHROID Take 100 mcg by mouth daily before breakfast.   meclizine 50 MG tablet Commonly known as: ANTIVERT Take 25 mg by mouth 3 (three) times daily as needed for dizziness.   simvastatin 20 MG tablet Commonly known as: ZOCOR Take 20 mg by mouth at bedtime.

## 2019-03-26 NOTE — Progress Notes (Signed)
Pt d/c instructions given. Verbalized understanding of instructions given. No changes in intital am assessment. IV d/c. Pt stable at time of d/c

## 2019-03-26 NOTE — Progress Notes (Signed)
Urology Progress Note   1 Day Post-Op status post laparoscopic radical nephrectomy  Subjective: One episode of emesis overnight but nausea has resolved.  Pain controlled, not using narcotic pain medication.  Ambulate in the hallway.  Labs stable  Objective: Vital signs in last 24 hours: Temp:  [97.6 F (36.4 C)-99.1 F (37.3 C)] 99.1 F (37.3 C) (07/23 0500) Pulse Rate:  [66-77] 72 (07/23 0500) Resp:  [12-22] 16 (07/23 0500) BP: (119-160)/(57-74) 119/63 (07/23 0500) SpO2:  [90 %-98 %] 91 % (07/23 0658)  Intake/Output from previous day: 07/22 0701 - 07/23 0700 In: 4939.4 [P.O.:1080; I.V.:3459.3; IV Piggyback:400.1] Out: 1402 [Urine:1376; Emesis/NG output:1; Blood:25] Intake/Output this shift: No intake/output data recorded.  Physical Exam:  General: Alert and oriented CV: RRR Lungs: Normal work of breathing Abdomen: Soft, appropriately tender.  Bruising around left lower quadrant incision but nontender GU: Foley in place draining clear yellow urine  Ext: NT, No erythema  Lab Results: Recent Labs    03/23/19 1100 03/25/19 1230 03/26/19 0559  HGB 13.5 11.9* 11.5*  HCT 43.5 39.8 37.3   BMET Recent Labs    03/23/19 1100 03/26/19 0559  NA 143 137  K 4.7 3.8  CL 109 103  CO2 25 25  GLUCOSE 94 101*  BUN 14 10  CREATININE 0.61 1.04*  CALCIUM 9.5 8.7*     Studies/Results: No results found.  Assessment/Plan:  78 y.o. female s/p lap left radical nephrectomy.  Overall doing well post-op.   -Discontinue fluids -Discontinue catheter -Soft diet -Ambulate -Likely home this afternoon if patient feels well and has her pain controlled   Dispo: Floor   LOS: 1 day   Tharon Aquas 03/26/2019, 7:28 AM

## 2019-03-26 NOTE — Progress Notes (Signed)
Pt consumed 50% of their lunch tray and tolerated it well with no nausea or vomiting.

## 2019-03-26 NOTE — Discharge Instructions (Signed)

## 2019-03-30 NOTE — Anesthesia Postprocedure Evaluation (Signed)
Anesthesia Post Note  Patient: [name]  Procedure(s) Performed: LAPAROSCOPIC RADICAL NEPHRECTOMY (Left Abdomen)     Patient location during evaluation: PACU Anesthesia Type: General Level of consciousness: awake and alert Pain management: pain level controlled Vital Signs Assessment: post-procedure vital signs reviewed and stable Respiratory status: spontaneous breathing, nonlabored ventilation, respiratory function stable and patient connected to nasal cannula oxygen Cardiovascular status: blood pressure returned to baseline and stable Postop Assessment: no apparent nausea or vomiting Anesthetic complications: no    Last Vitals:  Vitals:   03/26/19 1048 03/26/19 1323  BP: 128/62 (!) 122/52  Pulse: 71 78  Resp: 18 14  Temp: 36.7 C 36.9 C  SpO2: (!) 89% 90%    Last Pain:  Vitals:   03/26/19 1323  TempSrc: Oral  PainSc:                  Kaneesha Constantino

## 2019-04-13 ENCOUNTER — Inpatient Hospital Stay (HOSPITAL_COMMUNITY): Payer: Medicare PPO | Attending: Hematology | Admitting: Hematology

## 2019-04-13 ENCOUNTER — Encounter (HOSPITAL_COMMUNITY): Payer: Self-pay | Admitting: Hematology

## 2019-04-13 ENCOUNTER — Other Ambulatory Visit: Payer: Self-pay

## 2019-04-13 VITALS — BP 137/66 | HR 77 | Temp 97.5°F | Resp 16 | Wt 128.1 lb

## 2019-04-13 DIAGNOSIS — F17210 Nicotine dependence, cigarettes, uncomplicated: Secondary | ICD-10-CM | POA: Diagnosis not present

## 2019-04-13 DIAGNOSIS — Z7982 Long term (current) use of aspirin: Secondary | ICD-10-CM | POA: Insufficient documentation

## 2019-04-13 DIAGNOSIS — Z85528 Personal history of other malignant neoplasm of kidney: Secondary | ICD-10-CM | POA: Insufficient documentation

## 2019-04-13 DIAGNOSIS — Z801 Family history of malignant neoplasm of trachea, bronchus and lung: Secondary | ICD-10-CM | POA: Insufficient documentation

## 2019-04-13 DIAGNOSIS — Z853 Personal history of malignant neoplasm of breast: Secondary | ICD-10-CM | POA: Insufficient documentation

## 2019-04-13 DIAGNOSIS — N2889 Other specified disorders of kidney and ureter: Secondary | ICD-10-CM

## 2019-04-13 DIAGNOSIS — Z170 Estrogen receptor positive status [ER+]: Secondary | ICD-10-CM | POA: Insufficient documentation

## 2019-04-13 DIAGNOSIS — Z905 Acquired absence of kidney: Secondary | ICD-10-CM | POA: Insufficient documentation

## 2019-04-13 DIAGNOSIS — Z9012 Acquired absence of left breast and nipple: Secondary | ICD-10-CM | POA: Insufficient documentation

## 2019-04-13 DIAGNOSIS — Z800 Family history of malignant neoplasm of digestive organs: Secondary | ICD-10-CM | POA: Diagnosis not present

## 2019-04-13 DIAGNOSIS — Z79899 Other long term (current) drug therapy: Secondary | ICD-10-CM | POA: Insufficient documentation

## 2019-04-13 DIAGNOSIS — C642 Malignant neoplasm of left kidney, except renal pelvis: Secondary | ICD-10-CM | POA: Diagnosis not present

## 2019-04-13 NOTE — Assessment & Plan Note (Signed)
1.  Stage III (pT3a, pNx) clear-cell renal cell carcinoma: - Adrenal sparing laparoscopic left nephrectomy on 03/25/2019 by Dr.Winter -CT scan and bone scan on 02/12/2019 was negative for metastatic disease. - We reviewed the pathology report dated 03/25/2019 which showed grade 2 clear cell renal cell carcinoma 5.8 cm, tumor invading anterior perinephric fat and segmental removing.  Negative sarcomatoid, rhabdoid features and tumor necrosis.  Margins negative. - We have discussed about surveillance versus adjuvant Sutent.  I have recommended against it. - We will do surveillance every 3 to 6 months for 3 years followed by annually up to 5 years. -We will obtain a baseline imaging and 3 months and then do CT CAP scan every 3 to 6 months for the first 3 years and then annually up to 5 years.  We will do a bone scan if symptoms warrant. -I will see her back in 3 months for follow-up with labs and scan.  2.  Stage II (T2 N0 M0) left breast cancer: -Status post left mastectomy, diagnosed in 1990, treated with CMF followed by 5 years of tamoxifen. - Right breast mammogram on 02/05/2019 was BI-RADS Category 1.

## 2019-04-13 NOTE — Progress Notes (Signed)
[name], [name]   CLINIC:  Medical Oncology/Hematology  PCP:  [name], [name] [place] [address] [phone]   REASON FOR VISIT:  Follow-up for Adenocarcinoma of left breast and left kidney Tyler     Tyler STAGING: Tyler Staging Adenocarcinoma of left breast Staging form: Breast, AJCC 7th Edition - Clinical: Stage IIA (T2, N0, cM0) - Signed by Shelby Cancer, PA on 04/16/2012  Clear cell renal cell carcinoma, left (HCC) Staging form: Kidney, AJCC 8th Edition - Clinical: Stage III (cT3a, cNX, cM0) - Signed by Shelby Jack, MD on 04/13/2019    INTERVAL HISTORY:  [name] 78 y.o. female is seen for follow-up of left renal Tyler.  She had laparoscopic left nephrectomy done on 03/25/2019.  She is recovering well from surgery.  Appetite and energy levels are present.  Denies any hematuria.  No pain reported.  Denies any fevers, night sweats or weight loss.  No nausea, vomiting, diarrhea or constipation reported.  Denies any ER visits or recent hospitalizations.  REVIEW OF SYSTEMS:  Review of Systems  Neurological: Negative for dizziness.  All other systems reviewed and are negative.    PAST MEDICAL/SURGICAL HISTORY:  Past Medical History:  Diagnosis Date  . Adenocarcinoma of left breast 04/16/2012   Stage II (T2 N0 M0) left-sided adenocarcinoma breast diagnosed in 1990 by Dr. Romona Tyler and treated by Dr. Juanita Tyler on NSAB PPD-19 with CMF followed by 5 years of tamoxifen and she remains disease free thus far.   . Breast Tyler ([place])    left breast/mastectomy/dx approx1990  . Gall bladder stones   . Grave's disease   . High triglycerides   . History of kidney stones    small stone  rt   . Nerve compression    pinched in neck  . Pneumonia 1990   Past Surgical History:  Procedure Laterality Date  . CARPAL TUNNEL RELEASE     rt. hand  . cataract surgery     bilateral   . EYE SURGERY    . LAPAROSCOPIC NEPHRECTOMY Left 03/25/2019   Procedure: LAPAROSCOPIC RADICAL NEPHRECTOMY;  Surgeon: Shelby Mons, MD;  Location: [facility];  Service: Urology;  Laterality: Left;  Marland Kitchen MASTECTOMY     left.  . TONSILLECTOMY       SOCIAL HISTORY:  Social History   Socioeconomic History  . Marital status: Divorced    Spouse name: Not on file  . Number of children: Not on file  . Years of education: Not on file  . Highest education level: Not on file  Occupational History  . Not on file  Social Needs  . Financial resource strain: Not on file  . Food insecurity    Worry: Not on file    Inability: Not on file  . Transportation needs    Medical: Not on file    Non-medical: Not on file  Tobacco Use  . Smoking status: Current Every Day Smoker    Packs/day: 1.00    Years: 60.00    Pack years: 60.00    Types: Cigarettes  . Smokeless tobacco: Never Used  Substance and Sexual Activity  . Alcohol use: No  . Drug use: No  . Sexual activity: Not on file  Lifestyle  . Physical activity    Days per week: Not on file    Minutes per session: Not on file  . Stress: Not on file  Relationships  .  Social Herbalist on phone: Not on file    Gets together: Not on file    Attends religious service: Not on file    Active member of club or organization: Not on file    Attends meetings of clubs or organizations: Not on file    Relationship status: Not on file  . Intimate partner violence    Fear of current or ex partner: Not on file    Emotionally abused: Not on file    Physically abused: Not on file    Forced sexual activity: Not on file  Other Topics Concern  . Not on file  Social History Narrative  . Not on file    FAMILY HISTORY:  Family History  Problem Relation Age of Onset  . Tyler Father        lung Tyler  . Tyler Brother        colon Tyler  . Tyler Other        lung Tyler/died age 23    CURRENT MEDICATIONS:  Outpatient  Encounter Medications as of 04/13/2019  Medication Sig  . aspirin 325 MG tablet Take 1 tablet (325 mg total) by mouth every 6 (six) hours as needed for moderate pain. Resume in 1 week  . levothyroxine (SYNTHROID, LEVOTHROID) 100 MCG tablet Take 100 mcg by mouth daily before breakfast.   . meclizine (ANTIVERT) 50 MG tablet Take 25 mg by mouth 3 (three) times daily as needed for dizziness.   . Multiple Vitamins-Minerals (CENTRUM SILVER ULTRA WOMENS) TABS Take 1 tablet by mouth daily.   . simvastatin (ZOCOR) 20 MG tablet Take 20 mg by mouth at bedtime.    . [DISCONTINUED] HYDROcodone-acetaminophen (NORCO) 5-325 MG tablet Take 1 tablet by mouth every 4 (four) hours as needed for moderate pain.   No facility-administered encounter medications on file as of 04/13/2019.     ALLERGIES:  No Known Allergies   PHYSICAL EXAM:  ECOG Performance status: 1  Vitals:   04/13/19 1500  BP: 137/66  Pulse: 77  Resp: 16  Temp: (!) 97.5 F (36.4 C)  SpO2: 94%   Filed Weights   04/13/19 1500  Weight: 128 lb 1 oz (58.1 kg)    Physical Exam Vitals signs reviewed.  Constitutional:      Appearance: Normal appearance.  Cardiovascular:     Rate and Rhythm: Normal rate and regular rhythm.     Heart sounds: Normal heart sounds.  Pulmonary:     Effort: Pulmonary effort is normal.     Breath sounds: Normal breath sounds.  Abdominal:     General: There is no distension.     Palpations: Abdomen is soft. There is no mass.  Musculoskeletal:        General: No swelling.  Neurological:     General: No focal deficit present.     Mental Status: She is alert and oriented to person, place, and time.  Psychiatric:        Mood and Affect: Mood normal.        Behavior: Behavior normal.   Laparoscopic scars from recent surgery are healing well.   LABORATORY DATA:  I have reviewed the labs as listed.  CBC    Component Value Date/Time   WBC 6.8 03/23/2019 1100   RBC 4.79 03/23/2019 1100   HGB 11.5 (L)  03/26/2019 0559   HCT 37.3 03/26/2019 0559   PLT 287 03/23/2019 1100   MCV 90.8 03/23/2019 1100   MCH 28.2 03/23/2019  1100   MCHC 31.0 03/23/2019 1100   RDW 15.2 03/23/2019 1100   LYMPHSABS 2.2 01/29/2019 1323   MONOABS 0.5 01/29/2019 1323   EOSABS 0.2 01/29/2019 1323   BASOSABS 0.1 01/29/2019 1323   CMP Latest Ref Rng & Units 03/26/2019 03/23/2019 01/29/2019  Glucose 70 - 99 mg/dL 101(H) 94 90  BUN 8 - 23 mg/dL 10 14 21   Creatinine 0.44 - 1.00 mg/dL 1.04(H) 0.61 0.61  Sodium 135 - 145 mmol/L 137 143 141  Potassium 3.5 - 5.1 mmol/L 3.8 4.7 3.8  Chloride 98 - 111 mmol/L 103 109 106  CO2 22 - 32 mmol/L 25 25 24   Calcium 8.9 - 10.3 mg/dL 8.7(L) 9.5 9.2  Total Protein 6.5 - 8.1 g/dL - 7.8 7.5  Total Bilirubin 0.3 - 1.2 mg/dL - 0.3 0.4  Alkaline Phos 38 - 126 U/L - 86 88  AST 15 - 41 U/L - 18 18  ALT 0 - 44 U/L - 12 16       DIAGNOSTIC IMAGING:  I have independently reviewed the scans and discussed with the patient.   I have reviewed Venita Lick LPN's note and agree with the documentation.  I personally performed a face-to-face visit, made revisions and my assessment and plan is as follows.    ASSESSMENT & PLAN:   Clear cell renal cell carcinoma, left (HCC) 1.  Stage III (pT3a, pNx) clear-cell renal cell carcinoma: - Adrenal sparing laparoscopic left nephrectomy on 03/25/2019 by Dr.Winter -CT scan and bone scan on 02/12/2019 was negative for metastatic disease. - We reviewed the pathology report dated 03/25/2019 which showed grade 2 clear cell renal cell carcinoma 5.8 cm, tumor invading anterior perinephric fat and segmental removing.  Negative sarcomatoid, rhabdoid features and tumor necrosis.  Margins negative. - We have discussed about surveillance versus adjuvant Sutent.  I have recommended against it. - We will do surveillance every 3 to 6 months for 3 years followed by annually up to 5 years. -We will obtain a baseline imaging and 3 months and then do CT CAP scan every 3  to 6 months for the first 3 years and then annually up to 5 years.  We will do a bone scan if symptoms warrant. -I will see her back in 3 months for follow-up with labs and scan.  2.  Stage II (T2 N0 M0) left breast Tyler: -Status post left mastectomy, diagnosed in 1990, treated with CMF followed by 5 years of tamoxifen. - Right breast mammogram on 02/05/2019 was BI-RADS Category 1.  Total time spent is 40 minutes with more than 50% of time spent face-to-face discussing pathology report, new diagnosis, surveillance/treatment options, counseling and coordination of care.  Orders placed this encounter:  Orders Placed This Encounter  Procedures  . CT Abdomen Pelvis W Contrast  . CT Chest W Contrast      Shelby Jack, MD [place] [phone]

## 2019-04-13 NOTE — Patient Instructions (Addendum)
[facility] at [facility] Discharge Instructions  You were seen today by Dr. Katragadda. He went over your recent lab results. He will see you back in 3 months for labs and follow up.   Thank you for choosing [facility] at [facility] to provide your oncology and hematology care.  To afford each patient quality time with our provider, please arrive at least 15 minutes before your scheduled appointment time.   If you have a lab appointment with the Cancer Center please come in thru the  Main Entrance and check in at the main information desk  You need to re-schedule your appointment should you arrive 10 or more minutes late.  We strive to give you quality time with our providers, and arriving late affects you and other patients whose appointments are after yours.  Also, if you no show three or more times for appointments you may be dismissed from the clinic at the providers discretion.     Again, thank you for choosing [facility].  Our hope is that these requests will decrease the amount of time that you wait before being seen by our physicians.       _____________________________________________________________  Should you have questions after your visit to [facility], please contact our office at [phone] between the hours of 8:00 a.m. and 4:30 p.m.  Voicemails left after 4:00 p.m. will not be returned until the following business day.  For prescription refill requests, have your pharmacy contact our office and allow 72 hours.    Cancer Center Support Programs:   > Cancer Support Group  2nd Tuesday of the month 1pm-2pm, Journey Room    

## 2019-07-07 ENCOUNTER — Other Ambulatory Visit (HOSPITAL_COMMUNITY): Payer: Self-pay | Admitting: *Deleted

## 2019-07-07 DIAGNOSIS — N2889 Other specified disorders of kidney and ureter: Secondary | ICD-10-CM

## 2019-07-07 DIAGNOSIS — C50912 Malignant neoplasm of unspecified site of left female breast: Secondary | ICD-10-CM

## 2019-07-07 DIAGNOSIS — C642 Malignant neoplasm of left kidney, except renal pelvis: Secondary | ICD-10-CM

## 2019-07-08 ENCOUNTER — Inpatient Hospital Stay (HOSPITAL_COMMUNITY): Payer: Medicare PPO | Attending: Hematology

## 2019-07-08 ENCOUNTER — Other Ambulatory Visit: Payer: Self-pay

## 2019-07-08 ENCOUNTER — Ambulatory Visit (HOSPITAL_COMMUNITY)
Admission: RE | Admit: 2019-07-08 | Discharge: 2019-07-08 | Disposition: A | Discharge status: Home / Self Care | Payer: Medicare PPO | Source: Ambulatory Visit | Attending: Hematology | Admitting: Hematology

## 2019-07-08 DIAGNOSIS — N2889 Other specified disorders of kidney and ureter: Secondary | ICD-10-CM

## 2019-07-08 DIAGNOSIS — E039 Hypothyroidism, unspecified: Secondary | ICD-10-CM | POA: Insufficient documentation

## 2019-07-08 DIAGNOSIS — C50912 Malignant neoplasm of unspecified site of left female breast: Secondary | ICD-10-CM

## 2019-07-08 DIAGNOSIS — Z801 Family history of malignant neoplasm of trachea, bronchus and lung: Secondary | ICD-10-CM | POA: Diagnosis not present

## 2019-07-08 DIAGNOSIS — Z79899 Other long term (current) drug therapy: Secondary | ICD-10-CM | POA: Insufficient documentation

## 2019-07-08 DIAGNOSIS — R911 Solitary pulmonary nodule: Secondary | ICD-10-CM | POA: Diagnosis not present

## 2019-07-08 DIAGNOSIS — C642 Malignant neoplasm of left kidney, except renal pelvis: Secondary | ICD-10-CM | POA: Diagnosis present

## 2019-07-08 DIAGNOSIS — Z7982 Long term (current) use of aspirin: Secondary | ICD-10-CM | POA: Insufficient documentation

## 2019-07-08 DIAGNOSIS — F17210 Nicotine dependence, cigarettes, uncomplicated: Secondary | ICD-10-CM | POA: Insufficient documentation

## 2019-07-08 DIAGNOSIS — E785 Hyperlipidemia, unspecified: Secondary | ICD-10-CM | POA: Insufficient documentation

## 2019-07-08 DIAGNOSIS — Z853 Personal history of malignant neoplasm of breast: Secondary | ICD-10-CM | POA: Diagnosis not present

## 2019-07-08 DIAGNOSIS — Z800 Family history of malignant neoplasm of digestive organs: Secondary | ICD-10-CM | POA: Insufficient documentation

## 2019-07-08 LAB — CBC WITH DIFFERENTIAL/PLATELET
Abs Immature Granulocytes: 0.01 10*3/uL (ref 0.00–0.07)
Basophils Absolute: 0.1 10*3/uL (ref 0.0–0.1)
Basophils Relative: 1 %
Eosinophils Absolute: 0.3 10*3/uL (ref 0.0–0.5)
Eosinophils Relative: 3 %
HCT: 42.9 % (ref 36.0–46.0)
Hemoglobin: 13.7 g/dL (ref 12.0–15.0)
Immature Granulocytes: 0 %
Lymphocytes Relative: 25 %
Lymphs Abs: 2.2 10*3/uL (ref 0.7–4.0)
MCH: 31 pg (ref 26.0–34.0)
MCHC: 31.9 g/dL (ref 30.0–36.0)
MCV: 97.1 fL (ref 80.0–100.0)
Monocytes Absolute: 0.5 10*3/uL (ref 0.1–1.0)
Monocytes Relative: 5 %
Neutro Abs: 5.7 10*3/uL (ref 1.7–7.7)
Neutrophils Relative %: 66 %
Platelets: 298 10*3/uL (ref 150–400)
RBC: 4.42 MIL/uL (ref 3.87–5.11)
RDW: 15.3 % (ref 11.5–15.5)
WBC: 8.7 10*3/uL (ref 4.0–10.5)
nRBC: 0 % (ref 0.0–0.2)

## 2019-07-08 LAB — COMPREHENSIVE METABOLIC PANEL
ALT: 15 U/L (ref 0–44)
AST: 25 U/L (ref 15–41)
Albumin: 4.4 g/dL (ref 3.5–5.0)
Alkaline Phosphatase: 83 U/L (ref 38–126)
Anion gap: 12 (ref 5–15)
BUN: 14 mg/dL (ref 8–23)
CO2: 26 mmol/L (ref 22–32)
Calcium: 9.6 mg/dL (ref 8.9–10.3)
Chloride: 105 mmol/L (ref 98–111)
Creatinine, Ser: 0.97 mg/dL (ref 0.44–1.00)
GFR calc Af Amer: >60 mL/min (ref >60)
GFR calc non Af Amer: 57 mL/min — ABNORMAL LOW (ref >60)
Glucose, Bld: 89 mg/dL (ref 70–99)
Potassium: 4.9 mmol/L (ref 3.5–5.1)
Sodium: 143 mmol/L (ref 135–145)
Total Bilirubin: 0.5 mg/dL (ref 0.3–1.2)
Total Protein: 7.6 g/dL (ref 6.5–8.1)

## 2019-07-08 LAB — LACTATE DEHYDROGENASE: LDH: 146 U/L (ref 98–192)

## 2019-07-08 MED ORDER — IOHEXOL 300 MG/ML  SOLN
100.0000 mL | Freq: Once | INTRAMUSCULAR | Status: AC | PRN
  Administered 2019-07-08: 11:00:00 100 mL via INTRAVENOUS

## 2019-07-13 ENCOUNTER — Other Ambulatory Visit: Payer: Self-pay

## 2019-07-13 ENCOUNTER — Encounter (HOSPITAL_COMMUNITY): Payer: Self-pay | Admitting: Hematology

## 2019-07-13 ENCOUNTER — Inpatient Hospital Stay (HOSPITAL_BASED_OUTPATIENT_CLINIC_OR_DEPARTMENT_OTHER): Payer: Medicare PPO | Admitting: Hematology

## 2019-07-13 VITALS — BP 160/79 | HR 78 | Temp 97.7°F | Resp 18 | Wt 132.6 lb

## 2019-07-13 DIAGNOSIS — R911 Solitary pulmonary nodule: Secondary | ICD-10-CM

## 2019-07-13 DIAGNOSIS — C642 Malignant neoplasm of left kidney, except renal pelvis: Secondary | ICD-10-CM | POA: Diagnosis not present

## 2019-07-13 NOTE — Progress Notes (Signed)
[address] [place], [place] [address]   CLINIC:  Medical Oncology/Hematology  PCP:  [name], [place] [address] [phone]   REASON FOR VISIT:  Follow-up for Adenocarcinoma of left breast and left kidney cancer     CANCER STAGING: Cancer Staging Adenocarcinoma of left breast Staging form: Breast, AJCC 7th Edition - Clinical: Stage IIA (T2, N0, cM0) - Signed by Baird Cancer, PA on 04/16/2012  Clear cell renal cell carcinoma, left (HCC) Staging form: Kidney, AJCC 8th Edition - Clinical: Stage III (cT3a, cNX, cM0) - Signed by Derek Jack, MD on 04/13/2019    INTERVAL HISTORY:  [name] 78 y.o. female seen for follow-up of left renal cancer.  She is a current active smoker.  Denies any cough or hemoptysis.  Appetite is 100%.  Energy levels are 75%.  No chest pains reported.  Denies any hematuria.  Denies any fevers, night sweats or weight loss.  No ER visits or hospitalizations.  REVIEW OF SYSTEMS:  Review of Systems  Neurological: Negative for dizziness.  All other systems reviewed and are negative.    PAST MEDICAL/SURGICAL HISTORY:  Past Medical History:  Diagnosis Date  . Adenocarcinoma of left breast 04/16/2012   Stage II (T2 N0 M0) left-sided adenocarcinoma breast diagnosed in 1990 by Dr. Romona Tyler and treated by Dr. Juanita Tyler on NSAB PPD-19 with CMF followed by 5 years of tamoxifen and she remains disease free thus far.   . Breast cancer ([place])    left breast/mastectomy/dx approx1990  . Gall bladder stones   . Grave's disease   . High triglycerides   . History of kidney stones    small stone  rt   . Nerve compression    pinched in neck  . Pneumonia 1990   Past Surgical History:  Procedure Laterality Date  . CARPAL TUNNEL RELEASE     rt. hand  . cataract surgery     bilateral  . EYE SURGERY    . LAPAROSCOPIC NEPHRECTOMY Left 03/25/2019   Procedure: LAPAROSCOPIC RADICAL  NEPHRECTOMY;  Surgeon: Ceasar Mons, MD;  Location: [facility];  Service: Urology;  Laterality: Left;  Marland Kitchen MASTECTOMY     left.  . TONSILLECTOMY       SOCIAL HISTORY:  Social History   Socioeconomic History  . Marital status: Divorced    Spouse name: Not on file  . Number of children: Not on file  . Years of education: Not on file  . Highest education level: Not on file  Occupational History  . Not on file  Social Needs  . Financial resource strain: Not on file  . Food insecurity    Worry: Not on file    Inability: Not on file  . Transportation needs    Medical: Not on file    Non-medical: Not on file  Tobacco Use  . Smoking status: Current Every Day Smoker    Packs/day: 1.00    Years: 60.00    Pack years: 60.00    Types: Cigarettes  . Smokeless tobacco: Never Used  Substance and Sexual Activity  . Alcohol use: No  . Drug use: No  . Sexual activity: Not on file  Lifestyle  . Physical activity    Days per week: Not on file    Minutes per session: Not on file  . Stress: Not on file  Relationships  . Social connections    Talks on phone: Not on  file    Gets together: Not on file    Attends religious service: Not on file    Active member of club or organization: Not on file    Attends meetings of clubs or organizations: Not on file    Relationship status: Not on file  . Intimate partner violence    Fear of current or ex partner: Not on file    Emotionally abused: Not on file    Physically abused: Not on file    Forced sexual activity: Not on file  Other Topics Concern  . Not on file  Social History Narrative  . Not on file    FAMILY HISTORY:  Family History  Problem Relation Age of Onset  . Cancer Father        lung cancer  . Cancer Brother        colon cancer  . Cancer Other        lung cancer/died age 18    CURRENT MEDICATIONS:  Outpatient Encounter Medications as of 07/13/2019  Medication Sig  . aspirin 325 MG tablet Take 1 tablet (325  mg total) by mouth every 6 (six) hours as needed for moderate pain. Resume in 1 week  . levothyroxine (SYNTHROID, LEVOTHROID) 100 MCG tablet Take 100 mcg by mouth daily before breakfast.   . Multiple Vitamins-Minerals (CENTRUM SILVER ULTRA WOMENS) TABS Take 1 tablet by mouth daily.   . simvastatin (ZOCOR) 20 MG tablet Take 20 mg by mouth at bedtime.    . meclizine (ANTIVERT) 50 MG tablet Take 25 mg by mouth 3 (three) times daily as needed for dizziness.    No facility-administered encounter medications on file as of 07/13/2019.     ALLERGIES:  No Known Allergies   PHYSICAL EXAM:  ECOG Performance status: 1  Vitals:   07/13/19 1445  BP: (!) 160/79  Pulse: 78  Resp: 18  Temp: 97.7 F (36.5 C)  SpO2: 95%   Filed Weights   07/13/19 1445  Weight: 132 lb 9.6 oz (60.1 kg)    Physical Exam Vitals signs reviewed.  Constitutional:      Appearance: Normal appearance.  Cardiovascular:     Rate and Rhythm: Normal rate and regular rhythm.     Heart sounds: Normal heart sounds.  Pulmonary:     Effort: Pulmonary effort is normal.     Breath sounds: Normal breath sounds.  Abdominal:     General: There is no distension.     Palpations: Abdomen is soft. There is no mass.  Musculoskeletal:        General: No swelling.  Neurological:     General: No focal deficit present.     Mental Status: She is alert and oriented to person, place, and time.  Psychiatric:        Mood and Affect: Mood normal.        Behavior: Behavior normal.      LABORATORY DATA:  I have reviewed the labs as listed.  CBC    Component Value Date/Time   WBC 8.7 07/08/2019 0958   RBC 4.42 07/08/2019 0958   HGB 13.7 07/08/2019 0958   HCT 42.9 07/08/2019 0958   PLT 298 07/08/2019 0958   MCV 97.1 07/08/2019 0958   MCH 31.0 07/08/2019 0958   MCHC 31.9 07/08/2019 0958   RDW 15.3 07/08/2019 0958   LYMPHSABS 2.2 07/08/2019 0958   MONOABS 0.5 07/08/2019 0958   EOSABS 0.3 07/08/2019 0958   BASOSABS 0.1  07/08/2019 0958   CMP  Latest Ref Rng & Units 07/08/2019 03/26/2019 03/23/2019  Glucose 70 - 99 mg/dL 89 101(H) 94  BUN 8 - 23 mg/dL 14 10 14   Creatinine 0.44 - 1.00 mg/dL 0.97 1.04(H) 0.61  Sodium 135 - 145 mmol/L 143 137 143  Potassium 3.5 - 5.1 mmol/L 4.9 3.8 4.7  Chloride 98 - 111 mmol/L 105 103 109  CO2 22 - 32 mmol/L 26 25 25   Calcium 8.9 - 10.3 mg/dL 9.6 8.7(L) 9.5  Total Protein 6.5 - 8.1 g/dL 7.6 - 7.8  Total Bilirubin 0.3 - 1.2 mg/dL 0.5 - 0.3  Alkaline Phos 38 - 126 U/L 83 - 86  AST 15 - 41 U/L 25 - 18  ALT 0 - 44 U/L 15 - 12       DIAGNOSTIC IMAGING:  I have independently reviewed the scans and discussed with the patient.   I have reviewed Venita Lick LPN's note and agree with the documentation.  I personally performed a face-to-face visit, made revisions and my assessment and plan is as follows.    ASSESSMENT & PLAN:   Clear cell renal cell carcinoma, left (HCC) 1.  Stage III (pT3a, pNx) clear-cell renal cell carcinoma: - Adrenal sparing laparoscopic left nephrectomy on 03/25/2019 by Dr.Winter -CT scan and bone scan on 02/12/2019 was negative for metastatic disease. - We reviewed the pathology report dated 03/25/2019 which showed grade 2 clear cell renal cell carcinoma 5.8 cm, tumor invading anterior perinephric fat and segmental removing.  Negative sarcomatoid, rhabdoid features and tumor necrosis.  Margins negative. -CT CAP on 07/08/2019 showed left upper lobe lung nodule measuring 1.1 cm, previously 6 mm.  Interval postoperative findings of left nephrectomy.  She is a current active smoker. -I have recommended a PET CT scan for follow-up of this lesion.  If it is positive, will consider resection/biopsy. -Surveillance visits are every 3 to 6 months for 3 years followed by annually up to 5 years.  We will do CT CAP every 3 to 6 months for the first 3 years and then annually up to 5 years.  2.  Stage II (T2 N0 M0) left breast cancer: -Status post left mastectomy,  diagnosed in 1990, treated with CMF followed by 5 years of tamoxifen. - Right breast mammogram on 02/05/2019 was BI-RADS Category 1.    Orders placed this encounter:  Orders Placed This Encounter  Procedures  . NM PET Image Initial (PI) Skull Base To Thigh      Derek Jack, MD [place] [phone]

## 2019-07-13 NOTE — Assessment & Plan Note (Addendum)
1.  Stage III (pT3a, pNx) clear-cell renal cell carcinoma: - Adrenal sparing laparoscopic left nephrectomy on 03/25/2019 by Dr.Winter -CT scan and bone scan on 02/12/2019 was negative for metastatic disease. - We reviewed the pathology report dated 03/25/2019 which showed grade 2 clear cell renal cell carcinoma 5.8 cm, tumor invading anterior perinephric fat and segmental removing.  Negative sarcomatoid, rhabdoid features and tumor necrosis.  Margins negative. -CT CAP on 07/08/2019 showed left upper lobe lung nodule measuring 1.1 cm, previously 6 mm.  Interval postoperative findings of left nephrectomy.  She is a current active smoker. -I have recommended a PET CT scan for follow-up of this lesion.  If it is positive, will consider resection/biopsy. -Surveillance visits are every 3 to 6 months for 3 years followed by annually up to 5 years.  We will do CT CAP every 3 to 6 months for the first 3 years and then annually up to 5 years.  2.  Stage II (T2 N0 M0) left breast cancer: -Status post left mastectomy, diagnosed in 1990, treated with CMF followed by 5 years of tamoxifen. - Right breast mammogram on 02/05/2019 was BI-RADS Category 1.

## 2019-07-13 NOTE — Patient Instructions (Signed)
[name] at [facility] Discharge Instructions  You were seen today by Dr. Delton Coombes. He went over your recent lab and scan results. He will schedule you for a PET scan for further evaluation. He will see you back after your scan for follow up.   Thank you for choosing [place] at [facility] to provide your oncology and hematology care.  To afford each patient quality time with our provider, please arrive at least 15 minutes before your scheduled appointment time.   If you have a lab appointment with the [facility] please come in thru the  Main Entrance and check in at the main information desk  You need to re-schedule your appointment should you arrive 10 or more minutes late.  We strive to give you quality time with our providers, and arriving late affects you and other patients whose appointments are after yours.  Also, if you no show three or more times for appointments you may be dismissed from the clinic at the providers discretion.     Again, thank you for choosing [facility].  Our hope is that these requests will decrease the amount of time that you wait before being seen by our physicians.       _____________________________________________________________  Should you have questions after your visit to [facility], please contact our office at [phone] between the hours of 8:00 a.m. and 4:30 p.m.  Voicemails left after 4:00 p.m. will not be returned until the following business day.  For prescription refill requests, have your pharmacy contact our office and allow 72 hours.    Cancer Center Support Programs:   > Cancer Support Group  2nd Tuesday of the month 1pm-2pm, Journey Room

## 2019-07-27 ENCOUNTER — Encounter (HOSPITAL_COMMUNITY)
Admission: RE | Admit: 2019-07-27 | Discharge: 2019-07-27 | Disposition: A | Discharge status: Home / Self Care | Payer: Medicare PPO | Source: Ambulatory Visit | Attending: Hematology | Admitting: Hematology

## 2019-07-27 ENCOUNTER — Other Ambulatory Visit: Payer: Self-pay

## 2019-07-27 DIAGNOSIS — R911 Solitary pulmonary nodule: Secondary | ICD-10-CM | POA: Insufficient documentation

## 2019-07-27 MED ORDER — FLUDEOXYGLUCOSE F - 18 (FDG) INJECTION
8.0700 | Freq: Once | INTRAVENOUS | Status: AC | PRN
  Administered 2019-07-27: 8.07 via INTRAVENOUS

## 2019-07-29 ENCOUNTER — Inpatient Hospital Stay (HOSPITAL_BASED_OUTPATIENT_CLINIC_OR_DEPARTMENT_OTHER): Payer: Medicare PPO | Admitting: Hematology

## 2019-07-29 ENCOUNTER — Other Ambulatory Visit: Payer: Self-pay

## 2019-07-29 ENCOUNTER — Encounter (HOSPITAL_COMMUNITY): Payer: Self-pay | Admitting: Hematology

## 2019-07-29 VITALS — BP 126/62 | HR 80 | Temp 97.3°F | Resp 18 | Wt 130.6 lb

## 2019-07-29 DIAGNOSIS — C642 Malignant neoplasm of left kidney, except renal pelvis: Secondary | ICD-10-CM | POA: Diagnosis not present

## 2019-07-29 DIAGNOSIS — R911 Solitary pulmonary nodule: Secondary | ICD-10-CM

## 2019-07-29 NOTE — Patient Instructions (Signed)
[place] at [facility] Discharge Instructions  You were seen today by Dr. Delton Coombes. He went over your recent scan results. He will schedule you for an MRI of your brain as well as a pulmonary function test. He will refer you to a cardiothoracic surgeon in [place] for a biopsy. He will follow up with you by phone after your MRI.   Thank you for choosing [place] at [facility] to provide your oncology and hematology care.  To afford each patient quality time with our provider, please arrive at least 15 minutes before your scheduled appointment time.   If you have a lab appointment with the [facility] please come in thru the  Main Entrance and check in at the main information desk  You need to re-schedule your appointment should you arrive 10 or more minutes late.  We strive to give you quality time with our providers, and arriving late affects you and other patients whose appointments are after yours.  Also, if you no show three or more times for appointments you may be dismissed from the clinic at the providers discretion.     Again, thank you for choosing [facility].  Our hope is that these requests will decrease the amount of time that you wait before being seen by our physicians.       _____________________________________________________________  Should you have questions after your visit to [facility], please contact our office at [phone] between the hours of 8:00 a.m. and 4:30 p.m.  Voicemails left after 4:00 p.m. will not be returned until the following business day.  For prescription refill requests, have your pharmacy contact our office and allow 72 hours.    Cancer Center Support Programs:   > Cancer Support Group  2nd Tuesday of the month 1pm-2pm, Journey Room

## 2019-07-29 NOTE — Assessment & Plan Note (Signed)
1.  Left upper lobe lung nodule: -CT chest on 07/08/2019 showed left upper lobe lung nodule measuring 1.1 cm, previously 6 mm. -Patient is a current active smoker. -We reviewed PET scan dated 07/27/2019 which showed hypermetabolic 1.2 cm left upper lobe pulmonary nodule, maximum SUV 7.2.  Hypermetabolic left upper hilar lymph node, maximum SUV 13.4. -Mildly asymmetric activity in the posterior right ninth rib without CT abnormality.  Patient does not have any tenderness.  She denies any injury to that area. -I have recommended MRI of the brain for further work-up.  I will also order pulmonary function testing. -I have recommended evaluation by Dr. Roxan Hockey for possible bronchoscopy and biopsy and further management.  2.  Stage III (pT3a, pNx) clear-cell renal cell carcinoma: - Adrenal sparing laparoscopic left nephrectomy on 03/25/2019 by Dr.Winter -CT scan and bone scan on 02/12/2019 was negative for metastatic disease. - We reviewed the pathology report dated 03/25/2019 which showed grade 2 clear cell renal cell carcinoma 5.8 cm, tumor invading anterior perinephric fat and segmental removing.  Negative sarcomatoid, rhabdoid features and tumor necrosis.  Margins negative. -CT CAP on 07/08/2019 showed left upper lobe lung nodule measuring 1.1 cm, previously 6 mm.  Interval postoperative findings of left nephrectomy.  She is a current active smoker. -Surveillance visits are every 3 to 6 months for 3 years followed by annually up to 5 years.  We will do CT CAP every 3 to 6 months for the first 3 years and then annually up to 5 years.  3.  Stage II (T2 N0 M0) left breast cancer: -Status post left mastectomy, diagnosed in 1990, treated with CMF followed by 5 years of tamoxifen. - Right breast mammogram on 02/05/2019 was BI-RADS Category 1.

## 2019-07-29 NOTE — Progress Notes (Signed)
[place] [place], [place] [address]   CLINIC:  Medical Oncology/Hematology  PCP:  Denyce Robert, [address] [place] [address] [phone]   REASON FOR VISIT:  Follow-up for Adenocarcinoma of left breast and left kidney cancer     CANCER STAGING: Cancer Staging Adenocarcinoma of left breast Staging form: Breast, AJCC 7th Edition - Clinical: Stage IIA (T2, N0, cM0) - Signed by Baird Cancer, PA on 04/16/2012  Clear cell renal cell carcinoma, left (HCC) Staging form: Kidney, AJCC 8th Edition - Clinical: Stage III (cT3a, cNX, cM0) - Signed by Derek Jack, MD on 04/13/2019    INTERVAL HISTORY:  [name] 78 y.o. female seen for follow-up of PET scan.  Her prior CT scan showed left upper lobe lung nodule.  Daughter is present with her.  She reported pain in the left inframammary area on and off.  Appetite is 100%.  Energy levels are 75%.  Denies any injury to her ribs on the right side.  Denies any nausea, vomiting, diarrhea or constipation.  REVIEW OF SYSTEMS:  Review of Systems  Neurological: Negative for dizziness.  All other systems reviewed and are negative.    PAST MEDICAL/SURGICAL HISTORY:  Past Medical History:  Diagnosis Date  . Adenocarcinoma of left breast 04/16/2012   Stage II (T2 N0 M0) left-sided adenocarcinoma breast diagnosed in 1990 by Dr. Romona Curls and treated by Dr. Juanita Craver on NSAB PPD-19 with CMF followed by 5 years of tamoxifen and she remains disease free thus far.   . Breast cancer ([place])    left breast/mastectomy/dx approx1990  . Gall bladder stones   . Grave's disease   . High triglycerides   . History of kidney stones    small stone  rt   . Nerve compression    pinched in neck  . Pneumonia 1990   Past Surgical History:  Procedure Laterality Date  . CARPAL TUNNEL RELEASE     rt. hand  . cataract surgery     bilateral  . EYE SURGERY    . LAPAROSCOPIC NEPHRECTOMY Left  03/25/2019   Procedure: LAPAROSCOPIC RADICAL NEPHRECTOMY;  Surgeon: Ceasar Mons, MD;  Location: [facility];  Service: Urology;  Laterality: Left;  Marland Kitchen MASTECTOMY     left.  . TONSILLECTOMY       SOCIAL HISTORY:  Social History   Socioeconomic History  . Marital status: Divorced    Spouse name: Not on file  . Number of children: Not on file  . Years of education: Not on file  . Highest education level: Not on file  Occupational History  . Not on file  Social Needs  . Financial resource strain: Not on file  . Food insecurity    Worry: Not on file    Inability: Not on file  . Transportation needs    Medical: Not on file    Non-medical: Not on file  Tobacco Use  . Smoking status: Current Every Day Smoker    Packs/day: 1.00    Years: 60.00    Pack years: 60.00    Types: Cigarettes  . Smokeless tobacco: Never Used  Substance and Sexual Activity  . Alcohol use: No  . Drug use: No  . Sexual activity: Not on file  Lifestyle  . Physical activity    Days per week: Not on file    Minutes per session: Not on file  . Stress: Not on file  Relationships  .  Social Herbalist on phone: Not on file    Gets together: Not on file    Attends religious service: Not on file    Active member of club or organization: Not on file    Attends meetings of clubs or organizations: Not on file    Relationship status: Not on file  . Intimate partner violence    Fear of current or ex partner: Not on file    Emotionally abused: Not on file    Physically abused: Not on file    Forced sexual activity: Not on file  Other Topics Concern  . Not on file  Social History Narrative  . Not on file    FAMILY HISTORY:  Family History  Problem Relation Age of Onset  . Cancer Father        lung cancer  . Cancer Brother        colon cancer  . Cancer Other        lung cancer/died age 87    CURRENT MEDICATIONS:  Outpatient Encounter Medications as of 07/29/2019  Medication Sig   . aspirin 325 MG tablet Take 1 tablet (325 mg total) by mouth every 6 (six) hours as needed for moderate pain. Resume in 1 week  . levothyroxine (SYNTHROID, LEVOTHROID) 100 MCG tablet Take 100 mcg by mouth daily before breakfast.   . Multiple Vitamins-Minerals (CENTRUM SILVER ULTRA WOMENS) TABS Take 1 tablet by mouth daily.   . simvastatin (ZOCOR) 20 MG tablet Take 20 mg by mouth at bedtime.    . meclizine (ANTIVERT) 50 MG tablet Take 25 mg by mouth 3 (three) times daily as needed for dizziness.    No facility-administered encounter medications on file as of 07/29/2019.     ALLERGIES:  No Known Allergies   PHYSICAL EXAM:  ECOG Performance status: 1  Vitals:   07/29/19 1519  BP: 126/62  Pulse: 80  Resp: 18  Temp: (!) 97.3 F (36.3 C)  SpO2: 96%   Filed Weights   07/29/19 1519  Weight: 130 lb 9.6 oz (59.2 kg)    Physical Exam Vitals signs reviewed.  Constitutional:      Appearance: Normal appearance.  Cardiovascular:     Rate and Rhythm: Normal rate and regular rhythm.     Heart sounds: Normal heart sounds.  Pulmonary:     Effort: Pulmonary effort is normal.     Breath sounds: Normal breath sounds.  Abdominal:     General: There is no distension.     Palpations: Abdomen is soft. There is no mass.  Musculoskeletal:        General: No swelling.  Neurological:     General: No focal deficit present.     Mental Status: She is alert and oriented to person, place, and time.  Psychiatric:        Mood and Affect: Mood normal.        Behavior: Behavior normal.      LABORATORY DATA:  I have reviewed the labs as listed.  CBC    Component Value Date/Time   WBC 8.7 07/08/2019 0958   RBC 4.42 07/08/2019 0958   HGB 13.7 07/08/2019 0958   HCT 42.9 07/08/2019 0958   PLT 298 07/08/2019 0958   MCV 97.1 07/08/2019 0958   MCH 31.0 07/08/2019 0958   MCHC 31.9 07/08/2019 0958   RDW 15.3 07/08/2019 0958   LYMPHSABS 2.2 07/08/2019 0958   MONOABS 0.5 07/08/2019 0958    EOSABS 0.3 07/08/2019  0958   BASOSABS 0.1 07/08/2019 0958   CMP Latest Ref Rng & Units 07/08/2019 03/26/2019 03/23/2019  Glucose 70 - 99 mg/dL 89 101(H) 94  BUN 8 - 23 mg/dL 14 10 14   Creatinine 0.44 - 1.00 mg/dL 0.97 1.04(H) 0.61  Sodium 135 - 145 mmol/L 143 137 143  Potassium 3.5 - 5.1 mmol/L 4.9 3.8 4.7  Chloride 98 - 111 mmol/L 105 103 109  CO2 22 - 32 mmol/L 26 25 25   Calcium 8.9 - 10.3 mg/dL 9.6 8.7(L) 9.5  Total Protein 6.5 - 8.1 g/dL 7.6 - 7.8  Total Bilirubin 0.3 - 1.2 mg/dL 0.5 - 0.3  Alkaline Phos 38 - 126 U/L 83 - 86  AST 15 - 41 U/L 25 - 18  ALT 0 - 44 U/L 15 - 12       DIAGNOSTIC IMAGING:  I have independently reviewed the scans and discussed with the patient.   I have reviewed Venita Lick LPN's note and agree with the documentation.  I personally performed a face-to-face visit, made revisions and my assessment and plan is as follows.    ASSESSMENT & PLAN:   Nodule of left lung 1.  Left upper lobe lung nodule: -CT chest on 07/08/2019 showed left upper lobe lung nodule measuring 1.1 cm, previously 6 mm. -Patient is a current active smoker. -We reviewed PET scan dated 07/27/2019 which showed hypermetabolic 1.2 cm left upper lobe pulmonary nodule, maximum SUV 7.2.  Hypermetabolic left upper hilar lymph node, maximum SUV 13.4. -Mildly asymmetric activity in the posterior right ninth rib without CT abnormality.  Patient does not have any tenderness.  She denies any injury to that area. -I have recommended MRI of the brain for further work-up.  I will also order pulmonary function testing. -I have recommended evaluation by Dr. Roxan Hockey for possible bronchoscopy and biopsy and further management.  2.  Stage III (pT3a, pNx) clear-cell renal cell carcinoma: - Adrenal sparing laparoscopic left nephrectomy on 03/25/2019 by Dr.Winter -CT scan and bone scan on 02/12/2019 was negative for metastatic disease. - We reviewed the pathology report dated 03/25/2019 which showed  grade 2 clear cell renal cell carcinoma 5.8 cm, tumor invading anterior perinephric fat and segmental removing.  Negative sarcomatoid, rhabdoid features and tumor necrosis.  Margins negative. -CT CAP on 07/08/2019 showed left upper lobe lung nodule measuring 1.1 cm, previously 6 mm.  Interval postoperative findings of left nephrectomy.  She is a current active smoker. -Surveillance visits are every 3 to 6 months for 3 years followed by annually up to 5 years.  We will do CT CAP every 3 to 6 months for the first 3 years and then annually up to 5 years.  3.  Stage II (T2 N0 M0) left breast cancer: -Status post left mastectomy, diagnosed in 1990, treated with CMF followed by 5 years of tamoxifen. - Right breast mammogram on 02/05/2019 was BI-RADS Category 1.   Total time spent is 25 minutes with more than 50% of the time spent face-to-face discussing scan results, further work-up and management, counseling and coordination of care.  Orders placed this encounter:  Orders Placed This Encounter  Procedures  . MR Brain W Wo Contrast  . Pulmonary Function Test      Derek Jack, MD [place] [phone]

## 2019-08-05 ENCOUNTER — Other Ambulatory Visit: Payer: Self-pay

## 2019-08-05 ENCOUNTER — Other Ambulatory Visit (HOSPITAL_COMMUNITY)
Admission: RE | Admit: 2019-08-05 | Discharge: 2019-08-05 | Disposition: A | Discharge status: Home / Self Care | Payer: Medicare PPO | Source: Ambulatory Visit | Attending: Hematology | Admitting: Hematology

## 2019-08-05 DIAGNOSIS — Z20828 Contact with and (suspected) exposure to other viral communicable diseases: Secondary | ICD-10-CM | POA: Insufficient documentation

## 2019-08-05 DIAGNOSIS — Z01812 Encounter for preprocedural laboratory examination: Secondary | ICD-10-CM | POA: Insufficient documentation

## 2019-08-05 LAB — SARS CORONAVIRUS 2 (TAT 6-24 HRS): SARS Coronavirus 2: NEGATIVE

## 2019-08-07 ENCOUNTER — Ambulatory Visit (HOSPITAL_COMMUNITY)
Admission: RE | Admit: 2019-08-07 | Discharge: 2019-08-07 | Disposition: A | Discharge status: Home / Self Care | Payer: Medicare PPO | Source: Ambulatory Visit | Attending: Hematology | Admitting: Hematology

## 2019-08-07 ENCOUNTER — Other Ambulatory Visit: Payer: Self-pay

## 2019-08-07 DIAGNOSIS — R911 Solitary pulmonary nodule: Secondary | ICD-10-CM | POA: Diagnosis present

## 2019-08-07 MED ORDER — GADOBUTROL 1 MMOL/ML IV SOLN
6.0000 mL | Freq: Once | INTRAVENOUS | Status: AC | PRN
  Administered 2019-08-07: 6 mL via INTRAVENOUS

## 2019-08-10 ENCOUNTER — Ambulatory Visit (HOSPITAL_COMMUNITY)
Admission: RE | Admit: 2019-08-10 | Discharge: 2019-08-10 | Disposition: A | Discharge status: Home / Self Care | Payer: Medicare PPO | Source: Ambulatory Visit | Attending: Hematology | Admitting: Hematology

## 2019-08-10 ENCOUNTER — Inpatient Hospital Stay (HOSPITAL_COMMUNITY): Payer: Medicare PPO | Attending: Hematology | Admitting: Hematology

## 2019-08-10 ENCOUNTER — Encounter (HOSPITAL_COMMUNITY): Payer: Self-pay | Admitting: Hematology

## 2019-08-10 ENCOUNTER — Other Ambulatory Visit (HOSPITAL_COMMUNITY): Payer: Self-pay | Admitting: Respiratory Therapy

## 2019-08-10 ENCOUNTER — Other Ambulatory Visit: Payer: Self-pay

## 2019-08-10 DIAGNOSIS — R918 Other nonspecific abnormal finding of lung field: Secondary | ICD-10-CM | POA: Diagnosis present

## 2019-08-10 DIAGNOSIS — Z853 Personal history of malignant neoplasm of breast: Secondary | ICD-10-CM | POA: Diagnosis not present

## 2019-08-10 DIAGNOSIS — Z905 Acquired absence of kidney: Secondary | ICD-10-CM | POA: Insufficient documentation

## 2019-08-10 DIAGNOSIS — F17210 Nicotine dependence, cigarettes, uncomplicated: Secondary | ICD-10-CM | POA: Diagnosis not present

## 2019-08-10 DIAGNOSIS — Z85528 Personal history of other malignant neoplasm of kidney: Secondary | ICD-10-CM | POA: Insufficient documentation

## 2019-08-10 DIAGNOSIS — Z01812 Encounter for preprocedural laboratory examination: Secondary | ICD-10-CM

## 2019-08-10 DIAGNOSIS — Z20822 Contact with and (suspected) exposure to covid-19: Secondary | ICD-10-CM

## 2019-08-10 LAB — PULMONARY FUNCTION TEST
DL/VA % pred: 60 %
DL/VA: 2.48 ml/min/mmHg/L
DLCO unc % pred: 53 %
DLCO unc: 10.53 ml/min/mmHg
FEF 25-75 Post: 1.56 L/sec
FEF 25-75 Pre: 1.03 L/sec
FEF2575-%Change-Post: 50 %
FEF2575-%Pred-Post: 93 %
FEF2575-%Pred-Pre: 61 %
FEV1-%Change-Post: 7 %
FEV1-%Pred-Post: 88 %
FEV1-%Pred-Pre: 82 %
FEV1-Post: 1.93 L
FEV1-Pre: 1.79 L
FEV1FVC-%Change-Post: 4 %
FEV1FVC-%Pred-Pre: 93 %
FEV6-%Change-Post: 1 %
FEV6-%Pred-Post: 94 %
FEV6-%Pred-Pre: 92 %
FEV6-Post: 2.59 L
FEV6-Pre: 2.56 L
FEV6FVC-%Change-Post: -1 %
FEV6FVC-%Pred-Post: 103 %
FEV6FVC-%Pred-Pre: 104 %
FVC-%Change-Post: 3 %
FVC-%Pred-Post: 92 %
FVC-%Pred-Pre: 88 %
FVC-Post: 2.67 L
FVC-Pre: 2.58 L
Post FEV1/FVC ratio: 72 %
Post FEV6/FVC ratio: 98 %
Pre FEV1/FVC ratio: 70 %
Pre FEV6/FVC Ratio: 99 %
RV % pred: 111 %
RV: 2.62 L
TLC % pred: 98 %
TLC: 5.15 L

## 2019-08-10 MED ORDER — ALBUTEROL SULFATE (2.5 MG/3ML) 0.083% IN NEBU
2.5000 mg | INHALATION_SOLUTION | Freq: Once | RESPIRATORY_TRACT | Status: AC
  Administered 2019-08-10: 2.5 mg via RESPIRATORY_TRACT

## 2019-08-10 NOTE — Progress Notes (Signed)
Virtual Visit via Telephone Note  I connected with [name] on 08/10/19 at  3:20 PM EST by telephone and verified that I am speaking with the correct person using two identifiers.   I discussed the limitations, risks, security and privacy concerns of performing an evaluation and management service by telephone and the availability of in person appointments. I also discussed with the patient that there may be a patient responsible charge related to this service. The patient expressed understanding and agreed to proceed.   History of Present Illness: She is being seen for new left upper lobe pulmonary nodule measuring 1.2 cm with SUV 7.2 and hypermetabolic left upper hilar lymph node maximum SUV 13.4.  She is a current active smoker.  She also had a history of stage III clear-cell renal cell carcinoma, status post left nephrectomy on 03/25/2019.  She also has a history of left breast cancer, stage II, status post left mastectomy 1990.   Observations/Objective: She has occasional shortness of breath.  She is continuing to smoke.  Denies any headaches or vision changes.  She reportedly had pulmonary function test done today at [facility].  Assessment and Plan:  1.  Left upper lobe lung nodule: -PET scan on 07/27/2019 shows hypermetabolic 1.2 cm left upper lobe pulmonary nodule, SUV 7.2, hypermetabolic left upper hilar lymph node, SUV 13.4. -We reviewed results of the MRI of the brain which were negative for metastatic disease. -She had PFTs done today [facility]. -She has an appointment to see Dr. Roxan Hockey on 08/18/2019. -I will see her back in 8 weeks for follow-up.   Follow Up Instructions: RTC 8 weeks.   I discussed the assessment and treatment plan with the patient. The patient was provided an opportunity to ask questions and all were answered. The patient agreed with the plan and demonstrated an understanding of the instructions.   The patient was advised to  call back or seek an in-person evaluation if the symptoms worsen or if the condition fails to improve as anticipated.  I provided 11 minutes of non-face-to-face time during this encounter.   Derek Jack, MD

## 2019-08-10 NOTE — Progress Notes (Signed)
Error

## 2019-08-10 NOTE — Patient Instructions (Signed)
[place] at [facility] Discharge Instructions  You were seen today by Dr. Delton Coombes. He went over your recent test results. He will see you back in2 months for labs and follow up.   Thank you for choosing [place] at [facility] to provide your oncology and hematology care.  To afford each patient quality time with our provider, please arrive at least 15 minutes before your scheduled appointment time.   If you have a lab appointment with the [facility] please come in thru the  Main Entrance and check in at the main information desk  You need to re-schedule your appointment should you arrive 10 or more minutes late.  We strive to give you quality time with our providers, and arriving late affects you and other patients whose appointments are after yours.  Also, if you no show three or more times for appointments you may be dismissed from the clinic at the providers discretion.     Again, thank you for choosing [facility].  Our hope is that these requests will decrease the amount of time that you wait before being seen by our physicians.       _____________________________________________________________  Should you have questions after your visit to [facility], please contact our office at [phone] between the hours of 8:00 a.m. and 4:30 p.m.  Voicemails left after 4:00 p.m. will not be returned until the following business day.  For prescription refill requests, have your pharmacy contact our office and allow 72 hours.    Cancer Center Support Programs:   > Cancer Support Group  2nd Tuesday of the month 1pm-2pm, Journey Room

## 2019-08-18 ENCOUNTER — Encounter: Payer: Medicare PPO | Admitting: Thoracic Surgery (Cardiothoracic Vascular Surgery)

## 2019-08-20 ENCOUNTER — Institutional Professional Consult (permissible substitution): Payer: Medicare PPO | Admitting: Thoracic Surgery (Cardiothoracic Vascular Surgery)

## 2019-08-20 ENCOUNTER — Encounter: Payer: Self-pay | Admitting: *Deleted

## 2019-08-20 ENCOUNTER — Other Ambulatory Visit: Payer: Self-pay

## 2019-08-20 ENCOUNTER — Other Ambulatory Visit: Payer: Self-pay | Admitting: *Deleted

## 2019-08-20 ENCOUNTER — Encounter: Payer: Self-pay | Admitting: Thoracic Surgery (Cardiothoracic Vascular Surgery)

## 2019-08-20 VITALS — BP 147/83 | HR 82 | Temp 97.9°F | Resp 16 | Ht 65.5 in | Wt 130.0 lb

## 2019-08-20 DIAGNOSIS — C642 Malignant neoplasm of left kidney, except renal pelvis: Secondary | ICD-10-CM | POA: Diagnosis not present

## 2019-08-20 DIAGNOSIS — R911 Solitary pulmonary nodule: Secondary | ICD-10-CM

## 2019-08-20 DIAGNOSIS — C50912 Malignant neoplasm of unspecified site of left female breast: Secondary | ICD-10-CM

## 2019-08-20 DIAGNOSIS — R590 Localized enlarged lymph nodes: Secondary | ICD-10-CM

## 2019-08-20 DIAGNOSIS — D381 Neoplasm of uncertain behavior of trachea, bronchus and lung: Secondary | ICD-10-CM | POA: Diagnosis not present

## 2019-08-20 NOTE — H&P (View-Only) (Signed)
PCP is Denyce Robert, FNP Referring Provider is Derek Jack, MD  Chief Complaint  Patient presents with  . Lung Lesion    LULobe.Marland KitchenMarland KitchenSurgical eval, PET Scan 07/27/19, MR Brain 08/07/19, C/A/P CT 07/08/19, PFT's 08/05/19  . Adenopathy    hilar    HPI: Mrs. Byer is here for consultation regarding a left upper lobe lung nodule.  Derica Leiber is a 78 year old woman with a history of tobacco abuse, renal cell carcinoma status post nephrectomy in July 2020, stage II adenocarcinoma of the left breast treated in 1990, hypothyroidism status post treatment of Graves' disease, and hypertriglyceridemia.  She underwent an adrenal sparing laparoscopic left nephrectomy on 03/25/2019 by Dr. Lovena Neighbours.  CT and bone scan in June 2020 were negative for metastatic disease.  A CT of the chest showed a 5 mm left upper lobe lung nodule.  A follow-up CT on 07/08/2019 showed the lung nodule increased in size to 1.1 cm.  There also was a left hilar node that was prominent but not mentioned in the report.  The PET/CT showed the left upper lobe nodule and hilar lymph node were both markedly hypermetabolic.  She continues to smoke about a pack of cigarettes a day.  She has been smoking since she was about 73 or 78 years old.  She has tried quitting but has not been successful.  She had an adverse reaction to Chantix.  She lost weight after her nephrectomy but actually has gained some weight back recently.  She complains of decreased energy since she had her surgery.  She denies any chest pain, pressure, or tightness.  She is not having any cough or wheezing.  Zubrod Score: At the time of surgery this patient's most appropriate activity status/level should be described as: []     0    Normal activity, no symptoms [x]     1    Restricted in physical strenuous activity but ambulatory, able to do out light work []     2    Ambulatory and capable of self care, unable to do work activities, up and about >50 % of waking hours                               []     3    Only limited self care, in bed greater than 50% of waking hours []     4    Completely disabled, no self care, confined to bed or chair []     5    Moribund   Past Medical History:  Diagnosis Date  . Adenocarcinoma of left breast 04/16/2012   Stage II (T2 N0 M0) left-sided adenocarcinoma breast diagnosed in 1990 by Dr. Romona Curls and treated by Dr. Juanita Craver on NSAB PPD-19 with CMF followed by 5 years of tamoxifen and she remains disease free thus far.   . Breast cancer ([place])    left breast/mastectomy/dx approx1990  . Gall bladder stones   . Grave's disease   . High triglycerides   . History of kidney stones    small stone  rt   . Nerve compression    pinched in neck  . Pneumonia 1990    Past Surgical History:  Procedure Laterality Date  . CARPAL TUNNEL RELEASE     rt. hand  . cataract surgery     bilateral  . EYE SURGERY    . LAPAROSCOPIC NEPHRECTOMY Left 03/25/2019   Procedure: LAPAROSCOPIC RADICAL NEPHRECTOMY;  Surgeon: Lovena Neighbours,  Conception Oms, MD;  Location: [facility];  Service: Urology;  Laterality: Left;  Marland Kitchen MASTECTOMY     left.  . TONSILLECTOMY      Family History  Problem Relation Age of Onset  . Cancer Father        lung cancer  . Cancer Brother        colon cancer  . Cancer Other        lung cancer/died age 47    Social History Social History   Tobacco Use  . Smoking status: Current Every Day Smoker    Packs/day: 1.00    Years: 60.00    Pack years: 60.00    Types: Cigarettes  . Smokeless tobacco: Never Used  Substance Use Topics  . Alcohol use: No  . Drug use: No    Current Outpatient Medications  Medication Sig Dispense Refill  . aspirin 325 MG tablet Take 1 tablet (325 mg total) by mouth every 6 (six) hours as needed for moderate pain. Resume in 1 week    . levothyroxine (SYNTHROID, LEVOTHROID) 100 MCG tablet Take 100 mcg by mouth daily before breakfast.     . meclizine (ANTIVERT) 50 MG tablet Take 25 mg by mouth 3  (three) times daily as needed for dizziness.     . Multiple Vitamins-Minerals (CENTRUM SILVER ULTRA WOMENS) TABS Take 1 tablet by mouth daily.     . simvastatin (ZOCOR) 20 MG tablet Take 20 mg by mouth at bedtime.       No current facility-administered medications for this visit.    No Known Allergies  Review of Systems  Constitutional: Positive for activity change and fatigue. Negative for appetite change and unexpected weight change.  HENT: Negative for trouble swallowing and voice change.   Respiratory: Negative for cough and wheezing.   Cardiovascular: Negative for chest pain and leg swelling.  Gastrointestinal: Negative for abdominal pain.  Genitourinary: Negative for difficulty urinating and dysuria.  Musculoskeletal: Negative for arthralgias and myalgias.  Neurological: Negative for seizures, syncope and weakness.  Hematological: Negative for adenopathy. Does not bruise/bleed easily.    BP (!) 147/83 (BP Location: Right Arm, Patient Position: Sitting, Cuff Size: Normal)   Pulse 82   Temp 97.9 F (36.6 C)   Resp 16   Ht 5' 5.5" (1.664 m)   Wt 130 lb (59 kg)   SpO2 94% Comment: ON RA  BMI 21.30 kg/m  Physical Exam Vitals reviewed.  Constitutional:      General: She is not in acute distress.    Appearance: Normal appearance.  HENT:     Head: Normocephalic and atraumatic.     Comments: Wearing surgical mask Eyes:     General: No scleral icterus.    Extraocular Movements: Extraocular movements intact.  Cardiovascular:     Rate and Rhythm: Normal rate and regular rhythm.     Pulses: Normal pulses.     Heart sounds: Normal heart sounds. No murmur.  Pulmonary:     Effort: Pulmonary effort is normal. No respiratory distress.     Breath sounds: Normal breath sounds. No wheezing.  Abdominal:     General: There is no distension.     Palpations: Abdomen is soft.     Tenderness: There is no abdominal tenderness.  Musculoskeletal:        General: No swelling.      Cervical back: Neck supple. No rigidity.  Lymphadenopathy:     Cervical: No cervical adenopathy.  Skin:    General: Skin is  warm and dry.  Neurological:     General: No focal deficit present.     Mental Status: She is alert and oriented to person, place, and time.     Cranial Nerves: No cranial nerve deficit.     Motor: No weakness.    Diagnostic Tests: CT chest 07/08/2019 IMPRESSION: 1. Interval postoperative findings of left nephrectomy. There is no abnormal or suspicious soft tissue at the site of left nephrectomy at postoperative baseline. Please note that tailored, multiphasic renal protocol CT is preferred for the follow-up of known renal cell carcinoma.  2. Significant interval enlargement of a left upper lobe pulmonary nodule, now measuring 1.1 cm, previously 6 mm when measured similarly (series 3, image 38). This is highly concerning for metastasis or alternately primary lung malignancy. PET-CT may be used to assess for metabolic activity given size.  3. No other evidence of metastatic disease in the chest, abdomen, or pelvis.  4.  Status post left mastectomy and axillary lymph node dissection.  5.  Emphysema (ICD10-J43.9).  6.  Coronary artery disease.  Aortic Atherosclerosis (ICD10-I70.0).   Electronically Signed   By: Eddie Candle M.D.   On: 07/08/2019 13:09 PET/CT 07/27/2019 NUCLEAR MEDICINE PET SKULL BASE TO THIGH  TECHNIQUE: 8.1 mCi F-18 FDG was injected intravenously. Full-ring PET imaging was performed from the skull base to thigh after the radiotracer. CT data was obtained and used for attenuation correction and anatomic localization.  Fasting blood glucose: 88 mg/dl  COMPARISON:  CT scan 07/08/2019  FINDINGS: Mediastinal blood pool activity: SUV max 2.1  Liver activity: SUV max NA  NECK: No significant abnormal hypermetabolic activity in this region.  Incidental CT findings: Bilateral common carotid  atherosclerotic calcification.  CHEST: 1.2 by 1.0 cm left upper lobe pulmonary nodule on image 73/3 has a maximum SUV of 7.2, compatible with malignancy.  A left upper hilar lymph node measuring 1.3 cm in short axis on image 83/3 has maximum SUV of 13.4, compatible with malignancy.  Incidental CT findings: Coronary, aortic arch, and branch vessel atherosclerotic vascular disease. Paraseptal emphysema. Mild biapical pleuroparenchymal scarring. Left mastectomy with left axillary dissection.  ABDOMEN/PELVIS: No significant abnormal hypermetabolic activity in this region.  Incidental CT findings: Chronic low-density nodularity of the left adrenal gland. Left nephrectomy. Aortoiliac atherosclerotic vascular disease. Cholelithiasis noted with a 2.0 cm gallstone on image 193/3. Mild sigmoid colon diverticulosis.  SKELETON: Faint activity posteromedially along the right ninth rib without appreciable CT abnormality, maximum SUV is 3.7 on the right compared to contralateral side 1.5.  Incidental CT findings: Spondylosis and degenerative disc disease in the lumbar spine most striking at L5-S1.  IMPRESSION: 1. Hypermetabolic 1.2 cm left upper lobe pulmonary nodule, maximum SUV 7.2. Hypermetabolic left upper hilar lymph node, maximum SUV 13.4. 2. Mildly asymmetric accentuated activity in the right ninth rib posteromedially, but without appreciable CT abnormality. Given the mild degree of accentuation this may well be benign, but surveillance imaging is suggested. 3. Other imaging findings of potential clinical significance: Aortic Atherosclerosis (ICD10-I70.0) and Emphysema (ICD10-J43.9). Coronary atherosclerosis. Prior left mastectomy. Cholelithiasis. Chronic low-density prominence of the left adrenal gland with left nephrectomy. Mild sigmoid colon diverticulosis.   Electronically Signed   By: Van Clines M.D.   On: 07/28/2019 10:03 I personally reviewed the CT and  PET/CT images and concur with the findings noted above.  90s are consistent with T1, N1, stage IIa non-small cell carcinoma of the lung. Pulmonary function testing FVC 2.58 (88%) FEV1 1.79 (82%) FEV1 1.93 (88%)  postbronchodilator TLC 5.15 (98%) DLCO 10.53 (53%)  Impression: [name] is a 78 year old woman with a history of tobacco abuse dating back to early childhood.  She also has a history of breast cancer back in 1990 and more recently had an adrenal sparing laparoscopic left nephrectomy for renal cell carcinoma in July of this year.  She had a 6 mm lung nodule noted on a CT of the chest on 02/12/2019.  That was new from August 2019.  A follow-up CT in November showed the left upper lobe nodule had increased to 11 mm.  This was a spiculated nodule that was highly suspicious for a new primary bronchogenic carcinoma.  There also was some hilar adenopathy that was not noted in the report.  A PET/CT showed the left upper lobe nodule was hypermetabolic with an SUV of 7.2 and the left hilar node had SUV of 13.4.  Findings are most consistent with a stage IIa (T1, N1) non-small cell carcinoma.  Other possibilities include limited stage small cell cancer, metastatic renal cell cancer, and infectious and inflammatory nodules.  Infectious or inflammatory nodules are far less likely given the degree of hypermetabolic activity in these relatively small lesions.  Despite her long history of tobacco abuse dating back to age 65 or 59, she has adequate pulmonary function to tolerate resection.  Given the high likelihood that this is either a primary bronchogenic carcinoma or metastatic renal cell with disease limited to this 1 site resection offers her the best chance of survival.  We did discuss alternative treatments such as radiation or combined chemotherapy and radiation.  She understands that if this is lung cancer, postoperative adjuvant chemotherapy would be recommended even if there is a complete resection,  assuming that the lymph node is positive.  She wishes to pursue surgical resection. We did discuss the possibility of bronchoscopic biopsy prior to surgery.  Given the high index of suspicion a negative bronchoscopic biopsy would not be reassuring and I would recommend surgical resection anyway.  We discussed the proposed surgical procedure which would be a robotic left VATS for left upper lobectomy and node dissection.  I informed her of the general nature of the procedure including the incisions to be used, the use of drainage tube postoperatively, the expected hospital stay, and the overall recovery.  She does understand that there is a potential for need for conversion to an open procedure, particularly in light of the position of the active node along the main pulmonary artery.  We discussed the indications, risks, benefits, and alternatives.  She understands the risk include, but not limited to death, MI, DVT, PE, bleeding, possible need for transfusion, infection, prolonged air leak, cardiac arrhythmias, as well as the possibility of other unforeseeable complications.  She understands and accepts the risk and wishes to proceed.  Plan: Robotic left VATS for left upper lobectomy on Friday, 09/10/2018  [name], MD [facility] [phone]

## 2019-08-20 NOTE — Progress Notes (Signed)
PCP is Denyce Robert, FNP Referring Provider is Derek Jack, MD  Chief Complaint  Patient presents with  . Lung Lesion    LULobe.Marland KitchenMarland KitchenSurgical eval, PET Scan 07/27/19, MR Brain 08/07/19, C/A/P CT 07/08/19, PFT's 08/05/19  . Adenopathy    hilar    HPI: Mrs. Flores is here for consultation regarding a left upper lobe lung nodule.  Cleta Heatley is a 78 year old woman with a history of tobacco abuse, renal cell carcinoma status post nephrectomy in July 2020, stage II adenocarcinoma of the left breast treated in 1990, hypothyroidism status post treatment of Graves' disease, and hypertriglyceridemia.  She underwent an adrenal sparing laparoscopic left nephrectomy on 03/25/2019 by Dr. Lovena Neighbours.  CT and bone scan in June 2020 were negative for metastatic disease.  A CT of the chest showed a 5 mm left upper lobe lung nodule.  A follow-up CT on 07/08/2019 showed the lung nodule increased in size to 1.1 cm.  There also was a left hilar node that was prominent but not mentioned in the report.  The PET/CT showed the left upper lobe nodule and hilar lymph node were both markedly hypermetabolic.  She continues to smoke about a pack of cigarettes a day.  She has been smoking since she was about 72 or 78 years old.  She has tried quitting but has not been successful.  She had an adverse reaction to Chantix.  She lost weight after her nephrectomy but actually has gained some weight back recently.  She complains of decreased energy since she had her surgery.  She denies any chest pain, pressure, or tightness.  She is not having any cough or wheezing.  Zubrod Score: At the time of surgery this patient's most appropriate activity status/level should be described as: []     0    Normal activity, no symptoms [x]     1    Restricted in physical strenuous activity but ambulatory, able to do out light work []     2    Ambulatory and capable of self care, unable to do work activities, up and about >50 % of waking hours                               []     3    Only limited self care, in bed greater than 50% of waking hours []     4    Completely disabled, no self care, confined to bed or chair []     5    Moribund   Past Medical History:  Diagnosis Date  . Adenocarcinoma of left breast 04/16/2012   Stage II (T2 N0 M0) left-sided adenocarcinoma breast diagnosed in 1990 by Dr. Romona Curls and treated by Dr. Juanita Craver on NSAB PPD-19 with CMF followed by 5 years of tamoxifen and she remains disease free thus far.   . Breast cancer ([place])    left breast/mastectomy/dx approx1990  . Gall bladder stones   . Grave's disease   . High triglycerides   . History of kidney stones    small stone  rt   . Nerve compression    pinched in neck  . Pneumonia 1990    Past Surgical History:  Procedure Laterality Date  . CARPAL TUNNEL RELEASE     rt. hand  . cataract surgery     bilateral  . EYE SURGERY    . LAPAROSCOPIC NEPHRECTOMY Left 03/25/2019   Procedure: LAPAROSCOPIC RADICAL NEPHRECTOMY;  Surgeon: Lovena Neighbours,  Conception Oms, MD;  Location: [facility];  Service: Urology;  Laterality: Left;  Marland Kitchen MASTECTOMY     left.  . TONSILLECTOMY      Family History  Problem Relation Age of Onset  . Cancer Father        lung cancer  . Cancer Brother        colon cancer  . Cancer Other        lung cancer/died age 87    Social History Social History   Tobacco Use  . Smoking status: Current Every Day Smoker    Packs/day: 1.00    Years: 60.00    Pack years: 60.00    Types: Cigarettes  . Smokeless tobacco: Never Used  Substance Use Topics  . Alcohol use: No  . Drug use: No    Current Outpatient Medications  Medication Sig Dispense Refill  . aspirin 325 MG tablet Take 1 tablet (325 mg total) by mouth every 6 (six) hours as needed for moderate pain. Resume in 1 week    . levothyroxine (SYNTHROID, LEVOTHROID) 100 MCG tablet Take 100 mcg by mouth daily before breakfast.     . meclizine (ANTIVERT) 50 MG tablet Take 25 mg by mouth 3  (three) times daily as needed for dizziness.     . Multiple Vitamins-Minerals (CENTRUM SILVER ULTRA WOMENS) TABS Take 1 tablet by mouth daily.     . simvastatin (ZOCOR) 20 MG tablet Take 20 mg by mouth at bedtime.       No current facility-administered medications for this visit.    No Known Allergies  Review of Systems  Constitutional: Positive for activity change and fatigue. Negative for appetite change and unexpected weight change.  HENT: Negative for trouble swallowing and voice change.   Respiratory: Negative for cough and wheezing.   Cardiovascular: Negative for chest pain and leg swelling.  Gastrointestinal: Negative for abdominal pain.  Genitourinary: Negative for difficulty urinating and dysuria.  Musculoskeletal: Negative for arthralgias and myalgias.  Neurological: Negative for seizures, syncope and weakness.  Hematological: Negative for adenopathy. Does not bruise/bleed easily.    BP (!) 147/83 (BP Location: Right Arm, Patient Position: Sitting, Cuff Size: Normal)   Pulse 82   Temp 97.9 F (36.6 C)   Resp 16   Ht 5' 5.5" (1.664 m)   Wt 130 lb (59 kg)   SpO2 94% Comment: ON RA  BMI 21.30 kg/m  Physical Exam Vitals reviewed.  Constitutional:      General: She is not in acute distress.    Appearance: Normal appearance.  HENT:     Head: Normocephalic and atraumatic.     Comments: Wearing surgical mask Eyes:     General: No scleral icterus.    Extraocular Movements: Extraocular movements intact.  Cardiovascular:     Rate and Rhythm: Normal rate and regular rhythm.     Pulses: Normal pulses.     Heart sounds: Normal heart sounds. No murmur.  Pulmonary:     Effort: Pulmonary effort is normal. No respiratory distress.     Breath sounds: Normal breath sounds. No wheezing.  Abdominal:     General: There is no distension.     Palpations: Abdomen is soft.     Tenderness: There is no abdominal tenderness.  Musculoskeletal:        General: No swelling.      Cervical back: Neck supple. No rigidity.  Lymphadenopathy:     Cervical: No cervical adenopathy.  Skin:    General: Skin is  warm and dry.  Neurological:     General: No focal deficit present.     Mental Status: She is alert and oriented to person, place, and time.     Cranial Nerves: No cranial nerve deficit.     Motor: No weakness.    Diagnostic Tests: CT chest 07/08/2019 IMPRESSION: 1. Interval postoperative findings of left nephrectomy. There is no abnormal or suspicious soft tissue at the site of left nephrectomy at postoperative baseline. Please note that tailored, multiphasic renal protocol CT is preferred for the follow-up of known renal cell carcinoma.  2. Significant interval enlargement of a left upper lobe pulmonary nodule, now measuring 1.1 cm, previously 6 mm when measured similarly (series 3, image 38). This is highly concerning for metastasis or alternately primary lung malignancy. PET-CT may be used to assess for metabolic activity given size.  3. No other evidence of metastatic disease in the chest, abdomen, or pelvis.  4.  Status post left mastectomy and axillary lymph node dissection.  5.  Emphysema (ICD10-J43.9).  6.  Coronary artery disease.  Aortic Atherosclerosis (ICD10-I70.0).   Electronically Signed   By: Eddie Candle M.D.   On: 07/08/2019 13:09 PET/CT 07/27/2019 NUCLEAR MEDICINE PET SKULL BASE TO THIGH  TECHNIQUE: 8.1 mCi F-18 FDG was injected intravenously. Full-ring PET imaging was performed from the skull base to thigh after the radiotracer. CT data was obtained and used for attenuation correction and anatomic localization.  Fasting blood glucose: 88 mg/dl  COMPARISON:  CT scan 07/08/2019  FINDINGS: Mediastinal blood pool activity: SUV max 2.1  Liver activity: SUV max NA  NECK: No significant abnormal hypermetabolic activity in this region.  Incidental CT findings: Bilateral common carotid  atherosclerotic calcification.  CHEST: 1.2 by 1.0 cm left upper lobe pulmonary nodule on image 73/3 has a maximum SUV of 7.2, compatible with malignancy.  A left upper hilar lymph node measuring 1.3 cm in short axis on image 83/3 has maximum SUV of 13.4, compatible with malignancy.  Incidental CT findings: Coronary, aortic arch, and branch vessel atherosclerotic vascular disease. Paraseptal emphysema. Mild biapical pleuroparenchymal scarring. Left mastectomy with left axillary dissection.  ABDOMEN/PELVIS: No significant abnormal hypermetabolic activity in this region.  Incidental CT findings: Chronic low-density nodularity of the left adrenal gland. Left nephrectomy. Aortoiliac atherosclerotic vascular disease. Cholelithiasis noted with a 2.0 cm gallstone on image 193/3. Mild sigmoid colon diverticulosis.  SKELETON: Faint activity posteromedially along the right ninth rib without appreciable CT abnormality, maximum SUV is 3.7 on the right compared to contralateral side 1.5.  Incidental CT findings: Spondylosis and degenerative disc disease in the lumbar spine most striking at L5-S1.  IMPRESSION: 1. Hypermetabolic 1.2 cm left upper lobe pulmonary nodule, maximum SUV 7.2. Hypermetabolic left upper hilar lymph node, maximum SUV 13.4. 2. Mildly asymmetric accentuated activity in the right ninth rib posteromedially, but without appreciable CT abnormality. Given the mild degree of accentuation this may well be benign, but surveillance imaging is suggested. 3. Other imaging findings of potential clinical significance: Aortic Atherosclerosis (ICD10-I70.0) and Emphysema (ICD10-J43.9). Coronary atherosclerosis. Prior left mastectomy. Cholelithiasis. Chronic low-density prominence of the left adrenal gland with left nephrectomy. Mild sigmoid colon diverticulosis.   Electronically Signed   By: Van Clines M.D.   On: 07/28/2019 10:03 I personally reviewed the CT and  PET/CT images and concur with the findings noted above.  90s are consistent with T1, N1, stage IIa non-small cell carcinoma of the lung. Pulmonary function testing FVC 2.58 (88%) FEV1 1.79 (82%) FEV1 1.93 (88%)  postbronchodilator TLC 5.15 (98%) DLCO 10.53 (53%)  Impression: Reni Hausner is a 78 year old woman with a history of tobacco abuse dating back to early childhood.  She also has a history of breast cancer back in 1990 and more recently had an adrenal sparing laparoscopic left nephrectomy for renal cell carcinoma in July of this year.  She had a 6 mm lung nodule noted on a CT of the chest on 02/12/2019.  That was new from August 2019.  A follow-up CT in November showed the left upper lobe nodule had increased to 11 mm.  This was a spiculated nodule that was highly suspicious for a new primary bronchogenic carcinoma.  There also was some hilar adenopathy that was not noted in the report.  A PET/CT showed the left upper lobe nodule was hypermetabolic with an SUV of 7.2 and the left hilar node had SUV of 13.4.  Findings are most consistent with a stage IIa (T1, N1) non-small cell carcinoma.  Other possibilities include limited stage small cell cancer, metastatic renal cell cancer, and infectious and inflammatory nodules.  Infectious or inflammatory nodules are far less likely given the degree of hypermetabolic activity in these relatively small lesions.  Despite her long history of tobacco abuse dating back to age 65 or 32, she has adequate pulmonary function to tolerate resection.  Given the high likelihood that this is either a primary bronchogenic carcinoma or metastatic renal cell with disease limited to this 1 site resection offers her the best chance of survival.  We did discuss alternative treatments such as radiation or combined chemotherapy and radiation.  She understands that if this is lung cancer, postoperative adjuvant chemotherapy would be recommended even if there is a complete resection,  assuming that the lymph node is positive.  She wishes to pursue surgical resection. We did discuss the possibility of bronchoscopic biopsy prior to surgery.  Given the high index of suspicion a negative bronchoscopic biopsy would not be reassuring and I would recommend surgical resection anyway.  We discussed the proposed surgical procedure which would be a robotic left VATS for left upper lobectomy and node dissection.  I informed her of the general nature of the procedure including the incisions to be used, the use of drainage tube postoperatively, the expected hospital stay, and the overall recovery.  She does understand that there is a potential for need for conversion to an open procedure, particularly in light of the position of the active node along the main pulmonary artery.  We discussed the indications, risks, benefits, and alternatives.  She understands the risk include, but not limited to death, MI, DVT, PE, bleeding, possible need for transfusion, infection, prolonged air leak, cardiac arrhythmias, as well as the possibility of other unforeseeable complications.  She understands and accepts the risk and wishes to proceed.  Plan: Robotic left VATS for left upper lobectomy on Friday, 09/10/2018  [name], MD [facility] [phone]

## 2019-09-08 NOTE — Progress Notes (Signed)
[id number] - [place], [place] [address] [place] [address] Phone: [phone] Fax: [phone]  [facility] - [place], [place] [address] Phone: [phone] Fax: [phone]      Your procedure is scheduled on 0930  Report to [facility] Main Entrance "A" at 0930 A.M., and check in at the Admitting office.  Call this number if you have problems the morning of surgery:  [phone]  Call [phone] if you have any questions prior to your surgery date Monday-Friday 8am-4pm    Remember:  Do not eat or drink after midnight the night before your surgery     Take these medicines the morning of surgery with A SIP OF WATER  Eye drop levothyroxine (SYNTHROID, LEVOTHROID) meclizine (ANTIVERT) if needed  Follow your surgeon's instructions on when to stop Aspirin.  If no instructions were given by your surgeon then you will need to call the office to get those instructions.    7 days prior to surgery STOP taking any Aspirin (unless otherwise instructed by your surgeon), Aleve, Naproxen, Ibuprofen, Motrin, Advil, Goody's, BC's, all herbal medications, fish oil, and all vitamins.    The Morning of Surgery  Do not wear jewelry, make-up or nail polish.  Do not wear lotions, powders, or perfumes/colognes, or deodorant  Do not shave 48 hours prior to surgery.    Do not bring valuables to the hospital.  [facility] is not responsible for any belongings or valuables.  If you are a smoker, DO NOT Smoke 24 hours prior to surgery  If you wear a CPAP at night please bring your mask, tubing, and machine the morning of surgery   Remember that you must have someone to transport you home after your surgery, and remain with you for 24 hours if you are discharged the same day.   Please bring cases for contacts, glasses, hearing aids, dentures or bridgework because it cannot be worn  into surgery.    Leave your suitcase in the car.  After surgery it may be brought to your room.  For patients admitted to the hospital, discharge time will be determined by your treatment team.  Patients discharged the day of surgery will not be allowed to drive home.    Special instructions:   [place]- Preparing For Surgery  Before surgery, you can play an important role. Because skin is not sterile, your skin needs to be as free of germs as possible. You can reduce the number of germs on your skin by washing with CHG (chlorahexidine gluconate) Soap before surgery.  CHG is an antiseptic cleaner which kills germs and bonds with the skin to continue killing germs even after washing.    Oral Hygiene is also important to reduce your risk of infection.  Remember - BRUSH YOUR TEETH THE MORNING OF SURGERY WITH YOUR REGULAR TOOTHPASTE  Please do not use if you have an allergy to CHG or antibacterial soaps. If your skin becomes reddened/irritated stop using the CHG.  Do not shave (including legs and underarms) for at least 48 hours prior to first CHG shower. It is OK to shave your face.  Please follow these instructions carefully.   1. Shower the NIGHT BEFORE SURGERY and the MORNING OF SURGERY with CHG Soap.   2. If you chose to wash your hair, wash your hair first as usual with your normal shampoo.  3. After you shampoo,  rinse your hair and body thoroughly to remove the shampoo.  4. Use CHG as you would any other liquid soap. You can apply CHG directly to the skin and wash gently with a scrungie or a clean washcloth.   5. Apply the CHG Soap to your body ONLY FROM THE NECK DOWN.  Do not use on open wounds or open sores. Avoid contact with your eyes, ears, mouth and genitals (private parts). Wash Face and genitals (private parts)  with your normal soap.   6. Wash thoroughly, paying special attention to the area where your surgery will be performed.  7. Thoroughly rinse your body with  warm water from the neck down.  8. DO NOT shower/wash with your normal soap after using and rinsing off the CHG Soap.  9. Pat yourself dry with a CLEAN TOWEL.  10. Wear CLEAN PAJAMAS to bed the night before surgery, wear comfortable clothes the morning of surgery  11. Place CLEAN SHEETS on your bed the night of your first shower and DO NOT SLEEP WITH PETS.    Day of Surgery:  Please shower the morning of surgery with the CHG soap Do not apply any deodorants/lotions. Please wear clean clothes to the hospital/surgery center.   Remember to brush your teeth WITH YOUR REGULAR TOOTHPASTE.   Please read over the following fact sheets that you were given.

## 2019-09-09 ENCOUNTER — Encounter (HOSPITAL_COMMUNITY): Payer: Self-pay

## 2019-09-09 ENCOUNTER — Ambulatory Visit (HOSPITAL_COMMUNITY)
Admission: RE | Admit: 2019-09-09 | Discharge: 2019-09-09 | Disposition: A | Discharge status: Home / Self Care | Payer: Medicare PPO | Source: Ambulatory Visit | Attending: Thoracic Surgery (Cardiothoracic Vascular Surgery) | Admitting: Thoracic Surgery (Cardiothoracic Vascular Surgery)

## 2019-09-09 ENCOUNTER — Encounter (HOSPITAL_COMMUNITY)
Admission: RE | Admit: 2019-09-09 | Discharge: 2019-09-09 | Disposition: A | Discharge status: Home / Self Care | Payer: Medicare PPO | Source: Ambulatory Visit | Attending: Thoracic Surgery (Cardiothoracic Vascular Surgery) | Admitting: Thoracic Surgery (Cardiothoracic Vascular Surgery)

## 2019-09-09 ENCOUNTER — Other Ambulatory Visit: Payer: Self-pay

## 2019-09-09 ENCOUNTER — Other Ambulatory Visit (HOSPITAL_COMMUNITY)
Admission: RE | Admit: 2019-09-09 | Discharge: 2019-09-09 | Disposition: A | Discharge status: Home / Self Care | Payer: Medicare PPO | Source: Ambulatory Visit | Attending: Thoracic Surgery (Cardiothoracic Vascular Surgery) | Admitting: Thoracic Surgery (Cardiothoracic Vascular Surgery)

## 2019-09-09 DIAGNOSIS — R911 Solitary pulmonary nodule: Secondary | ICD-10-CM

## 2019-09-09 DIAGNOSIS — Z01812 Encounter for preprocedural laboratory examination: Secondary | ICD-10-CM | POA: Insufficient documentation

## 2019-09-09 DIAGNOSIS — Z20822 Contact with and (suspected) exposure to covid-19: Secondary | ICD-10-CM | POA: Insufficient documentation

## 2019-09-09 HISTORY — DX: Nausea with vomiting, unspecified: R11.2

## 2019-09-09 HISTORY — DX: Other specified postprocedural states: Z98.890

## 2019-09-09 LAB — COMPREHENSIVE METABOLIC PANEL
ALT: 10 U/L (ref 0–44)
AST: 20 U/L (ref 15–41)
Albumin: 4 g/dL (ref 3.5–5.0)
Alkaline Phosphatase: 75 U/L (ref 38–126)
Anion gap: 11 (ref 5–15)
BUN: 17 mg/dL (ref 8–23)
CO2: 19 mmol/L — ABNORMAL LOW (ref 22–32)
Calcium: 9.5 mg/dL (ref 8.9–10.3)
Chloride: 108 mmol/L (ref 98–111)
Creatinine, Ser: 0.93 mg/dL (ref 0.44–1.00)
GFR calc Af Amer: >60 mL/min (ref >60)
GFR calc non Af Amer: 60 mL/min — ABNORMAL LOW (ref >60)
Glucose, Bld: 90 mg/dL (ref 70–99)
Potassium: 3.9 mmol/L (ref 3.5–5.1)
Sodium: 138 mmol/L (ref 135–145)
Total Bilirubin: 0.6 mg/dL (ref 0.3–1.2)
Total Protein: 6.8 g/dL (ref 6.5–8.1)

## 2019-09-09 LAB — SURGICAL PCR SCREEN
MRSA, PCR: NEGATIVE
Staphylococcus aureus: NEGATIVE

## 2019-09-09 LAB — URINALYSIS, ROUTINE W REFLEX MICROSCOPIC
Bilirubin Urine: NEGATIVE
Glucose, UA: NEGATIVE mg/dL
Hgb urine dipstick: NEGATIVE
Ketones, ur: NEGATIVE mg/dL
Nitrite: NEGATIVE
Protein, ur: NEGATIVE mg/dL
Specific Gravity, Urine: 1.008 (ref 1.005–1.030)
pH: 5 (ref 5.0–8.0)

## 2019-09-09 LAB — BLOOD GAS, ARTERIAL
Acid-base deficit: 0.7 mmol/L (ref 0.0–2.0)
Bicarbonate: 23.2 mmol/L (ref 20.0–28.0)
Drawn by: 421801
FIO2: 21
O2 Saturation: 96.6 %
Patient temperature: 37
pCO2 arterial: 36.6 mmHg (ref 32.0–48.0)
pH, Arterial: 7.419 (ref 7.350–7.450)
pO2, Arterial: 83.2 mmHg (ref 83.0–108.0)

## 2019-09-09 LAB — CBC
HCT: 44.6 % (ref 36.0–46.0)
Hemoglobin: 14.4 g/dL (ref 12.0–15.0)
MCH: 31.2 pg (ref 26.0–34.0)
MCHC: 32.3 g/dL (ref 30.0–36.0)
MCV: 96.7 fL (ref 80.0–100.0)
Platelets: 298 10*3/uL (ref 150–400)
RBC: 4.61 MIL/uL (ref 3.87–5.11)
RDW: 13.1 % (ref 11.5–15.5)
WBC: 7.5 10*3/uL (ref 4.0–10.5)
nRBC: 0 % (ref 0.0–0.2)

## 2019-09-09 LAB — APTT: aPTT: 33 seconds (ref 24–36)

## 2019-09-09 LAB — PROTIME-INR
INR: 1 (ref 0.8–1.2)
Prothrombin Time: 12.8 seconds (ref 11.4–15.2)

## 2019-09-09 LAB — ABO/RH: ABO/RH(D): B POS

## 2019-09-09 LAB — SARS CORONAVIRUS 2 (TAT 6-24 HRS): SARS Coronavirus 2: NEGATIVE

## 2019-09-09 NOTE — Progress Notes (Signed)
PCP - Denyce Robert Cardiologist - denies  Chest x-ray - 09/09/19 EKG - 09/09/19 Stress Test - denies ECHO - denies Cardiac Cath - denies   Aspirin Instructions: continue but not take the DOS   COVID TEST- 09/09/19 collected   Anesthesia review: yes, abnormall EKG  Patient denies shortness of breath, fever, cough and chest pain at PAT appointment   All instructions explained to the patient, with a verbal understanding of the material. Patient agrees to go over the instructions while at home for a better understanding. Patient also instructed to self quarantine after being tested for COVID-19. The opportunity to ask questions was provided.

## 2019-09-10 NOTE — Progress Notes (Signed)
Anesthesia Chart Review:  S/p recent left adrenal sparing nephrectomy 4/70/76 without complication.   Preop labs reviewed, WNL.   EKG 09/08/18: Normal sinus rhythm. Rte 70. Possible Left atrial enlargement. Left axis deviation. Incomplete right bundle branch block. Minimal voltage criteria for LVH, may be normal variant ( Cornell product ). Septal infarct , age undetermined. Does not appear significantly changed from prior.    Wynonia Musty [facility] Short Stay Center/Anesthesiology Phone [phone] 09/10/2019 9:28 AM

## 2019-09-10 NOTE — Anesthesia Preprocedure Evaluation (Addendum)
Anesthesia Evaluation  Patient identified by MRN, date of birth, ID band Patient awake    Reviewed: Allergy & Precautions, NPO status , Patient's Chart, lab work & pertinent test results  History of Anesthesia Complications (+) PONV and DIFFICULT AIRWAY  Airway Mallampati: II  TM Distance: >3 FB     Dental   Pulmonary pneumonia, Current Smoker and Patient abstained from smoking.,    breath sounds clear to auscultation       Cardiovascular negative cardio ROS   Rhythm:Regular Rate:Normal     Neuro/Psych    GI/Hepatic negative GI ROS, Neg liver ROS,   Endo/Other  Hyperthyroidism   Renal/GU Renal disease     Musculoskeletal   Abdominal   Peds  Hematology   Anesthesia Other Findings   Reproductive/Obstetrics                           Anesthesia Physical Anesthesia Plan  ASA: III  Anesthesia Plan: General   Post-op Pain Management:    Induction: Intravenous  PONV Risk Score and Plan: 3 and Ondansetron, Dexamethasone and Midazolam  Airway Management Planned: Double Lumen EBT  Additional Equipment:   Intra-op Plan:   Post-operative Plan: Possible Post-op intubation/ventilation  Informed Consent: I have reviewed the patients History and Physical, chart, labs and discussed the procedure including the risks, benefits and alternatives for the proposed anesthesia with the patient or authorized representative who has indicated his/her understanding and acceptance.     Dental advisory given  Plan Discussed with: Anesthesiologist and CRNA  Anesthesia Plan Comments: (S/p recent left adrenal sparing nephrectomy 8/33/38 without complication.   Preop labs reviewed, WNL.   EKG 09/08/18: Normal sinus rhythm. Rte 70. Possible Left atrial enlargement. Left axis deviation. Incomplete right bundle branch block. Minimal voltage criteria for LVH, may be normal variant ( Cornell product ). Septal  infarct , age undetermined. Does not appear significantly changed from prior.  )       Anesthesia Quick Evaluation

## 2019-09-11 ENCOUNTER — Inpatient Hospital Stay (HOSPITAL_COMMUNITY): Payer: Medicare PPO | Admitting: Anesthesiology

## 2019-09-11 ENCOUNTER — Inpatient Hospital Stay (HOSPITAL_COMMUNITY): Payer: Medicare PPO | Admitting: Physician Assistant

## 2019-09-11 ENCOUNTER — Encounter (HOSPITAL_COMMUNITY)
Admission: RE | Disposition: A | Discharge status: Home Health | Payer: Self-pay | Source: Ambulatory Visit | Attending: Thoracic Surgery (Cardiothoracic Vascular Surgery)

## 2019-09-11 ENCOUNTER — Inpatient Hospital Stay (HOSPITAL_COMMUNITY)
Admission: RE | Admit: 2019-09-11 | Discharge: 2019-09-22 | DRG: 164 | Disposition: A | Discharge status: Home Health | Payer: Medicare PPO | Attending: Thoracic Surgery (Cardiothoracic Vascular Surgery) | Admitting: Thoracic Surgery (Cardiothoracic Vascular Surgery)

## 2019-09-11 ENCOUNTER — Other Ambulatory Visit: Payer: Self-pay

## 2019-09-11 ENCOUNTER — Encounter (HOSPITAL_COMMUNITY): Payer: Self-pay | Admitting: Thoracic Surgery (Cardiothoracic Vascular Surgery)

## 2019-09-11 ENCOUNTER — Inpatient Hospital Stay (HOSPITAL_COMMUNITY): Payer: Medicare PPO

## 2019-09-11 DIAGNOSIS — D62 Acute posthemorrhagic anemia: Secondary | ICD-10-CM | POA: Diagnosis not present

## 2019-09-11 DIAGNOSIS — Z09 Encounter for follow-up examination after completed treatment for conditions other than malignant neoplasm: Secondary | ICD-10-CM

## 2019-09-11 DIAGNOSIS — N2889 Other specified disorders of kidney and ureter: Secondary | ICD-10-CM | POA: Diagnosis present

## 2019-09-11 DIAGNOSIS — C3490 Malignant neoplasm of unspecified part of unspecified bronchus or lung: Secondary | ICD-10-CM | POA: Diagnosis present

## 2019-09-11 DIAGNOSIS — M5136 Other intervertebral disc degeneration, lumbar region: Secondary | ICD-10-CM | POA: Diagnosis present

## 2019-09-11 DIAGNOSIS — Z20822 Contact with and (suspected) exposure to covid-19: Secondary | ICD-10-CM | POA: Diagnosis not present

## 2019-09-11 DIAGNOSIS — Z853 Personal history of malignant neoplasm of breast: Secondary | ICD-10-CM | POA: Diagnosis not present

## 2019-09-11 DIAGNOSIS — I700 Atherosclerosis of aorta: Secondary | ICD-10-CM | POA: Diagnosis present

## 2019-09-11 DIAGNOSIS — Z9012 Acquired absence of left breast and nipple: Secondary | ICD-10-CM | POA: Diagnosis not present

## 2019-09-11 DIAGNOSIS — C3412 Malignant neoplasm of upper lobe, left bronchus or lung: Secondary | ICD-10-CM | POA: Diagnosis not present

## 2019-09-11 DIAGNOSIS — Z87442 Personal history of urinary calculi: Secondary | ICD-10-CM

## 2019-09-11 DIAGNOSIS — Z85528 Personal history of other malignant neoplasm of kidney: Secondary | ICD-10-CM | POA: Diagnosis not present

## 2019-09-11 DIAGNOSIS — Z800 Family history of malignant neoplasm of digestive organs: Secondary | ICD-10-CM

## 2019-09-11 DIAGNOSIS — E781 Pure hyperglyceridemia: Secondary | ICD-10-CM | POA: Diagnosis present

## 2019-09-11 DIAGNOSIS — E278 Other specified disorders of adrenal gland: Secondary | ICD-10-CM | POA: Diagnosis present

## 2019-09-11 DIAGNOSIS — Z801 Family history of malignant neoplasm of trachea, bronchus and lung: Secondary | ICD-10-CM

## 2019-09-11 DIAGNOSIS — Z9689 Presence of other specified functional implants: Secondary | ICD-10-CM

## 2019-09-11 DIAGNOSIS — F17210 Nicotine dependence, cigarettes, uncomplicated: Secondary | ICD-10-CM | POA: Diagnosis not present

## 2019-09-11 DIAGNOSIS — J95811 Postprocedural pneumothorax: Secondary | ICD-10-CM | POA: Diagnosis not present

## 2019-09-11 DIAGNOSIS — I2510 Atherosclerotic heart disease of native coronary artery without angina pectoris: Secondary | ICD-10-CM | POA: Diagnosis present

## 2019-09-11 DIAGNOSIS — Z4682 Encounter for fitting and adjustment of non-vascular catheter: Secondary | ICD-10-CM

## 2019-09-11 DIAGNOSIS — Z7982 Long term (current) use of aspirin: Secondary | ICD-10-CM

## 2019-09-11 DIAGNOSIS — Z902 Acquired absence of lung [part of]: Secondary | ICD-10-CM

## 2019-09-11 DIAGNOSIS — M479 Spondylosis, unspecified: Secondary | ICD-10-CM | POA: Diagnosis present

## 2019-09-11 DIAGNOSIS — R911 Solitary pulmonary nodule: Secondary | ICD-10-CM | POA: Diagnosis present

## 2019-09-11 DIAGNOSIS — Z938 Other artificial opening status: Secondary | ICD-10-CM

## 2019-09-11 DIAGNOSIS — E039 Hypothyroidism, unspecified: Secondary | ICD-10-CM | POA: Diagnosis not present

## 2019-09-11 DIAGNOSIS — J939 Pneumothorax, unspecified: Secondary | ICD-10-CM

## 2019-09-11 DIAGNOSIS — K5730 Diverticulosis of large intestine without perforation or abscess without bleeding: Secondary | ICD-10-CM | POA: Diagnosis present

## 2019-09-11 DIAGNOSIS — T797XXA Traumatic subcutaneous emphysema, initial encounter: Secondary | ICD-10-CM | POA: Diagnosis not present

## 2019-09-11 DIAGNOSIS — Z8701 Personal history of pneumonia (recurrent): Secondary | ICD-10-CM

## 2019-09-11 DIAGNOSIS — R590 Localized enlarged lymph nodes: Secondary | ICD-10-CM | POA: Diagnosis present

## 2019-09-11 DIAGNOSIS — Z905 Acquired absence of kidney: Secondary | ICD-10-CM

## 2019-09-11 DIAGNOSIS — Z79899 Other long term (current) drug therapy: Secondary | ICD-10-CM

## 2019-09-11 DIAGNOSIS — Z79890 Hormone replacement therapy (postmenopausal): Secondary | ICD-10-CM

## 2019-09-11 DIAGNOSIS — K8020 Calculus of gallbladder without cholecystitis without obstruction: Secondary | ICD-10-CM | POA: Diagnosis present

## 2019-09-11 DIAGNOSIS — E0500 Thyrotoxicosis with diffuse goiter without thyrotoxic crisis or storm: Secondary | ICD-10-CM | POA: Diagnosis present

## 2019-09-11 HISTORY — PX: THORACOTOMY/LOBECTOMY: SHX6116

## 2019-09-11 HISTORY — PX: NODE DISSECTION: SHX5269

## 2019-09-11 LAB — POCT I-STAT 7, (LYTES, BLD GAS, ICA,H+H)
Acid-base deficit: 4 mmol/L — ABNORMAL HIGH (ref 0.0–2.0)
Bicarbonate: 26.4 mmol/L (ref 20.0–28.0)
Calcium, Ion: 1.21 mmol/L (ref 1.15–1.40)
HCT: 34 % — ABNORMAL LOW (ref 36.0–46.0)
Hemoglobin: 11.6 g/dL — ABNORMAL LOW (ref 12.0–15.0)
O2 Saturation: 94 %
Patient temperature: 36.5
Potassium: 4 mmol/L (ref 3.5–5.1)
Sodium: 140 mmol/L (ref 135–145)
TCO2: 29 mmol/L (ref 22–32)
pCO2 arterial: 75 mmHg (ref 32.0–48.0)
pH, Arterial: 7.152 — CL (ref 7.350–7.450)
pO2, Arterial: 92 mmHg (ref 83.0–108.0)

## 2019-09-11 LAB — GLUCOSE, CAPILLARY: Glucose-Capillary: 151 mg/dL — ABNORMAL HIGH (ref 70–99)

## 2019-09-11 LAB — PREPARE RBC (CROSSMATCH)

## 2019-09-11 SURGERY — LOBECTOMY, LUNG, ROBOT-ASSISTED, USING VATS
Anesthesia: General | Site: Chest | Laterality: Left

## 2019-09-11 MED ORDER — ADULT MULTIVITAMIN W/MINERALS CH
1.0000 | ORAL_TABLET | Freq: Every day | ORAL | Status: DC
  Administered 2019-09-13 – 2019-09-22 (×10): 1 via ORAL
  Filled 2019-09-11 (×10): qty 1

## 2019-09-11 MED ORDER — CHLORHEXIDINE GLUCONATE CLOTH 2 % EX PADS
6.0000 | MEDICATED_PAD | Freq: Every day | CUTANEOUS | Status: DC
  Administered 2019-09-13: 09:00:00 6 via TOPICAL

## 2019-09-11 MED ORDER — ACETAMINOPHEN 10 MG/ML IV SOLN
INTRAVENOUS | Status: AC
  Filled 2019-09-11: qty 100

## 2019-09-11 MED ORDER — ACETAMINOPHEN 160 MG/5ML PO SOLN: 1000.0000 mg | Freq: Four times a day (QID) | ORAL | Status: AC

## 2019-09-11 MED ORDER — DEXAMETHASONE SODIUM PHOSPHATE 10 MG/ML IJ SOLN
INTRAMUSCULAR | Status: AC
  Filled 2019-09-11: qty 1

## 2019-09-11 MED ORDER — ROCURONIUM BROMIDE 10 MG/ML (PF) SYRINGE
PREFILLED_SYRINGE | INTRAVENOUS | Status: AC
  Filled 2019-09-11: qty 10

## 2019-09-11 MED ORDER — NALOXONE HCL 0.4 MG/ML IJ SOLN: 0.4000 mg | INTRAMUSCULAR | Status: DC | PRN

## 2019-09-11 MED ORDER — CEFAZOLIN SODIUM-DEXTROSE 2-4 GM/100ML-% IV SOLN
2.0000 g | INTRAVENOUS | Status: AC
  Administered 2019-09-11: 2 g via INTRAVENOUS
  Filled 2019-09-11: qty 100

## 2019-09-11 MED ORDER — INSULIN ASPART 100 UNIT/ML ~~LOC~~ SOLN
0.0000 [IU] | Freq: Four times a day (QID) | SUBCUTANEOUS | Status: DC
  Administered 2019-09-12: 2 [IU] via SUBCUTANEOUS

## 2019-09-11 MED ORDER — DIPHENHYDRAMINE HCL 50 MG/ML IJ SOLN: 12.5000 mg | Freq: Four times a day (QID) | INTRAMUSCULAR | Status: DC | PRN

## 2019-09-11 MED ORDER — FENTANYL CITRATE (PF) 100 MCG/2ML IJ SOLN: 25.0000 ug | INTRAMUSCULAR | Status: DC | PRN

## 2019-09-11 MED ORDER — 0.9 % SODIUM CHLORIDE (POUR BTL) OPTIME
TOPICAL | Status: DC | PRN
  Administered 2019-09-11 (×2): 1000 mL

## 2019-09-11 MED ORDER — ENOXAPARIN SODIUM 40 MG/0.4ML ~~LOC~~ SOLN
40.0000 mg | SUBCUTANEOUS | Status: DC
  Administered 2019-09-12 – 2019-09-21 (×10): 40 mg via SUBCUTANEOUS
  Filled 2019-09-11 (×10): qty 0.4

## 2019-09-11 MED ORDER — FENTANYL CITRATE (PF) 100 MCG/2ML IJ SOLN
INTRAMUSCULAR | Status: AC
  Administered 2019-09-11: 12:00:00 100 ug via INTRAVENOUS
  Filled 2019-09-11: qty 2

## 2019-09-11 MED ORDER — ONDANSETRON HCL 4 MG/2ML IJ SOLN
INTRAMUSCULAR | Status: AC
  Filled 2019-09-11: qty 2

## 2019-09-11 MED ORDER — ESMOLOL HCL 100 MG/10ML IV SOLN
INTRAVENOUS | Status: AC
  Filled 2019-09-11: qty 10

## 2019-09-11 MED ORDER — SENNOSIDES-DOCUSATE SODIUM 8.6-50 MG PO TABS
1.0000 | ORAL_TABLET | Freq: Every day | ORAL | Status: DC
  Administered 2019-09-12 – 2019-09-21 (×9): 1 via ORAL
  Filled 2019-09-11 (×10): qty 1

## 2019-09-11 MED ORDER — MIDAZOLAM HCL 2 MG/2ML IJ SOLN
INTRAMUSCULAR | Status: AC
  Administered 2019-09-11: 12:00:00 1 mg via INTRAVENOUS
  Filled 2019-09-11: qty 2

## 2019-09-11 MED ORDER — LEVOTHYROXINE SODIUM 100 MCG PO TABS
100.0000 ug | ORAL_TABLET | Freq: Every day | ORAL | Status: DC
  Administered 2019-09-12 – 2019-09-21 (×9): 100 ug via ORAL
  Filled 2019-09-11 (×9): qty 1

## 2019-09-11 MED ORDER — DEXAMETHASONE SODIUM PHOSPHATE 10 MG/ML IJ SOLN
INTRAMUSCULAR | Status: DC | PRN
  Administered 2019-09-11: 10 mg via INTRAVENOUS

## 2019-09-11 MED ORDER — CEFAZOLIN SODIUM-DEXTROSE 2-4 GM/100ML-% IV SOLN
2.0000 g | Freq: Three times a day (TID) | INTRAVENOUS | Status: AC
  Administered 2019-09-11 – 2019-09-12 (×2): 2 g via INTRAVENOUS
  Filled 2019-09-11 (×2): qty 100

## 2019-09-11 MED ORDER — SIMVASTATIN 20 MG PO TABS
20.0000 mg | ORAL_TABLET | Freq: Every day | ORAL | Status: DC
  Administered 2019-09-12 – 2019-09-21 (×10): 20 mg via ORAL
  Filled 2019-09-11 (×10): qty 1

## 2019-09-11 MED ORDER — OXYCODONE HCL 5 MG PO TABS: 5.0000 mg | ORAL_TABLET | ORAL | Status: DC | PRN

## 2019-09-11 MED ORDER — ESMOLOL HCL 100 MG/10ML IV SOLN
INTRAVENOUS | Status: DC | PRN
  Administered 2019-09-11: 10 mg via INTRAVENOUS

## 2019-09-11 MED ORDER — PHENYLEPHRINE 40 MCG/ML (10ML) SYRINGE FOR IV PUSH (FOR BLOOD PRESSURE SUPPORT)
PREFILLED_SYRINGE | INTRAVENOUS | Status: DC | PRN
  Administered 2019-09-11 (×5): 80 ug via INTRAVENOUS

## 2019-09-11 MED ORDER — BISACODYL 5 MG PO TBEC
10.0000 mg | DELAYED_RELEASE_TABLET | Freq: Every day | ORAL | Status: DC
  Administered 2019-09-12 – 2019-09-22 (×9): 10 mg via ORAL
  Filled 2019-09-11 (×10): qty 2

## 2019-09-11 MED ORDER — ROCURONIUM BROMIDE 10 MG/ML (PF) SYRINGE
PREFILLED_SYRINGE | INTRAVENOUS | Status: DC | PRN
  Administered 2019-09-11: 30 mg via INTRAVENOUS
  Administered 2019-09-11 (×2): 50 mg via INTRAVENOUS

## 2019-09-11 MED ORDER — FENTANYL CITRATE (PF) 250 MCG/5ML IJ SOLN
INTRAMUSCULAR | Status: AC
  Filled 2019-09-11: qty 5

## 2019-09-11 MED ORDER — ACETAMINOPHEN 500 MG PO TABS
1000.0000 mg | ORAL_TABLET | Freq: Four times a day (QID) | ORAL | Status: AC
  Administered 2019-09-12 – 2019-09-16 (×8): 1000 mg via ORAL
  Filled 2019-09-11 (×8): qty 2

## 2019-09-11 MED ORDER — BUPIVACAINE HCL (PF) 0.5 % IJ SOLN
INTRAMUSCULAR | Status: AC
  Filled 2019-09-11: qty 30

## 2019-09-11 MED ORDER — TRAMADOL HCL 50 MG PO TABS
50.0000 mg | ORAL_TABLET | Freq: Four times a day (QID) | ORAL | Status: DC | PRN
  Administered 2019-09-17 (×3): 50 mg via ORAL
  Filled 2019-09-11 (×3): qty 1

## 2019-09-11 MED ORDER — SODIUM CHLORIDE 0.9 % IV SOLN: INTRAVENOUS | Status: DC | PRN

## 2019-09-11 MED ORDER — ALBUMIN HUMAN 5 % IV SOLN: INTRAVENOUS | Status: DC | PRN

## 2019-09-11 MED ORDER — ONDANSETRON HCL 4 MG/2ML IJ SOLN
4.0000 mg | Freq: Four times a day (QID) | INTRAMUSCULAR | Status: DC | PRN
  Administered 2019-09-12 – 2019-09-14 (×3): 4 mg via INTRAVENOUS
  Filled 2019-09-11 (×4): qty 2

## 2019-09-11 MED ORDER — POTASSIUM CHLORIDE IN NACL 20-0.9 MEQ/L-% IV SOLN
INTRAVENOUS | Status: DC
  Filled 2019-09-11 (×3): qty 1000

## 2019-09-11 MED ORDER — HEMOSTATIC AGENTS (NO CHARGE) OPTIME
TOPICAL | Status: DC | PRN
  Administered 2019-09-11: 1 via TOPICAL
  Administered 2019-09-11: 3 via TOPICAL
  Administered 2019-09-11 (×3): 1 via TOPICAL

## 2019-09-11 MED ORDER — MIDAZOLAM HCL 2 MG/2ML IJ SOLN: 1.0000 mg | Freq: Once | INTRAMUSCULAR | Status: AC

## 2019-09-11 MED ORDER — LIDOCAINE 2% (20 MG/ML) 5 ML SYRINGE
INTRAMUSCULAR | Status: AC
  Filled 2019-09-11: qty 5

## 2019-09-11 MED ORDER — PROPOFOL 10 MG/ML IV BOLUS
INTRAVENOUS | Status: DC | PRN
  Administered 2019-09-11: 150 mg via INTRAVENOUS
  Administered 2019-09-11: 50 mg via INTRAVENOUS

## 2019-09-11 MED ORDER — SODIUM CHLORIDE 0.9% FLUSH: 9.0000 mL | INTRAVENOUS | Status: DC | PRN

## 2019-09-11 MED ORDER — BUPIVACAINE LIPOSOME 1.3 % IJ SUSP
20.0000 mL | INTRAMUSCULAR | Status: DC
  Filled 2019-09-11: qty 20

## 2019-09-11 MED ORDER — SODIUM CHLORIDE 0.9 % IR SOLN
Status: DC | PRN
  Administered 2019-09-11: 1000 mL

## 2019-09-11 MED ORDER — LACTATED RINGERS IV SOLN: INTRAVENOUS | Status: DC

## 2019-09-11 MED ORDER — PHENYLEPHRINE HCL-NACL 10-0.9 MG/250ML-% IV SOLN
INTRAVENOUS | Status: DC | PRN
  Administered 2019-09-11: 10 ug/min via INTRAVENOUS

## 2019-09-11 MED ORDER — SODIUM CHLORIDE 0.9 % IV SOLN: 10.0000 mL/h | Freq: Once | INTRAVENOUS | Status: DC

## 2019-09-11 MED ORDER — ACETAMINOPHEN 10 MG/ML IV SOLN
INTRAVENOUS | Status: DC | PRN
  Administered 2019-09-11: 1000 mg via INTRAVENOUS

## 2019-09-11 MED ORDER — ASPIRIN 325 MG PO TABS
325.0000 mg | ORAL_TABLET | Freq: Every day | ORAL | Status: DC
  Administered 2019-09-13 – 2019-09-22 (×10): 325 mg via ORAL
  Filled 2019-09-11 (×10): qty 1

## 2019-09-11 MED ORDER — SUGAMMADEX SODIUM 200 MG/2ML IV SOLN
INTRAVENOUS | Status: DC | PRN
  Administered 2019-09-11: 200 mg via INTRAVENOUS

## 2019-09-11 MED ORDER — FENTANYL CITRATE (PF) 250 MCG/5ML IJ SOLN
INTRAMUSCULAR | Status: DC | PRN
  Administered 2019-09-11 (×2): 100 ug via INTRAVENOUS

## 2019-09-11 MED ORDER — DIPHENHYDRAMINE HCL 12.5 MG/5ML PO ELIX: 12.5000 mg | ORAL_SOLUTION | Freq: Four times a day (QID) | ORAL | Status: DC | PRN

## 2019-09-11 MED ORDER — ONDANSETRON HCL 4 MG/2ML IJ SOLN
INTRAMUSCULAR | Status: DC | PRN
  Administered 2019-09-11: 4 mg via INTRAVENOUS

## 2019-09-11 MED ORDER — PHENYLEPHRINE 40 MCG/ML (10ML) SYRINGE FOR IV PUSH (FOR BLOOD PRESSURE SUPPORT)
PREFILLED_SYRINGE | INTRAVENOUS | Status: AC
  Filled 2019-09-11: qty 10

## 2019-09-11 MED ORDER — LACTATED RINGERS IV SOLN: INTRAVENOUS | Status: DC | PRN

## 2019-09-11 MED ORDER — FENTANYL CITRATE (PF) 100 MCG/2ML IJ SOLN: 100.0000 ug | Freq: Once | INTRAMUSCULAR | Status: AC

## 2019-09-11 MED ORDER — LIDOCAINE 2% (20 MG/ML) 5 ML SYRINGE
INTRAMUSCULAR | Status: DC | PRN
  Administered 2019-09-11: 80 mg via INTRAVENOUS

## 2019-09-11 MED ORDER — PROPOFOL 10 MG/ML IV BOLUS
INTRAVENOUS | Status: AC
  Filled 2019-09-11: qty 20

## 2019-09-11 MED ORDER — FENTANYL 40 MCG/ML IV SOLN
INTRAVENOUS | Status: DC
  Administered 2019-09-11 – 2019-09-12 (×2): 0 ug via INTRAVENOUS
  Administered 2019-09-12: 40 ug via INTRAVENOUS
  Administered 2019-09-12: 50 ug via INTRAVENOUS
  Administered 2019-09-12: 30 ug via INTRAVENOUS
  Administered 2019-09-12: 50 ug via INTRAVENOUS
  Administered 2019-09-12: 0 ug via INTRAVENOUS
  Administered 2019-09-13 (×3): 30 ug via INTRAVENOUS
  Administered 2019-09-13: 50 ug via INTRAVENOUS
  Administered 2019-09-13: 20 ug via INTRAVENOUS
  Administered 2019-09-14: 30 ug via INTRAVENOUS
  Administered 2019-09-14 (×2): 10 ug via INTRAVENOUS
  Filled 2019-09-11 (×2): qty 25

## 2019-09-11 MED ORDER — SODIUM CHLORIDE FLUSH 0.9 % IV SOLN
INTRAVENOUS | Status: DC | PRN
  Administered 2019-09-11: 100 mL

## 2019-09-11 SURGICAL SUPPLY — 151 items
ADH SKN CLS APL DERMABOND .7 (GAUZE/BANDAGES/DRESSINGS) ×1
APPLIER CLIP ROT 10 11.4 M/L (STAPLE)
APR CLP MED LRG 11.4X10 (STAPLE)
BAG SPEC RTRVL LRG 6X4 10 (ENDOMECHANICALS)
BLADE CLIPPER SURG (BLADE) ×3 IMPLANT
CANISTER SUCT 3000ML PPV (MISCELLANEOUS) ×3 IMPLANT
CANNULA REDUC XI 12-8 STAPL (CANNULA) ×2
CANNULA REDUC XI 12-8MM STAPL (CANNULA) ×2
CANNULA REDUCER 12-8 DVNC XI (CANNULA) ×2 IMPLANT
CATH THORACIC 28FR (CATHETERS) ×2 IMPLANT
CATH THORACIC 28FR RT ANG (CATHETERS) IMPLANT
CATH THORACIC 36FR (CATHETERS) IMPLANT
CATH THORACIC 36FR RT ANG (CATHETERS) IMPLANT
CLIP APPLIE ROT 10 11.4 M/L (STAPLE) IMPLANT
CLIP VESOCCLUDE MED 24/CT (CLIP) ×2 IMPLANT
CLIP VESOCCLUDE MED 6/CT (CLIP) ×2 IMPLANT
CONN ST 1/4X3/8  BEN (MISCELLANEOUS)
CONN ST 1/4X3/8 BEN (MISCELLANEOUS) IMPLANT
CONN Y 3/8X3/8X3/8  BEN (MISCELLANEOUS)
CONN Y 3/8X3/8X3/8 BEN (MISCELLANEOUS) IMPLANT
CONT SPEC 4OZ CLIKSEAL STRL BL (MISCELLANEOUS) ×54 IMPLANT
COVER SURGICAL LIGHT HANDLE (MISCELLANEOUS) IMPLANT
CUTTER ECHEON FLEX ENDO 45 340 (ENDOMECHANICALS) ×2 IMPLANT
DEFOGGER SCOPE WARMER CLEARIFY (MISCELLANEOUS) ×2 IMPLANT
DERMABOND ADVANCED (GAUZE/BANDAGES/DRESSINGS) ×2
DERMABOND ADVANCED .7 DNX12 (GAUZE/BANDAGES/DRESSINGS) IMPLANT
DRAIN CHANNEL 28F RND 3/8 FF (WOUND CARE) IMPLANT
DRAIN CHANNEL 32F RND 10.7 FF (WOUND CARE) IMPLANT
DRAPE ARM DVNC X/XI (DISPOSABLE) ×4 IMPLANT
DRAPE COLUMN DVNC XI (DISPOSABLE) ×1 IMPLANT
DRAPE CV SPLIT W-CLR ANES SCRN (DRAPES) ×3 IMPLANT
DRAPE DA VINCI XI ARM (DISPOSABLE) ×8
DRAPE DA VINCI XI COLUMN (DISPOSABLE) ×2
DRAPE ORTHO SPLIT 77X108 STRL (DRAPES) ×3
DRAPE SURG ORHT 6 SPLT 77X108 (DRAPES) ×1 IMPLANT
DRAPE WARM FLUID 44X44 (DRAPES) ×3 IMPLANT
ELECT BLADE 6.5 EXT (BLADE) ×7 IMPLANT
ELECT REM PT RETURN 9FT ADLT (ELECTROSURGICAL) ×3
ELECTRODE REM PT RTRN 9FT ADLT (ELECTROSURGICAL) ×1 IMPLANT
FELT TEFLON 1X6 (MISCELLANEOUS) ×2 IMPLANT
GAUZE SPONGE 4X4 12PLY STRL (GAUZE/BANDAGES/DRESSINGS) ×3 IMPLANT
GLOVE BIO SURGEON STRL SZ 6.5 (GLOVE) ×2 IMPLANT
GLOVE BIO SURGEON STRL SZ7 (GLOVE) ×6 IMPLANT
GLOVE BIO SURGEONS STRL SZ 6.5 (GLOVE) ×2
GLOVE BIOGEL M 6.5 STRL (GLOVE) ×2 IMPLANT
GLOVE BIOGEL PI IND STRL 6.5 (GLOVE) IMPLANT
GLOVE BIOGEL PI IND STRL 7.5 (GLOVE) IMPLANT
GLOVE BIOGEL PI INDICATOR 6.5 (GLOVE) ×4
GLOVE BIOGEL PI INDICATOR 7.5 (GLOVE) ×2
GLOVE SURG SIGNA 7.5 PF LTX (GLOVE) ×6 IMPLANT
GLOVE TRIUMPH SURG SIZE 7.5 (KITS) ×4 IMPLANT
GOWN STRL REIN 3XL XLG LVL4 (GOWN DISPOSABLE) ×2 IMPLANT
GOWN STRL REUS W/ TWL LRG LVL3 (GOWN DISPOSABLE) ×2 IMPLANT
GOWN STRL REUS W/ TWL XL LVL3 (GOWN DISPOSABLE) ×4 IMPLANT
GOWN STRL REUS W/TWL LRG LVL3 (GOWN DISPOSABLE) ×6
GOWN STRL REUS W/TWL XL LVL3 (GOWN DISPOSABLE) ×12
HEMOSTAT SURGICEL 2X14 (HEMOSTASIS) ×14 IMPLANT
IRRIGATOR SUCT 8 DISP DVNC XI (IRRIGATION / IRRIGATOR) IMPLANT
IRRIGATOR SUCTION 8MM XI DISP (IRRIGATION / IRRIGATOR) ×2
KIT BASIN OR (CUSTOM PROCEDURE TRAY) ×3 IMPLANT
KIT SUCTION CATH 14FR (SUCTIONS) IMPLANT
KIT TURNOVER KIT B (KITS) ×3 IMPLANT
LOOP VESSEL SUPERMAXI WHITE (MISCELLANEOUS) ×2 IMPLANT
NDL HYPO 25GX1X1/2 BEV (NEEDLE) ×1 IMPLANT
NDL SPNL 18GX3.5 QUINCKE PK (NEEDLE) IMPLANT
NEEDLE HYPO 25GX1X1/2 BEV (NEEDLE) ×3 IMPLANT
NEEDLE SPNL 18GX3.5 QUINCKE PK (NEEDLE) IMPLANT
NS IRRIG 1000ML POUR BTL (IV SOLUTION) ×7 IMPLANT
OBTURATOR OPTICAL STANDARD 8MM (TROCAR) ×2
OBTURATOR OPTICAL STND 8 DVNC (TROCAR) ×1
OBTURATOR OPTICALSTD 8 DVNC (TROCAR) ×1 IMPLANT
PACK CHEST (CUSTOM PROCEDURE TRAY) ×3 IMPLANT
PAD ARMBOARD 7.5X6 YLW CONV (MISCELLANEOUS) ×6 IMPLANT
PATTIES SURGICAL 3 X3 (GAUZE/BANDAGES/DRESSINGS) ×2
PATTIES SURGICAL 3X3 (GAUZE/BANDAGES/DRESSINGS) IMPLANT
PENCIL BUTTON HOLSTER BLD 10FT (ELECTRODE) ×2 IMPLANT
POUCH ENDO CATCH II 15MM (MISCELLANEOUS) ×2 IMPLANT
POUCH SPECIMEN RETRIEVAL 10MM (ENDOMECHANICALS) IMPLANT
RELOAD STAPLE 35X2.5 WHT THIN (STAPLE) IMPLANT
RELOAD STAPLE 45 2.0 GRY DVNC (STAPLE) IMPLANT
RELOAD STAPLE 45 2.5 WHT DVNC (STAPLE) IMPLANT
RELOAD STAPLE 45 3.5 BLU DVNC (STAPLE) IMPLANT
RELOAD STAPLE 45 4.1 GRN THCK (STAPLE) IMPLANT
RELOAD STAPLE 45 GOLD REG/THCK (STAPLE) IMPLANT
RELOAD STAPLER 2.5X45 WHT DVNC (STAPLE) ×2 IMPLANT
RELOAD STAPLER 3.5X45 BLU DVNC (STAPLE) ×5 IMPLANT
SCISSORS LAP 5X35 DISP (ENDOMECHANICALS) IMPLANT
SEAL CANN UNIV 5-8 DVNC XI (MISCELLANEOUS) ×1 IMPLANT
SEAL XI 5MM-8MM UNIVERSAL (MISCELLANEOUS) ×4
SEALANT PROGEL (MISCELLANEOUS) ×2 IMPLANT
SEALANT SURG COSEAL 4ML (VASCULAR PRODUCTS) IMPLANT
SEALANT SURG COSEAL 8ML (VASCULAR PRODUCTS) IMPLANT
SET TUBE SMOKE EVAC HIGH FLOW (TUBING) IMPLANT
SHEARS HARMONIC HDI 20CM (ELECTROSURGICAL) IMPLANT
SOL ANTI FOG 6CC (MISCELLANEOUS) ×1 IMPLANT
SOLUTION ANTI FOG 6CC (MISCELLANEOUS) ×4
SOLUTION ELECTROLUBE (MISCELLANEOUS) ×2 IMPLANT
SPECIMEN JAR MEDIUM (MISCELLANEOUS) IMPLANT
SPONGE INTESTINAL PEANUT (DISPOSABLE) ×4 IMPLANT
SPONGE TONSIL TAPE 1 RFD (DISPOSABLE) ×5 IMPLANT
STAPLE RELOAD 2.5MM WHITE (STAPLE) ×6 IMPLANT
STAPLE RELOAD 45 2.0 GRAY (STAPLE) ×2
STAPLE RELOAD 45 2.0 GRAY DVNC (STAPLE) ×1 IMPLANT
STAPLE RELOAD 45 GRN (STAPLE) ×2 IMPLANT
STAPLE RELOAD 45MM GOLD (STAPLE) ×6 IMPLANT
STAPLE RELOAD 45MM GREEN (STAPLE) ×6
STAPLER 45 SUREFORM CVD (STAPLE) ×2
STAPLER 45 SUREFORM CVD DVNC (STAPLE) IMPLANT
STAPLER CANNULA SEAL DVNC XI (STAPLE) IMPLANT
STAPLER CANNULA SEAL XI (STAPLE) ×4
STAPLER RELOAD 2.5X45 WHITE (STAPLE) ×4
STAPLER RELOAD 2.5X45 WHT DVNC (STAPLE) ×2
STAPLER RELOAD 3.5X45 BLU DVNC (STAPLE) ×5
STAPLER RELOAD 3.5X45 BLUE (STAPLE) ×10
STAPLER VASCULAR ECHELON 35 (CUTTER) ×2 IMPLANT
STOPCOCK 4 WAY LG BORE MALE ST (IV SETS) ×3 IMPLANT
SUT PDS AB 3-0 SH 27 (SUTURE) IMPLANT
SUT PROLENE 4 0 RB 1 (SUTURE) ×3
SUT PROLENE 4-0 RB1 .5 CRCL 36 (SUTURE) IMPLANT
SUT SILK  1 MH (SUTURE) ×6
SUT SILK 1 MH (SUTURE) ×2 IMPLANT
SUT SILK 1 TIES 10X30 (SUTURE) ×5 IMPLANT
SUT SILK 2 0 SH (SUTURE) IMPLANT
SUT SILK 2 0SH CR/8 30 (SUTURE) ×2 IMPLANT
SUT SILK 3 0 SH 30 (SUTURE) IMPLANT
SUT SILK 3 0SH CR/8 30 (SUTURE) IMPLANT
SUT VIC AB 1 CTX 36 (SUTURE)
SUT VIC AB 1 CTX36XBRD ANBCTR (SUTURE) IMPLANT
SUT VIC AB 2-0 CTX 36 (SUTURE) ×4 IMPLANT
SUT VIC AB 3-0 MH 27 (SUTURE) IMPLANT
SUT VIC AB 3-0 SH 27 (SUTURE) ×3
SUT VIC AB 3-0 SH 27X BRD (SUTURE) IMPLANT
SUT VIC AB 3-0 X1 27 (SUTURE) ×3 IMPLANT
SUT VICRYL 0 TIES 12 18 (SUTURE) ×4 IMPLANT
SUT VICRYL 0 UR6 27IN ABS (SUTURE) ×7 IMPLANT
SUT VICRYL 2 TP 1 (SUTURE) ×8 IMPLANT
SUT VICRYL 3 0 (SUTURE) ×4 IMPLANT
SYR 10ML LL (SYRINGE) ×3 IMPLANT
SYR 20ML ECCENTRIC (SYRINGE) ×2 IMPLANT
SYR 30ML LL (SYRINGE) ×3 IMPLANT
SYR 50ML LL SCALE MARK (SYRINGE) ×3 IMPLANT
SYSTEM SAHARA CHEST DRAIN ATS (WOUND CARE) ×3 IMPLANT
TAPE CLOTH 4X10 WHT NS (GAUZE/BANDAGES/DRESSINGS) ×3 IMPLANT
TIP APPLICATOR SPRAY EXTEND 16 (VASCULAR PRODUCTS) ×2 IMPLANT
TOWEL GREEN STERILE (TOWEL DISPOSABLE) ×3 IMPLANT
TOWEL GREEN STERILE FF (TOWEL DISPOSABLE) ×3 IMPLANT
TRAY FOLEY MTR SLVR 16FR STAT (SET/KITS/TRAYS/PACK) ×3 IMPLANT
TROCAR BLADELESS 12MM (ENDOMECHANICALS) ×2 IMPLANT
TROCAR XCEL BLADELESS 5X75MML (TROCAR) IMPLANT
TUBING EXTENTION W/L.L. (IV SETS) ×3 IMPLANT
WATER STERILE IRR 1000ML POUR (IV SOLUTION) ×3 IMPLANT

## 2019-09-11 NOTE — Progress Notes (Signed)
Patient groggy but follows commands, MAE x 4 and denies pain.  VSS:  SR 88, 144/59, sO2 92% on 6L simple mask.  Will continue to monitor.

## 2019-09-11 NOTE — Anesthesia Procedure Notes (Signed)
Central Venous Catheter Insertion Performed by: Belinda Block, MD, anesthesiologist Start/End1/04/2020 12:50 PM, 09/11/2019 1:10 PM Preanesthetic checklist: patient identified, IV checked, site marked, risks and benefits discussed, surgical consent, monitors and equipment checked, pre-op evaluation and timeout performed Position: Trendelenburg Lidocaine 1% used for infiltration and patient sedated Hand hygiene performed , maximum sterile barriers used  and Seldinger technique used Central line was placed.Double lumen Procedure performed using ultrasound guided technique. Ultrasound Notes:anatomy identified and image(s) printed for medical record Attempts: 1 Following insertion, line sutured and Biopatch. Patient tolerated the procedure well with no immediate complications. Additional procedure comments: Attempt on left unsuccessful with guide wire. Pt tolerated procedure well Dr. Nyoka Cowden.

## 2019-09-11 NOTE — Plan of Care (Signed)

## 2019-09-11 NOTE — Anesthesia Postprocedure Evaluation (Signed)
Anesthesia Post Note  Patient: [name]  Procedure(s) Performed: XI ROBOTIC ASSISTED THORASCOPY (Left Chest) Node Dissection (Left Chest) Thoracotomy/Lobectomy Left Upper Lobe (Left Chest)     Patient location during evaluation: PACU Anesthesia Type: General Level of consciousness: awake Pain management: pain level controlled Vital Signs Assessment: post-procedure vital signs reviewed and stable Respiratory status: spontaneous breathing, nonlabored ventilation, respiratory function stable and patient connected to face mask oxygen Cardiovascular status: blood pressure returned to baseline and stable Postop Assessment: no apparent nausea or vomiting Anesthetic complications: no    Last Vitals:  Vitals:   09/11/19 2052 09/11/19 2059  BP: 137/65   Pulse: 83   Resp: 17 17  Temp: 37.2 C   SpO2: 90% 93%    Last Pain:  Vitals:   09/11/19 2059  TempSrc:   PainSc: 0-No pain                 Amijah Timothy P Wreatha Sturgeon

## 2019-09-11 NOTE — Interval H&P Note (Signed)
History and Physical Interval Note:  09/11/2019 12:38 PM  [name]  has presented today for surgery, with the diagnosis of LUL NODULE HILAR ADENOPATHY.  The various methods of treatment have been discussed with the patient and family. After consideration of risks, benefits and other options for treatment, the patient has consented to  Procedure(s): XI ROBOTIC ASSISTED THORASCOPY-LEFT UPPER LOBECTOMY (Left) as a surgical intervention.  The patient's history has been reviewed, patient examined, no change in status, stable for surgery.  I have reviewed the patient's chart and labs.  Questions were answered to the patient's satisfaction.     [name]

## 2019-09-11 NOTE — Transfer of Care (Signed)
Immediate Anesthesia Transfer of Care Note  Patient: [name]  Procedure(s) Performed: XI ROBOTIC ASSISTED THORASCOPY (Left Chest) Node Dissection (Left Chest) Thoracotomy/Lobectomy Left Upper Lobe (Left Chest)  Patient Location: PACU  Anesthesia Type:General  Level of Consciousness: sedated, drowsy, patient cooperative and responds to stimulation  Airway & Oxygen Therapy: Patient Spontanous Breathing and Patient connected to face mask oxygen  Post-op Assessment: Report given to RN, Post -op Vital signs reviewed and stable and Patient moving all extremities X 4  Post vital signs: Reviewed and stable  Last Vitals:  Vitals Value Taken Time  BP 136/61 09/11/19 1922  Temp    Pulse 85 09/11/19 1926  Resp 17 09/11/19 1926  SpO2 93 % 09/11/19 1926  Vitals shown include unvalidated device data.  Last Pain: There were no vitals filed for this visit.       Complications: No apparent anesthesia complications

## 2019-09-11 NOTE — Anesthesia Procedure Notes (Signed)
Arterial Line Insertion Start/End1/04/2020 10:15 AM, 09/11/2019 10:20 AM Performed by: Colin Benton, CRNA, CRNA  Patient location: Pre-op. Preanesthetic checklist: patient identified, IV checked, site marked, risks and benefits discussed, surgical consent, monitors and equipment checked, pre-op evaluation, timeout performed and anesthesia consent Lidocaine 1% used for infiltration Right, radial was placed Catheter size: 20 G Hand hygiene performed , maximum sterile barriers used  and Seldinger technique used Allen's test indicative of satisfactory collateral circulation Attempts: 1 Procedure performed without using ultrasound guided technique. Following insertion, dressing applied and Biopatch. Post procedure assessment: normal and unchanged  Patient tolerated the procedure well with no immediate complications.

## 2019-09-11 NOTE — Brief Op Note (Addendum)
09/11/2019  8:05 PM  PATIENT:  [name]  79 y.o. female  PRE-OPERATIVE DIAGNOSIS:  LUL NODULE, HILAR ADENOPATHY, SUSPECTED T1N1 NON-SMALL CELL CARCINOMA  POST-OPERATIVE DIAGNOSIS:  NON-SMALL CELL CARCINOMA, CLINICAL STAGE IIA (T1, N1)  PROCEDURE:  Procedure(s): XI ROBOTIC ASSISTED THORASCOPY (Left) Node Dissection (Left) Thoracotomy/Lobectomy Left Upper Lobe (Left)  SURGEON:  Surgeon(s) and Role:    * [name], MD - Primary    * Lightfoot, Lucile Crater, MD - Assisting    * Grace Isaac, MD - Observing  PHYSICIAN ASSISTANT: Enid Cutter, PA  ASSISTANTS: -   ANESTHESIA:   general  EBL:  150 mL   BLOOD ADMINISTERED:none  DRAINS: 1 Chest Tube(s) in the left   LOCAL MEDICATIONS USED:  BUPIVICAINE   SPECIMEN:  Source of Specimen:  LUL, nodes  DISPOSITION OF SPECIMEN:  PATHOLOGY  COUNTS:  YES  TOURNIQUET:  * No tourniquets in log *  DICTATION: .Other Dictation: Dictation Number -  PLAN OF CARE: Admit to inpatient   PATIENT DISPOSITION:  PACU - hemodynamically stable.   Delay start of Pharmacological VTE agent (>24hrs) due to surgical blood loss or risk of bleeding: yes  FINDINGS- converted to Thoracotomy due to invasion of PA by hilar node. Frozen- non-small cell carcinoma, bronchial and vascular margins negative

## 2019-09-11 NOTE — Anesthesia Procedure Notes (Signed)
Procedure Name: Intubation Date/Time: 09/11/2019 1:43 PM Performed by: Renato Shin, CRNA Pre-anesthesia Checklist: Patient identified, Emergency Drugs available, Suction available and Patient being monitored Patient Re-evaluated:Patient Re-evaluated prior to induction Oxygen Delivery Method: Circle system utilized Preoxygenation: Pre-oxygenation with 100% oxygen Induction Type: IV induction Ventilation: Mask ventilation without difficulty Laryngoscope Size: Miller and 2 Grade View: Grade I Tube type: Oral Endobronchial tube: Left, Double lumen EBT, EBT position confirmed by fiberoptic bronchoscope and EBT position confirmed by auscultation and 37 Fr Number of attempts: 1 Airway Equipment and Method: Oral airway and Rigid stylet Placement Confirmation: ETT inserted through vocal cords under direct vision,  positive ETCO2 and breath sounds checked- equal and bilateral Tube secured with: Tape Dental Injury: Teeth and Oropharynx as per pre-operative assessment

## 2019-09-12 ENCOUNTER — Inpatient Hospital Stay (HOSPITAL_COMMUNITY): Payer: Medicare PPO

## 2019-09-12 LAB — BLOOD GAS, ARTERIAL
Acid-base deficit: 0.4 mmol/L (ref 0.0–2.0)
Bicarbonate: 24.1 mmol/L (ref 20.0–28.0)
FIO2: 32
O2 Saturation: 92.3 %
Patient temperature: 37
pCO2 arterial: 41.8 mmHg (ref 32.0–48.0)
pH, Arterial: 7.378 (ref 7.350–7.450)
pO2, Arterial: 64.6 mmHg — ABNORMAL LOW (ref 83.0–108.0)

## 2019-09-12 LAB — CBC
HCT: 34.4 % — ABNORMAL LOW (ref 36.0–46.0)
Hemoglobin: 11.3 g/dL — ABNORMAL LOW (ref 12.0–15.0)
MCH: 32 pg (ref 26.0–34.0)
MCHC: 32.8 g/dL (ref 30.0–36.0)
MCV: 97.5 fL (ref 80.0–100.0)
Platelets: 234 10*3/uL (ref 150–400)
RBC: 3.53 MIL/uL — ABNORMAL LOW (ref 3.87–5.11)
RDW: 13.1 % (ref 11.5–15.5)
WBC: 10.9 10*3/uL — ABNORMAL HIGH (ref 4.0–10.5)
nRBC: 0 % (ref 0.0–0.2)

## 2019-09-12 LAB — BASIC METABOLIC PANEL
Anion gap: 9 (ref 5–15)
BUN: 12 mg/dL (ref 8–23)
CO2: 24 mmol/L (ref 22–32)
Calcium: 8.2 mg/dL — ABNORMAL LOW (ref 8.9–10.3)
Chloride: 106 mmol/L (ref 98–111)
Creatinine, Ser: 0.99 mg/dL (ref 0.44–1.00)
GFR calc Af Amer: >60 mL/min (ref >60)
GFR calc non Af Amer: 55 mL/min — ABNORMAL LOW (ref >60)
Glucose, Bld: 107 mg/dL — ABNORMAL HIGH (ref 70–99)
Potassium: 3.6 mmol/L (ref 3.5–5.1)
Sodium: 139 mmol/L (ref 135–145)

## 2019-09-12 LAB — GLUCOSE, CAPILLARY: Glucose-Capillary: 104 mg/dL — ABNORMAL HIGH (ref 70–99)

## 2019-09-12 MED ORDER — METOCLOPRAMIDE HCL 5 MG/ML IJ SOLN
10.0000 mg | Freq: Four times a day (QID) | INTRAMUSCULAR | Status: AC
  Administered 2019-09-12 – 2019-09-13 (×3): 10 mg via INTRAVENOUS
  Filled 2019-09-12 (×3): qty 2

## 2019-09-12 MED ORDER — POTASSIUM CHLORIDE CRYS ER 20 MEQ PO TBCR
40.0000 meq | EXTENDED_RELEASE_TABLET | Freq: Once | ORAL | Status: AC
  Administered 2019-09-12: 19:00:00 40 meq via ORAL
  Filled 2019-09-12: qty 2

## 2019-09-12 NOTE — Progress Notes (Addendum)
      [address]       ,[place] [address]             [phone]       1 Day Post-Op Procedure(s) (LRB): XI ROBOTIC ASSISTED THORASCOPY (Left) Node Dissection (Left) Thoracotomy/Lobectomy Left Upper Lobe (Left)  Subjective: Patient with nausea and just vomited. She is somewhat somnolent but easily arousable  Objective: Vital signs in last 24 hours: Temp:  [98.1 F (36.7 C)-98.9 F (37.2 C)] 98.5 F (36.9 C) (01/09 0355) Pulse Rate:  [59-89] 85 (01/09 0355) Cardiac Rhythm: Normal sinus rhythm;Bundle branch block (01/09 0700) Resp:  [15-25] 17 (01/09 0355) BP: (119-155)/(53-99) 119/66 (01/09 0355) SpO2:  [90 %-100 %] 96 % (01/09 0355) Arterial Line BP: (145-165)/(56-65) 165/61 (01/08 2300) Weight:  [58.1 kg] 58.1 kg (01/08 0935)      Intake/Output from previous day: 01/08 0701 - 01/09 0700 In: 7088.3 [I.V.:6588.3; IV Piggyback:500] Out: 0017 [Urine:1165; Blood:150; Chest Tube:410]   Physical Exam:  Cardiovascular: RRR Pulmonary: Clear to auscultation on the right and a bit coarse on the left Abdomen: Soft, non tender, bowel sounds present. Extremities: Trace bilateral lower extremity edema. Wounds: Clean and dry.  No erythema or signs of infection. Chest Tube: to suction, no air leak  Lab Results: CBC: Recent Labs    09/09/19 1333 09/11/19 1715 09/12/19 0411  WBC 7.5  --  10.9*  HGB 14.4 11.6* 11.3*  HCT 44.6 34.0* 34.4*  PLT 298  --  234   BMET:  Recent Labs    09/09/19 1333 09/11/19 1715 09/12/19 0411  NA 138 140 139  K 3.9 4.0 3.6  CL 108  --  106  CO2 19*  --  24  GLUCOSE 90  --  107*  BUN 17  --  12  CREATININE 0.93  --  0.99  CALCIUM 9.5  --  8.2*    PT/INR:  Recent Labs    09/09/19 1333  LABPROT 12.8  INR 1.0   ABG:  INR: Will add last result for INR, ABG once components are confirmed Will add last 4 CBG results once components are confirmed  Assessment/Plan:  1. CV - SR.  2.  Pulmonary - Chest tube  with 410 cc since surgery, to suction, no air leak.On 3 liters of oxygen via . Wean as able over next few days. CXR this am appears stable. As discussed with Dr. Kipp Brood, will place chest tube to water seal soon. Encourage incentive spirometer. Await final pathology. 3. Supplement potassium 4. Expected ABL anemia-H and H this am 11.3 and 34.4 5. CBGs 151/104. No history of diabetes. Stop accu checks and SS PRN 6. Decrease IVF, remove a line and foley 7. GI-Zofran PRN, will give a few doses of Reglan. Etiology may be low dose Fentanyl PCA.  May need to stop PCA if symptoms persist. May need to make NPO if symptoms persistent. [place]  Donielle M ZimmermanPA-C 09/12/2019,9:01 AM [phone]  Agree with above No leak Will transition to WS Ambulation and pulm toilet today  Lennox Dolberry O Chelsa Stout

## 2019-09-13 ENCOUNTER — Inpatient Hospital Stay (HOSPITAL_COMMUNITY): Payer: Medicare PPO

## 2019-09-13 LAB — CBC
HCT: 40 % (ref 36.0–46.0)
Hemoglobin: 12.8 g/dL (ref 12.0–15.0)
MCH: 31.1 pg (ref 26.0–34.0)
MCHC: 32 g/dL (ref 30.0–36.0)
MCV: 97.3 fL (ref 80.0–100.0)
Platelets: 261 10*3/uL (ref 150–400)
RBC: 4.11 MIL/uL (ref 3.87–5.11)
RDW: 13.2 % (ref 11.5–15.5)
WBC: 12.1 10*3/uL — ABNORMAL HIGH (ref 4.0–10.5)
nRBC: 0 % (ref 0.0–0.2)

## 2019-09-13 LAB — COMPREHENSIVE METABOLIC PANEL
ALT: 13 U/L (ref 0–44)
AST: 30 U/L (ref 15–41)
Albumin: 3.1 g/dL — ABNORMAL LOW (ref 3.5–5.0)
Alkaline Phosphatase: 58 U/L (ref 38–126)
Anion gap: 9 (ref 5–15)
BUN: 12 mg/dL (ref 8–23)
CO2: 24 mmol/L (ref 22–32)
Calcium: 8.6 mg/dL — ABNORMAL LOW (ref 8.9–10.3)
Chloride: 105 mmol/L (ref 98–111)
Creatinine, Ser: 0.94 mg/dL (ref 0.44–1.00)
GFR calc Af Amer: >60 mL/min (ref >60)
GFR calc non Af Amer: 59 mL/min — ABNORMAL LOW (ref >60)
Glucose, Bld: 113 mg/dL — ABNORMAL HIGH (ref 70–99)
Potassium: 4.9 mmol/L (ref 3.5–5.1)
Sodium: 138 mmol/L (ref 135–145)
Total Bilirubin: 0.5 mg/dL (ref 0.3–1.2)
Total Protein: 5.7 g/dL — ABNORMAL LOW (ref 6.5–8.1)

## 2019-09-13 NOTE — Progress Notes (Addendum)
      [address]       [place] [address]             [phone]       2 Days Post-Op Procedure(s) (LRB): XI ROBOTIC ASSISTED THORASCOPY (Left) Node Dissection (Left) Thoracotomy/Lobectomy Left Upper Lobe (Left)  Subjective: Patient denies further nausea or vomiting. She is more alert this am.  Objective: Vital signs in last 24 hours: Temp:  [97.8 F (36.6 C)-98.4 F (36.9 C)] 98.3 F (36.8 C) (01/10 0823) Pulse Rate:  [78-98] 89 (01/10 0823) Cardiac Rhythm: Normal sinus rhythm (01/10 0315) Resp:  [16-26] 24 (01/10 0823) BP: (90-140)/(54-65) 126/65 (01/10 0823) SpO2:  [84 %-94 %] 92 % (01/10 0823) Arterial Line BP: (146-156)/(52-57) 146/57 (01/09 0915)      Intake/Output from previous day: 01/09 0701 - 01/10 0700 In: 668.3 [I.V.:668.3] Out: 2370 [Urine:1625; Chest Tube:745]   Physical Exam:  Cardiovascular: RRR Pulmonary: Clear to auscultation on the right and coarse on the left Abdomen: Soft, non tender, bowel sounds present. Extremities: Trace bilateral lower extremity edema. Wounds: Clean and dry.  No erythema or signs of infection. There is some serous draniange from around chest tube Chest Tube: to water seal, no air leak  Lab Results: CBC: Recent Labs    09/12/19 0411 09/13/19 0336  WBC 10.9* 12.1*  HGB 11.3* 12.8  HCT 34.4* 40.0  PLT 234 261   BMET:  Recent Labs    09/12/19 0411 09/13/19 0336  NA 139 138  K 3.6 4.9  CL 106 105  CO2 24 24  GLUCOSE 107* 113*  BUN 12 12  CREATININE 0.99 0.94  CALCIUM 8.2* 8.6*    PT/INR:  No results for input(s): LABPROT, INR in the last 72 hours. ABG:  INR: Will add last result for INR, ABG once components are confirmed Will add last 4 CBG results once components are confirmed  Assessment/Plan:  1. CV - SR.  2.  Pulmonary - Chest tube with 745 cc since surgery, to water seal, no air leak. Will leave chest tube secondary to output. On 5 liters of oxygen via [place]. Wean as able  over next few days. CXR this am appears stable.spirometer. Await final pathology. 3. GI-no nausea or vomiting since yesterday. Given Zofran PRN will and a few doses of Reglan.  4. Decrease IVF  5. Remove central line  Donielle M ZimmermanPA-C 09/13/2019,8:33 AM [phone]  Agree with above Pneumothorax slightly bigger.  Will encourage IS and pulm toilet High CT output.  Ss Will d/c PCA tomorrow  Lajuana Matte

## 2019-09-13 NOTE — Plan of Care (Signed)

## 2019-09-14 ENCOUNTER — Inpatient Hospital Stay (HOSPITAL_COMMUNITY): Payer: Medicare PPO

## 2019-09-14 LAB — SURGICAL PATHOLOGY

## 2019-09-14 MED ORDER — ONDANSETRON HCL 4 MG/2ML IJ SOLN
4.0000 mg | Freq: Four times a day (QID) | INTRAMUSCULAR | Status: DC | PRN
  Administered 2019-09-14: 4 mg via INTRAVENOUS
  Filled 2019-09-14: qty 2

## 2019-09-14 MED ORDER — OXYCODONE HCL 5 MG PO TABS: 5.0000 mg | ORAL_TABLET | ORAL | Status: DC | PRN

## 2019-09-14 NOTE — Plan of Care (Signed)
  Problem: Education: Goal: Knowledge of General Education information will improve Description: Including pain rating scale, medication(s)/side effects and non-pharmacologic comfort measures Outcome: Progressing   Problem: Health Behavior/Discharge Planning: Goal: Ability to manage health-related needs will improve Outcome: Progressing   Problem: Clinical Measurements: Goal: Respiratory complications will improve Outcome: Progressing Goal: Cardiovascular complication will be avoided Outcome: Progressing   Problem: Nutrition: Goal: Adequate nutrition will be maintained Outcome: Progressing    Pt ambulated with walker, tolerated well. Sat in chair today. C/o some nausea, eating saltine crackers and Sun Drop soda. Will continue to monitor.

## 2019-09-14 NOTE — Progress Notes (Signed)
Fentanyl PCA discontinued. 13 mL wasted with Clearnce Sorrel, RN in stericycle in Med room B. Syringe discarded in needle box.

## 2019-09-14 NOTE — Plan of Care (Signed)

## 2019-09-14 NOTE — Op Note (Signed)
NAME: [name], [name] MEDICAL RECORD [id number] ACCOUNT [id number] DATE OF BIRTH:01-07-1941 FACILITY: [facility] LOCATION: [facility] PHYSICIAN:Lofton Tyler C. Arieliz Latino, MD  OPERATIVE REPORT  DATE OF PROCEDURE:  09/11/2019  PREOPERATIVE DIAGNOSIS:  Left upper lobe nodule with hilar adenopathy, suspected T1 N1, stage IIB nonsmall cell carcinoma.  POSTOPERATIVE DIAGNOSIS:  Nonsmall cell carcinoma, left upper lobe, clinical stage IIB (T1, N1).  PROCEDURES:   1.  Xi robotic-assisted left thoracoscopy.   2.  Lymph node dissection.   3.  Nonemergent conversion to thoracotomy.   4.  Left upper lobectomy.   5.  Intercostal nerve blocks, levels 3 through 10.  SURGEON:  Modesto Charon, MD  ASSISTANT:  Melodie Bouillon, MD   SECOND ASSISTANT:  Enid Cutter, PA.  ANESTHESIA:  General.  FINDINGS:  Cancerous node at the bifurcation of multiple left upper lobe branches with invasion of the PA, necessitating elective conversion to thoracotomy.  Frozen section showed nonsmall cell carcinoma.  Bronchial and vascular margins negative.  CLINICAL NOTE:  [name] is a 79 year old woman with a history of tobacco abuse who had a CT of the chest in 02/2019 which showed a 5 mm left upper lobe lung nodule.  On followup CT on 07/08/2019, the nodule had increased in size to 1.1 cm.  There  also was a left hilar node on PET CT.  Both the nodule and hilar lymph nodes were markedly hypermetabolic.  She was offered the option of surgical resection for definitive diagnosis and treatment.  The indications, risks, benefits and alternatives were  discussed in detail with the patient.  She understood and accepted the risks and agreed to proceed.  DESCRIPTION OF PROCEDURE:  [name] was brought to the preoperative holding area on 09/11/2019.  Anesthesia established central venous access and placed an arterial blood pressure monitoring line.  She was taken to the operating room, anesthetized and  intubated with a  double-lumen endotracheal tube.  Intravenous antibiotics were administered.  A Foley catheter was placed.  Sequential compression devices were placed on the calves for DVT prophylaxis.  She was placed in a right lateral decubitus  position and the left chest was prepped and draped in the usual sterile fashion.  Single lung ventilation of the right lung was initiated and was tolerated well throughout the procedure.  A timeout was performed.  A solution containing 20 mL of liposomal bupivacaine, 30 mL of 0.5% bupivacaine and 50 mL of saline was prepared.  This was used for the intercostal nerve blocks as well as for local anesthesia at the incision sites.  An  incision was made in the 8th interspace in the midaxillary line.  An 8 mm robotic port was placed.  The thoracoscope was advanced into the chest.  Carbon dioxide was insufflated into the chest via the port per protocol.  There was good isolation of the  left lung, but it was relatively slow to deflate initially.  There was no abnormality of the visceral or parietal pleura and there was no pleural effusion.  Additional ports were placed, 1 anteriorly in the 8th interspace and 2 more posteriorly in the  8th interspace and an assistant port was placed.  This was a 12 mm port that was placed in the 10th interspace anterolaterally.  All of these ports were placed with thoracoscopic visualization.  The Xi robotic arms then were attached.  Targeting was  performed.  The instruments were inserted and the dissection was begun working from the console.  The inferior ligament was divided.  All lymph nodes that were encountered were removed and sent as separate specimens for permanent pathology.  There were  numerous lymph nodes in the pulmonary ligament and generally lymph nodes were more numerous than normal.  The pleural reflection was then divided at the hilum posteriorly up to the aortic arch.  Multiple subcarinal and paraaortic nodes were also removed.   The  dissection then was carried down to the posterior aspect of the pulmonary artery.  Near the takeoff of the posterior ascending branch of the left upper lobe, there was a firm node.  The dissection then was carried back anteriorly.  The phrenic  nerve was identified and care was taken not to use cautery in the vicinity of the phrenic nerve.  The dissection was carried up to the aortopulmonary window and additional lymph nodes were removed.  The superior pulmonary vein was dissected out.  It was  encircled and divided with the robotic vascular stapler.  The dissection then moved to the fissure.  The portion of the fissure anteriorly that was incomplete was completed with sequential firings of the robotic stapler from an anterior approach.  Blue  cartridges were used for the parenchyma.  The pulmonary artery was identified in the fissure and the lingular branches were dissected out carefully with bipolar cautery.  The lingular branches were encircled and then divided with the robotic vascular  stapler.  The remainder of the fissure then was completed.  Working along the pulmonary artery towards the posterior branch, it became evident that there was a tumor mass which arose in the midst of the origins of all the upper lobe branches,  only a part of which had been seen from the posterior approach.  It was not felt that it was safe to complete the procedure robotically and the decision was made to convert to a thoracotomy.  The scope was left in place for visualization and the ports  were left in place for passage of instruments.  A lateral thoracotomy was performed and a retractor was placed. The main pulmonary artery was dissected out in the hilum.  It was encircled and a vessel loop was placed around this in a Potts fashion for proximal control should that be necessary.  Dissection then was  carried out around the left upper lobe bronchus.  The decision was made to divide the bronchus to provide better access to  the pulmonary artery and lymph node.  An Echelon stapler with a green cartridge was placed across the left upper lobe bronchus and  closed.  A test inflation showed good aeration of the lower lobe.  The stapler was fired, transecting the bronchus.  The lung then was retracted posteriorly and the more anterior branch to the upper lobe was not involved with the lymph node.  This was  carefully dissected out, encircled and divided with a vascular stapler placed from a posterior approach.  The remainder of the branches were not able to be dissected out and divided.  The node did appear to be invading the pulmonary artery between the  origin of these branches, but it was visualized that a vascular stapler could be placed across with a clean margin without compromising the lower lobe pulmonary artery.  The vascular stapler was brought in from a posterior approach, closed and fired.   The left upper lobe was placed into an endoscopic retrieval bag, removed and sent for frozen section of the mass, as well as the bronchial and vascular margins.  The mass turned  out to be a nonsmall cell carcinoma.  The bronchial and vascular margins  were clear.  The chest was copiously irrigated with warm saline.  A test inflation to 30 cm of water revealed some mild parenchymal leakage, but no leakage from the bronchial stump.  A final inspection was made for hemostasis.  Intercostal nerve blocks  were performed from the 3rd to the 10th interspace.  Ten mL of the bupivacaine solution was injected in a subpleural plane at each level.  The remainder was injected directly into the incisions.  A 28-French chest tube was placed through the original port  incision.  The remaining port incisions were closed in standard fashion.  Since the ribs did have to be spread during the dissection, a #2 Vicryl pericostal suture was placed.  The remainder of the 4th interspace working incision was closed in standard  fashion with a #1 Vicryl fascial  suture, followed by 2-0 Vicryl subcutaneous suture and 3-0 Vicryl subcuticular suture.  The chest tube was placed to suction.  The patient was placed back in a supine position.  She then was extubated in the operating  room and taken to the postanesthetic care unit in good condition.  All sponge, needle and instrument counts were correct at the end of the procedure.  VN/NUANCE  D:09/14/2019 T:09/14/2019 JOB:[id number]

## 2019-09-14 NOTE — Progress Notes (Addendum)
3 Days Post-Op Procedure(s) (LRB): XI ROBOTIC ASSISTED THORASCOPY (Left) Node Dissection (Left) Thoracotomy/Lobectomy Left Upper Lobe (Left) Subjective: Sleepy  But awakened and responded appropriately. Continue to use PCA.   Objective: Vital signs in last 24 hours: Temp:  [97.6 F (36.4 C)-98.9 F (37.2 C)] 98.9 F (37.2 C) (01/11 0753) Pulse Rate:  [92-103] 94 (01/11 0753) Cardiac Rhythm: Normal sinus rhythm (01/11 0700) Resp:  [12-22] 17 (01/11 0807) BP: (130-149)/(70-81) 149/79 (01/11 0753) SpO2:  [91 %-97 %] 94 % (01/11 0807)     Intake/Output from previous day: 01/10 0701 - 01/11 0700 In: 1088.1 [P.O.:500; I.V.:588.1] Out: 810 [Urine:500; Chest Tube:310] Intake/Output this shift: No intake/output data recorded.  General appearance: alert, cooperative and mild distress Neurologic: intact Heart: SR. Lungs:  Breath sounds clear. CT on water seal with appropriate tidling. ? small air leak (cough is weak)  Wound: Left chest dressing is dry, CT secure.   Lab Results: Recent Labs    09/12/19 0411 09/13/19 0336  WBC 10.9* 12.1*  HGB 11.3* 12.8  HCT 34.4* 40.0  PLT 234 261   BMET:  Recent Labs    09/12/19 0411 09/13/19 0336  NA 139 138  K 3.6 4.9  CL 106 105  CO2 24 24  GLUCOSE 107* 113*  BUN 12 12  CREATININE 0.99 0.94  CALCIUM 8.2* 8.6*    PT/INR: No results for input(s): LABPROT, INR in the last 72 hours. ABG    Component Value Date/Time   PHART 7.378 09/12/2019 0340   HCO3 24.1 09/12/2019 0340   TCO2 29 09/11/2019 1715   ACIDBASEDEF 0.4 09/12/2019 0340   O2SAT 92.3 09/12/2019 0340   CBG (last 3)  Recent Labs    09/11/19 2342 09/12/19 0613  GLUCAP 151* 104*    Assessment/Plan: S/P Procedure(s) (LRB): XI ROBOTIC ASSISTED THORASCOPY (Left) Node Dissection (Left) Thoracotomy/Lobectomy Left Upper Lobe (Left)  -POD-3 robotic assisted left upper lobectomy.  ? Of small air leak, leave on water seal for now and re-check later today. Plan to  transition to oral analgesics and d/c PCA today. Advance activity and work on pulmonary hygiene.   -Expected acute blood loss anemia- Hct stable, monitor.   -Hypothyroidism- continued levothyroxine.  -DVT PPX- continue enoxaparin.     LOS: 3 days    Antony Odea, [place] [phone] 09/14/2019 Patient seen and examined, agree with above Very slow to mobilize. Emphasized importance of ambulation, coughing and deep breathing Has a small air leak will keep Ct today Dc PCA Path pending  Remo Lipps C. Roxan Hockey, MD [facility] [phone]

## 2019-09-15 ENCOUNTER — Inpatient Hospital Stay (HOSPITAL_COMMUNITY): Payer: Medicare PPO

## 2019-09-15 NOTE — Discharge Summary (Addendum)
Physician Discharge Summary  Patient ID: [name] MRN: [id number] DOB/AGE: [birth year] 79 y.o.  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  She underwent an adrenal sparing laparoscopic left nephrectomy on 03/25/2019 by Dr. Lovena Neighbours.  CT and bone scan in June 2020 were negative for metastatic disease.  A CT of the chest showed a 5 mm left upper lobe lung nodule.  A follow-up CT on 07/08/2019 showed the lung nodule increased in size to 1.1 cm.  There also was a left hilar node that was prominent but not mentioned in the report.  The PET/CT showed the left upper lobe nodule and hilar lymph node were both markedly hypermetabolic.  She continues to smoke about a pack of cigarettes a day.  She has been smoking since she was about [age] years old.  She has tried quitting but has not been successful.  She had an adverse reaction to Chantix.  She lost weight after her nephrectomy but actually has gained some weight back recently.  She complains of decreased energy since she had her surgery.  She denies any chest pain, pressure, or tightness.  She is not having any cough or wheezing.  Hospital Course:   [name] was admitted for elective same-day surgery and taken to the OR where left robotic-assisted left upper lobectomy was performed along with lymph node  sampling and intercostal nerve block. She tolerated the procedure well and was recoveed in the PACU and later transferred to [facility] progressive care. Pain was initially controlled with a PCA and later transitioned to oral analgesics. Diet and activity were advanced and well tolerated.  She had a small air leak that gradually slowed and subsided. The chest tube was removed on the 4th post-op day. Follow up CXR showed a small left PTX that had enlarged by the following day. A new pigtail pleural catheter was placed at the bedside by Dr. Roxan Hockey on 1/13 and was placed to suction resulting n resolution of the pneumothorax. On the following day, the tube was placed to water seal.  A small pneumothorax was evident 24 hours later.  There was a small air leak. The tube was left on water seal as she was tolerating this with no trouble and a small space was expected post lobectomy. The decision was made to permit discharge with the pigtail catheter in place connected to a mini PleurEvac. Home health wa arranged to assist with care and assessment of the pleural tube and PleurEvac.   Consults: None  Significant Diagnostic Studies:   CLINICAL DATA:  Kidney cancer, status post left nephrectomy, left breast cancer, status post mastectomy  EXAM: CT CHEST, ABDOMEN, AND PELVIS WITH CONTRAST  TECHNIQUE: Multidetector CT imaging of the chest, abdomen and pelvis was performed following the standard protocol during bolus administration of intravenous contrast.  CONTRAST:  115mL OMNIPAQUE IOHEXOL 300 MG/ML SOLN, additional oral enteric contrast  COMPARISON:  02/12/2019, 09/05/2018, CT chest, 04/24/2011  FINDINGS:  CT CHEST FINDINGS  Cardiovascular: No significant vascular findings. Normal heart size. No pericardial effusion.  Mediastinum/Nodes: No enlarged mediastinal, hilar, or axillary lymph nodes. Thyroid gland, trachea, and esophagus demonstrate no significant findings.  Lungs/Pleura: Mild  centrilobular and paraseptal emphysema. Significant interval enlargement of a left upper lobe pulmonary nodule, now measuring 1.1 cm, previously 6 mm when measured similarly (series 3, image 38). Other tiny pulmonary nodules are stable long-term dating back to 04/24/2011 and benign, for example in the right pulmonary apex (series 3, image 27). No pleural effusion or pneumothorax.  Musculoskeletal: No chest wall mass or suspicious bone lesions identified. Status post left mastectomy and axillary lymph node dissection.  CT ABDOMEN PELVIS FINDINGS  Hepatobiliary: No solid liver abnormality is seen. Multiple gallstones in the gallbladder. No gallbladder wall thickening, or biliary dilatation.  Pancreas: Unremarkable. No pancreatic ductal dilatation or surrounding inflammatory changes.  Spleen: Normal in size without significant abnormality.  Adrenals/Urinary Tract: Stable, non nodular soft tissue attenuation thickening of the left adrenal gland, seen on multiple prior examinations dating back to 04/24/2011. Interval postoperative findings of left nephrectomy. There is no abnormal or suspicious soft tissue at the site of left nephrectomy at postoperative baseline. Bladder is unremarkable.  Stomach/Bowel: Stomach is within normal limits. Appendix appears normal. No evidence of bowel wall thickening, distention, or inflammatory changes.  Vascular/Lymphatic: Aortic atherosclerosis. No enlarged abdominal or pelvic lymph nodes.  Reproductive: No mass or other abnormality.  Other: No abdominal wall hernia or abnormality. No abdominopelvic ascites.  Musculoskeletal: No acute or significant osseous findings.  IMPRESSION: 1. Interval postoperative findings of left nephrectomy. There is no abnormal or suspicious soft tissue at the site of left nephrectomy at postoperative baseline. Please note that tailored, multiphasic renal protocol CT is preferred for the follow-up of  known renal cell carcinoma.  2. Significant interval enlargement of a left upper lobe pulmonary nodule, now measuring 1.1 cm, previously 6 mm when measured similarly (series 3, image 38). This is highly concerning for metastasis or alternately primary lung malignancy. PET-CT may be used to assess for metabolic activity given size.  3. No other evidence of metastatic disease in the chest, abdomen, or pelvis.  4.  Status post left mastectomy and axillary lymph node dissection.  5.  Emphysema (ICD10-J43.9).  6.  Coronary artery disease.  Aortic Atherosclerosis (ICD10-I70.0).   Electronically Signed   By: Eddie Candle M.D.   On: 07/08/2019 13:09     SURGICAL PATHOLOGY  CASE: [id number]  PATIENT: [name]  Surgical Pathology Report   Clinical History: LUL Nodule Hilar Adenopathy   FINAL MICROSCOPIC DIAGNOSIS:   A. LUNG, LEFT UPPER LOBE, RESECTION:  - Squamous cell carcinoma, moderately differentiated, spanning 1.5 cm.  - Bronchial and vascular margins are negative.  - Lymphovascular invasion present.  - Metastatic carcinoma in one of two lymph nodes (1/2), see comment.  - See oncology table.   B. LYMPH NODE, LEVEL 9 #2:  - One of one lymph nodes negative for carcinoma (0/1).   C. LYMPH NODE, LEVEL 8:  - One of one lymph nodes negative for carcinoma (0/1).   D. LYMPH NODE, LEVEL 7:  - One of one lymph nodes negative for carcinoma (0/1).   E. LYMPH NODE, LEVEL 10:  - One of one lymph nodes negative for carcinoma (0/1).   F. LYMPH NODE, LEVEL 10 #2:  - One of one lymph nodes negative for carcinoma (0/1).   G. LYMPH NODE, LEVEL 10 #3:  - One of one  lymph nodes negative for carcinoma (0/1).   H. LYMPH NODE, LEVEL 7 #2:  - One of one lymph nodes negative for carcinoma (0/1).   I. LYMPH NODE, LEVEL 6:  - One of one lymph nodes negative for carcinoma (0/1).   J. LYMPH NODE, LEVEL 5:  - One of one lymph nodes negative for carcinoma (0/1).   K.  LYMPH NODE, LEVEL 5 #2:  - One of one lymph nodes negative for carcinoma (0/1).   L. LYMPH NODE, LEVEL 11:  - One of one lymph nodes negative for carcinoma (0/1).   M. LYMPH NODE, LEVEL 12:  - One of one lymph nodes negative for carcinoma (0/1).   N. LYMPH NODE, LEVEL 12 #2:  - One of one lymph nodes negative for carcinoma (0/1).   O. LYMPH NODE, LEVEL 10 #4:  - One of one lymph nodes negative for carcinoma (0/1).   P. LYMPH NODE, LEVEL 11 #3:  - One of one lymph nodes negative for carcinoma (0/1).   Q. LYMPH NODE, LEVEL 5 #2:  - One of one lymph nodes negative for carcinoma (0/1).   R. LYMPH NODE, LEVEL 11 #4:  - One of one lymph nodes negative for carcinoma (0/1).   S. LYMPH NODE, LEVEL 13:  - One of one lymph nodes negative for carcinoma (0/1).   T. LYMPH NODE, LEVEL 11 #5:  - One of one lymph nodes negative for carcinoma (0/1).   U. LYMPH NODE, LEVEL 10 #5:  - One of one lymph nodes negative for carcinoma (0/1).   V. LYMPH NODE, LEVEL 10 #6:  - One of one lymph nodes negative for carcinoma (0/1).   W. LYMPH NODE, LEVEL 9:  - One of one lymph nodes negative for carcinoma (0/1).   X. LYMPH NODE, LEVEL 5 #3:  - One of one lymph nodes negative for carcinoma (0/1).   ONCOLOGY TABLE:   LUNG: Resection   Procedure: Left upper lobe resection and lymph node biopsies  Specimen Laterality: Left  Tumor Site: Left upper lobe  Tumor Size: 1.5 cm    Total Tumor Size (include invasive component and nonmucinous  lepidic component): 1.5 cm  Tumor Focality: Unifocal, see comment.  Histologic Type: Squamous cell carcinoma  Visceral Pleura Invasion: Not identified  Lymphovascular Invasion: Present  Direct Invasion of Adjacent Structures: No adjacent structures present  Margins: Uninvolved by tumor  Treatment Effect: No know presurgical therapy  Regional Lymph Nodes:    Number of Lymph Nodes Involved: 1, see comment    Number of Lymph Nodes Examined: 25   Pathologic Stage Classification (pTNM, AJCC 8th Edition): pT1b, pN1  Ancillary Studies: Can be performed upon request  Representative Tumor Block: A7  Comment(s): The peribronchial mass is largely well circumscribed with a  fibrous capsule and in areas a rim of lymphoid tissue. The more  peripheral mass is infiltrative and has lymphovascular invasion. Thus,  it is favored that the peribronchial mass represents a lymph node  metastasis and is staged as such. Dr. Saralyn Pilar has reviewed select  slides.   (v4.1.0.1)    INTRAOPERATIVE DIAGNOSIS:   A1. Bronchial margin: "Negative for carcinoma"  Intraoperative diagnosis rendered by Dr. Vic Ripper at 6:48 PM on  09/11/2019.  A2. Vascular margin: "Negative for carcinoma"  Intraoperative diagnosis rendered by Dr. Vic Ripper at 6:48 PM on  09/11/2019.  A3. Mass: "Non-small cell carcinoma"  Intraoperative diagnosis rendered by Dr. Vic Ripper at 6:48 PM on  09/11/2019.   GROSS DESCRIPTION:  A. Received fresh for intraoperative consult is a 22 x 13 x 6 cm, 259 g  lung lobe, clinically left upper lobe. Pleura is blue-gray, glistening,  intact. Upon sectioning, there is a 2 x 1.5 x 1.2 cm firm tan  peribronchial tumor, 0.5 cm from the bronchial and vascular margin.  Bronchial margin, vascular margin, and mass are submitted for frozen  section. There is an additional 1.5 x 1.4 x 0.8 cm firm tan mass in the  superior portion of lobe, greater than 1 cm to the pleura and 6 cm to  the peribronchial mass. Remaining cut surfaces are spongy, red-brown,  with prominent peripheral emphysema. Lobar lymph nodes are not grossly  seen. Representative sections are as follows:  Block summary:  1 = bronchial margin en face, frozen section remnant  2 = vascular  margin en face, frozen section remnant  3 = mass, frozen section admit  4-6 = parabronchial mass  7 = additional mass  8 = uninvolved parenchyma between masses   B. Received fresh is a 0.7 cm  anthracotic lymph node, submitted in  block B1.   C. Received fresh is a 1.5 cm anthracotic lymph node, bisected and  submitted in C1.   D. Received fresh is a 0.6 cm anthracotic lymph node, submitted in  block D1.   E. Received fresh is a 1 x 0.8 x 0.3 cm anthracotic lymph node  fragment, submitted in block E1.   F. Received fresh is a 1 x 1 x 0.2 cm aggregate of anthracotic lymph  node fragments, submitted in block F1.   G. Received fresh is a 1 x 0.5 x 0.2 cm aggregate of anthracotic lymph  node fragments, submitted in block G1.   H. Received fresh is a 0.8 x 0.5 x 0.2 cm anthracotic lymph node  fragment, submitted in block H1.   5. Received fresh is a 0.8 x 0.4 x 0.2 cm aggregate of anthracotic  lymph node fragments, submitted in block I1.   J. Received fresh is a 1 x 0.5 x 0.2 cm anthracotic lymph node  fragment, submitted in block J1.   K. Received fresh is a 0.3 cm anthracotic lymph node fragment,  submitted in block K1.   L. Received fresh is a 0.5 cm anthracotic lymph node fragment,  submitted in block L1.   M. Received fresh is a 0.5 x 0.3 x 0.1 cm aggregate of anthracotic  lymph node fragments, submitted in block M1.   N. Received fresh is a 1.5 x 0.5 x 0.3 cm anthracotic lymph node  fragment, bisected and submitted in 1 cassette.   O. Received fresh is a 0.7 cm anthracotic lymph node fragment,  submitted in block O1.   P. Received fresh is a 0.6 cm anthracotic lymph node fragment,  submitted in block P1.   Q. Received fresh is a 1.1 x 0.8 x 0.3 cm aggregate of anthracotic  lymph node fragments, submitted in block Q1.   R. Received fresh is a 1 x 1 x 0.3 cm aggregate of anthracotic lymph  node fragments, submitted in blocks R1.   S. Received fresh is a 0.5 x 0.3 x 0.1 cm aggregate of anthracotic  lymph node fragments, submitted in block S1.   T. Received fresh is a 0.7 cm anthracotic lymph node fragment,  submitted in block T1.    U. Received fresh is a 0.5 x 0.5 x 0.2 cm aggregate of anthracotic  lymph node fragments, submitted in block U1.  V. Received fresh is a 1 x 1 x 0.2 cm aggregate of anthracotic lymph  node fragments, submitted in block V1.   W. Received fresh is a 0.5 cm anthracotic lymph node fragment,  submitted in block W1.   X. Received fresh is a 2.5 cm anthracotic lymph node fragment,  sectioned and submitted in X1. (AK 09/12/2019)     Final Diagnosis performed by Vicente Males, MD.  Electronically signed  09/14/2019  Technical component performed at [employer]. [facility], [place] [address], [place], [place] [address].  Professional component performed at [facility]  [place] [address], [place], [place] [address].  Immunohistochemistry Technical component (if applicable) was performed  at [facility] [address] [place],  [place], [place] [address].  IMMUNOHISTOCHEMISTRY DISCLAIMER (if applicable):  Some of these immunohistochemical stains may have been developed and the  performance characteristics determine by [facility]. Some  may not have been cleared or approved by the U.S. Food and Drug  Administration. The FDA has determined that such clearance or approval  is not necessary. This test is used for clinical purposes. It should not  be regarded as investigational or for research. This laboratory is  certified under the [place]  (CLIA-88) as qualified to perform high complexity clinical laboratory  testing. The controls stained appropriately.   Treatments:   OPERATIVE REPORT  DATE OF PROCEDURE:  09/11/2019  PREOPERATIVE DIAGNOSIS:  Left upper lobe nodule with hilar adenopathy, suspected T1 N1, stage IIA nonsmall cell carcinoma.  POSTOPERATIVE DIAGNOSIS:  Nonsmall cell carcinoma, left upper lobe, clinical stage IIA (T1, N1).  PROCEDURES:   1.  Xi robotic-assisted  left thoracoscopy.   2.  Lymph node dissection.   3.  Nonemergent conversion to thoracotomy.   4.  Left upper lobectomy.   5.  Intercostal nerve blocks, levels 3 through 10.  SURGEON:  Modesto Charon, MD  ASSISTANT:  Melodie Bouillon, MD   SECOND ASSISTANT:  Enid Cutter, PA.  ANESTHESIA:  General.  FINDINGS:  A node at the bifurcation of multiple left upper lobe branches with invasion of the PA, necessitating elective conversion to thoracotomy.  Frozen section showed nonsmall cell carcinoma.  Bronchial and vascular margins negative.  CLINICAL NOTE:  The patient is a 79 year old woman with a history of tobacco abuse who had a CT of the chest in 02/2019 which showed a 5 mm left upper lobe lung nodule.  On followup CT on 07/08/2019, the nodule had increased in size to 1.1 cm.  There  also was a left hilar node on PET CT.  Both the nodule and hilar lymph nodes were markedly hypermetabolic.  She was offered the option of surgical resection for definitive diagnosis and treatment.  The indications, risks, benefits and alternatives were  discussed in detail with the patient.  She understood and accepted the risks and agreed to proceed.    Discharge Exam: Blood pressure 101/61, pulse 89, temperature 97.8 F (36.6 C), temperature source Oral, resp. rate 16, height 5\' 5"  (1.651 m), weight 58.1 kg, SpO2 94 %.  Physical Exam: General appearance:alert, cooperative andnodistress Neurologic:intact Heart:SR. Lungs:Breath sounds clear.Pigtail catheter is secure, onwater seal since yesterday mornint. Small air leak only seen with cough. CXR showsslight increase in left PTX to ~30%. She is breathing comfortably.   Disposition:    Allergies as of 09/22/2019      Reactions   Chantix [varenicline]    depression  Medication List    TAKE these medications   aspirin 325 MG tablet Take 1 tablet (325 mg total) by mouth every 6 (six) hours as needed for moderate pain.  Resume in 1 week What changed:   when to take this  additional instructions   Centrum Silver Ultra Womens Tabs Take 1 tablet by mouth daily.   hydroxypropyl methylcellulose / hypromellose 2.5 % ophthalmic solution Commonly known as: ISOPTO TEARS / GONIOVISC Place 1 drop into both eyes 3 (three) times daily as needed for dry eyes.   levothyroxine 100 MCG tablet Commonly known as: SYNTHROID Take 100 mcg by mouth daily before breakfast.   meclizine 50 MG tablet Commonly known as: ANTIVERT Take 25 mg by mouth 3 (three) times daily as needed for dizziness.   simvastatin 20 MG tablet Commonly known as: ZOCOR Take 20 mg by mouth at bedtime.   traMADol 50 MG tablet Commonly known as: ULTRAM Take 1 tablet (50 mg total) by mouth every 6 (six) hours as needed for up to 5 days (mild pain).      Follow-up Information    [name], MD. Go on 09/29/2019.   Specialty: Cardiothoracic Surgery Why: You have an appointment with Dr. Roxan Hockey on Tuesday 09/29/19 at 4:30pm.  Please arrive 30 minutes early for a chest x-ray to be performed by [facility] located on the first floor of the same building.  Contact information: [address] [place] [address] [phone]           Signed: Antony Odea, PA-C 09/22/2019, 9:13 AM

## 2019-09-15 NOTE — Progress Notes (Addendum)
rec'd call from radiology post chest tube removal. Small left pneumothorax has increased in size. Paged Macarthur Critchley PA regarding results. Will continue to monitor.

## 2019-09-15 NOTE — Plan of Care (Signed)
  Problem: Health Behavior/Discharge Planning: Goal: Ability to manage health-related needs will improve Outcome: Progressing   Problem: Clinical Measurements: Goal: Ability to maintain clinical measurements within normal limits will improve Outcome: Progressing Goal: Will remain free from infection Outcome: Progressing Goal: Diagnostic test results will improve Outcome: Progressing Goal: Respiratory complications will improve Outcome: Progressing Goal: Cardiovascular complication will be avoided Outcome: Progressing   Problem: Activity: Goal: Risk for activity intolerance will decrease Outcome: Progressing   Problem: Coping: Goal: Level of anxiety will decrease Outcome: Progressing   Problem: Elimination: Goal: Will not experience complications related to bowel motility Outcome: Progressing Goal: Will not experience complications related to urinary retention Outcome: Progressing   Problem: Pain Managment: Goal: General experience of comfort will improve Outcome: Progressing   Problem: Safety: Goal: Ability to remain free from injury will improve Outcome: Progressing   Problem: Skin Integrity: Goal: Risk for impaired skin integrity will decrease Outcome: Progressing   Problem: Education: Goal: Knowledge of the prescribed therapeutic regimen will improve Outcome: Progressing   Problem: Activity: Goal: Risk for activity intolerance will decrease Outcome: Progressing   Problem: Cardiac: Goal: Will achieve and/or maintain hemodynamic stability Outcome: Progressing   Problem: Clinical Measurements: Goal: Postoperative complications will be avoided or minimized Outcome: Progressing   Problem: Respiratory: Goal: Respiratory status will improve Outcome: Progressing   Problem: Pain Management: Goal: Pain level will decrease Outcome: Progressing   Problem: Skin Integrity: Goal: Wound healing without signs and symptoms infection will improve Outcome:  Progressing

## 2019-09-15 NOTE — Progress Notes (Addendum)
4 Days Post-Op Procedure(s) (LRB): XI ROBOTIC ASSISTED THORASCOPY (Left) Node Dissection (Left) Thoracotomy/Lobectomy Left Upper Lobe (Left) Subjective: Awakened easily with appropriate mental status. Denies pain. PCA and central line d/c'd yesterday.   Objective: Vital signs in last 24 hours: Temp:  [97.7 F (36.5 C)-98.9 F (37.2 C)] 98.3 F (36.8 C) (01/12 0708) Pulse Rate:  [77-107] 77 (01/12 0708) Cardiac Rhythm: Normal sinus rhythm (01/12 0708) Resp:  [17-27] 21 (01/12 0708) BP: (108-149)/(60-85) 108/60 (01/12 0708) SpO2:  [90 %-94 %] 92 % (01/12 0708)    Intake/Output from previous day: 01/11 0701 - 01/12 0700 In: -  Out: 340 [Chest Tube:340] Intake/Output this shift: Total I/O In: -  Out: 30 [Chest Tube:30]  Physical Exam: General appearance: alert, cooperative and no distress Neurologic: intact Heart: SR. Lungs:  Breath sounds clear on, coarse crackles on left. CT on water seal small air leak with cough only. Wound: Left chest dressing is dry, CT secure. Mild SubQ air adjacent to the thoracotomy incision.   Lab Results:   Recent Labs    09/13/19 0336  WBC 12.1*  HGB 12.8  HCT 40.0  PLT 261   BMET:  Recent Labs    09/13/19 0336  NA 138  K 4.9  CL 105  CO2 24  GLUCOSE 113*  BUN 12  CREATININE 0.94  CALCIUM 8.6*    PT/INR: No results for input(s): LABPROT, INR in the last 72 hours. ABG    Component Value Date/Time   PHART 7.378 09/12/2019 0340   HCO3 24.1 09/12/2019 0340   TCO2 29 09/11/2019 1715   ACIDBASEDEF 0.4 09/12/2019 0340   O2SAT 92.3 09/12/2019 0340   CBG (last 3)  No results for input(s): GLUCAP in the last 72 hours.  Assessment/Plan: S/P Procedure(s) (LRB): XI ROBOTIC ASSISTED THORASCOPY (Left) Node Dissection (Left) Thoracotomy/Lobectomy Left Upper Lobe (Left)  -POD-4 robotic-assisted left upper lobectomy.  Has small air leak, leave on water seal. CXR shows left lateral PTX mith minimal change from yesterday. Serous CT  drainage is tapering off.  Advance activity and needs more work on pulmonary hygiene.   -Expected acute blood loss anemia- Hct stable, monitor. Lab in AM.   -Hypothyroidism- continued levothyroxine.  -DVT PPX- continue enoxaparin.     LOS: 4 days    Antony Odea, [place] [phone] 09/15/2019 Patient seen and examined, agree with above. Looks better this AM Mostly has large tidal variation with cough. With multiple coughs one small bubble came through on first cough, but no air leak with repeated coughs- will dc chest tube Continue to mobilize Path T1N1, stage IIA  Revonda Standard. Roxan Hockey, MD [facility] [phone]

## 2019-09-16 ENCOUNTER — Inpatient Hospital Stay (HOSPITAL_COMMUNITY): Payer: Medicare PPO

## 2019-09-16 LAB — BASIC METABOLIC PANEL
Anion gap: 10 (ref 5–15)
BUN: 21 mg/dL (ref 8–23)
CO2: 25 mmol/L (ref 22–32)
Calcium: 8.5 mg/dL — ABNORMAL LOW (ref 8.9–10.3)
Chloride: 100 mmol/L (ref 98–111)
Creatinine, Ser: 0.82 mg/dL (ref 0.44–1.00)
GFR calc Af Amer: >60 mL/min (ref >60)
GFR calc non Af Amer: >60 mL/min (ref >60)
Glucose, Bld: 113 mg/dL — ABNORMAL HIGH (ref 70–99)
Potassium: 4 mmol/L (ref 3.5–5.1)
Sodium: 135 mmol/L (ref 135–145)

## 2019-09-16 LAB — CBC
HCT: 39.8 % (ref 36.0–46.0)
Hemoglobin: 13.1 g/dL (ref 12.0–15.0)
MCH: 30.8 pg (ref 26.0–34.0)
MCHC: 32.9 g/dL (ref 30.0–36.0)
MCV: 93.6 fL (ref 80.0–100.0)
Platelets: 334 10*3/uL (ref 150–400)
RBC: 4.25 MIL/uL (ref 3.87–5.11)
RDW: 12.9 % (ref 11.5–15.5)
WBC: 10.4 10*3/uL (ref 4.0–10.5)
nRBC: 0 % (ref 0.0–0.2)

## 2019-09-16 MED ORDER — MORPHINE SULFATE (PF) 2 MG/ML IV SOLN
2.0000 mg | Freq: Once | INTRAVENOUS | Status: AC
  Administered 2019-09-16: 10:00:00 2 mg via INTRAVENOUS

## 2019-09-16 MED ORDER — MORPHINE SULFATE (PF) 2 MG/ML IV SOLN
INTRAVENOUS | Status: AC
  Filled 2019-09-16: qty 1

## 2019-09-16 MED FILL — Fentanyl Citrate Preservative Free (PF) Inj 1000 MCG/20ML: INTRAMUSCULAR | Qty: 20 | Status: AC

## 2019-09-16 MED FILL — Sodium Chloride IV Soln 0.9%: INTRAVENOUS | Qty: 5 | Status: AC

## 2019-09-16 NOTE — Plan of Care (Signed)
  Problem: Clinical Measurements: Goal: Respiratory complications will improve Outcome: Progressing Goal: Cardiovascular complication will be avoided Outcome: Progressing   Problem: Activity: Goal: Risk for activity intolerance will decrease Outcome: Progressing   Problem: Nutrition: Goal: Adequate nutrition will be maintained Outcome: Progressing   Problem: Coping: Goal: Level of anxiety will decrease Outcome: Progressing   Problem: Elimination: Goal: Will not experience complications related to bowel motility Outcome: Progressing Goal: Will not experience complications related to urinary retention Outcome: Progressing   Problem: Pain Managment: Goal: General experience of comfort will improve Outcome: Progressing   Problem: Safety: Goal: Ability to remain free from injury will improve Outcome: Progressing   Problem: Skin Integrity: Goal: Risk for impaired skin integrity will decrease Outcome: Progressing   Problem: Education: Goal: Knowledge of the prescribed therapeutic regimen will improve Outcome: Progressing   Problem: Activity: Goal: Risk for activity intolerance will decrease Outcome: Progressing   Problem: Cardiac: Goal: Will achieve and/or maintain hemodynamic stability Outcome: Progressing   Problem: Clinical Measurements: Goal: Postoperative complications will be avoided or minimized Outcome: Progressing   Problem: Respiratory: Goal: Respiratory status will improve Outcome: Progressing   Problem: Pain Management: Goal: Pain level will decrease Outcome: Progressing   Problem: Skin Integrity: Goal: Wound healing without signs and symptoms infection will improve Outcome: Progressing

## 2019-09-16 NOTE — Progress Notes (Addendum)
5 Days Post-Op Procedure(s) (LRB): XI ROBOTIC ASSISTED THORASCOPY (Left) Node Dissection (Left) Thoracotomy/Lobectomy Left Upper Lobe (Left) Subjective: More awake and talkative this morning.  No new concerns, says she is not short of breath and is not having pain.  On RA since midnight with SaO2 88-94%.  Objective: Vital signs in last 24 hours: Temp:  [97.3 F (36.3 C)-98.3 F (36.8 C)] 98.1 F (36.7 C) (01/13 0705) Pulse Rate:  [81-99] 88 (01/13 0707) Cardiac Rhythm: Normal sinus rhythm (01/13 0705) Resp:  [14-25] 20 (01/13 0707) BP: (113-123)/(65-75) 120/75 (01/13 0705) SpO2:  [88 %-94 %] 90 % (01/13 0707)     Intake/Output from previous day: 01/12 0701 - 01/13 0700 In: 360 [P.O.:360] Out: 605 [Urine:575; Chest Tube:30] Intake/Output this shift: No intake/output data recorded.  Physical Exam: General appearance:alert, cooperative and no distress Neurologic:intact Heart:SR. Lungs:Breath sounds clear. Cough is not as coarse. There is more subQ air palpable in left posterior and lateral chest. The incisions are dry.  CXR this Am shows a larger PTX.    Lab Results: Recent Labs    09/16/19 0222  WBC 10.4  HGB 13.1  HCT 39.8  PLT 334   BMET:  Recent Labs    09/16/19 0222  NA 135  K 4.0  CL 100  CO2 25  GLUCOSE 113*  BUN 21  CREATININE 0.82  CALCIUM 8.5*    PT/INR: No results for input(s): LABPROT, INR in the last 72 hours. ABG    Component Value Date/Time   PHART 7.378 09/12/2019 0340   HCO3 24.1 09/12/2019 0340   TCO2 29 09/11/2019 1715   ACIDBASEDEF 0.4 09/12/2019 0340   O2SAT 92.3 09/12/2019 0340   CBG (last 3)  No results for input(s): GLUCAP in the last 72 hours.  Assessment/Plan: S/P Procedure(s) (LRB): XI ROBOTIC ASSISTED THORASCOPY (Left) Node Dissection (Left) Thoracotomy/Lobectomy Left Upper Lobe (Left)  -POD-5 robotic-assisted left upper lobectomy. CT removed yesterday and post removal CXR showed a small PTX that is larger  today with more palpable subQ air. She is tolerating this well with stable oxygenation and no significant discomfort.  Will discuss mgt with Dr. Roxan Hockey.   -Expected acute blood loss anemia-resolved.  -Hypothyroidism- continued levothyroxine.  -DVT PPX- continue enoxaparin.   LOS: 5 days    Antony Odea, [place] [phone] 09/16/2019 Patient seen and examined, agree with above CXR shows an increased pneumothorax. She is tolerating it surprisingly well, but I think it is safest to place a new chest tube.  I discussed the indications, risks, benefits with her and she agrees to proceed.  Revonda Standard Roxan Hockey, MD [facility] [phone]

## 2019-09-16 NOTE — Progress Notes (Signed)
   09/16/19 1015  Vitals  Temp 97.6 F (36.4 C)  Temp Source Oral  BP 110/72  MAP (mmHg) 80  BP Location Right Arm  BP Method Automatic  Patient Position (if appropriate) Lying  Pulse Rate 84  Pulse Rate Source Monitor  ECG Heart Rate 84  Cardiac Rhythm NSR  Resp (!) 27  Level of Consciousness  Level of Consciousness Alert  Oxygen Therapy  SpO2 92 %  O2 Device Nasal Cannula  O2 Flow Rate (L/min) 2 L/min  Pre-WUA / WUA Start  [place] Agitation Sedation Scale (RASS) 0  RASS Goal 0  Pain Assessment  Pain Scale 0-10  Pain Score 0  v.s prior to procedure. Time out done at bedside. Dr Roxan Hockey, Alfred Levins RN and Marinda Elk RN.

## 2019-09-16 NOTE — Plan of Care (Signed)
  Problem: Health Behavior/Discharge Planning: Goal: Ability to manage health-related needs will improve Outcome: Progressing   Problem: Clinical Measurements: Goal: Ability to maintain clinical measurements within normal limits will improve Outcome: Progressing Goal: Will remain free from infection Outcome: Progressing Goal: Diagnostic test results will improve Outcome: Progressing Goal: Respiratory complications will improve Outcome: Progressing Goal: Cardiovascular complication will be avoided Outcome: Progressing   Problem: Activity: Goal: Risk for activity intolerance will decrease Outcome: Progressing   Problem: Nutrition: Goal: Adequate nutrition will be maintained Outcome: Progressing   Problem: Coping: Goal: Level of anxiety will decrease Outcome: Progressing   Problem: Elimination: Goal: Will not experience complications related to bowel motility Outcome: Progressing Goal: Will not experience complications related to urinary retention Outcome: Progressing   Problem: Pain Managment: Goal: General experience of comfort will improve Outcome: Progressing   Problem: Safety: Goal: Ability to remain free from injury will improve Outcome: Progressing   Problem: Skin Integrity: Goal: Risk for impaired skin integrity will decrease Outcome: Progressing   Problem: Education: Goal: Knowledge of the prescribed therapeutic regimen will improve Outcome: Progressing   Problem: Activity: Goal: Risk for activity intolerance will decrease Outcome: Progressing   Problem: Cardiac: Goal: Will achieve and/or maintain hemodynamic stability Outcome: Progressing   Problem: Clinical Measurements: Goal: Postoperative complications will be avoided or minimized Outcome: Progressing   Problem: Respiratory: Goal: Respiratory status will improve Outcome: Progressing   Problem: Pain Management: Goal: Pain level will decrease Outcome: Progressing   Problem: Clinical  Measurements: Goal: Postoperative complications will be avoided or minimized Outcome: Progressing

## 2019-09-16 NOTE — Procedures (Signed)
Informed consent obtained  Sterile prep and drape Timeout performed Local anesthesia with 5 ml of 1% lidocaine 67F pigtail catheter placed left pleural space without difficulty Tolerated well + air leak from tube CXR pending  Remo Lipps C. Roxan Hockey, MD [facility] [phone]

## 2019-09-17 ENCOUNTER — Inpatient Hospital Stay (HOSPITAL_COMMUNITY): Payer: Medicare PPO

## 2019-09-17 LAB — TYPE AND SCREEN
ABO/RH(D): B POS
Antibody Screen: NEGATIVE
Unit division: 0
Unit division: 0
Unit division: 0
Unit division: 0

## 2019-09-17 LAB — BPAM RBC
Blood Product Expiration Date: 202102042359
Blood Product Expiration Date: 202102062359
Blood Product Expiration Date: 202102072359
Blood Product Expiration Date: 202102082359
ISSUE DATE / TIME: 202101081512
ISSUE DATE / TIME: 202101081512
Unit Type and Rh: 7300
Unit Type and Rh: 7300
Unit Type and Rh: 7300
Unit Type and Rh: 7300

## 2019-09-17 MED ORDER — LUNG SURGERY BOOK
Freq: Once | Status: AC
  Administered 2019-09-17: 1
  Filled 2019-09-17: qty 1

## 2019-09-17 NOTE — Progress Notes (Signed)
6 Days Post-Op Procedure(s) (LRB): XI ROBOTIC ASSISTED THORASCOPY (Left) Node Dissection (Left) Thoracotomy/Lobectomy Left Upper Lobe (Left) Subjective: Says she rested better last night. Sitting up eating breakfast. No new concerns.   Objective: Vital signs in last 24 hours: Temp:  [97.5 F (36.4 C)-97.8 F (36.6 C)] 97.6 F (36.4 C) (01/14 0707) Pulse Rate:  [79-91] 91 (01/14 0707) Cardiac Rhythm: Normal sinus rhythm (01/14 0707) Resp:  [16-27] 21 (01/14 0707) BP: (108-133)/(56-76) 131/75 (01/14 0707) SpO2:  [92 %-97 %] 95 % (01/14 0707)      Intake/Output from previous day: 01/13 0701 - 01/14 0700 In: 720 [P.O.:720] Out: 412 [Urine:300; Chest Tube:112] Intake/Output this shift: Total I/O In: -  Out: 50 [Chest Tube:50]  Physical Exam: General appearance:alert, cooperative andnodistress Neurologic:intact Heart:SR. Lungs:Breath sounds clear. Pigtail catheter is secure. Small air leak. No PTX on CXR.   Lab Results: Recent Labs    09/16/19 0222  WBC 10.4  HGB 13.1  HCT 39.8  PLT 334   BMET:  Recent Labs    09/16/19 0222  NA 135  K 4.0  CL 100  CO2 25  GLUCOSE 113*  BUN 21  CREATININE 0.82  CALCIUM 8.5*    PT/INR: No results for input(s): LABPROT, INR in the last 72 hours. ABG    Component Value Date/Time   PHART 7.378 09/12/2019 0340   HCO3 24.1 09/12/2019 0340   TCO2 29 09/11/2019 1715   ACIDBASEDEF 0.4 09/12/2019 0340   O2SAT 92.3 09/12/2019 0340   CBG (last 3)  No results for input(s): GLUCAP in the last 72 hours.   EXAM: PORTABLE CHEST 1 VIEW  COMPARISON:  1 day prior  FINDINGS: Midline trachea. Normal heart size. No pleural fluid. Left hemidiaphragm elevation. The left pigtail catheter has been repositioned or replaced, now terminating at the left apex. The previously described left apical pneumothorax is no longer identified. There is subcutaneous emphysema about the left chest wall, decreased.  Left hemidiaphragm  elevation. Diffuse peribronchial thickening. Numerous leads and wires project over the chest. Surgical clips over the left axilla. Improved left base airspace disease.  IMPRESSION: Left pigtail catheter in place with resolution of left sided pneumothorax.  Improved left base airspace disease, likely atelectasis.  Assessment/Plan: S/P Procedure(s) (LRB): XI ROBOTIC ASSISTED THORASCOPY (Left) Node Dissection (Left) Thoracotomy/Lobectomy Left Upper Lobe (Left)  -POD-6robotic-assisted left upper lobectomy.CT removed 1/12 and post removal serial CXR's showed an enlarging PTX. A new pigtail pleural tube was placed yesterday with resolution of the PTX. There is a small air leak. DT wo water seal today.   -Expected acute blood loss anemia-resolved.  -Hypothyroidism- continued levothyroxine.  -DVT PPX- continue enoxaparin.  LOS: 6 days    Antony Odea, PA-C [phone] 09/17/2019

## 2019-09-17 NOTE — Plan of Care (Signed)
  Problem: Clinical Measurements: Goal: Cardiovascular complication will be avoided Outcome: Progressing   Problem: Activity: Goal: Risk for activity intolerance will decrease Outcome: Progressing   Problem: Nutrition: Goal: Adequate nutrition will be maintained Outcome: Progressing   Problem: Coping: Goal: Level of anxiety will decrease Outcome: Progressing   Problem: Elimination: Goal: Will not experience complications related to bowel motility Outcome: Progressing Goal: Will not experience complications related to urinary retention Outcome: Progressing   Problem: Pain Managment: Goal: General experience of comfort will improve Outcome: Progressing   Problem: Safety: Goal: Ability to remain free from injury will improve Outcome: Progressing   Problem: Skin Integrity: Goal: Risk for impaired skin integrity will decrease Outcome: Progressing   Problem: Education: Goal: Knowledge of the prescribed therapeutic regimen will improve Outcome: Progressing   Problem: Activity: Goal: Risk for activity intolerance will decrease Outcome: Progressing   Problem: Cardiac: Goal: Will achieve and/or maintain hemodynamic stability Outcome: Progressing   Problem: Clinical Measurements: Goal: Postoperative complications will be avoided or minimized Outcome: Progressing   Problem: Pain Management: Goal: Pain level will decrease Outcome: Progressing   Problem: Skin Integrity: Goal: Wound healing without signs and symptoms infection will improve Outcome: Progressing

## 2019-09-18 ENCOUNTER — Inpatient Hospital Stay (HOSPITAL_COMMUNITY): Payer: Medicare PPO

## 2019-09-18 NOTE — Progress Notes (Addendum)
7 Days Post-Op Procedure(s) (LRB): XI ROBOTIC ASSISTED THORASCOPY (Left) Node Dissection (Left) Thoracotomy/Lobectomy Left Upper Lobe (Left) Subjective: Awake and alert, resting in bed.  Denies pain or shortness of breath.   Objective: Vital signs in last 24 hours: Temp:  [97.5 F (36.4 C)-98.5 F (36.9 C)] 98.2 F (36.8 C) (01/15 0344) Pulse Rate:  [75-88] 88 (01/15 0344) Cardiac Rhythm: Normal sinus rhythm (01/15 0345) Resp:  [16-23] 23 (01/15 0344) BP: (102-129)/(55-75) 102/59 (01/15 0344) SpO2:  [93 %-96 %] 93 % (01/15 0344)     Intake/Output from previous day: 01/14 0701 - 01/15 0700 In: 1410 [P.O.:1410] Out: 580 [Urine:500; Chest Tube:80] Intake/Output this shift: No intake/output data recorded.  Physical Exam: General appearance:alert, cooperative andnodistress Neurologic:intact Heart:SR. Lungs:Breath sounds clear. Pigtail catheter is secure, on water seal since yesterday morning. Small air leak with cough only.  CXR shows left PTX has recurred, SubQ air about the same.   Lab Results: Recent Labs    09/16/19 0222  WBC 10.4  HGB 13.1  HCT 39.8  PLT 334   BMET:  Recent Labs    09/16/19 0222  NA 135  K 4.0  CL 100  CO2 25  GLUCOSE 113*  BUN 21  CREATININE 0.82  CALCIUM 8.5*    PT/INR: No results for input(s): LABPROT, INR in the last 72 hours. ABG    Component Value Date/Time   PHART 7.378 09/12/2019 0340   HCO3 24.1 09/12/2019 0340   TCO2 29 09/11/2019 1715   ACIDBASEDEF 0.4 09/12/2019 0340   O2SAT 92.3 09/12/2019 0340   CBG (last 3)  No results for input(s): GLUCAP in the last 72 hours.  Assessment/Plan: S/P Procedure(s) (LRB): XI ROBOTIC ASSISTED THORASCOPY (Left) Node Dissection (Left) Thoracotomy/Lobectomy Left Upper Lobe (Left)  -POD-7robotic-assisted left upper lobectomy.CT removed 1/12 and post removal serial CXR's showed an enlarging PTX. A new pigtail pleural tube was placed 1/13 with resolution of the PTX. CT placed  to water seal yesterday and small PTX has recurred.  Will plan to place back to suction today.   Path: 1.5cm squamous cell carcinoma, 1 of 25 lymph nodes positive for tumor. Margins negative.    -Expected acute blood loss anemia-resolved.  -Hypothyroidism- continued levothyroxine.  -DVT PPX- continue enoxaparin.   LOS: 7 days    Antony Odea, PA-C [phone] 09/18/2019 Lung dropped a bit on water seal, there may be a "space component." She still has a leak but it may be a little smaller. Will leave on water seal. If CXR stable or improved tomorrow, consider changing to mini express (if there is still a leak), if worsened- place back to suction Otherwise is doing well  Remo Lipps C. Roxan Hockey, MD [facility] [phone]

## 2019-09-18 NOTE — Progress Notes (Signed)
RN received call from Radiology to give CXR results. RN was advise that pneumo was showing again on the new CXR and seem worse than before. Myron, PA advise of results. RN will continue to monitor.

## 2019-09-19 ENCOUNTER — Inpatient Hospital Stay (HOSPITAL_COMMUNITY): Payer: Medicare PPO

## 2019-09-19 NOTE — Plan of Care (Signed)
  Problem: Clinical Measurements: Goal: Respiratory complications will improve Outcome: Progressing Goal: Cardiovascular complication will be avoided Outcome: Progressing   Problem: Activity: Goal: Risk for activity intolerance will decrease Outcome: Progressing   Problem: Nutrition: Goal: Adequate nutrition will be maintained Outcome: Progressing   Problem: Coping: Goal: Level of anxiety will decrease Outcome: Progressing   Problem: Elimination: Goal: Will not experience complications related to bowel motility Outcome: Progressing Goal: Will not experience complications related to urinary retention Outcome: Progressing   Problem: Pain Managment: Goal: General experience of comfort will improve Outcome: Progressing   Problem: Safety: Goal: Ability to remain free from injury will improve Outcome: Progressing   Problem: Skin Integrity: Goal: Risk for impaired skin integrity will decrease Outcome: Progressing   Problem: Education: Goal: Knowledge of the prescribed therapeutic regimen will improve Outcome: Progressing   Problem: Activity: Goal: Risk for activity intolerance will decrease Outcome: Progressing   Problem: Cardiac: Goal: Will achieve and/or maintain hemodynamic stability Outcome: Progressing   Problem: Clinical Measurements: Goal: Postoperative complications will be avoided or minimized Outcome: Progressing   Problem: Respiratory: Goal: Respiratory status will improve Outcome: Progressing   Problem: Pain Management: Goal: Pain level will decrease Outcome: Progressing   Problem: Skin Integrity: Goal: Wound healing without signs and symptoms infection will improve Outcome: Progressing

## 2019-09-19 NOTE — Progress Notes (Addendum)
      [address]       [place] [address]             [phone]      8 Days Post-Op Procedure(s) (LRB): XI ROBOTIC ASSISTED THORASCOPY (Left) Node Dissection (Left) Thoracotomy/Lobectomy Left Upper Lobe (Left) Subjective: Feels okay this morning. Sleepy.   Objective: Vital signs in last 24 hours: Temp:  [97.6 F (36.4 C)-97.9 F (36.6 C)] 97.9 F (36.6 C) (01/16 0736) Pulse Rate:  [77-83] 83 (01/16 0736) Cardiac Rhythm: Normal sinus rhythm (01/16 0736) Resp:  [18-20] 20 (01/16 0736) BP: (113-131)/(66-76) 116/68 (01/16 0736) SpO2:  [94 %-95 %] 95 % (01/16 0736)     Intake/Output from previous day: 01/15 0701 - 01/16 0700 In: -  Out: 975 [Urine:975] Intake/Output this shift: No intake/output data recorded.  General appearance: alert, cooperative and no distress Heart: regular rate and rhythm, S1, S2 normal, no murmur, click, rub or gallop Lungs: clear to auscultation bilaterally Abdomen: soft, non-tender; bowel sounds normal; no masses,  no organomegaly Extremities: extremities normal, atraumatic, no cyanosis or edema Wound: clean and dry  Lab Results: No results for input(s): WBC, HGB, HCT, PLT in the last 72 hours. BMET: No results for input(s): NA, K, CL, CO2, GLUCOSE, BUN, CREATININE, CALCIUM in the last 72 hours.  PT/INR: No results for input(s): LABPROT, INR in the last 72 hours. ABG    Component Value Date/Time   PHART 7.378 09/12/2019 0340   HCO3 24.1 09/12/2019 0340   TCO2 29 09/11/2019 1715   ACIDBASEDEF 0.4 09/12/2019 0340   O2SAT 92.3 09/12/2019 0340   CBG (last 3)  No results for input(s): GLUCAP in the last 72 hours.  Assessment/Plan: S/P Procedure(s) (LRB): XI ROBOTIC ASSISTED THORASCOPY (Left) Node Dissection (Left) Thoracotomy/Lobectomy Left Upper Lobe (Left)  POD-8robotic-assisted left upper lobectomy.CXR with slightly worsened pneumothorax. Will plan to place back to 10cm suction today. CXR in the morning.    Remains hemodynamically stable and in NSR.   Continue to wean oxygen as tolerated. Encouraged incentive spirometer  Labs from 1/13 stable  Ambulate x 3 in the halls today. Patient agrees.     LOS: 8 days    Elgie Collard 09/19/2019   DG CHEST PORT 1 VIEW  Result Date: 09/19/2019 CLINICAL DATA:  Follow-up pneumothorax. EXAM: PORTABLE CHEST 1 VIEW COMPARISON:  Multiple recent prior chest x-rays. FINDINGS: The left-sided chest tube is stable. Slight interval increase in size of the left-sided pneumothorax estimated at 20%. The widest diameter of the pneumothorax is 28 mm and was previously 19 mm. Stable surgical changes involving the left lower lobe with left basilar atelectasis. Overall stable appearing subcutaneous emphysema. The right lung remains clear. IMPRESSION: Slight interval increase in size of the left-sided pneumothorax estimated at 20%. Electronically Signed   By: Marijo Sanes M.D.   On: 09/19/2019 10:00   With increased PTX, placed back on suction -10 chest tube  Ambulating in hall I have seen and examined [name] and agree with the above assessment  and plan.  Grace Isaac MD Beeper [phone] Office [phone] 09/19/2019 10:16 AM

## 2019-09-20 ENCOUNTER — Inpatient Hospital Stay (HOSPITAL_COMMUNITY): Payer: Medicare PPO

## 2019-09-20 NOTE — Progress Notes (Addendum)
      [address]       [place] [address]             [phone]      9 Days Post-Op Procedure(s) (LRB): XI ROBOTIC ASSISTED THORASCOPY (Left) Node Dissection (Left) Thoracotomy/Lobectomy Left Upper Lobe (Left) Subjective: Very sleepy this morning even with Sternal rub. Patient encouraged to ambulate in the halls and to limit pain medication.   Objective: Vital signs in last 24 hours: Temp:  [97.7 F (36.5 C)-98.8 F (37.1 C)] 97.8 F (36.6 C) (01/17 0730) Pulse Rate:  [84-98] 98 (01/17 0730) Cardiac Rhythm: Normal sinus rhythm (01/17 0730) Resp:  [17-21] 20 (01/17 0730) BP: (101-119)/(60-68) 106/67 (01/17 0730) SpO2:  [95 %-99 %] 98 % (01/17 0730)     Intake/Output from previous day: 01/16 0701 - 01/17 0700 In: 360 [P.O.:360] Out: 630 [Urine:550; Chest Tube:80] Intake/Output this shift: Total I/O In: 240 [P.O.:240] Out: 20 [Chest Tube:20]  General appearance: alert, cooperative and no distress Heart: regular rate and rhythm, S1, S2 normal, no murmur, click, rub or gallop Lungs: clear to auscultation bilaterally Abdomen: soft, non-tender; bowel sounds normal; no masses,  no organomegaly Extremities: extremities normal, atraumatic, no cyanosis or edema Wound: clean and dry  Lab Results: No results for input(s): WBC, HGB, HCT, PLT in the last 72 hours. BMET: No results for input(s): NA, K, CL, CO2, GLUCOSE, BUN, CREATININE, CALCIUM in the last 72 hours.  PT/INR: No results for input(s): LABPROT, INR in the last 72 hours. ABG    Component Value Date/Time   PHART 7.378 09/12/2019 0340   HCO3 24.1 09/12/2019 0340   TCO2 29 09/11/2019 1715   ACIDBASEDEF 0.4 09/12/2019 0340   O2SAT 92.3 09/12/2019 0340   CBG (last 3)  No results for input(s): GLUCAP in the last 72 hours.  Assessment/Plan: S/P Procedure(s) (LRB): XI ROBOTIC ASSISTED THORASCOPY (Left) Node Dissection (Left) Thoracotomy/Lobectomy Left Upper Lobe  (Left)   POD-9robotic-assisted left upper lobectomy.CXR improved this morning with decrease in small left apical pneumothorax. subcutaneous emphysema seen over left lateral chest wall and left supraclavicular region  Remains hemodynamically stable and in NSR.   Continue to wean oxygen as tolerated. Remains on 1 L [place].  Encouraged incentive spirometer  Labs from 1/13 stable, will order repeat labs for tomorrow morning.   Ambulate x 2 in the halls today. Patient agrees. Discussed with nursing.   Plan: Very sleepy this morning. Continue to encourage incentive spirometer and ambulation in the halls. Discontinue oxycodone and try to limit pain medication. Keep chest tube to 10cm of suction.    LOS: 9 days    Elgie Collard 09/20/2019  Arouses but sleepy this am, small air leak , chest xray improved on low ct suction I have seen and examined Fredric Mare and agree with the above assessment  and plan.  Grace Isaac MD Beeper [phone] Office [phone] 09/20/2019 10:22 AM

## 2019-09-20 NOTE — Plan of Care (Signed)
  Problem: Clinical Measurements: Goal: Cardiovascular complication will be avoided Outcome: Progressing   Problem: Activity: Goal: Risk for activity intolerance will decrease Outcome: Progressing   Problem: Nutrition: Goal: Adequate nutrition will be maintained Outcome: Progressing   Problem: Coping: Goal: Level of anxiety will decrease Outcome: Progressing   Problem: Elimination: Goal: Will not experience complications related to bowel motility Outcome: Progressing Goal: Will not experience complications related to urinary retention Outcome: Progressing   Problem: Pain Managment: Goal: General experience of comfort will improve Outcome: Progressing   Problem: Safety: Goal: Ability to remain free from injury will improve Outcome: Progressing   Problem: Skin Integrity: Goal: Risk for impaired skin integrity will decrease Outcome: Progressing   Problem: Education: Goal: Knowledge of the prescribed therapeutic regimen will improve Outcome: Progressing   Problem: Activity: Goal: Risk for activity intolerance will decrease Outcome: Progressing   Problem: Cardiac: Goal: Will achieve and/or maintain hemodynamic stability Outcome: Progressing   Problem: Clinical Measurements: Goal: Postoperative complications will be avoided or minimized Outcome: Progressing   Problem: Respiratory: Goal: Respiratory status will improve Outcome: Progressing   Problem: Pain Management: Goal: Pain level will decrease Outcome: Progressing   Problem: Skin Integrity: Goal: Wound healing without signs and symptoms infection will improve Outcome: Progressing

## 2019-09-21 ENCOUNTER — Inpatient Hospital Stay (HOSPITAL_COMMUNITY): Payer: Medicare PPO

## 2019-09-21 LAB — CBC
HCT: 36.7 % (ref 36.0–46.0)
Hemoglobin: 11.9 g/dL — ABNORMAL LOW (ref 12.0–15.0)
MCH: 30.8 pg (ref 26.0–34.0)
MCHC: 32.4 g/dL (ref 30.0–36.0)
MCV: 95.1 fL (ref 80.0–100.0)
Platelets: 460 10*3/uL — ABNORMAL HIGH (ref 150–400)
RBC: 3.86 MIL/uL — ABNORMAL LOW (ref 3.87–5.11)
RDW: 13 % (ref 11.5–15.5)
WBC: 10.2 10*3/uL (ref 4.0–10.5)
nRBC: 0 % (ref 0.0–0.2)

## 2019-09-21 LAB — BASIC METABOLIC PANEL
Anion gap: 8 (ref 5–15)
BUN: 26 mg/dL — ABNORMAL HIGH (ref 8–23)
CO2: 28 mmol/L (ref 22–32)
Calcium: 8.8 mg/dL — ABNORMAL LOW (ref 8.9–10.3)
Chloride: 100 mmol/L (ref 98–111)
Creatinine, Ser: 1.11 mg/dL — ABNORMAL HIGH (ref 0.44–1.00)
GFR calc Af Amer: 56 mL/min — ABNORMAL LOW (ref >60)
GFR calc non Af Amer: 48 mL/min — ABNORMAL LOW (ref >60)
Glucose, Bld: 102 mg/dL — ABNORMAL HIGH (ref 70–99)
Potassium: 4.8 mmol/L (ref 3.5–5.1)
Sodium: 136 mmol/L (ref 135–145)

## 2019-09-21 MED ORDER — MUPIROCIN 2 % EX OINT
TOPICAL_OINTMENT | CUTANEOUS | Status: AC
  Administered 2019-09-21: 1
  Filled 2019-09-21: qty 22

## 2019-09-21 MED ORDER — MUPIROCIN 2 % EX OINT: TOPICAL_OINTMENT | Freq: Two times a day (BID) | CUTANEOUS | Status: DC

## 2019-09-21 NOTE — Progress Notes (Signed)
10 Days Post-Op Procedure(s) (LRB): XI ROBOTIC ASSISTED THORASCOPY (Left) Node Dissection (Left) Thoracotomy/Lobectomy Left Upper Lobe (Left) Subjective: Sitting up in the chair, just finished breakfast. Alert and talking. Walked in the hall yesterday.   Objective: Vital signs in last 24 hours: Temp:  [97.6 F (36.4 C)-98.6 F (37 C)] 97.8 F (36.6 C) (01/18 0556) Pulse Rate:  [82-120] 83 (01/18 0556) Cardiac Rhythm: Normal sinus rhythm;Bundle branch block (01/18 0556) Resp:  [14-24] 16 (01/18 0556) BP: (90-111)/(61-67) 104/65 (01/18 0556) SpO2:  [93 %-99 %] 96 % (01/18 0556)     Intake/Output from previous day: 01/17 0701 - 01/18 0700 In: 480 [P.O.:480] Out: 910 [Urine:800; Chest Tube:110] Intake/Output this shift: No intake/output data recorded.  Physical Exam: General appearance:alert, cooperative andnodistress Neurologic:intact Heart:SR. Lungs:Breath sounds clear.Pigtail catheter is secure, on -10cmH2O suction since Saturday morning.  Small air leak.  CXR shows small lateral and apical PTX.  SubQ air about the same.    Lab Results: Recent Labs    09/21/19 0212  WBC 10.2  HGB 11.9*  HCT 36.7  PLT 460*   BMET:  Recent Labs    09/21/19 0212  NA 136  K 4.8  CL 100  CO2 28  GLUCOSE 102*  BUN 26*  CREATININE 1.11*  CALCIUM 8.8*    PT/INR: No results for input(s): LABPROT, INR in the last 72 hours. ABG    Component Value Date/Time   PHART 7.378 09/12/2019 0340   HCO3 24.1 09/12/2019 0340   TCO2 29 09/11/2019 1715   ACIDBASEDEF 0.4 09/12/2019 0340   O2SAT 92.3 09/12/2019 0340   CBG (last 3)  No results for input(s): GLUCAP in the last 72 hours.  Assessment/Plan: S/P Procedure(s) (LRB): XI ROBOTIC ASSISTED THORASCOPY (Left) Node Dissection (Left) Thoracotomy/Lobectomy Left Upper Lobe (Left)  -POD-10robotic-assisted left upper lobectomy.CT removed1/12and post removal serialCXR'sshowed an enlargingPTX. A new pigtail pleural tube  was placed 1/13 with resolution of the PTX. CT on -10cmH2O suction past 48 hours. There is an active air leak and small PTX. Will place back on water seal and monitor. A moderate space is not unexpected following lobectomy.   -Expected acute blood loss anemia-resolved.  -Hypothyroidism- continued levothyroxine.  -DVT PPX- continue enoxaparin.   LOS: 10 days    Antony Odea, PA,C [phone] 09/21/2019

## 2019-09-21 NOTE — Plan of Care (Signed)
  Problem: Clinical Measurements: Goal: Cardiovascular complication will be avoided Outcome: Progressing   Problem: Activity: Goal: Risk for activity intolerance will decrease Outcome: Progressing   Problem: Nutrition: Goal: Adequate nutrition will be maintained Outcome: Progressing   Problem: Coping: Goal: Level of anxiety will decrease Outcome: Progressing   Problem: Elimination: Goal: Will not experience complications related to bowel motility Outcome: Progressing Goal: Will not experience complications related to urinary retention Outcome: Progressing   Problem: Pain Managment: Goal: General experience of comfort will improve Outcome: Progressing   Problem: Safety: Goal: Ability to remain free from injury will improve Outcome: Progressing   Problem: Skin Integrity: Goal: Risk for impaired skin integrity will decrease Outcome: Progressing   Problem: Education: Goal: Knowledge of the prescribed therapeutic regimen will improve Outcome: Progressing   Problem: Activity: Goal: Risk for activity intolerance will decrease Outcome: Progressing   Problem: Cardiac: Goal: Will achieve and/or maintain hemodynamic stability Outcome: Progressing   Problem: Clinical Measurements: Goal: Postoperative complications will be avoided or minimized Outcome: Progressing   Problem: Respiratory: Goal: Respiratory status will improve Outcome: Progressing   Problem: Pain Management: Goal: Pain level will decrease Outcome: Progressing   Problem: Skin Integrity: Goal: Wound healing without signs and symptoms infection will improve Outcome: Progressing

## 2019-09-21 NOTE — Plan of Care (Signed)
  Problem: Activity: Goal: Risk for activity intolerance will decrease Outcome: Progressing   Problem: Clinical Measurements: Goal: Cardiovascular complication will be avoided Outcome: Progressing   Problem: Nutrition: Goal: Adequate nutrition will be maintained Outcome: Progressing   Problem: Coping: Goal: Level of anxiety will decrease Outcome: Progressing   Problem: Elimination: Goal: Will not experience complications related to urinary retention Outcome: Progressing   Problem: Pain Managment: Goal: General experience of comfort will improve Outcome: Progressing   Problem: Safety: Goal: Ability to remain free from injury will improve Outcome: Progressing   Problem: Skin Integrity: Goal: Risk for impaired skin integrity will decrease Outcome: Progressing   Problem: Activity: Goal: Risk for activity intolerance will decrease Outcome: Progressing   Problem: Cardiac: Goal: Will achieve and/or maintain hemodynamic stability Outcome: Progressing   Problem: Respiratory: Goal: Respiratory status will improve Outcome: Progressing   Problem: Pain Management: Goal: Pain level will decrease Outcome: Progressing

## 2019-09-22 ENCOUNTER — Inpatient Hospital Stay (HOSPITAL_COMMUNITY): Payer: Medicare PPO

## 2019-09-22 MED ORDER — TRAMADOL HCL 50 MG PO TABS: 50.0000 mg | ORAL_TABLET | Freq: Four times a day (QID) | ORAL | 0 refills | Status: AC | PRN

## 2019-09-22 NOTE — Plan of Care (Signed)
  Problem: Clinical Measurements: Goal: Cardiovascular complication will be avoided Outcome: Adequate for Discharge   Problem: Activity: Goal: Risk for activity intolerance will decrease Outcome: Adequate for Discharge   Problem: Nutrition: Goal: Adequate nutrition will be maintained Outcome: Adequate for Discharge   Problem: Coping: Goal: Level of anxiety will decrease Outcome: Adequate for Discharge   Problem: Elimination: Goal: Will not experience complications related to bowel motility Outcome: Adequate for Discharge Goal: Will not experience complications related to urinary retention Outcome: Adequate for Discharge

## 2019-09-22 NOTE — Discharge Instructions (Signed)
Discharge Instructions:  1. You may shower, please wash incisions daily with soap and water and keep dry.  If you wish to cover wounds with dressing you may do so but please keep clean and change daily.  No tub baths or swimming until incisions have completely healed.  If your incisions become red or develop any drainage please call our office at [phone]  2. No Driving until cleared by Dr. Leonarda Salon office and you are no longer using narcotic pain medications  3. Monitor your weight daily.. Please use the same scale and weigh at same time... If you gain 5-10 lbs in 48 hours with associated lower extremity swelling, please contact our office at [phone]  4. Fever of 101.5 for at least 24 hours with no source, please contact our office at [phone]  5. Activity- up as tolerated, please walk at least 3 times per day.  Avoid strenuous activity, no lifting, pushing, or pulling with your arms over 8-10 lbs for a minimum of 6 weeks  6. If any questions or concerns arise, please do not hesitate to contact our office at [phone]  7. Maintain security of chest tube with dressings / tape as needed. Keep the PleurEvac fluid collection box I the upright position.

## 2019-09-22 NOTE — Care Management Important Message (Signed)
Important Message  Patient Details  Name: [name] MRN: [id number] Date of Birth: 12-Oct-1940   Medicare Important Message Given:  Yes     Memory Argue 09/22/2019, 1:39 PM

## 2019-09-22 NOTE — TOC Transition Note (Signed)
Transition of Care [facility]) - CM/SW Discharge Note   Patient Details  Name: [name] MRN: [id number] Date of Birth: 11-Apr-1941  [facility] CM/SW Contact:  Zenon Mayo, RN Phone Number: 09/22/2019, 11:19 AM   Clinical Narrative:    Patient for dc to home today with miniexpress, NCM spoke with patient offered choice , she would like [facility] since they manage her insurance, referral given to [place] with [facility]. Awaiting to hear back.    Final next level of care: [place] Barriers to Discharge: No Barriers Identified   Patient Goals and CMS Choice Patient states their goals for this hospitalization and ongoing recovery are:: get better CMS Medicare.gov Compare Post Acute Care list provided to:: Patient Choice offered to / list presented to : Patient  Discharge Placement                       Discharge Plan and Services                DME Arranged: (NA)         HH Arranged: RN [place] Agency: [facility] [employer] [facility] [employer] Date [place]: 09/22/19 Time [place]: 1119 Representative spoke with at [place]: [place] ([place]) Interventions     Readmission Risk Interventions No flowsheet data found.

## 2019-09-22 NOTE — Progress Notes (Signed)
Pt discharging to home, all discharge instructions provided to patient with no questions at this time. Pt discharging with mini express, tube intact. Dsg changed prior to d/c. HH to follow at home. IV removed. No further needs noted at this time.

## 2019-09-22 NOTE — Progress Notes (Addendum)
11 Days Post-Op Procedure(s) (LRB): XI ROBOTIC ASSISTED THORASCOPY (Left) Node Dissection (Left) Thoracotomy/Lobectomy Left Upper Lobe (Left) Subjective: Says she rested well. Denies any change in respiratory status since yesterday.  On Ra past 8 hours with adequate O2 sats.   Objective: Vital signs in last 24 hours: Temp:  [97.5 F (36.4 C)-98.2 F (36.8 C)] 98.2 F (36.8 C) (01/19 0348) Pulse Rate:  [79-97] 84 (01/19 0348) Cardiac Rhythm: Normal sinus rhythm (01/19 0703) Resp:  [16-22] 16 (01/19 0348) BP: (94-121)/(54-70) 121/67 (01/19 0348) SpO2:  [90 %-98 %] 93 % (01/19 0348)    Intake/Output from previous day: 01/18 0701 - 01/19 0700 In: 480 [P.O.:480] Out: 0  Intake/Output this shift: No intake/output data recorded.  Physical Exam: General appearance:alert, cooperative andnodistress Neurologic:intact Heart:SR. Lungs:Breath sounds clear.Pigtail catheter is secure, on water seal since yesterday mornint.  Small air leak only seen with cough. CXR shows slight increase in left PTX to ~30%. She is breathing comfortably.   Lab Results: Recent Labs    09/21/19 0212  WBC 10.2  HGB 11.9*  HCT 36.7  PLT 460*   BMET:  Recent Labs    09/21/19 0212  NA 136  K 4.8  CL 100  CO2 28  GLUCOSE 102*  BUN 26*  CREATININE 1.11*  CALCIUM 8.8*    PT/INR: No results for input(s): LABPROT, INR in the last 72 hours. ABG    Component Value Date/Time   PHART 7.378 09/12/2019 0340   HCO3 24.1 09/12/2019 0340   TCO2 29 09/11/2019 1715   ACIDBASEDEF 0.4 09/12/2019 0340   O2SAT 92.3 09/12/2019 0340   CBG (last 3)  No results for input(s): GLUCAP in the last 72 hours.  EXAM: PORTABLE CHEST 1 VIEW  COMPARISON:  One-view chest x-ray 09/21/2019  FINDINGS: The heart is mildly enlarged. Atherosclerotic changes are noted at the aortic arch.  A left-sided chest tube remains in place. The pneumothorax has increased in size, now measuring up to 30% of the  hemithorax. Subcutaneous emphysema is again noted. Left basilar airspace disease is stable.  IMPRESSION: 1. Interval increase in size of left-sided pneumothorax, now measuring up to 30% of the hemithorax. 2. Stable left basilar airspace disease.   Electronically Signed   By: San Morelle M.D.   On: 09/22/2019 06:34   Assessment/Plan: S/P Procedure(s) (LRB): XI ROBOTIC ASSISTED THORASCOPY (Left) Node Dissection (Left) Thoracotomy/Lobectomy Left Upper Lobe (Left)  -POD-11robotic-assisted left upper lobectomy.CT removed1/12and post removal serialCXR'sshowed an enlargingPTX. A new pigtail pleural tube was placed1/13with resolution of the PTX. CT on water seal past 24 hours. There is an air leak noted only with cough and a slightly larger PTX. Considering discharge with pigtail catheter connected to [place].   -Expected acute blood loss anemia-resolved.  -Hypothyroidism- continued levothyroxine.  -DVT PPX- continue enoxaparin.   LOS: 11 days    Antony Odea, [place] [phone] 09/22/2019 Patient seen and examined, agree with above She still has a small air leak and a space on her CXR- Discussed options of home with tube, bronch for IBV and redo VATS for stapling. Of those options she favors going home with pigtail in place. Will need close followup as outpatient, she has good family support and we will arrange a [place] C. Roxan Hockey, MD [facility] [phone]

## 2019-09-23 DIAGNOSIS — Z483 Aftercare following surgery for neoplasm: Secondary | ICD-10-CM

## 2019-09-23 DIAGNOSIS — Z4801 Encounter for change or removal of surgical wound dressing: Secondary | ICD-10-CM

## 2019-09-23 DIAGNOSIS — C3412 Malignant neoplasm of upper lobe, left bronchus or lung: Secondary | ICD-10-CM | POA: Diagnosis not present

## 2019-09-24 ENCOUNTER — Ambulatory Visit (INDEPENDENT_AMBULATORY_CARE_PROVIDER_SITE_OTHER): Payer: Self-pay | Admitting: Thoracic Surgery (Cardiothoracic Vascular Surgery)

## 2019-09-24 ENCOUNTER — Other Ambulatory Visit: Payer: Self-pay | Admitting: *Deleted

## 2019-09-24 ENCOUNTER — Other Ambulatory Visit: Payer: Self-pay

## 2019-09-24 ENCOUNTER — Ambulatory Visit
Admission: RE | Admit: 2019-09-24 | Discharge: 2019-09-24 | Disposition: A | Discharge status: Home / Self Care | Payer: Medicare PPO | Source: Ambulatory Visit | Attending: Thoracic Surgery (Cardiothoracic Vascular Surgery) | Admitting: Thoracic Surgery (Cardiothoracic Vascular Surgery)

## 2019-09-24 ENCOUNTER — Encounter: Payer: Self-pay | Admitting: Thoracic Surgery (Cardiothoracic Vascular Surgery)

## 2019-09-24 VITALS — BP 92/58 | HR 108 | Temp 97.0°F | Resp 16 | Ht 65.0 in

## 2019-09-24 DIAGNOSIS — Z902 Acquired absence of lung [part of]: Secondary | ICD-10-CM

## 2019-09-24 DIAGNOSIS — J939 Pneumothorax, unspecified: Secondary | ICD-10-CM

## 2019-09-24 DIAGNOSIS — Z9689 Presence of other specified functional implants: Secondary | ICD-10-CM

## 2019-09-24 DIAGNOSIS — J95812 Postprocedural air leak: Secondary | ICD-10-CM

## 2019-09-24 NOTE — Progress Notes (Signed)
[address]       [place] [address]             [phone]       HPI: [name] daughter called in with concerns related to the chest tube and her mother's general condition.  [name] is a 79 year old woman with a history of tobacco abuse, renal cell carcinoma, nephrectomy in July 2020, hypothyroidism, and hypertriglyceridemia.  She found to have a 5 mm left upper lobe lung nodule on a CT in July.  A follow-up in November showed the nodule had increased to 1.1 cm.  There was a prominent left hilar node.  On PET both the left upper lobe nodule and hilar lymph node were hypermetabolic.  I did a left upper lobectomy.  We began with a robotic approach, but had to convert to thoracotomy due to a obvious malignant node that was coming off at the confluence of all the left upper lobe branches.  We were able to resect the tumor with negative margins.  It turned out to be a T1, N1, stage IIb squamous cell carcinoma.  Postoperatively she had a prolonged air leak.  She went home with a tube a couple of days ago.  Last night at home, her chest tube became disconnected.  Her daughter was there at the time so she said it was disconnected for less than a minute she hooked it back up.  Since she hooked that back up she has noticed about 100 mL of drainage.  In addition, [name] has had a poor appetite and has been having some dizziness.  She also has been having some more pain but is not taking any acetaminophen or tramadol.   Past Medical History:  Diagnosis Date  . Adenocarcinoma of left breast 04/16/2012   Stage II (T2 N0 M0) left-sided adenocarcinoma breast diagnosed in 1990 by Dr. Romona Curls and treated by Dr. Juanita Craver on NSAB PPD-19 with CMF followed by 5 years of tamoxifen and she remains disease free thus far.   . Breast cancer ([place])    left breast/mastectomy/dx approx1990  . Gall bladder stones   . Grave's disease   . High triglycerides   . History of kidney  stones    small stone  rt   . Nerve compression    pinched in neck  . Pneumonia 1990  . PONV (postoperative nausea and vomiting)      Current Outpatient Medications  Medication Sig Dispense Refill  . aspirin 325 MG tablet Take 1 tablet (325 mg total) by mouth every 6 (six) hours as needed for moderate pain. Resume in 1 week (Patient taking differently: Take 325 mg by mouth daily. )    . hydroxypropyl methylcellulose / hypromellose (ISOPTO TEARS / GONIOVISC) 2.5 % ophthalmic solution Place 1 drop into both eyes 3 (three) times daily as needed for dry eyes.    Marland Kitchen levothyroxine (SYNTHROID, LEVOTHROID) 100 MCG tablet Take 100 mcg by mouth daily before breakfast.     . meclizine (ANTIVERT) 50 MG tablet Take 25 mg by mouth 3 (three) times daily as needed for dizziness.     . Multiple Vitamins-Minerals (CENTRUM SILVER ULTRA WOMENS) TABS Take 1 tablet by mouth daily.     . simvastatin (ZOCOR) 20 MG tablet Take 20 mg by mouth at bedtime.      . traMADol (ULTRAM) 50 MG tablet Take 1 tablet (50 mg total) by mouth every 6 (six) hours as needed for  up to 5 days (mild pain). (Patient not taking: Reported on 09/24/2019) 20 tablet 0   No current facility-administered medications for this visit.    Physical Exam BP (!) 92/58 (BP Location: Right Arm, Patient Position: Sitting, Cuff Size: Normal)   Pulse (!) 108   Temp (!) 97 F (36.1 C)   Resp 16   Ht 5\' 5"  (1.651 m)   SpO2 96% Comment: ON RA  BMI 21.57 kg/m  79 year old woman in no acute distress but uncomfortable Alert and oriented x3 Lungs diminished at left base but otherwise clear Incisions clean dry and intact Minimal air leak from chest tube  Diagnostic Tests: CHEST - 2 VIEW  COMPARISON:  09/22/2019  FINDINGS: Interval decrease in size of a left lateral pneumothorax, now minimal, less than 5%, with a pigtail chest tube positioned about the superolateral pleural margin. Unchanged elevation of the left hemidiaphragm. Subcutaneous  emphysema about the left chest wall. The right lung is normally aerated. Disc degenerative disease of the thoracic spine.  IMPRESSION: Interval decrease in size of a left lateral pneumothorax, now minimal, less than 5%, with a pigtail chest tube positioned about the superolateral pleural margin.   Electronically Signed   By: Eddie Candle M.D. I personally reviewed the chest x-ray images and concur with the findings noted above.  Improved aeration of left lung with no residual pneumo.  Impression: [name] is a 79 year old woman who is about 2 weeks out from a left upper lobectomy.  This did require thoracotomy due to invasion of the main pulmonary artery at the takeoff of the left upper lobe branches.  She had a prolonged air leak postoperatively and went home with a pigtail catheter in place to a mini Sahar device.  Her chest tube became disconnected last night but was only disconnected for short period of time before her daughter reconnected it.  There is been no ill effects from that as her lung actually is better expanded now than it was when she left the hospital.  There has been minimal drainage from the tube which is nonpurulent.  There is minimal air leak.  Of greater concern is her general malaise.  She does appear dehydrated with relatively low blood pressure and decreased skin turgor.  Her p.o. intake has not been good.  I recommended hospital admission for IV hydration, but she was not agreeable to that.  She said she would rather try to hydrate at home.  I encouraged her to take acetaminophen on a regular basis and to use the tramadol if she is having significant pain.  I think one of the reasons she may be having a more pain now than she was previously is that the lung has more completely reexpanded and there is probably some pleural irritation from the chest tube.  Plan: Hydrate Keep chest tube in place Call if there are any issues with dizziness, poor appetite,  dehydration, or the chest tube. Has appointment for follow-up next Tuesday  [name], MD [facility] [phone]

## 2019-09-24 NOTE — Progress Notes (Signed)
The proposed treatment discussed in cancer conference 09/24/19 is for discussion purpose only and is not an binding recommendation.  The patient was not physically examined nor present for their treatment options.  Therefore, final treatment plans cannot be decided.

## 2019-09-29 ENCOUNTER — Ambulatory Visit
Admission: RE | Admit: 2019-09-29 | Discharge: 2019-09-29 | Disposition: A | Discharge status: Home / Self Care | Payer: Medicare PPO | Source: Ambulatory Visit | Attending: Thoracic Surgery (Cardiothoracic Vascular Surgery) | Admitting: Thoracic Surgery (Cardiothoracic Vascular Surgery)

## 2019-09-29 ENCOUNTER — Observation Stay (HOSPITAL_COMMUNITY)
Admission: AD | Admit: 2019-09-29 | Discharge: 2019-10-01 | Disposition: A | Discharge status: Home / Self Care | Payer: Medicare PPO | Source: Ambulatory Visit | Attending: Thoracic Surgery (Cardiothoracic Vascular Surgery) | Admitting: Thoracic Surgery (Cardiothoracic Vascular Surgery)

## 2019-09-29 ENCOUNTER — Other Ambulatory Visit: Payer: Self-pay | Admitting: *Deleted

## 2019-09-29 ENCOUNTER — Other Ambulatory Visit: Payer: Self-pay | Admitting: Thoracic Surgery (Cardiothoracic Vascular Surgery)

## 2019-09-29 ENCOUNTER — Ambulatory Visit (INDEPENDENT_AMBULATORY_CARE_PROVIDER_SITE_OTHER): Payer: Self-pay | Admitting: Thoracic Surgery (Cardiothoracic Vascular Surgery)

## 2019-09-29 ENCOUNTER — Encounter: Payer: Self-pay | Admitting: Thoracic Surgery (Cardiothoracic Vascular Surgery)

## 2019-09-29 ENCOUNTER — Other Ambulatory Visit: Payer: Self-pay

## 2019-09-29 ENCOUNTER — Encounter (HOSPITAL_COMMUNITY): Payer: Self-pay | Admitting: Thoracic Surgery (Cardiothoracic Vascular Surgery)

## 2019-09-29 VITALS — BP 92/63 | HR 113 | Temp 97.8°F | Resp 16 | Ht 65.0 in | Wt 116.6 lb

## 2019-09-29 DIAGNOSIS — Z853 Personal history of malignant neoplasm of breast: Secondary | ICD-10-CM | POA: Insufficient documentation

## 2019-09-29 DIAGNOSIS — Z888 Allergy status to other drugs, medicaments and biological substances status: Secondary | ICD-10-CM | POA: Diagnosis not present

## 2019-09-29 DIAGNOSIS — E860 Dehydration: Secondary | ICD-10-CM | POA: Diagnosis present

## 2019-09-29 DIAGNOSIS — Z79890 Hormone replacement therapy (postmenopausal): Secondary | ICD-10-CM | POA: Diagnosis not present

## 2019-09-29 DIAGNOSIS — E039 Hypothyroidism, unspecified: Secondary | ICD-10-CM | POA: Diagnosis not present

## 2019-09-29 DIAGNOSIS — Z9689 Presence of other specified functional implants: Secondary | ICD-10-CM

## 2019-09-29 DIAGNOSIS — C3412 Malignant neoplasm of upper lobe, left bronchus or lung: Secondary | ICD-10-CM

## 2019-09-29 DIAGNOSIS — Z85528 Personal history of other malignant neoplasm of kidney: Secondary | ICD-10-CM | POA: Insufficient documentation

## 2019-09-29 DIAGNOSIS — Z902 Acquired absence of lung [part of]: Secondary | ICD-10-CM | POA: Insufficient documentation

## 2019-09-29 DIAGNOSIS — E781 Pure hyperglyceridemia: Secondary | ICD-10-CM | POA: Insufficient documentation

## 2019-09-29 DIAGNOSIS — Z905 Acquired absence of kidney: Secondary | ICD-10-CM | POA: Diagnosis not present

## 2019-09-29 DIAGNOSIS — C3490 Malignant neoplasm of unspecified part of unspecified bronchus or lung: Secondary | ICD-10-CM

## 2019-09-29 DIAGNOSIS — Z87891 Personal history of nicotine dependence: Secondary | ICD-10-CM | POA: Insufficient documentation

## 2019-09-29 DIAGNOSIS — J95812 Postprocedural air leak: Secondary | ICD-10-CM

## 2019-09-29 DIAGNOSIS — R110 Nausea: Principal | ICD-10-CM | POA: Insufficient documentation

## 2019-09-29 DIAGNOSIS — Z20822 Contact with and (suspected) exposure to covid-19: Secondary | ICD-10-CM | POA: Insufficient documentation

## 2019-09-29 DIAGNOSIS — Z79899 Other long term (current) drug therapy: Secondary | ICD-10-CM | POA: Insufficient documentation

## 2019-09-29 DIAGNOSIS — Z7982 Long term (current) use of aspirin: Secondary | ICD-10-CM | POA: Diagnosis not present

## 2019-09-29 DIAGNOSIS — J939 Pneumothorax, unspecified: Secondary | ICD-10-CM

## 2019-09-29 DIAGNOSIS — D62 Acute posthemorrhagic anemia: Secondary | ICD-10-CM | POA: Insufficient documentation

## 2019-09-29 LAB — CBC
HCT: 27.6 % — ABNORMAL LOW (ref 36.0–46.0)
Hemoglobin: 9.1 g/dL — ABNORMAL LOW (ref 12.0–15.0)
MCH: 31.3 pg (ref 26.0–34.0)
MCHC: 33 g/dL (ref 30.0–36.0)
MCV: 94.8 fL (ref 80.0–100.0)
Platelets: 564 10*3/uL — ABNORMAL HIGH (ref 150–400)
RBC: 2.91 MIL/uL — ABNORMAL LOW (ref 3.87–5.11)
RDW: 13.8 % (ref 11.5–15.5)
WBC: 10.9 10*3/uL — ABNORMAL HIGH (ref 4.0–10.5)
nRBC: 0 % (ref 0.0–0.2)

## 2019-09-29 LAB — BASIC METABOLIC PANEL
Anion gap: 10 (ref 5–15)
BUN: 24 mg/dL — ABNORMAL HIGH (ref 8–23)
CO2: 22 mmol/L (ref 22–32)
Calcium: 8.6 mg/dL — ABNORMAL LOW (ref 8.9–10.3)
Chloride: 105 mmol/L (ref 98–111)
Creatinine, Ser: 0.92 mg/dL (ref 0.44–1.00)
GFR calc Af Amer: >60 mL/min (ref >60)
GFR calc non Af Amer: >60 mL/min (ref >60)
Glucose, Bld: 139 mg/dL — ABNORMAL HIGH (ref 70–99)
Potassium: 4.3 mmol/L (ref 3.5–5.1)
Sodium: 137 mmol/L (ref 135–145)

## 2019-09-29 LAB — SARS CORONAVIRUS 2 (TAT 6-24 HRS): SARS Coronavirus 2: NEGATIVE

## 2019-09-29 LAB — MRSA PCR SCREENING: MRSA by PCR: NEGATIVE

## 2019-09-29 MED ORDER — LEVOTHYROXINE SODIUM 100 MCG PO TABS
100.0000 ug | ORAL_TABLET | Freq: Every day | ORAL | Status: DC
  Administered 2019-09-30 – 2019-10-01 (×2): 100 ug via ORAL
  Filled 2019-09-29 (×2): qty 1

## 2019-09-29 MED ORDER — ENSURE ENLIVE PO LIQD: 237.0000 mL | Freq: Two times a day (BID) | ORAL | Status: DC

## 2019-09-29 MED ORDER — ACETAMINOPHEN 650 MG RE SUPP: 650.0000 mg | Freq: Four times a day (QID) | RECTAL | Status: DC | PRN

## 2019-09-29 MED ORDER — ENOXAPARIN SODIUM 40 MG/0.4ML ~~LOC~~ SOLN
40.0000 mg | SUBCUTANEOUS | Status: DC
  Administered 2019-09-29 – 2019-09-30 (×2): 40 mg via SUBCUTANEOUS
  Filled 2019-09-29 (×2): qty 0.4

## 2019-09-29 MED ORDER — ASPIRIN 325 MG PO TABS
325.0000 mg | ORAL_TABLET | Freq: Every day | ORAL | Status: DC
  Administered 2019-09-29 – 2019-09-30 (×2): 325 mg via ORAL
  Filled 2019-09-29 (×2): qty 1

## 2019-09-29 MED ORDER — ACETAMINOPHEN 325 MG PO TABS: 650.0000 mg | ORAL_TABLET | Freq: Four times a day (QID) | ORAL | Status: DC | PRN

## 2019-09-29 MED ORDER — PROMETHAZINE HCL 25 MG PO TABS: 12.5000 mg | ORAL_TABLET | Freq: Four times a day (QID) | ORAL | Status: DC | PRN

## 2019-09-29 MED ORDER — SODIUM CHLORIDE 0.9 % IV SOLN: INTRAVENOUS | Status: AC

## 2019-09-29 MED ORDER — SODIUM CHLORIDE 0.9% FLUSH
3.0000 mL | Freq: Two times a day (BID) | INTRAVENOUS | Status: DC
  Administered 2019-09-29 – 2019-10-01 (×3): 3 mL via INTRAVENOUS

## 2019-09-29 MED ORDER — SODIUM CHLORIDE 0.9 % IV SOLN: 250.0000 mL | INTRAVENOUS | Status: DC | PRN

## 2019-09-29 MED ORDER — PROMETHAZINE HCL 25 MG/ML IJ SOLN: 12.5000 mg | Freq: Four times a day (QID) | INTRAMUSCULAR | Status: DC | PRN

## 2019-09-29 MED ORDER — MECLIZINE HCL 25 MG PO TABS
25.0000 mg | ORAL_TABLET | Freq: Three times a day (TID) | ORAL | Status: DC | PRN
  Filled 2019-09-29: qty 1

## 2019-09-29 MED ORDER — PROMETHAZINE HCL 12.5 MG RE SUPP
12.5000 mg | Freq: Four times a day (QID) | RECTAL | Status: DC | PRN
  Filled 2019-09-29: qty 1

## 2019-09-29 MED ORDER — ONDANSETRON HCL 4 MG/2ML IJ SOLN
4.0000 mg | Freq: Four times a day (QID) | INTRAMUSCULAR | Status: DC | PRN
  Administered 2019-09-30: 15:00:00 4 mg via INTRAVENOUS
  Filled 2019-09-29: qty 2

## 2019-09-29 MED ORDER — KCL IN DEXTROSE-NACL 20-5-0.45 MEQ/L-%-% IV SOLN
INTRAVENOUS | Status: DC
  Filled 2019-09-29 (×4): qty 1000

## 2019-09-29 MED ORDER — POLYETHYLENE GLYCOL 3350 17 G PO PACK: 17.0000 g | PACK | Freq: Every day | ORAL | Status: DC | PRN

## 2019-09-29 MED ORDER — TRAMADOL HCL 50 MG PO TABS: 50.0000 mg | ORAL_TABLET | Freq: Four times a day (QID) | ORAL | Status: DC | PRN

## 2019-09-29 MED ORDER — SODIUM CHLORIDE 0.9% FLUSH: 3.0000 mL | INTRAVENOUS | Status: DC | PRN

## 2019-09-29 MED ORDER — SIMVASTATIN 20 MG PO TABS
20.0000 mg | ORAL_TABLET | Freq: Every day | ORAL | Status: DC
  Administered 2019-09-29 – 2019-09-30 (×2): 20 mg via ORAL
  Filled 2019-09-29 (×2): qty 1

## 2019-09-29 MED ORDER — ONDANSETRON HCL 4 MG PO TABS: 4.0000 mg | ORAL_TABLET | Freq: Four times a day (QID) | ORAL | Status: DC | PRN

## 2019-09-29 MED ORDER — BISACODYL 5 MG PO TBEC: 5.0000 mg | DELAYED_RELEASE_TABLET | Freq: Every day | ORAL | Status: DC | PRN

## 2019-09-29 NOTE — Plan of Care (Signed)

## 2019-09-29 NOTE — H&P (Signed)
[address]       [place] [address]             [phone]                HPI: [name] returns for follow-up regarding her prolonged air leak.  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  She had a left upper lobectomy on 09/11/2019.  Postoperatively she had air leak initially.  On postop day 6 it appeared to have resolved so her chest tube was removed.  Unfortunately she dropped her lung on the x-ray the following morning and pigtail catheter was placed.  She was discharged home with the pigtail in place on 09/21/2019.  She returned to the office on 09/24/2019 with complaints of dizziness.  Her chest tube had become disconnected briefly but her daughter reconnected that.  She also complained of a poor appetite.  On exam she was mildly tachycardic and appeared dehydrated.  She refused hospital admission at that time.  In the interim since then she continues to feel poorly.  She continues to complain of nausea.  She has had dry heaves.  She says that she has not been able to keep any food or liquids down.      Past Medical History:  Diagnosis Date  . Adenocarcinoma of left breast 04/16/2012   Stage II (T2 N0 M0) left-sided adenocarcinoma breast diagnosed in 1990 by Dr. Romona Curls and treated by Dr. Juanita Craver on NSAB PPD-19 with CMF followed by 5 years of tamoxifen and she remains disease free thus far.   . Breast cancer ([place])    left breast/mastectomy/dx approx1990  . Gall bladder stones   . Grave's disease   . High triglycerides   . History of kidney stones    small stone  rt   . Nerve compression    pinched in neck  . Pneumonia 1990  . PONV (postoperative nausea and vomiting)           Current Outpatient Medications  Medication Sig Dispense Refill  . acetaminophen  (TYLENOL) 325 MG tablet Take by mouth every 6 (six) hours as needed.    Marland Kitchen aspirin 325 MG tablet Take 1 tablet (325 mg total) by mouth every 6 (six) hours as needed for moderate pain. Resume in 1 week (Patient taking differently: Take 325 mg by mouth daily. )    . hydroxypropyl methylcellulose / hypromellose (ISOPTO TEARS / GONIOVISC) 2.5 % ophthalmic solution Place 1 drop into both eyes 3 (three) times daily as needed for dry eyes.    Marland Kitchen levothyroxine (SYNTHROID, LEVOTHROID) 100 MCG tablet Take 100 mcg by mouth daily before breakfast.     . meclizine (ANTIVERT) 50 MG tablet Take 25 mg by mouth 3 (three) times daily as needed for dizziness.     . Multiple Vitamins-Minerals (CENTRUM SILVER ULTRA WOMENS) TABS Take 1 tablet by mouth daily.     . simvastatin (ZOCOR) 20 MG tablet Take 20 mg by mouth at bedtime.      . traMADol (ULTRAM) 50 MG tablet Take 50 mg by mouth every 6 (six) hours as needed.     No current facility-administered medications for this visit.    Physical Exam BP 92/63 (BP Location: Right Arm, Patient Position: Sitting, Cuff Size: Normal)   Pulse (!) 113  Temp 97.8 F (36.6 C)   Resp 16   Ht 5\' 5"  (1.651 m)   Wt 116 lb 9 oz (52.9 kg)   SpO2 96% Comment: RA  BMI 19.44 kg/m  79 year old woman in no acute distress but appears ill Alert and oriented x3 with no focal deficits Lungs diminished at left base, otherwise clear No air leak, serous drainage from chest tube Cardiac tachycardic and regular Abdomen soft nontender Skin with decreased turgor  Diagnostic Tests: CHEST 1 VIEW  COMPARISON: September 24, 2019  FINDINGS: Chest tube remains on the left. There is subcutaneous air on the left but no appreciable pneumothorax currently. Postoperative changes noted on the left with volume loss. There is stable elevation of the left hemidiaphragm. The lungs elsewhere are clear. The heart size and pulmonary vascularity are normal. No adenopathy. There is  aortic atherosclerosis. There are surgical clips in the left axillary region with evidence of previous mastectomy on the left.  IMPRESSION: Postoperative change on the left with volume loss. Chest tube present on the left without appreciable pneumothorax. No edema or airspace opacity. Cardiac silhouette within normal limits. No adenopathy. Aortic Atherosclerosis (ICD10-I70.0).   Electronically Signed By: Lowella Grip III M.D. On: 09/29/2019 16:13 I personally reviewed the chest x-ray image and concur with the findings noted above  Impression: [name] is a 79 year old woman with a past history significant for renal cell carcinoma, nephrectomy, hypertriglyceridemia, hypothyroidism, tobacco abuse, and a stage IIb squamous cell carcinoma of the lung.  She underwent thoracotomy for left upper lobectomy on 09/11/2019.  We started robotically but had to convert due to a cancerous node at the confluence of the left upper lobe branches.  We were able to resect that with a clean margin.  That was complicated by prolonged air leak and she ultimately went home with a pigtail catheter in place.  Since going home she has had poor oral intake.  She complains of severe nausea.  She also complains of being unable to keep food down and having some dry heaves.  She is hypotensive and tachycardic on exam and appears dehydrated.  She needs to be admitted for IV hydration.  There is no air leak from the tube currently.  Her chest x-ray looks good.  We will plan to DC the tube tomorrow morning if there is no air leak.  Plan: Admit for IV hydration, control of nausea.

## 2019-09-29 NOTE — Progress Notes (Signed)
[address]       [place] [address]             [phone]     HPI: [name] returns for follow-up regarding her prolonged air leak.  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  She had a left upper lobectomy on 09/11/2019.  Postoperatively she had air leak initially.  On postop day 6 it appeared to have resolved so her chest tube was removed.  Unfortunately she dropped her lung on the x-ray the following morning and pigtail catheter was placed.  She was discharged home with the pigtail in place on 09/21/2019.  She returned to the office on 09/24/2019 with complaints of dizziness.  Her chest tube had become disconnected briefly but her daughter reconnected that.  She also complained of a poor appetite.  On exam she was mildly tachycardic and appeared dehydrated.  She refused hospital admission at that time.  In the interim since then she continues to feel poorly.  She continues to complain of nausea.  She has had dry heaves.  She says that she has not been able to keep any food or liquids down.  Past Medical History:  Diagnosis Date  . Adenocarcinoma of left breast 04/16/2012   Stage II (T2 N0 M0) left-sided adenocarcinoma breast diagnosed in 1990 by Dr. Romona Curls and treated by Dr. Juanita Craver on NSAB PPD-19 with CMF followed by 5 years of tamoxifen and she remains disease free thus far.   . Breast cancer ([place])    left breast/mastectomy/dx approx1990  . Gall bladder stones   . Grave's disease   . High triglycerides   . History of kidney stones    small stone  rt   . Nerve compression    pinched in neck  . Pneumonia 1990  . PONV (postoperative nausea and vomiting)     Current Outpatient Medications  Medication Sig Dispense Refill  . acetaminophen (TYLENOL) 325 MG tablet Take by mouth  every 6 (six) hours as needed.    Marland Kitchen aspirin 325 MG tablet Take 1 tablet (325 mg total) by mouth every 6 (six) hours as needed for moderate pain. Resume in 1 week (Patient taking differently: Take 325 mg by mouth daily. )    . hydroxypropyl methylcellulose / hypromellose (ISOPTO TEARS / GONIOVISC) 2.5 % ophthalmic solution Place 1 drop into both eyes 3 (three) times daily as needed for dry eyes.    Marland Kitchen levothyroxine (SYNTHROID, LEVOTHROID) 100 MCG tablet Take 100 mcg by mouth daily before breakfast.     . meclizine (ANTIVERT) 50 MG tablet Take 25 mg by mouth 3 (three) times daily as needed for dizziness.     . Multiple Vitamins-Minerals (CENTRUM SILVER ULTRA WOMENS) TABS Take 1 tablet by mouth daily.     . simvastatin (ZOCOR) 20 MG tablet Take 20 mg by mouth at bedtime.      . traMADol (ULTRAM) 50 MG tablet Take 50 mg by mouth every 6 (six) hours as needed.     No current facility-administered medications for this visit.    Physical Exam BP 92/63 (BP Location: Right Arm, Patient Position: Sitting, Cuff Size: Normal)   Pulse (!) 113   Temp 97.8 F (36.6 C)   Resp 16   Ht 5\' 5"  (1.651 m)   Wt 116  lb 9 oz (52.9 kg)   SpO2 96% Comment: RA  BMI 19.27 kg/m  79 year old woman in no acute distress but appears ill Alert and oriented x3 with no focal deficits Lungs diminished at left base, otherwise clear No air leak, serous drainage from chest tube Cardiac tachycardic and regular Abdomen soft nontender Skin with decreased turgor  Diagnostic Tests: CHEST  1 VIEW  COMPARISON:  September 24, 2019  FINDINGS: Chest tube remains on the left. There is subcutaneous air on the left but no appreciable pneumothorax currently. Postoperative changes noted on the left with volume loss. There is stable elevation of the left hemidiaphragm. The lungs elsewhere are clear. The heart size and pulmonary vascularity are normal. No adenopathy. There is aortic atherosclerosis. There are surgical clips in  the left axillary region with evidence of previous mastectomy on the left.  IMPRESSION: Postoperative change on the left with volume loss. Chest tube present on the left without appreciable pneumothorax. No edema or airspace opacity. Cardiac silhouette within normal limits. No adenopathy. Aortic Atherosclerosis (ICD10-I70.0).   Electronically Signed   By: Lowella Grip III M.D.   On: 09/29/2019 16:13 I personally reviewed the chest x-ray image and concur with the findings noted above  Impression: [name] is a 79 year old woman with a past history significant for renal cell carcinoma, nephrectomy, hypertriglyceridemia, hypothyroidism, tobacco abuse, and a stage IIb squamous cell carcinoma of the lung.  She underwent thoracotomy for left upper lobectomy on 09/11/2019.  We started robotically but had to convert due to a cancerous node at the confluence of the left upper lobe branches.  We were able to resect that with a clean margin.  That was complicated by prolonged air leak and she ultimately went home with a pigtail catheter in place.  Since going home she has had poor oral intake.  She complains of severe nausea.  She also complains of being unable to keep food down and having some dry heaves.  She is hypotensive and tachycardic on exam and appears dehydrated.  She needs to be admitted for IV hydration.  There is no air leak from the tube currently.  Her chest x-ray looks good.  We will plan to DC the tube tomorrow morning if there is no air leak.  Plan: Admit for IV hydration  [name], MD [facility] [phone]

## 2019-09-30 ENCOUNTER — Observation Stay (HOSPITAL_COMMUNITY): Payer: Medicare PPO

## 2019-09-30 DIAGNOSIS — R110 Nausea: Secondary | ICD-10-CM | POA: Diagnosis not present

## 2019-09-30 LAB — BASIC METABOLIC PANEL
Anion gap: 8 (ref 5–15)
BUN: 22 mg/dL (ref 8–23)
CO2: 22 mmol/L (ref 22–32)
Calcium: 8.4 mg/dL — ABNORMAL LOW (ref 8.9–10.3)
Chloride: 105 mmol/L (ref 98–111)
Creatinine, Ser: 0.8 mg/dL (ref 0.44–1.00)
GFR calc Af Amer: >60 mL/min (ref >60)
GFR calc non Af Amer: >60 mL/min (ref >60)
Glucose, Bld: 117 mg/dL — ABNORMAL HIGH (ref 70–99)
Potassium: 4.4 mmol/L (ref 3.5–5.1)
Sodium: 135 mmol/L (ref 135–145)

## 2019-09-30 LAB — CBC
HCT: 27.5 % — ABNORMAL LOW (ref 36.0–46.0)
Hemoglobin: 9 g/dL — ABNORMAL LOW (ref 12.0–15.0)
MCH: 31.4 pg (ref 26.0–34.0)
MCHC: 32.7 g/dL (ref 30.0–36.0)
MCV: 95.8 fL (ref 80.0–100.0)
Platelets: 554 10*3/uL — ABNORMAL HIGH (ref 150–400)
RBC: 2.87 MIL/uL — ABNORMAL LOW (ref 3.87–5.11)
RDW: 13.6 % (ref 11.5–15.5)
WBC: 9.4 10*3/uL (ref 4.0–10.5)
nRBC: 0 % (ref 0.0–0.2)

## 2019-09-30 MED ORDER — FERROUS GLUCONATE 324 (38 FE) MG PO TABS: 324.0000 mg | ORAL_TABLET | Freq: Three times a day (TID) | ORAL | Status: DC

## 2019-09-30 MED ORDER — ENSURE ENLIVE PO LIQD
237.0000 mL | Freq: Three times a day (TID) | ORAL | Status: DC
  Administered 2019-09-30: 16:00:00 237 mL via ORAL

## 2019-09-30 MED ORDER — ASPIRIN EC 81 MG PO TBEC
81.0000 mg | DELAYED_RELEASE_TABLET | Freq: Every day | ORAL | Status: DC
  Administered 2019-10-01: 10:00:00 81 mg via ORAL
  Filled 2019-09-30: qty 1

## 2019-09-30 MED ORDER — FERROUS GLUCONATE 324 (38 FE) MG PO TABS: 324.0000 mg | ORAL_TABLET | Freq: Two times a day (BID) | ORAL | Status: DC

## 2019-09-30 NOTE — Progress Notes (Signed)
Pigtail chest tube removed without difficulty.  Occlusive dressing applied. Patient tolerated well

## 2019-09-30 NOTE — Progress Notes (Addendum)
  Subjective: Sitting up eating breakfast, says she feels much better. Denies nausea or shortness of breath.   Objective: Vital signs in last 24 hours: Temp:  [97.7 F (36.5 C)-98.4 F (36.9 C)] 97.7 F (36.5 C) (01/27 0737) Pulse Rate:  [69-113] 71 (01/27 0343) Cardiac Rhythm: Normal sinus rhythm;Bundle branch block (01/26 1903) Resp:  [16-27] 18 (01/27 0343) BP: (92-112)/(62-67) 106/62 (01/27 0343) SpO2:  [95 %-98 %] 98 % (01/27 0343) Weight:  [52.1 kg-52.9 kg] 52.1 kg (01/26 1800)     Intake/Output from previous day: 01/26 0701 - 01/27 0700 In: 1026.7 [I.V.:1026.7] Out: 10 [Chest Tube:10] Intake/Output this shift: No intake/output data recorded.  Physical Exam: General appearance: alert, cooperative and no distress Neurologic: intact Heart: regular rate and rhythm Lungs: Breath sounds clear. No air leak from the left pleural pigtail catheter.  Wound: The left chest incision and port sites are healing well.   Lab Results: Recent Labs    09/29/19 2011 09/30/19 0215  WBC 10.9* 9.4  HGB 9.1* 9.0*  HCT 27.6* 27.5*  PLT 564* 554*   BMET:  Recent Labs    09/29/19 2011 09/30/19 0215  NA 137 135  K 4.3 4.4  CL 105 105  CO2 22 22  GLUCOSE 139* 117*  BUN 24* 22  CREATININE 0.92 0.80  CALCIUM 8.6* 8.4*    PT/INR: No results for input(s): LABPROT, INR in the last 72 hours. ABG    Component Value Date/Time   PHART 7.378 09/12/2019 0340   HCO3 24.1 09/12/2019 0340   TCO2 29 09/11/2019 1715   ACIDBASEDEF 0.4 09/12/2019 0340   O2SAT 92.3 09/12/2019 0340   CBG (last 3)  No results for input(s): GLUCAP in the last 72 hours.  Assessment/Plan:  -Hospital day 1 following re-admission for persistent nausea, dehydration after left VATS / thoracotomy and left upper lobectomy for stage IIb squamous cell carcinoma of LUL. She was discharged with a small-bore pigtail catheter for a persistent air leak that has resolved. Remove the pleural tube this morning, repeat the  CXR later this afternoon.    -Feeling much better today after IV hydration. Nausea resolved, tolerating po's. Will slow IVF to 75ml/hr.   -Hypothyroid- Synthroid resumed.   -Expected acute blood loss anemia- Hct stable. Supplement iron.   -DVT PPX- SQ enoxaparin.      LOS: 1 day    Antony Odea, [place] [phone] 09/30/2019 Patient seen and examined, agree with above C/o being sleepy. Feels better today, was able to eat breakfast without undue nausea. CT out- will check CXR this morning and again in AM Anemic but will hold off on Fe fro now due to GI issues  Remo Lipps C. Roxan Hockey, MD [facility] [phone]

## 2019-09-30 NOTE — Progress Notes (Signed)
Initial Nutrition Assessment  RD working remotely.  DOCUMENTATION CODES:   Not applicable, suspect malnutrition but unable to confirm at this time  INTERVENTION:   - Recommend liberalizing diet to Regular  - Ensure Enlive po TID, each supplement provides 350 kcal and 20 grams of protein  - Encourage adequate PO intake  NUTRITION DIAGNOSIS:   Increased nutrient needs related to cancer and cancer related treatments, post-op healing as evidenced by estimated needs.  GOAL:   Patient will meet greater than or equal to 90% of their needs  MONITOR:   PO intake, Supplement acceptance, Labs, Weight trends  REASON FOR ASSESSMENT:   Malnutrition Screening Tool    ASSESSMENT:   79 year old female with a PMH of renal cell carcinoma s/p nephrectomy, hypertriglyceridemia, hypothyroidism, tobacco abuse, and a stage IIb squamous cell carcinoma of the lung. Pt underwent thoracotomy for left upper lobectomy on 11/03/41 which was complicated by prolonged air leak and pt ultimately went home with a pigtail catheter in place. Since going home, pt has had poor oral intake and N/V. Pt admitted on 1/26 with dehydration, hypotension, and tachycardia.  Noted plan to remove pleural tube this AM with repeat CXR later this afternoon. Per MD note, pt able to eat breakfast this AM without nausea.  Pt is on a Heart Healthy diet. No meal completions documented.  RD was unable to reach pt via phone call to room.  Reviewed weight history in chart. Pt with progressive weight loss since November 2020. Pt with an 8 kg weight loss since 07/13/19. This is a 13.3% weight loss in less than 3 months which is significant for timeframe. Suspect malnutrition but RD unable to confirm without detailed diet history or NFPE.  Will increase Ensure Enlive order to TID to aid pt in meeting kcal and protein needs.  Medications reviewed and include: Ensure Enlive BID, Fergon IVF: D5 and 1/2NS with KCl @ 75 ml/hr  Labs  reviewed: hemoglobin 9.0  NUTRITION - FOCUSED PHYSICAL EXAM:  Unable to complete at this time. RD working remotely.  Diet Order:   Diet Order            Diet Heart Room service appropriate? Yes; Fluid consistency: Thin  Diet effective now              EDUCATION NEEDS:      Skin:  Skin Assessment: Skin Integrity Issues: Incisions: closed left chest  Last BM:  09/29/19  Height:   Ht Readings from Last 1 Encounters:  09/29/19 5\' 5"  (1.651 m)    Weight:   Wt Readings from Last 1 Encounters:  09/29/19 52.1 kg    Ideal Body Weight:  56.8 kg  BMI:  Body mass index is 19.11 kg/m.  Estimated Nutritional Needs:   Kcal:  1400-1600  Protein:  65-80 grams  Fluid:  >/= 1.4 L    Gaynell Face, MS, RD, LDN Inpatient Clinical Dietitian Pager: [phone] Weekend/After Hours: [phone]

## 2019-10-01 ENCOUNTER — Observation Stay (HOSPITAL_COMMUNITY): Payer: Medicare PPO

## 2019-10-01 DIAGNOSIS — R110 Nausea: Secondary | ICD-10-CM | POA: Diagnosis not present

## 2019-10-01 MED ORDER — ONDANSETRON HCL 4 MG PO TABS: 4.0000 mg | ORAL_TABLET | Freq: Three times a day (TID) | ORAL | 1 refills | Status: AC | PRN

## 2019-10-01 NOTE — Discharge Summary (Signed)
Physician Discharge Summary  Patient ID: [name] MRN: [id number] DOB/AGE: 1940/09/11 79 y.o.  Admit date: 09/29/2019 Discharge date: 10/01/2019  Admission Diagnoses: Nausea Dehydration S/P left thoracotomy with left upper lobectomy for lung cancer History of adenocarcinoma of the breast History of left clear cell renal carcinoma   Discharge Diagnoses:  Nausea Dehydration S/P left thoracotomy with left upper lobectomy for lung cancer History of adenocarcinoma of the breast History of left clear cell renal carcinoma  Discharged Condition: stable  HPI:[name] returns for follow-up regarding her prolonged air leak.[name] is a 79 year old woman with a history of tobacco abuse, renal cell carcinoma, nephrectomy in July 2020, hypothyroidism, hypertriglyceridemia, and a 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 She had a left upper lobectomy on 09/11/2019. Postoperatively she had air leak initially. On postop day 6 it appeared to have resolved so her chest tube was removed. Unfortunately she dropped her lung on the x-ray the following morning and pigtail catheter was placed. She was discharged home with the pigtail in place on 09/21/2019.She returned to the office on 09/24/2019 with complaints of dizziness. Her chest tube had become disconnected briefly but her daughter reconnected that. She also complained of a poor appetite. On exam she was mildly tachycardic and appeared dehydrated. She refused hospital admission at that time.In the interim since then she continues to feel poorly. She continues to complain of nausea. She has had dry heaves. She says that she had not been able to keep any food or liquids down.  Hospital Course:  [name] was admitted on observation status and hydrated with 1L IV normal saline followed by maintenance IVF for 24 hours. She quickly improved and nausea resolved. She only required a single dose of Zofran and did not take any of  the Phenergan that was made available to her.  Her abdominal exam remained benign. Her chest xray was stable and there was no air leak seen in the mini PleurEvac. The left pleural pigtail catheter was removed on 09/30/19 and follow up CXRs obtained that day and on the following morning were stable with no PTX. She was discharged in stable condition.   Consults: None   Significant Diagnostic Studies: EXAM: CHEST - 2 VIEW  COMPARISON:  09/29/2018  FINDINGS: Cardiac shadow is stable. Elevation of the left hemidiaphragm is noted with volume loss on the left consistent with the prior surgical history. The lungs are otherwise clear. No focal infiltrate or sizable effusion is seen. No recurrent pneumothorax is noted.  IMPRESSION: No recurrent pneumothorax.  Status post lobectomy on the left.   Electronically Signed   By: Inez Catalina M.D.   On: 10/01/2019 08:09  Treatments: IV hydration  Discharge Exam: Blood pressure (!) 103/58, pulse 77, temperature 97.7 F (36.5 C), temperature source Other (Comment), resp. rate 15, height 5\' 5"  (1.651 m), weight 52.1 kg, SpO2 97 %.  Physical Exam: General appearance:alert, cooperative and no distress Neurologic:intact Heart:regular rate and rhythm Lungs:Breath sounds clear. Wound:The left chest incision and port sites are healing well.  Disposition:  [name] is discharged to home in stable condition.     Allergies as of 10/01/2019      Reactions   Chantix [varenicline]    depression      Medication List    TAKE these medications   acetaminophen 325 MG tablet Commonly known as: TYLENOL Take 325-650 mg by mouth every 6 (six) hours as needed for mild pain, moderate pain or headache.  aspirin 325 MG tablet Take 1 tablet (325 mg total) by mouth every 6 (six) hours as needed for moderate pain. Resume in 1 week What changed:   when to take this  additional instructions   Centrum Silver Ultra Womens Tabs Take 1 tablet  by mouth daily.   hydroxypropyl methylcellulose / hypromellose 2.5 % ophthalmic solution Commonly known as: ISOPTO TEARS / GONIOVISC Place 1 drop into both eyes 3 (three) times daily as needed for dry eyes.   levothyroxine 100 MCG tablet Commonly known as: SYNTHROID Take 100 mcg by mouth daily before breakfast.   meclizine 50 MG tablet Commonly known as: ANTIVERT Take 25 mg by mouth 3 (three) times daily as needed for dizziness.   ondansetron 4 MG tablet Commonly known as: Zofran Take 1 tablet (4 mg total) by mouth every 8 (eight) hours as needed for up to 15 days for nausea or vomiting.   simvastatin 20 MG tablet Commonly known as: ZOCOR Take 20 mg by mouth at bedtime.      Follow-up Information    [name], MD. Go on 10/20/2019.   Specialty: Cardiothoracic Surgery Why: You have an appointment with Dr. Roxan Hockey on Tuesday, 10/20/19 at 4pm.   Please arrive 30 minutes early for a chest xray to be performed by [facility] located on the 1st floor of the same building.  Contact information: [address] [place] [address] [phone]           Signed: Antony Odea, PA-C 10/01/2019, 10:55 AM

## 2019-10-01 NOTE — Progress Notes (Addendum)
  Subjective: Awake and alert, no complaints or concerns. Tolerating PO's well and is independent with mobility.  Objective: Vital signs in last 24 hours: Temp:  [97.2 F (36.2 C)-98.4 F (36.9 C)] 98 F (36.7 C) (01/28 0657) Pulse Rate:  [72-79] 79 (01/28 0657) Cardiac Rhythm: Normal sinus rhythm;Bundle branch block (01/27 1901) Resp:  [14-18] 17 (01/28 0657) BP: (94-108)/(52-61) 100/52 (01/28 0657) SpO2:  [95 %-100 %] 100 % (01/28 0657)     Intake/Output from previous day: 01/27 0701 - 01/28 0700 In: 2031.1 [I.V.:2031.1] Out: -  Intake/Output this shift: No intake/output data recorded.  Physical Exam: General appearance: alert, cooperative and no distress Neurologic: intact Heart: regular rate and rhythm Lungs: Breath sounds clear. Wound: The left chest incision and port sites are healing well.   Lab Results: Recent Labs    09/29/19 2011 09/30/19 0215  WBC 10.9* 9.4  HGB 9.1* 9.0*  HCT 27.6* 27.5*  PLT 564* 554*   BMET:  Recent Labs    09/29/19 2011 09/30/19 0215  NA 137 135  K 4.3 4.4  CL 105 105  CO2 22 22  GLUCOSE 139* 117*  BUN 24* 22  CREATININE 0.92 0.80  CALCIUM 8.6* 8.4*    PT/INR: No results for input(s): LABPROT, INR in the last 72 hours. ABG    Component Value Date/Time   PHART 7.378 09/12/2019 0340   HCO3 24.1 09/12/2019 0340   TCO2 29 09/11/2019 1715   ACIDBASEDEF 0.4 09/12/2019 0340   O2SAT 92.3 09/12/2019 0340   CBG (last 3)  No results for input(s): GLUCAP in the last 72 hours.  EXAM: CHEST - 2 VIEW  COMPARISON:  09/29/2018  FINDINGS: Cardiac shadow is stable. Elevation of the left hemidiaphragm is noted with volume loss on the left consistent with the prior surgical history. The lungs are otherwise clear. No focal infiltrate or sizable effusion is seen. No recurrent pneumothorax is noted.  IMPRESSION: No recurrent pneumothorax.  Status post lobectomy on the left.   Electronically Signed   By: [name]  M.D.   On: 10/01/2019 08:09   Assessment/Plan:   [name] day 2 following re-admission for persistent nausea, dehydration after left VATS / thoracotomy and left upper lobectomy for stage IIb squamous cell carcinoma of LUL. She was recently discharged with a small-bore pigtail catheter for a persistent air leak that has resolved. Removed the pleural tube yesterday and follow up CXR shows no PTX.   -Nausea / Dehydration- resolved. Has adequate oral intake. D/C IVF  -Hypothyroid- Synthroid resumed.   -Expected acute blood loss anemia- Hct stable.   -Will discharge to home today.    Shelby Odea, PA-C [phone] 10/01/2019 Patient seen and examined, agree with above  Shelby Lipps C. Roxan Hockey, MD [facility] [phone]

## 2019-10-01 NOTE — Discharge Instructions (Signed)

## 2019-10-01 NOTE — Progress Notes (Signed)
CSW spoke with Bradd Canary with [facility] and made outpatient palliative referral, they will follow up with patient's daughter [name] at [phone].  Pricilla Riffle, LCSW

## 2019-10-01 NOTE — Progress Notes (Signed)
PT ambulated ~335ft in the hall. Denied SOB, pain, nausua. Back in the bed, with call bell within reach.

## 2019-10-02 ENCOUNTER — Telehealth: Payer: Self-pay | Admitting: Internal Medicine

## 2019-10-02 NOTE — Telephone Encounter (Signed)
Spoke with patient's daughter [name] regarding Palliative services and all questions were answered, she was in agreement with this.  I have scheduled a Telephone Consult for 10/08/19 @ 11 AM.

## 2019-10-05 ENCOUNTER — Ambulatory Visit (HOSPITAL_COMMUNITY): Payer: Medicare PPO | Admitting: Hematology

## 2019-10-06 ENCOUNTER — Ambulatory Visit: Payer: Medicare PPO | Admitting: Thoracic Surgery (Cardiothoracic Vascular Surgery)

## 2019-10-07 NOTE — Progress Notes (Signed)
Feb 3rd, 2021 [facility] Palliative Care Consult Note Telephone: [phone]  Fax: [phone]  Due to the current COVID-19 infection/crises, the family prefer, and have given their verbal consent for, a provider visit via telemedicine. HIPPA policies of confidentially were discussed. Video-audio (telehealth) contact was unable to be done due technical barriers from the patient's side.  PATIENT NAME: [name] DOB: September 12, 1940 MRN: [id number] [address] [place] [address]. Phone: [phone]  PRIMARY CARE PROVIDER:    Alanson Puls, [facility], Dr. Jani Gravel [address] [place],  [place] [address] [place] Charon ([place]) f/u feb 16 Sreedhar Katragadda ([place]) [place] Feb 22nd . Plan of therapy.    REFERRING PROVIDER:  Dr. Jani Gravel   RESPONSIBLE PARTY: [name] (daughter) [name]) [phone]  *Call this number for Consult* [email or web address] .[name] (daughter) [phone] (M)   ASSESSMENT / RECOMMENDATIONS:  1. Advance Care Planning: A. Directives: Discussed; Patient not sure of decision for advance cardiac life support in the event of a cardiopulmonary arrest. We touched on aspects of the MOST form very briefly. I e-mailed her and her daughters companion patient education material regarding the MOST, and will address this on future visits.  Shelby. Goals of Care:  to Gain in strength; f/u therapeutic options for Rx lung cancer.  2. Symptom Management: A. Fatigue: Reassured patient that it will take some time for her to recover her strength s/p surgical procedure and subsequent hospitalization. -I e-mailed her some low impact LE exercises for her to start attempting. Gradual increase in repetitions to build up to 10 sets of each of 6 exercises, up to tid.   3. Cognitive / Functional status: Patient is A & O x 3. She is gradually gaining some of her strength back, so that she can ambulate without her walker (steadies herself on  walls and furniture) about the home. Spends most of her time sitting. Struggling with weakness, fatigue, and lack of energy. No falls. Episodic nausea; no emesis but occasional dry heaves and coughing up of phlegm. Prn Zofran to good effect. Improved oral intake so that she now eats25-50% of 2-3 meals/day. Her weight is 115 lbs. At a height of 5'5" her BMI is 19.1. Intolerance to Ensure or Boost. Will drink diet Sun Drop. Daughter pushing soups/broth.  Constipation; LBM 2 days ago small amounts. Has been taking daily Senekot; small amounts prune juice. Ongoing tobacco use since age 39 or 79 yo. Unsuccessful past attempts to quit, adverse reaction to Chantix. Mild pain L chest side of lobectomy well managed with prn Tylenol. There are a few stitches intact from prior chest tube site. Daughter reports no signs redness/inflammation at that site.  -trial [employer]  -increase Senna 8.6mg  to 1-3 tabs qd to bid; titrate to effect  -Glycerin suppositories; Fleets enema if no BM by tomorrow. I review with daughters how to administer.  -I didn't discuss tobacco cessation; plan to talk about just decreasing amount smoked/day will help.  -daughter plans to call Dr. Roxan Hockey; are stitches dissolvable or do they need to be clipped out? Can wait till [place] Feb 16th?  4. Family Supports: Daughter is temporarily staying with patient, she is on FMLA.  5. Follow up Palliative Care Visit: Thursday March 11th at noon; tele-health telephone call  I spent 60 minutes providing this consultation from 11am to noon. More than 50% of the time in this consultation was spent coordinating communication.   HISTORY OF PRESENT ILLNESS:  [name]  [name] is a 79 y.o. female with history of tobacco abuse, renal cell carcinoma (adrenal sparing nephrectomy July 2020), hypothyroidism (post treatment of Graves disease), hypertriglyceridemia, stage II adenocarcinoma L breast (L mastectomy 1990), and a stage IIb squamous cell  carcinoma of the left upper lobe (LU lobectomy 09/11/2019. Pigtail catheter).  1/8-1/19/2021: L U lobectomy; post of air leak. Discharged with pigtail cath to mini PleurEvac 1/26-1/28/2021 hospitalized for dehydration. L pigtail catheter removed 09/30/19.  Palliative Care was asked to help address goals of care.   CODE STATUS: full code  PPS: 50%  HOSPICE ELIGIBILITY/DIAGNOSIS: TBD  PAST MEDICAL HISTORY:  Past Medical History:  Diagnosis Date  . Adenocarcinoma of left breast 04/16/2012   Stage II (T2 N0 M0) left-sided adenocarcinoma breast diagnosed in 1990 by Dr. Romona Curls and treated by Dr. Juanita Craver on NSAB PPD-19 with CMF followed by 5 years of tamoxifen and she remains disease free thus far.   . Breast cancer ([place])    left breast/mastectomy/dx approx1990  . Gall bladder stones   . Grave's disease   . High triglycerides   . History of kidney stones    small stone  rt   . Nerve compression    pinched in neck  . Pneumonia 1990  . PONV (postoperative nausea and vomiting)     SOCIAL HX:  Social History   Tobacco Use  . Smoking status: Current Every Day Smoker    Packs/day: 1.00    Years: 60.00    Pack years: 60.00    Types: Cigarettes  . Smokeless tobacco: Never Used  . Tobacco comment: attempted to stop but its expensive to quit  Substance Use Topics  . Alcohol use: No    ALLERGIES:  Allergies  Allergen Reactions  . Chantix [Varenicline]     depression     PERTINENT MEDICATIONS:  Outpatient Encounter Medications as of 10/08/2019  Medication Sig  . acetaminophen (TYLENOL) 325 MG tablet Take 325-650 mg by mouth every 6 (six) hours as needed for mild pain, moderate pain or headache.   Marland Kitchen aspirin 325 MG tablet Take 1 tablet (325 mg total) by mouth every 6 (six) hours as needed for moderate pain. Resume in 1 week (Patient taking differently: Take 325 mg by mouth daily. )  . hydroxypropyl methylcellulose / hypromellose (ISOPTO TEARS / GONIOVISC) 2.5 % ophthalmic solution  Place 1 drop into both eyes 3 (three) times daily as needed for dry eyes.  Marland Kitchen levothyroxine (SYNTHROID, LEVOTHROID) 100 MCG tablet Take 100 mcg by mouth daily before breakfast.   . meclizine (ANTIVERT) 50 MG tablet Take 25 mg by mouth 3 (three) times daily as needed for dizziness.   . Multiple Vitamins-Minerals (CENTRUM SILVER ULTRA WOMENS) TABS Take 1 tablet by mouth daily.   . ondansetron (ZOFRAN) 4 MG tablet Take 1 tablet (4 mg total) by mouth every 8 (eight) hours as needed for up to 15 days for nausea or vomiting.  . simvastatin (ZOCOR) 20 MG tablet Take 20 mg by mouth at bedtime.     No facility-administered encounter medications on file as of 10/08/2019.    PHYSICAL EXAM:   PE deferred d/t telehealth telephonic nature of visit Patient is A & O. Pleasantly conversant. Daughters in attendance.  Julianne Handler, NP

## 2019-10-08 ENCOUNTER — Encounter: Payer: Self-pay | Admitting: Internal Medicine

## 2019-10-08 ENCOUNTER — Other Ambulatory Visit: Payer: Self-pay

## 2019-10-08 ENCOUNTER — Telehealth: Payer: Self-pay

## 2019-10-08 ENCOUNTER — Other Ambulatory Visit: Payer: Medicare PPO | Admitting: Internal Medicine

## 2019-10-08 DIAGNOSIS — Z515 Encounter for palliative care: Secondary | ICD-10-CM

## 2019-10-08 DIAGNOSIS — Z7189 Other specified counseling: Secondary | ICD-10-CM

## 2019-10-08 NOTE — Telephone Encounter (Signed)
Pt's daughter calls office to inquire about suture removal for pt's chest tube incision site. Reports that she doesn't have an appointment until her f/u visit w/ Dr. Roxan Hockey on 2/16.  According to d/c summary, pt had a left pleural pigtail catheter that was removed on 09/30/19. Informed dgt that sutures are generally removed between 10-14 days if the incision is well approximated and recommended that she schedule an appointment for suture removal. Dgt reports pt is receiving home health nursing care and asks if it is okay for pt's home care RN to remove the suture.  Informed her that it would be okay for RN to remove the suture on/after 10/14/19 if the incision is well approximated and there are no s/s of infection noted. Advised that she contact our office if there are any s/s of infection or other problems.

## 2019-10-12 ENCOUNTER — Telehealth: Payer: Self-pay | Admitting: Internal Medicine

## 2019-10-12 NOTE — Telephone Encounter (Signed)
12:24pm:  TC to PCG daughter [name] [phone]) who had sent an earlier e-mail mentioning that tho

## 2019-10-13 ENCOUNTER — Other Ambulatory Visit: Payer: Self-pay

## 2019-10-13 ENCOUNTER — Telehealth: Payer: Self-pay

## 2019-10-13 ENCOUNTER — Emergency Department (HOSPITAL_COMMUNITY): Payer: Medicare PPO

## 2019-10-13 ENCOUNTER — Emergency Department (HOSPITAL_COMMUNITY)
Admission: EM | Admit: 2019-10-13 | Discharge: 2019-10-13 | Disposition: A | Discharge status: Home / Self Care | Payer: Medicare PPO | Attending: Emergency Medicine | Admitting: Emergency Medicine

## 2019-10-13 ENCOUNTER — Encounter (HOSPITAL_COMMUNITY): Payer: Self-pay | Admitting: Emergency Medicine

## 2019-10-13 DIAGNOSIS — R531 Weakness: Secondary | ICD-10-CM | POA: Diagnosis not present

## 2019-10-13 DIAGNOSIS — Z79899 Other long term (current) drug therapy: Secondary | ICD-10-CM | POA: Insufficient documentation

## 2019-10-13 DIAGNOSIS — Z7982 Long term (current) use of aspirin: Secondary | ICD-10-CM | POA: Insufficient documentation

## 2019-10-13 DIAGNOSIS — Z85528 Personal history of other malignant neoplasm of kidney: Secondary | ICD-10-CM | POA: Insufficient documentation

## 2019-10-13 DIAGNOSIS — K921 Melena: Secondary | ICD-10-CM | POA: Diagnosis not present

## 2019-10-13 DIAGNOSIS — Z853 Personal history of malignant neoplasm of breast: Secondary | ICD-10-CM | POA: Insufficient documentation

## 2019-10-13 LAB — CBC
HCT: 28.8 % — ABNORMAL LOW (ref 36.0–46.0)
Hemoglobin: 8.8 g/dL — ABNORMAL LOW (ref 12.0–15.0)
MCH: 29.2 pg (ref 26.0–34.0)
MCHC: 30.6 g/dL (ref 30.0–36.0)
MCV: 95.7 fL (ref 80.0–100.0)
Platelets: 487 10*3/uL — ABNORMAL HIGH (ref 150–400)
RBC: 3.01 MIL/uL — ABNORMAL LOW (ref 3.87–5.11)
RDW: 13.2 % (ref 11.5–15.5)
WBC: 8.2 10*3/uL (ref 4.0–10.5)
nRBC: 0 % (ref 0.0–0.2)

## 2019-10-13 LAB — BASIC METABOLIC PANEL
Anion gap: 13 (ref 5–15)
BUN: 17 mg/dL (ref 8–23)
CO2: 23 mmol/L (ref 22–32)
Calcium: 9.2 mg/dL (ref 8.9–10.3)
Chloride: 100 mmol/L (ref 98–111)
Creatinine, Ser: 1.07 mg/dL — ABNORMAL HIGH (ref 0.44–1.00)
GFR calc Af Amer: 58 mL/min — ABNORMAL LOW (ref >60)
GFR calc non Af Amer: 50 mL/min — ABNORMAL LOW (ref >60)
Glucose, Bld: 114 mg/dL — ABNORMAL HIGH (ref 70–99)
Potassium: 3.9 mmol/L (ref 3.5–5.1)
Sodium: 136 mmol/L (ref 135–145)

## 2019-10-13 LAB — POC OCCULT BLOOD, ED: Fecal Occult Bld: POSITIVE — AB

## 2019-10-13 LAB — CBG MONITORING, ED: Glucose-Capillary: 88 mg/dL (ref 70–99)

## 2019-10-13 MED ORDER — SODIUM CHLORIDE 0.9% FLUSH
3.0000 mL | Freq: Once | INTRAVENOUS | Status: AC
  Administered 2019-10-13: 14:00:00 3 mL via INTRAVENOUS

## 2019-10-13 MED ORDER — SODIUM CHLORIDE 0.9 % IV BOLUS
1000.0000 mL | Freq: Once | INTRAVENOUS | Status: AC
  Administered 2019-10-13: 1000 mL via INTRAVENOUS

## 2019-10-13 NOTE — Telephone Encounter (Signed)
Pt's daughter calls office to report that pt isn't doing well s/p robotic assisted thoracotomy/left upper lobectomy on 09/11/19. She was admitted to the hospital at the end of January d/t nausea and dehydration, and dgt states pt is still not eating or drinking much and is extremely weak. She states pt had diarrhea this morning x 2. Reports that a significant amount of bright red blood was noted w/ first episode of diarrhea. Advised to take pt to the ED for evaluation.

## 2019-10-13 NOTE — ED Notes (Signed)
Patient verbalizes understanding of discharge instructions. Opportunity for questioning and answers were provided. Armband removed by staff, pt discharged from ED.  

## 2019-10-13 NOTE — Discharge Instructions (Signed)
If you develop further bleeding in your stools or from rectum, chest pain, shortness of breath, lightheadedness, dizziness, or any other new/concerning symptoms then return to the ER for evaluation.

## 2019-10-13 NOTE — ED Provider Notes (Signed)
[facility] EMERGENCY DEPARTMENT Provider Note   CSN: [id number] Arrival date & time: 10/13/19  1139     History Chief Complaint  Patient presents with  . Weakness  . GI Bleeding    [name] is a 79 y.o. female.  HPI 79 year old female presents with a chief complaint of blood in the stool.  Noticed it this morning with a bowel movement.  Seem to mostly be in the bowel movement and when she wiped.  No blood in the toilet otherwise or blood clots.  She had another bowel movement later today with no blood.  She feels generally weak but has been feeling so since last month since having chest surgery.  No abdominal pain.  No shortness of breath.  She has been having vomiting since the surgery nearly every day.  She has no appetite.  No recent diarrhea. She continues to have left sided chest pain since the surgery.   Past Medical History:  Diagnosis Date  . Adenocarcinoma of left breast 04/16/2012   Stage II (T2 N0 M0) left-sided adenocarcinoma breast diagnosed in 1990 by Dr. Romona Curls and treated by Dr. Juanita Craver on NSAB PPD-19 with CMF followed by 5 years of tamoxifen and she remains disease free thus far.   . Breast cancer ([place])    left breast/mastectomy/dx approx1990  . Gall bladder stones   . Grave's disease   . High triglycerides   . History of kidney stones    small stone  rt   . Nerve compression    pinched in neck  . Pneumonia 1990  . PONV (postoperative nausea and vomiting)     Patient Active Problem List   Diagnosis Date Noted  . Dehydration 09/29/2019  . Acute dehydration 09/29/2019  . Lung cancer ([place]) 09/11/2019  . Left upper lobe pulmonary nodule 09/11/2019  . Nodule of left lung 07/29/2019  . Clear cell renal cell carcinoma, left ([place]) 04/13/2019  . Renal mass 03/25/2019  . Left kidney mass 02/16/2019  . Adenocarcinoma of left breast 04/16/2012    Past Surgical History:  Procedure Laterality Date  . CARPAL TUNNEL RELEASE     rt.  hand  . cataract surgery     bilateral  . COLONOSCOPY    . EYE SURGERY    . LAPAROSCOPIC NEPHRECTOMY Left 03/25/2019   Procedure: LAPAROSCOPIC RADICAL NEPHRECTOMY;  Surgeon: Ceasar Mons, MD;  Location: [facility];  Service: Urology;  Laterality: Left;  Marland Kitchen MASTECTOMY     left.  Marland Kitchen NODE DISSECTION Left 09/11/2019   Procedure: Node Dissection;  Surgeon: [name], MD;  Location: [facility];  Service: Thoracic;  Laterality: Left;  . THORACOTOMY/LOBECTOMY Left 09/11/2019   Procedure: Thoracotomy/Lobectomy Left Upper Lobe;  Surgeon: [name], MD;  Location: [facility];  Service: Thoracic;  Laterality: Left;  . TONSILLECTOMY       OB History   No obstetric history on file.     Family History  Problem Relation Age of Onset  . Cancer Father        lung cancer  . Cancer Brother        colon cancer  . Cancer Other        lung cancer/died age 33    Social History   Tobacco Use  . Smoking status: Former Smoker    Packs/day: 1.00    Years: 60.00    Pack years: 60.00    Types: Cigarettes    Quit date: 09/22/2019  Years since quitting: 0.0  . Smokeless tobacco: Never Used  . Tobacco comment: attempted to stop but its expensive to quit  Substance Use Topics  . Alcohol use: No  . Drug use: No    Home Medications Prior to Admission medications   Medication Sig Start Date End Date Taking? Authorizing Provider  acetaminophen (TYLENOL) 500 MG tablet Take 500 mg by mouth every 6 (six) hours as needed for mild pain, moderate pain or headache.    Yes [provider]  aspirin 325 MG tablet Take 1 tablet (325 mg total) by mouth every 6 (six) hours as needed for moderate pain. Resume in 1 week Patient taking differently: Take 325 mg by mouth daily.  03/26/19  Yes Tharon Aquas, MD  hydroxypropyl methylcellulose / hypromellose (ISOPTO TEARS / GONIOVISC) 2.5 % ophthalmic solution Place 1 drop into both eyes 3 (three) times daily as needed for dry eyes.   Yes  [provider]  levothyroxine (SYNTHROID, LEVOTHROID) 100 MCG tablet Take 100 mcg by mouth daily before breakfast.  04/04/18  Yes [provider]  meclizine (ANTIVERT) 50 MG tablet Take 25 mg by mouth 3 (three) times daily as needed for dizziness.    Yes [provider]  Multiple Vitamins-Minerals (CENTRUM SILVER ULTRA WOMENS) TABS Take 1 tablet by mouth daily.    Yes [provider]  ondansetron (ZOFRAN) 4 MG tablet Take 1 tablet (4 mg total) by mouth every 8 (eight) hours as needed for up to 15 days for nausea or vomiting. 10/01/19 10/16/19 Yes Roddenberry, Myron G, PA-C  simvastatin (ZOCOR) 20 MG tablet Take 20 mg by mouth at bedtime.     Yes [provider]    Allergies    Chantix [varenicline]  Review of Systems   Review of Systems  Constitutional: Positive for fatigue. Negative for fever.  Respiratory: Negative for shortness of breath.   Cardiovascular: Positive for chest pain.  Gastrointestinal: Positive for blood in stool and vomiting. Negative for abdominal pain.  Neurological: Negative for headaches.  All other systems reviewed and are negative.   Physical Exam Updated Vital Signs BP (!) 123/59   Pulse 89   Temp 97.7 F (36.5 C) (Oral)   Resp (!) 23   SpO2 100%   Physical Exam Vitals and nursing note reviewed.  Constitutional:      Appearance: She is well-developed.  HENT:     Head: Normocephalic and atraumatic.     Right Ear: External ear normal.     Left Ear: External ear normal.     Nose: Nose normal.  Eyes:     General:        Right eye: No discharge.        Left eye: No discharge.  Cardiovascular:     Rate and Rhythm: Normal rate and regular rhythm.     Heart sounds: Normal heart sounds.  Pulmonary:     Effort: Pulmonary effort is normal.     Breath sounds: Normal breath sounds.  Chest:     Chest wall: Tenderness present.  Abdominal:     General: There is no distension.     Palpations: Abdomen is soft.      Tenderness: There is no abdominal tenderness.  Genitourinary:    Comments: No internal/external hemorrhoids. No tenderness or masses. No gross blood.  Skin:    General: Skin is warm and dry.  Neurological:     Mental Status: She is alert.     Comments: CN 3-12 grossly  intact. 5/5 strength in all 4 extremities. Grossly normal sensation. Normal finger to nose.   Psychiatric:        Mood and Affect: Mood is not anxious.     ED Results / Procedures / Treatments   Labs (all labs ordered are listed, but only abnormal results are displayed) Labs Reviewed  BASIC METABOLIC PANEL - Abnormal; Notable for the following components:      Result Value   Glucose, Bld 114 (*)    Creatinine, Ser 1.07 (*)    GFR calc non Af Amer 50 (*)    GFR calc Af Amer 58 (*)    All other components within normal limits  CBC - Abnormal; Notable for the following components:   RBC 3.01 (*)    Hemoglobin 8.8 (*)    HCT 28.8 (*)    Platelets 487 (*)    All other components within normal limits  POC OCCULT BLOOD, ED - Abnormal; Notable for the following components:   Fecal Occult Bld POSITIVE (*)    All other components within normal limits  URINALYSIS, ROUTINE W REFLEX MICROSCOPIC  CBG MONITORING, ED    EKG None  Radiology DG Chest Portable 1 View  Result Date: 10/13/2019 CLINICAL DATA:  Chest pain. Recent left thoracotomy. EXAM: PORTABLE CHEST 1 VIEW COMPARISON:  Radiographs 10/01/2019 and 09/30/2019. CT 07/08/2019. FINDINGS: 1336 hours. Stable volume loss in the left hemithorax status post left upper lobe resection. There are probable surgical clips medially at the left lung base. The lungs are clear. There is no pleural effusion or pneumothorax. The heart size and mediastinal contours appear stable. Chronic left axillary surgical clips and previous left mastectomy noted. No acute osseous findings. IMPRESSION: Stable postoperative chest. No acute cardiopulmonary process. Electronically Signed   By: Richardean Sale M.D.   On: 10/13/2019 13:50    Procedures Procedures (including critical care time)  Medications Ordered in ED Medications  sodium chloride flush (NS) 0.9 % injection 3 mL (3 mLs Intravenous Given 10/13/19 1400)  sodium chloride 0.9 % bolus 1,000 mL (0 mLs Intravenous Stopped 10/13/19 1507)    ED Course  I have reviewed the triage vital signs and the nursing notes.  Pertinent labs & imaging results that were available during my care of the patient were reviewed by me and considered in my medical decision making (see chart for details).    MDM Rules/Calculators/A&P                      Patient is a little tachycardic with some soft blood pressures but no hypotension.  I think this is from poor p.o. intake/dehydration rather than severe GI bleeding.  She has had one episode with blood in the stool.  At the time of discharge this has now been about 10 hours since the episode with a normal bowel movement in between.  Discussed options with patient and she prefers to go home which I think is pretty reasonable.  She has low hemoglobin at baseline but it is not particularly different today.  We discussed tricked return precautions and outpatient GI follow-up. Final Clinical Impression(s) / ED Diagnoses Final diagnoses:  Bloody stool  Generalized weakness    Rx / DC Orders ED Discharge Orders    None       Sherwood Gambler, MD 10/13/19 [phone]

## 2019-10-13 NOTE — ED Triage Notes (Signed)
Pt had lung surgery (removal of cancerous spots) in January and has not recovered well since. Pt has become increasingly lethargic/weak. Today had an episode of bloody stool at 5am. Second bowel movement did not have blood. Pt is alert and oriented. Denies SOB/CP

## 2019-10-19 ENCOUNTER — Encounter: Payer: Self-pay | Admitting: Internal Medicine

## 2019-10-19 ENCOUNTER — Other Ambulatory Visit: Payer: Self-pay | Admitting: Thoracic Surgery (Cardiothoracic Vascular Surgery)

## 2019-10-19 ENCOUNTER — Other Ambulatory Visit: Payer: Medicare PPO | Admitting: Internal Medicine

## 2019-10-19 DIAGNOSIS — Z515 Encounter for palliative care: Secondary | ICD-10-CM

## 2019-10-19 DIAGNOSIS — C3490 Malignant neoplasm of unspecified part of unspecified bronchus or lung: Secondary | ICD-10-CM

## 2019-10-19 DIAGNOSIS — Z7189 Other specified counseling: Secondary | ICD-10-CM

## 2019-10-19 NOTE — Progress Notes (Signed)
Feb 15th, 2021 [facility] Palliative Care Consult Note Telephone: [phone]  Fax: [phone]   Due to the current COVID-19 infection/crises, the family prefer, and have given their verbal consent for, a provider visit via telemedicine. HIPPA policies of confidentially were discussed. Video-audio (telehealth) contact was unable to be done due technical barriers from the patient's side.   PATIENT NAME: [name] DOB: 12-14-1940 MRN: [id number] [address] [place] [address]. Phone: [phone]   PRIMARY CARE PROVIDER:    Alanson Puls, [facility] Dr. Jani Gravel [address] [place],  [place] [address] [place] Charon ([place]) f/u feb 16 Sreedhar Katragadda ([place]) [place] Feb 22nd . Plan of therapy.      REFERRING PROVIDER:  Dr. Jani Gravel    RESPONSIBLE PARTY: [name] (daughter) Jerilynn Mages) [phone]  *Call this number for Consult* [email or web address] Abigail Butts (daughter) [phone] (M)      ASSESSMENT / RECOMMENDATIONS:  1. This is a f/u telephonic visit at the request of daughter [name] for discussion of Goals of Care. [name] sent me an earlier e-mail, reporting that patient transiently did better post recent ER visit 6 days earlier (treated for dehydration with IVFs).  For a few days patient was eating better, but now consuming just sips and bites. She continues weak with bouts of nausea and dizziness. [name] is anxious regarding long term options. [name] has had to request a fourth extension to her FMLA which will be a total of 7 weeks. [name] is worried that this might place her job in jeopardy; she is wondering about patient placement at a rehab facility.   I resourced our [place]. Since it's been less than 1 month since patient hospitalization 1/26-1/28/2021 she may be eligible for skilled days. [facility] is not too far from patient's home. [facility] can assist with filling out FL-2, for  PCP's signature. I encouraged [name] to visit [facility] to asses, and talk to their admission's person to check for bed status.   [name] will discuss this with her sister Abigail Butts and with patient and give me a call within the next day or so. [name] mentioned that patient needs 24/7 supervision. If rehab option doesn't work, we talked about possibility of taking patient to [name] or [facility] home, and hiring help during the times that they are working.    2. Follow up Palliative Care Visit: Thursday March 11th at noon; tele-health telephone call   I spent 30 minutes providing this consultation from 4pm to 4:30pm. More than 50% of the time in this consultation was spent coordinating communication.    HISTORY OF PRESENT ILLNESS:  [name] is a 79 y.o. female with history of tobacco abuse, renal cell carcinoma (adrenal sparing nephrectomy July 2020), hypothyroidism (post treatment of Graves disease), hypertriglyceridemia, stage II adenocarcinoma L breast (L mastectomy 1990), and a stage IIb squamous cell carcinoma of the left upper lobe (LU lobectomy 09/11/2019. Pigtail catheter).  1/8-1/19/2021: L U lobectomy; post of air leak. Discharged with pigtail cath to mini PleurEvac 1/26-1/28/2021 hospitalized for dehydration. L pigtail catheter removed 09/30/19. 10/13/2019 ER visit treated with IVFs for dehydration. Had episode bloody stool; recom OP GI consult. This is a f/u palliative care visit from Feb 3rd.   CODE STATUS: full code   PPS: 50%   HOSPICE ELIGIBILITY/DIAGNOSIS: TBD  PAST MEDICAL HISTORY:  Past Medical History:  Diagnosis Date  . Adenocarcinoma of left breast 04/16/2012   Stage  II (T2 N0 M0) left-sided adenocarcinoma breast diagnosed in 1990 by Dr. Romona Curls and treated by Dr. Juanita Craver on NSAB PPD-19 with CMF followed by 5 years of tamoxifen and she remains disease free thus far.   . Breast cancer ([place])    left breast/mastectomy/dx approx1990  . Gall bladder stones   . Grave's disease    . High triglycerides   . History of kidney stones    small stone  rt   . Nerve compression    pinched in neck  . Pneumonia 1990  . PONV (postoperative nausea and vomiting)     SOCIAL HX:  Social History   Tobacco Use  . Smoking status: Former Smoker    Packs/day: 1.00    Years: 60.00    Pack years: 60.00    Types: Cigarettes    Quit date: 09/22/2019    Years since quitting: 0.0  . Smokeless tobacco: Never Used  . Tobacco comment: attempted to stop but its expensive to quit  Substance Use Topics  . Alcohol use: No    ALLERGIES:  Allergies  Allergen Reactions  . Chantix [Varenicline]     depression     PERTINENT MEDICATIONS:  Outpatient Encounter Medications as of 10/19/2019  Medication Sig  . acetaminophen (TYLENOL) 500 MG tablet Take 500 mg by mouth every 6 (six) hours as needed for mild pain, moderate pain or headache.   Marland Kitchen aspirin 325 MG tablet Take 1 tablet (325 mg total) by mouth every 6 (six) hours as needed for moderate pain. Resume in 1 week (Patient taking differently: Take 325 mg by mouth daily. )  . hydroxypropyl methylcellulose / hypromellose (ISOPTO TEARS / GONIOVISC) 2.5 % ophthalmic solution Place 1 drop into both eyes 3 (three) times daily as needed for dry eyes.  Marland Kitchen levothyroxine (SYNTHROID, LEVOTHROID) 100 MCG tablet Take 100 mcg by mouth daily before breakfast.   . meclizine (ANTIVERT) 50 MG tablet Take 25 mg by mouth 3 (three) times daily as needed for dizziness.   . Multiple Vitamins-Minerals (CENTRUM SILVER ULTRA WOMENS) TABS Take 1 tablet by mouth daily.   . simvastatin (ZOCOR) 20 MG tablet Take 20 mg by mouth at bedtime.     No facility-administered encounter medications on file as of 10/19/2019.    PHYSICAL EXAM:   PE deferred d/t telehealth telephonic nature of visit, with patient's daughter to discuss Goals of [place], NP

## 2019-10-20 ENCOUNTER — Other Ambulatory Visit: Payer: Self-pay

## 2019-10-20 ENCOUNTER — Ambulatory Visit
Admission: RE | Admit: 2019-10-20 | Discharge: 2019-10-20 | Disposition: A | Discharge status: Home / Self Care | Payer: Medicare PPO | Source: Ambulatory Visit | Attending: Thoracic Surgery (Cardiothoracic Vascular Surgery) | Admitting: Thoracic Surgery (Cardiothoracic Vascular Surgery)

## 2019-10-20 ENCOUNTER — Ambulatory Visit (INDEPENDENT_AMBULATORY_CARE_PROVIDER_SITE_OTHER): Payer: Self-pay | Admitting: Thoracic Surgery (Cardiothoracic Vascular Surgery)

## 2019-10-20 ENCOUNTER — Encounter: Payer: Self-pay | Admitting: Thoracic Surgery (Cardiothoracic Vascular Surgery)

## 2019-10-20 VITALS — BP 93/62 | HR 107 | Temp 97.7°F | Resp 16 | Ht 65.0 in | Wt 110.2 lb

## 2019-10-20 DIAGNOSIS — C50912 Malignant neoplasm of unspecified site of left female breast: Secondary | ICD-10-CM

## 2019-10-20 DIAGNOSIS — Z902 Acquired absence of lung [part of]: Secondary | ICD-10-CM

## 2019-10-20 DIAGNOSIS — C3412 Malignant neoplasm of upper lobe, left bronchus or lung: Secondary | ICD-10-CM

## 2019-10-20 DIAGNOSIS — C3490 Malignant neoplasm of unspecified part of unspecified bronchus or lung: Secondary | ICD-10-CM

## 2019-10-20 MED ORDER — PREDNISONE 10 MG (21) PO TBPK: ORAL_TABLET | ORAL | 0 refills | Status: AC

## 2019-10-20 MED ORDER — MEGESTROL ACETATE 40 MG PO TABS: 80.0000 mg | ORAL_TABLET | Freq: Every day | ORAL | 0 refills | Status: DC

## 2019-10-20 MED ORDER — MIRTAZAPINE 15 MG PO TABS: 15.0000 mg | ORAL_TABLET | Freq: Every day | ORAL | 0 refills | Status: DC

## 2019-10-20 NOTE — Progress Notes (Signed)
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  She underwent a left upper lobectomy on 09/11/2019.  She had a node invading the main pulmonary artery and had to be converted to a thoracotomy.  She had an air leak postoperatively ultimately went home with a pigtail catheter in place.  She returns the office on 09/24/2019 with poor appetite and p.o. intake and complaints of dizziness.  She appeared dehydrated but refused hospital admission.  She then returned to the office on 09/29/2019 and was having dry heaves and nausea.  She was admitted for IV hydration.  She improved over the next couple of days and went home on the 28th.  She went to the emergency room on 10/13/2019 after noticing some bloody stool.  She was continued to have problems with nausea and poor oral intake.  She was evaluated there and then discharged home.  Her oral intake remains poor.  She hardly eats or drinks anything per her daughter's report.  She complains of pain in the left rib cage.  She is taking Tylenol for that.  She also complains of sinus drainage which she thinks is contributing to her nausea.  She has been taking Zofran for nausea but without much relief.  Past Medical History:  Diagnosis Date  . Adenocarcinoma of left breast 04/16/2012   Stage II (T2 N0 M0) left-sided adenocarcinoma breast diagnosed in 1990 by Dr. Romona Curls and treated by Dr. Juanita Craver on NSAB PPD-19 with CMF followed by 5 years of tamoxifen and she remains disease free thus far.   . Breast cancer ([place])    left breast/mastectomy/dx approx1990  . Gall bladder stones   . Grave's disease   . High triglycerides   . History of kidney  stones    small stone  rt   . Nerve compression    pinched in neck  . Pneumonia 1990  . PONV (postoperative nausea and vomiting)     Current Outpatient Medications  Medication Sig Dispense Refill  . acetaminophen (TYLENOL) 500 MG tablet Take 500 mg by mouth every 6 (six) hours as needed for mild pain, moderate pain or headache.     Marland Kitchen aspirin 325 MG tablet Take 1 tablet (325 mg total) by mouth every 6 (six) hours as needed for moderate pain. Resume in 1 week (Patient taking differently: Take 325 mg by mouth daily. )    . hydroxypropyl methylcellulose / hypromellose (ISOPTO TEARS / GONIOVISC) 2.5 % ophthalmic solution Place 1 drop into both eyes 3 (three) times daily as needed for dry eyes.    Marland Kitchen levothyroxine (SYNTHROID, LEVOTHROID) 100 MCG tablet Take 100 mcg by mouth daily before breakfast.     . meclizine (ANTIVERT) 50 MG tablet Take 25 mg by mouth 3 (three) times daily as needed for dizziness.     . Multiple Vitamins-Minerals (CENTRUM SILVER ULTRA WOMENS) TABS Take 1 tablet by mouth daily.     . simvastatin (ZOCOR) 20 MG tablet Take 20 mg by mouth at bedtime.      . megestrol (MEGACE) 40 MG tablet Take 2 tablets (80 mg total) by  mouth daily. 30 tablet 0  . mirtazapine (REMERON) 15 MG tablet Take 1 tablet (15 mg total) by mouth at bedtime. 30 tablet 0  . predniSONE (STERAPRED UNI-PAK 21 TAB) 10 MG (21) TBPK tablet Take 6 tablets (60 mg total) by mouth daily for 1 day, THEN 5 tablets (50 mg total) daily for 1 day, THEN 4 tablets (40 mg total) daily for 1 day, THEN 3 tablets (30 mg total) daily for 1 day, THEN 2 tablets (20 mg total) daily for 1 day, THEN 1 tablet (10 mg total) daily for 1 day. 21 tablet 0   No current facility-administered medications for this visit.    Physical Exam BP 93/62 (BP Location: Right Arm, Patient Position: Sitting, Cuff Size: Normal)   Pulse (!) 107   Temp 97.7 F (36.5 C)   Resp 16   Ht 5\' 5"  (1.651 m)   Wt 110 lb 2.4 oz (50 kg)   SpO2 96% Comment: RA   BMI 18.34 kg/m  79 year old woman chronically ill-appearing but in no acute distress ill-appearing but in no acute distress Flat affect Slow psychomotor response Lungs diminished at left base, otherwise clear Cardiac mildly tachycardic, regular Incisions well-healed  Diagnostic Tests: CHEST - 2 VIEW  COMPARISON:  10/13/2019  FINDINGS: Volume loss left hemithorax compatible with history of left upper lobectomy. The lungs are clear without focal pneumonia, edema, pneumothorax or pleural effusion. The cardiopericardial silhouette is within normal limits for size. The visualized bony structures of the thorax are intact.  IMPRESSION: No active cardiopulmonary disease.   Electronically Signed   By: Misty Stanley M.D.   On: 10/20/2019 16:26  I personally reviewed the chest x-ray images and concur with the findings noted above  Impression: [name] is a 79 year old woman with a history of tobacco abuse, renal cell carcinoma, nephrectomy 6 months ago, and stage IIb non-small cell carcinoma of the left upper lobe status post left upper lobectomy.  Postop course was initially complicated by prolonged air leak.  That eventually resolved and her tube was removed.  Much more problematic is been general failure to thrive.  She has a poor appetite, marginal oral intake, and nausea.  She has a flat affect and appears depressed.  I doubt that her sinus drainage is a contributor to her problems, but she can take over-the-counter medication if she wishes.  I went to try Megace to see if that will be an appetite stimulant for her.  I am also going to give her a prednisone taper.  Hopefully that will give her a little boost of energy and maybe stimulate her appetite a little.  Also, start her on Remeron 15 mg p.o. nightly to see if that helps at all.  Plan: She has an appoint with Dr. Delton Coombes next Monday.  I advised her to keep that appointment. I will plan to see her back in 2 weeks to check on her progress  [name], MD [facility] [phone]

## 2019-10-26 ENCOUNTER — Inpatient Hospital Stay (HOSPITAL_COMMUNITY): Payer: Medicare PPO | Attending: Hematology | Admitting: Hematology

## 2019-10-26 ENCOUNTER — Other Ambulatory Visit: Payer: Self-pay

## 2019-10-26 ENCOUNTER — Encounter (HOSPITAL_COMMUNITY): Payer: Self-pay | Admitting: Hematology

## 2019-10-26 VITALS — BP 128/74 | HR 111 | Temp 97.0°F | Resp 20 | Wt 111.1 lb

## 2019-10-26 DIAGNOSIS — R630 Anorexia: Secondary | ICD-10-CM | POA: Diagnosis not present

## 2019-10-26 DIAGNOSIS — Z801 Family history of malignant neoplasm of trachea, bronchus and lung: Secondary | ICD-10-CM | POA: Insufficient documentation

## 2019-10-26 DIAGNOSIS — Z9012 Acquired absence of left breast and nipple: Secondary | ICD-10-CM | POA: Insufficient documentation

## 2019-10-26 DIAGNOSIS — Z170 Estrogen receptor positive status [ER+]: Secondary | ICD-10-CM | POA: Diagnosis not present

## 2019-10-26 DIAGNOSIS — Z853 Personal history of malignant neoplasm of breast: Secondary | ICD-10-CM | POA: Insufficient documentation

## 2019-10-26 DIAGNOSIS — Z902 Acquired absence of lung [part of]: Secondary | ICD-10-CM | POA: Diagnosis not present

## 2019-10-26 DIAGNOSIS — Z905 Acquired absence of kidney: Secondary | ICD-10-CM | POA: Diagnosis not present

## 2019-10-26 DIAGNOSIS — Z87891 Personal history of nicotine dependence: Secondary | ICD-10-CM | POA: Diagnosis not present

## 2019-10-26 DIAGNOSIS — Z800 Family history of malignant neoplasm of digestive organs: Secondary | ICD-10-CM | POA: Diagnosis not present

## 2019-10-26 DIAGNOSIS — Z79899 Other long term (current) drug therapy: Secondary | ICD-10-CM | POA: Diagnosis not present

## 2019-10-26 DIAGNOSIS — R911 Solitary pulmonary nodule: Secondary | ICD-10-CM

## 2019-10-26 DIAGNOSIS — C3412 Malignant neoplasm of upper lobe, left bronchus or lung: Secondary | ICD-10-CM | POA: Diagnosis not present

## 2019-10-26 DIAGNOSIS — R531 Weakness: Secondary | ICD-10-CM

## 2019-10-26 DIAGNOSIS — Z85528 Personal history of other malignant neoplasm of kidney: Secondary | ICD-10-CM | POA: Diagnosis not present

## 2019-10-26 DIAGNOSIS — Z7982 Long term (current) use of aspirin: Secondary | ICD-10-CM | POA: Diagnosis not present

## 2019-10-26 NOTE — Patient Instructions (Signed)
[place] at [facility] Discharge Instructions  You were seen today by Dr. Delton Coombes. He went over your recent test results. He will see you back in 2 weeks for labs and follow up.   Thank you for choosing [place] at [facility] to provide your oncology and hematology care.  To afford each patient quality time with our provider, please arrive at least 15 minutes before your scheduled appointment time.   If you have a lab appointment with the [facility] please come in thru the  Main Entrance and check in at the main information desk  You need to re-schedule your appointment should you arrive 10 or more minutes late.  We strive to give you quality time with our providers, and arriving late affects you and other patients whose appointments are after yours.  Also, if you no show three or more times for appointments you may be dismissed from the clinic at the providers discretion.     Again, thank you for choosing [facility].  Our hope is that these requests will decrease the amount of time that you wait before being seen by our physicians.       _____________________________________________________________  Should you have questions after your visit to [facility], please contact our office at [phone] between the hours of 8:00 a.m. and 4:30 p.m.  Voicemails left after 4:00 p.m. will not be returned until the following business day.  For prescription refill requests, have your pharmacy contact our office and allow 72 hours.    Cancer Center Support Programs:   > Cancer Support Group  2nd Tuesday of the month 1pm-2pm, Journey Room

## 2019-10-26 NOTE — Assessment & Plan Note (Addendum)
1.  Stage IIb (T1BN1) squamous cell carcinoma of the left upper lobe of the lung: -Left upper lobectomy on 09/11/2019 showing 1.5 cm moderately differentiated squamous cell carcinoma, margins negative, LVI positive, 1+ lymph node out of 25, PT1bPN1 -She has been feeling very weak.  She was evaluated by Dr. Roxan Hockey last Tuesday and was started on taper dose of prednisone.  She was also started on mirtazapine 15 mg at bedtime and Megace 80 mg daily. -She had eaten 2 meals for the first time since surgery yesterday.  She is also feeling better today with regards to energy and was able to dress herself.  She has 2 more days of prednisone. -We discussed her new diagnosis and pathology report in detail.  She will need adjuvant chemotherapy based on NCCN guidelines. -Usual recommendation is to give 4 cycles of adjuvant chemotherapy with carboplatin and paclitaxel.  However she is too weak at this time.  I will reevaluate her in 2 weeks. -I have sent referral for home physical therapy.  She was encouraged to eat better and do some exercises at home.  2.  Stage III (pT3a, pNx) clear-cell renal cell carcinoma: -Adrenal sparing laparoscopic left neck colectomy on 03/25/2019 by Dr.Winter. -Pathology showed grade 2 clear cell renal cell carcinoma, 5.8 cm, tumor invading anterior perinephric fat.  Negative sarcomatoid, rhabdoid features and tumor necrosis.  Margins negative. -Most recent PET scan in November 2020 did not show any evidence of metastatic disease. -Needs surveillance visits every 3 to 6 months for 3 years followed by annually up to 5 years.  CT CAP every 3 to 6 months for the first 3 years and then annually up to 5 years.    3.  Stage II (T2 N0 M0) left breast cancer: -Status post left mastectomy, in 1990 followed by chemotherapy. -Right breast mammogram on 02/05/2019 was BI-RADS Category 1.  4.  Lack of appetite: -She has experienced improvement in appetite since she was started on prednisone.   She was able to eat 2 meals yesterday. -She is also taking Megace 80 mg daily and Restoril 15 mg at bedtime. -I plan to titrate up Restoril as needed.

## 2019-10-26 NOTE — Progress Notes (Signed)
[place], [place] [address]   CLINIC:  Medical Oncology/Hematology  PCP:  Alanson Puls, [facility] [place] [address] [phone]   REASON FOR VISIT:  Newly diagnosed left lung squamous cell carcinoma.     CANCER STAGING: Cancer Staging Adenocarcinoma of left breast Staging form: Breast, AJCC 7th Edition - Clinical: Stage IIA (T2, N0, cM0) - Signed by Baird Cancer, PA on 04/16/2012  Clear cell renal cell carcinoma, left (HCC) Staging form: Kidney, AJCC 8th Edition - Clinical: Stage III (cT3a, cNX, cM0) - Signed by Derek Jack, MD on 04/13/2019  Lung cancer [facility]) Staging form: Lung, AJCC 8th Edition - Clinical stage from 10/26/2019: Stage IIB (cT1b, cN1, cM0) - Signed by Derek Jack, MD on 10/26/2019    INTERVAL HISTORY:  Ms. Vicencio 79 y.o. female seen for follow-up for her newly diagnosed left lung cancer.  She underwent left upper lobectomy on 09/11/2019.  She reports that her energy levels are staying low since then.  Appetite and energy levels are reported as 25%.  She reported some pain in the left side of the chest which is improving.  She also reported easy shortness of breath on exertion.  She has mild nausea but denied any vomiting.  She was evaluated by Dr. Roxan Hockey last week.  She was put on 7-day course of prednisone tapering.  She has 2 more days left.  She is also started on Megace 80 mg daily.  She is also started on Restoril 15 mg at bedtime.  She is not moving much at home.  She does not have any physical therapy at home.  However palliative nurse reportedly gave her some handouts of exercises to be done.  Patient reports that she does not have energy to do them.  REVIEW OF SYSTEMS:  Review of Systems  Constitutional: Positive for fatigue.  Respiratory: Positive for shortness of breath.   Gastrointestinal: Positive for nausea.  Neurological: Positive for dizziness.  All other systems  reviewed and are negative.    PAST MEDICAL/SURGICAL HISTORY:  Past Medical History:  Diagnosis Date  . Adenocarcinoma of left breast 04/16/2012   Stage II (T2 N0 M0) left-sided adenocarcinoma breast diagnosed in 1990 by Dr. Romona Curls and treated by Dr. Juanita Craver on NSAB PPD-19 with CMF followed by 5 years of tamoxifen and she remains disease free thus far.   . Breast cancer ([place])    left breast/mastectomy/dx approx1990  . Gall bladder stones   . Grave's disease   . High triglycerides   . History of kidney stones    small stone  rt   . Nerve compression    pinched in neck  . Pneumonia 1990  . PONV (postoperative nausea and vomiting)    Past Surgical History:  Procedure Laterality Date  . CARPAL TUNNEL RELEASE     rt. hand  . cataract surgery     bilateral  . COLONOSCOPY    . EYE SURGERY    . LAPAROSCOPIC NEPHRECTOMY Left 03/25/2019   Procedure: LAPAROSCOPIC RADICAL NEPHRECTOMY;  Surgeon: Ceasar Mons, MD;  Location: [facility];  Service: Urology;  Laterality: Left;  Marland Kitchen MASTECTOMY     left.  Marland Kitchen NODE DISSECTION Left 09/11/2019   Procedure: Node Dissection;  Surgeon: [name], MD;  Location: [facility];  Service: Thoracic;  Laterality: Left;  . THORACOTOMY/LOBECTOMY Left 09/11/2019   Procedure: Thoracotomy/Lobectomy Left Upper Lobe;  Surgeon: [name], MD;  [facility];  Service: Thoracic;  Laterality: Left;  . TONSILLECTOMY       SOCIAL HISTORY:  Social History   Socioeconomic History  . Marital status: Divorced    Spouse name: Not on file  . Number of children: Not on file  . Years of education: Not on file  . Highest education level: Not on file  Occupational History  . Not on file  Tobacco Use  . Smoking status: Former Smoker    Packs/day: 1.00    Years: 60.00    Pack years: 60.00    Types: Cigarettes    Quit date: 09/22/2019    Years since quitting: 0.0  . Smokeless tobacco: Never Used  . Tobacco comment: attempted to stop but  its expensive to quit  Substance and Sexual Activity  . Alcohol use: No  . Drug use: No  . Sexual activity: Not on file  Other Topics Concern  . Not on file  Social History Narrative  . Not on file   Social Determinants of Health   Financial Resource Strain:   . Difficulty of Paying Living Expenses: Not on file  Food Insecurity:   . Worried About Charity fundraiser in the Last Year: Not on file  . Ran Out of Food in the Last Year: Not on file  Transportation Needs:   . Lack of Transportation (Medical): Not on file  . Lack of Transportation (Non-Medical): Not on file  Physical Activity:   . Days of Exercise per Week: Not on file  . Minutes of Exercise per Session: Not on file  Stress:   . Feeling of Stress : Not on file  Social Connections:   . Frequency of Communication with Friends and Family: Not on file  . Frequency of Social Gatherings with Friends and Family: Not on file  . Attends Religious Services: Not on file  . Active Member of Clubs or Organizations: Not on file  . Attends Archivist Meetings: Not on file  . Marital Status: Not on file  Intimate Partner Violence:   . Fear of Current or Ex-Partner: Not on file  . Emotionally Abused: Not on file  . Physically Abused: Not on file  . Sexually Abused: Not on file    FAMILY HISTORY:  Family History  Problem Relation Age of Onset  . Cancer Father        lung cancer  . Cancer Brother        colon cancer  . Cancer Other        lung cancer/died age 75    CURRENT MEDICATIONS:  Outpatient Encounter Medications as of 10/26/2019  Medication Sig  . aspirin 325 MG tablet Take 1 tablet (325 mg total) by mouth every 6 (six) hours as needed for moderate pain. Resume in 1 week (Patient taking differently: Take 325 mg by mouth daily. )  . hydroxypropyl methylcellulose / hypromellose (ISOPTO TEARS / GONIOVISC) 2.5 % ophthalmic solution Place 1 drop into both eyes 3 (three) times daily as needed for dry eyes.  Marland Kitchen  levothyroxine (SYNTHROID, LEVOTHROID) 100 MCG tablet Take 100 mcg by mouth daily before breakfast.   . megestrol (MEGACE) 40 MG tablet Take 2 tablets (80 mg total) by mouth daily.  . mirtazapine (REMERON) 15 MG tablet Take 1 tablet (15 mg total) by mouth at bedtime.  . Multiple Vitamins-Minerals (CENTRUM SILVER ULTRA WOMENS) TABS Take 1 tablet by mouth daily.   . predniSONE (STERAPRED UNI-PAK 21 TAB) 10 MG (21) TBPK tablet  Take 6 tablets (60 mg total) by mouth daily for 1 day, THEN 5 tablets (50 mg total) daily for 1 day, THEN 4 tablets (40 mg total) daily for 1 day, THEN 3 tablets (30 mg total) daily for 1 day, THEN 2 tablets (20 mg total) daily for 1 day, THEN 1 tablet (10 mg total) daily for 1 day.  . simvastatin (ZOCOR) 20 MG tablet Take 20 mg by mouth at bedtime.    Marland Kitchen acetaminophen (TYLENOL) 500 MG tablet Take 500 mg by mouth every 6 (six) hours as needed for mild pain, moderate pain or headache.   . meclizine (ANTIVERT) 50 MG tablet Take 25 mg by mouth 3 (three) times daily as needed for dizziness.    No facility-administered encounter medications on file as of 10/26/2019.    ALLERGIES:  Allergies  Allergen Reactions  . Chantix [Varenicline]     depression     PHYSICAL EXAM:  ECOG Performance status: 1  Vitals:   10/26/19 1036  BP: 128/74  Pulse: (!) 111  Resp: 20  Temp: (!) 97 F (36.1 C)  SpO2: 100%   Filed Weights   10/26/19 1036  Weight: 111 lb 1.6 oz (50.4 kg)    Physical Exam Vitals reviewed.  Constitutional:      Appearance: Normal appearance.  Cardiovascular:     Rate and Rhythm: Normal rate and regular rhythm.     Heart sounds: Normal heart sounds.  Pulmonary:     Effort: Pulmonary effort is normal.     Breath sounds: Normal breath sounds.  Abdominal:     General: There is no distension.     Palpations: Abdomen is soft. There is no mass.  Musculoskeletal:        General: No swelling.  Neurological:     General: No focal deficit present.     Mental  Status: She is alert and oriented to person, place, and time.  Psychiatric:        Mood and Affect: Mood normal.        Behavior: Behavior normal.      LABORATORY DATA:  I have reviewed the labs as listed.  CBC    Component Value Date/Time   WBC 8.2 10/13/2019 1151   RBC 3.01 (L) 10/13/2019 1151   HGB 8.8 (L) 10/13/2019 1151   HCT 28.8 (L) 10/13/2019 1151   PLT 487 (H) 10/13/2019 1151   MCV 95.7 10/13/2019 1151   MCH 29.2 10/13/2019 1151   MCHC 30.6 10/13/2019 1151   RDW 13.2 10/13/2019 1151   LYMPHSABS 2.2 07/08/2019 0958   MONOABS 0.5 07/08/2019 0958   EOSABS 0.3 07/08/2019 0958   BASOSABS 0.1 07/08/2019 0958   CMP Latest Ref Rng & Units 10/13/2019 09/30/2019 09/29/2019  Glucose 70 - 99 mg/dL 114(H) 117(H) 139(H)  BUN 8 - 23 mg/dL 17 22 24(H)  Creatinine 0.44 - 1.00 mg/dL 1.07(H) 0.80 0.92  Sodium 135 - 145 mmol/L 136 135 137  Potassium 3.5 - 5.1 mmol/L 3.9 4.4 4.3  Chloride 98 - 111 mmol/L 100 105 105  CO2 22 - 32 mmol/L 23 22 22   Calcium 8.9 - 10.3 mg/dL 9.2 8.4(L) 8.6(L)  Total Protein 6.5 - 8.1 g/dL - - -  Total Bilirubin 0.3 - 1.2 mg/dL - - -  Alkaline Phos 38 - 126 U/L - - -  AST 15 - 41 U/L - - -  ALT 0 - 44 U/L - - -       DIAGNOSTIC IMAGING:  I  have independently reviewed the scans and discussed with the patient.   I have reviewed Venita Lick LPN's note and agree with the documentation.  I personally performed a face-to-face visit, made revisions and my assessment and plan is as follows.    ASSESSMENT & PLAN:   Lung cancer ([place]) 1.  Stage IIb (T1BN1) squamous cell carcinoma of the left upper lobe of the lung: -Left upper lobectomy on 09/11/2019 showing 1.5 cm moderately differentiated squamous cell carcinoma, margins negative, LVI positive, 1+ lymph node out of 25, PT1bPN1 -She has been feeling very weak.  She was evaluated by Dr. Roxan Hockey last Tuesday and was started on taper dose of prednisone.  She was also started on mirtazapine 15 mg at  bedtime and Megace 80 mg daily. -She had eaten 2 meals for the first time since surgery yesterday.  She is also feeling better today with regards to energy and was able to dress herself.  She has 2 more days of prednisone. -We discussed her new diagnosis and pathology report in detail.  She will need adjuvant chemotherapy based on NCCN guidelines. -Usual recommendation is to give 4 cycles of adjuvant chemotherapy with carboplatin and paclitaxel.  However she is too weak at this time.  I will reevaluate her in 2 weeks. -I have sent referral for home physical therapy.  She was encouraged to eat better and do some exercises at home.  2.  Stage III (pT3a, pNx) clear-cell renal cell carcinoma: -Adrenal sparing laparoscopic left neck colectomy on 03/25/2019 by Dr.Winter. -Pathology showed grade 2 clear cell renal cell carcinoma, 5.8 cm, tumor invading anterior perinephric fat.  Negative sarcomatoid, rhabdoid features and tumor necrosis.  Margins negative. -Most recent PET scan in November 2020 did not show any evidence of metastatic disease. -Needs surveillance visits every 3 to 6 months for 3 years followed by annually up to 5 years.  CT CAP every 3 to 6 months for the first 3 years and then annually up to 5 years.    3.  Stage II (T2 N0 M0) left breast cancer: -Status post left mastectomy, in 1990 followed by chemotherapy. -Right breast mammogram on 02/05/2019 was BI-RADS Category 1.  4.  Lack of appetite: -She has experienced improvement in appetite since she was started on prednisone.  She was able to eat 2 meals yesterday. -She is also taking Megace 80 mg daily and Restoril 15 mg at bedtime. -I plan to titrate up Restoril as needed.   Orders placed this encounter:  Orders Placed This Encounter  Procedures  . CBC with Differential/Platelet  . Comprehensive metabolic panel  . Iron and TIBC  . Ferritin  . Vitamin B12  . Folate  . Ambulatory referral to Physical Therapy   Total time spent is  40 minutes with more than 50% of the time spent face-to-face discussing new diagnosis, prognosis, treatment plan, counseling and coordination of care.   Derek Jack, MD [place] [phone]

## 2019-10-27 ENCOUNTER — Ambulatory Visit (HOSPITAL_COMMUNITY): Payer: Medicare PPO | Attending: Hematology | Admitting: Physical Therapy

## 2019-10-27 ENCOUNTER — Other Ambulatory Visit: Payer: Self-pay | Admitting: Thoracic Surgery (Cardiothoracic Vascular Surgery)

## 2019-10-27 DIAGNOSIS — R262 Difficulty in walking, not elsewhere classified: Secondary | ICD-10-CM | POA: Insufficient documentation

## 2019-10-27 DIAGNOSIS — Z9181 History of falling: Secondary | ICD-10-CM | POA: Diagnosis present

## 2019-10-27 DIAGNOSIS — M6281 Muscle weakness (generalized): Secondary | ICD-10-CM | POA: Insufficient documentation
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
## 2019-10-27 NOTE — Patient Instructions (Signed)
.   walking program with cane  - goal is 5x a day starting at 3- 24minutes at time  . sit to stands with chair pushed up against wall or shut door - goal is every hour stand up and sit down 5 times - try not to use hands

## 2019-10-30 ENCOUNTER — Encounter (HOSPITAL_COMMUNITY): Payer: Self-pay | Admitting: Physical Therapy

## 2019-10-30 ENCOUNTER — Encounter (HOSPITAL_COMMUNITY): Payer: Self-pay

## 2019-10-30 ENCOUNTER — Other Ambulatory Visit: Payer: Self-pay

## 2019-10-30 ENCOUNTER — Ambulatory Visit (HOSPITAL_COMMUNITY): Payer: Medicare PPO | Admitting: Physical Therapy

## 2019-10-30 NOTE — Therapy (Signed)
[place], [place], [address] Phone: [phone]   Fax:  [phone]  Erroneous encounter  Patient Details  Name: [name] MRN: [id number] Date of Birth: September 22, 1940 Referring Provider (PT): Derek Jack   Encounter Date: 10/30/2019   There were no vitals filed for this visit.  Subjective Assessment - 10/30/19 1452    Subjective  Statess he is really tired, she has not gotten a nap in today, States that she got her cane but she is forgetting. No pain  but 9/10 fatigue. Daughter reports she fell on the way to the kitchen yesterday but no real pain and basically sat down on the floor.    Patient is accompained by:  Family member    Pertinent History  left lobectomy 09/11/19, left masectomy, recent weight loss and decreased appetitie    Limitations  Standing;Walking;House hold activities    Patient Stated Goals  to be able to walk safely in her home and take care of her home without assistance       Patient arrived in severe fatigue, barely could stay awake while sitting in a chair at start of session. Session ended early and no billable units attached to this note.   3:05 PM, 10/30/19 Jerene Pitch, DPT Physical Therapy with [facility]  [phone] office  [place] [address] [place], [place], [address] Phone: [phone]   Fax:  [phone]  Name: [name] MRN: [id number] Date of Birth: 05/15/1941

## 2019-11-02 ENCOUNTER — Encounter (HOSPITAL_COMMUNITY): Payer: Self-pay | Admitting: Physical Therapy

## 2019-11-02 ENCOUNTER — Other Ambulatory Visit: Payer: Self-pay

## 2019-11-02 ENCOUNTER — Ambulatory Visit (HOSPITAL_COMMUNITY): Payer: Medicare PPO | Attending: Hematology | Admitting: Physical Therapy

## 2019-11-02 DIAGNOSIS — Z9181 History of falling: Secondary | ICD-10-CM

## 2019-11-02 DIAGNOSIS — R262 Difficulty in walking, not elsewhere classified: Secondary | ICD-10-CM | POA: Diagnosis present

## 2019-11-02 DIAGNOSIS — M6281 Muscle weakness (generalized): Secondary | ICD-10-CM | POA: Diagnosis present

## 2019-11-02 NOTE — Therapy (Signed)
[place], [place], [address] Phone: [phone]   Fax:  [phone]  Physical Therapy Treatment  Patient Details  Name: [name] MRN: [id number] Date of Birth: 1940/12/07 Referring Provider (PT): Derek Jack   Encounter Date: 11/02/2019  PT End of Session - 11/02/19 0828    Visit Number  2    Number of Visits  12    Date for PT Re-Evaluation  12/08/19    Authorization Type  [employer] - auth required    Authorization Time Period  POC dates 10/27/19 -12/08/2019    Authorization - Visit Number  2    Authorization - Number of Visits  10    PT Start Time  0829    PT Stop Time  0909    PT Time Calculation (min)  40 min       Past Medical History:  Diagnosis Date  . Adenocarcinoma of left breast 04/16/2012   Stage II (T2 N0 M0) left-sided adenocarcinoma breast diagnosed in 1990 by Dr. Romona Curls and treated by Dr. Juanita Craver on NSAB PPD-19 with CMF followed by 5 years of tamoxifen and she remains disease free thus far.   . Breast cancer ([place])    left breast/mastectomy/dx approx1990  . Gall bladder stones   . Grave's disease   . High triglycerides   . History of kidney stones    small stone  rt   . Nerve compression    pinched in neck  . Pneumonia 1990  . PONV (postoperative nausea and vomiting)     Past Surgical History:  Procedure Laterality Date  . CARPAL TUNNEL RELEASE     rt. hand  . cataract surgery     bilateral  . COLONOSCOPY    . EYE SURGERY    . LAPAROSCOPIC NEPHRECTOMY Left 03/25/2019   Procedure: LAPAROSCOPIC RADICAL NEPHRECTOMY;  Surgeon: Ceasar Mons, MD;  Location: [facility];  Service: Urology;  Laterality: Left;  Marland Kitchen MASTECTOMY     left.  Marland Kitchen NODE DISSECTION Left 09/11/2019   Procedure: Node Dissection;  Surgeon: [name], MD;  Location: [facility] [facility];  Service: Thoracic;  Laterality: Left;  . THORACOTOMY/LOBECTOMY Left 09/11/2019   Procedure:  Thoracotomy/Lobectomy Left Upper Lobe;  Surgeon: [name], MD;  Location: [place] [facility];  Service: Thoracic;  Laterality: Left;  . TONSILLECTOMY      There were no vitals filed for this visit.  Subjective Assessment - 11/02/19 0830    Subjective  States she is feeling good much better in the morning an no pain. Forget to exercises. No pain today.    Patient is accompained by:  Family member    Pertinent History  left lobectomy 09/11/19, left masectomy, recent weight loss and decreased appetitie    Limitations  Standing;Walking;House hold activities    Patient Stated Goals  to be able to walk safely in her home and take care of her home without assistance         [facility] PT Assessment - 11/02/19 0001      Assessment   Medical Diagnosis  weakness    Referring Provider (PT)  Derek Jack                   [facility] Adult PT Treatment/Exercise - 11/02/19 0001      Ambulation/Gait   Ambulation/Gait  Yes    Ambulation/Gait Assistance  5: Supervision    Ambulation Distance (Feet)  226 Feet  Assistive device  Straight cane    Ambulation Surface  Level;Indoor    Gait velocity  decreased    Stairs  Yes    Stair Management Technique  Alternating pattern;One rail Left;With cane    Height of Stairs  4   inch   Gait Comments  2MW - 226 feet 4 sets of walking and 3 sets of stairs - O2 to 92% after ambulation      Exercises   Exercises  Lumbar      Lumbar Exercises: Seated   Sit to Stand  5 reps   5 sets, UE on thighs              PT Short Term Goals - 10/27/19 1234      PT SHORT TERM GOAL #1   Title  Patient will be independent in home exercise program to improve functional outcomes.    Time  3    Period  Weeks    Status  New    Target Date  11/17/19      PT SHORT TERM GOAL #2   Title  Patient will report at least 25% improvement in overall function.    Time  3    Period  Weeks    Status  New    Target Date  11/17/19        PT Long Term  Goals - 10/27/19 1235      PT LONG TERM GOAL #1   Title  Patient will be able to ambulate at least 226 feet with least restrictive assistive device in 2 minutes to demonstrate improved functional endurance.    Time  6    Period  Weeks    Status  New    Target Date  12/08/19      PT LONG TERM GOAL #2   Title  Patient will be able to perform 5x STS in < 18 seconds to demonstrate improved functional strength.    Time  6    Period  Weeks    Status  New    Target Date  12/08/19            Plan - 11/02/19 1443    Clinical Impression Statement  Reviewed HEP as patient has not been doing exercises at home. Allowed a minute rest in between sit to stands sets to allow patient to catch breath.  Added stairs and patient needed cues to keep feet straight as well as to rest in between, difficult to catch her breath after the first breath. Allowed her to sit in the room with her mask off away from others to catch her breath. Improved 2 minute walk noted on this date with patient walking a full lap (226 feet ) in 2 minutes. Reminded patient about importance of HEP compliance to expect improvements in overall function. Patient would continue to benefit from skilled physical therapy.    Personal Factors and Comorbidities  Age;Comorbidity 1;Comorbidity 2;Finances    Comorbidities  s/p left lobectomy 09/11/19, left masectomy, recent weight loss (16#)    Examination-Activity Limitations  Bathing;Squat;Bend;Stairs;Stand;Transfers;Dressing;Hygiene/Grooming;Locomotion Level;Carry    Examination-Participation Restrictions  Cleaning;Shop;Community Activity;Laundry;Meal Prep    Stability/Clinical Decision Making  Stable/Uncomplicated    Rehab Potential  Good    PT Frequency  2x / week    PT Duration  6 weeks    PT Treatment/Interventions  ADLs/Self Care Home Management;Biofeedback;Balance training;Therapeutic exercise;Therapeutic activities;Functional mobility training;Stair training;Gait training;Neuromuscular  re-education;Patient/family education    PT Next Visit Plan  focus on  endurance activities, some dynamic balance, LE functional strengthe (steps, side stepping, etc)    PT Home Exercise Plan  2/23 walking program and STS    Consulted and Agree with Plan of Care  Patient;Family member/caregiver    Family Member Consulted  daughter - [name]       Patient will benefit from skilled therapeutic intervention in order to improve the following deficits and impairments:  Decreased endurance, Decreased knowledge of precautions, Decreased activity tolerance, Decreased knowledge of use of DME, Decreased strength, Decreased balance, Decreased mobility, Difficulty walking, Decreased safety awareness  Visit Diagnosis: Difficulty in walking, not elsewhere classified  History of falling  Muscle weakness (generalized)     Problem List Patient Active Problem List   Diagnosis Date Noted  . Dehydration 09/29/2019  . Acute dehydration 09/29/2019  . Lung cancer ([place]) 09/11/2019  . Left upper lobe pulmonary nodule 09/11/2019  . Nodule of left lung 07/29/2019  . Clear cell renal cell carcinoma, left ([place]) 04/13/2019  . Renal mass 03/25/2019  . Left kidney mass 02/16/2019  . Adenocarcinoma of left breast 04/16/2012    9:15 AM, 11/02/19 Jerene Pitch, DPT Physical Therapy with [facility]  [phone] office  [place] [address] [place], [place], [address] Phone: [phone]   Fax:  [phone]  Name: [name] MRN: [id number] Date of Birth: 12/17/1940

## 2019-11-03 ENCOUNTER — Encounter: Payer: Self-pay | Admitting: Thoracic Surgery (Cardiothoracic Vascular Surgery)

## 2019-11-03 ENCOUNTER — Ambulatory Visit (INDEPENDENT_AMBULATORY_CARE_PROVIDER_SITE_OTHER): Payer: Self-pay | Admitting: Thoracic Surgery (Cardiothoracic Vascular Surgery)

## 2019-11-03 VITALS — BP 115/69 | HR 96 | Temp 97.5°F | Resp 20 | Ht 65.0 in | Wt 113.0 lb

## 2019-11-03 DIAGNOSIS — Z9889 Other specified postprocedural states: Secondary | ICD-10-CM

## 2019-11-03 DIAGNOSIS — C3492 Malignant neoplasm of unspecified part of left bronchus or lung: Secondary | ICD-10-CM

## 2019-11-03 MED ORDER — MEGESTROL ACETATE 40 MG PO TABS: 80.0000 mg | ORAL_TABLET | Freq: Every day | ORAL | 1 refills | Status: DC

## 2019-11-03 NOTE — Progress Notes (Signed)
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  She had a left upper lobectomy on 09/11/2019.  There was a lymph node invading the main pulmonary artery which necessitated conversion from robotic to thoracotomy approach.  She had air leak postoperatively and went home with a pigtail catheter in place.  She got readmitted for dehydration due to dry heaves and nausea.  She had generalized failure to thrive.  I last saw her on 10/20/2019.  We gave her a prednisone taper and also started Megace and Remeron.  She has seen some improvement.  Her appetite has improved but is not back to normal.  She does still have some incisional pain but is only occasionally taking Tylenol and is not taking any narcotics.  She saw Dr. Delton Coombes and he started physical therapy.  She has had 2 sessions of that so far.  Per her daughter her session yesterday went well.  Past Medical History:  Diagnosis Date  . Adenocarcinoma of left breast 04/16/2012   Stage II (T2 N0 M0) left-sided adenocarcinoma breast diagnosed in 1990 by Dr. Romona Curls and treated by Dr. Juanita Craver on NSAB PPD-19 with CMF followed by 5 years of tamoxifen and she remains disease free thus far.   . Breast cancer ([place])    left breast/mastectomy/dx approx1990  . Gall bladder stones   . Grave's disease   . High triglycerides   . History of kidney stones    small stone  rt   . Nerve compression    pinched in neck  . Pneumonia 1990  . PONV (postoperative nausea and vomiting)     Current Outpatient Medications  Medication Sig Dispense Refill  . acetaminophen (TYLENOL) 500 MG tablet Take 500 mg by mouth every 6 (six) hours as needed for  mild pain, moderate pain or headache.     Marland Kitchen aspirin 325 MG tablet Take 1 tablet (325 mg total) by mouth every 6 (six) hours as needed for moderate pain. Resume in 1 week (Patient taking differently: Take 325 mg by mouth daily. )    . hydroxypropyl methylcellulose / hypromellose (ISOPTO TEARS / GONIOVISC) 2.5 % ophthalmic solution Place 1 drop into both eyes 3 (three) times daily as needed for dry eyes.    Marland Kitchen levothyroxine (SYNTHROID, LEVOTHROID) 100 MCG tablet Take 100 mcg by mouth daily before breakfast.     . meclizine (ANTIVERT) 50 MG tablet Take 25 mg by mouth 3 (three) times daily as needed for dizziness.     . megestrol (MEGACE) 40 MG tablet Take 2 tablets (80 mg total) by mouth daily. 60 tablet 1  . mirtazapine (REMERON) 15 MG tablet Take 1 tablet (15 mg total) by mouth at bedtime. 30 tablet 0  . Multiple Vitamins-Minerals (CENTRUM SILVER ULTRA WOMENS) TABS Take 1 tablet by mouth daily.     . simvastatin (ZOCOR) 20 MG tablet Take 20 mg by mouth at bedtime.       No current facility-administered medications for this visit.    Physical Exam BP 115/69 (BP Location: Right Arm, Patient Position: Sitting, Cuff Size: Normal)   Pulse 96  Temp (!) 97.5 F (36.4 C) (Temporal)   Resp 20   Ht 5\' 5"  (1.651 m)   Wt 113 lb (51.3 kg)   SpO2 99% Comment: RA  BMI 18.15 kg/m  79 year old woman in no acute distress Flat affect Moves slowly Alert and oriented x3 with no focal neurologic deficit Lungs diminished at left base, otherwise clear Cardiac regular rate and rhythm   Impression: [name] is a 79 year old woman who had a left thoracotomy for a stage IIb (T1, N1) non-small cell carcinoma almost 2 months ago.  She had a prolonged hospital course with an an air leak and ultimately went home with a tube.  She had a poor appetite and a lot of nausea and required readmission for dehydration.  Overall she has had a very difficult time.  At her last visit we started Megace and Remeron and also  gave her a prednisone taper.  She did seem to get a boost from the prednisone taper and her appetite and p.o. intake has improved.  She has been able to work with physical therapy.  She says she is able to sleep at night.  Her daughter says that she sleeps most of the day as well.  She still has incisional pain may always have some, but that has improved and she is no longer taking any narcotics.  She saw Dr. Delton Coombes.  He is holding off on adjuvant chemotherapy for now due to her failure to thrive.  She does appear to have made significant improvement since her last visit with me a couple of weeks ago.  Hopefully she will continue to improve over the next several weeks as well.  Plan: Continue Megace and Remeron Follow-up as scheduled with Dr. Delton Coombes Return in 1 month with PA and lateral chest x-ray to check on progress  [name], MD [facility] [phone]

## 2019-11-05 ENCOUNTER — Telehealth (HOSPITAL_COMMUNITY): Payer: Self-pay | Admitting: Physical Therapy

## 2019-11-05 ENCOUNTER — Other Ambulatory Visit: Payer: Self-pay

## 2019-11-05 ENCOUNTER — Ambulatory Visit (HOSPITAL_COMMUNITY): Payer: Medicare PPO | Admitting: Physical Therapy

## 2019-11-05 NOTE — Telephone Encounter (Signed)
pt's dtr called in to cancel today's appt due to her mom does not feel well

## 2019-11-09 ENCOUNTER — Other Ambulatory Visit: Payer: Self-pay

## 2019-11-09 ENCOUNTER — Encounter (HOSPITAL_COMMUNITY): Payer: Self-pay | Admitting: Physical Therapy

## 2019-11-09 ENCOUNTER — Ambulatory Visit (HOSPITAL_COMMUNITY): Payer: Medicare PPO | Admitting: Physical Therapy

## 2019-11-09 DIAGNOSIS — R262 Difficulty in walking, not elsewhere classified: Secondary | ICD-10-CM

## 2019-11-09 DIAGNOSIS — Z9181 History of falling: Secondary | ICD-10-CM

## 2019-11-09 DIAGNOSIS — M6281 Muscle weakness (generalized): Secondary | ICD-10-CM

## 2019-11-09 NOTE — Therapy (Addendum)
PHYSICAL THERAPY DISCHARGE SUMMARY  Visits from Start of Care:3  Current functional level related to goals / functional outcomes: Could not be reassessed secondary to unplanned discharge.   Remaining deficits: Could not be reassessed secondary to unplanned discharge.     Education / Equipment: Could not be reassessed secondary to unplanned discharge.    Plan: Patient agrees to discharge.  Patient goals were not met. Patient is being discharged due to the physician's request.  ?????  MD wanted patient to receive home health physical therapy.       [place], [place], [address] Phone: [phone]   Fax:  [phone]  Physical Therapy Treatment  Patient Details  Name: [name] MRN: [id number] Date of Birth: 06-01-1941 Referring Provider (PT): Derek Jack   Encounter Date: 11/09/2019  PT End of Session - 11/09/19 0832    Visit Number  3    Number of Visits  12    Date for PT Re-Evaluation  12/08/19    Authorization Type  [employer] - auth required    Authorization Time Period  POC dates 10/27/19 -12/08/2019    Authorization - Visit Number  2    Authorization - Number of Visits  10    PT Start Time  5597    PT Stop Time  0910    PT Time Calculation (min)  38 min       Past Medical History:  Diagnosis Date  . Adenocarcinoma of left breast 04/16/2012   Stage II (T2 N0 M0) left-sided adenocarcinoma breast diagnosed in 1990 by Dr. Romona Curls and treated by Dr. Juanita Craver on NSAB PPD-19 with CMF followed by 5 years of tamoxifen and she remains disease free thus far.   . Breast cancer ([place])    left breast/mastectomy/dx approx1990  . Gall bladder stones   . Grave's disease   . High triglycerides   . History of kidney stones    small stone  rt   . Nerve compression    pinched in neck  . Pneumonia 1990  . PONV (postoperative nausea and vomiting)     Past Surgical History:  Procedure  Laterality Date  . CARPAL TUNNEL RELEASE     rt. hand  . cataract surgery     bilateral  . COLONOSCOPY    . EYE SURGERY    . LAPAROSCOPIC NEPHRECTOMY Left 03/25/2019   Procedure: LAPAROSCOPIC RADICAL NEPHRECTOMY;  Surgeon: Ceasar Mons, MD;  Location: [facility];  Service: Urology;  Laterality: Left;  Marland Kitchen MASTECTOMY     left.  Marland Kitchen NODE DISSECTION Left 09/11/2019   Procedure: Node Dissection;  Surgeon: [name], MD;  Location: [facility];  Service: Thoracic;  Laterality: Left;  . THORACOTOMY/LOBECTOMY Left 09/11/2019   Procedure: Thoracotomy/Lobectomy Left Upper Lobe;  Surgeon: [name], MD;  Location: [facility];  Service: Thoracic;  Laterality: Left;  . TONSILLECTOMY      There were no vitals filed for this visit.      [facility] PT Assessment - 11/09/19 0001      Assessment   Medical Diagnosis  weakness    Referring Provider (PT)  Derek Jack                   [facility] Adult PT Treatment/Exercise - 11/09/19 0001      Ambulation/Gait   Ambulation/Gait  Yes    Ambulation/Gait Assistance  5: Supervision    Assistive device  Straight cane  Ambulation Surface  Level;Indoor    Gait velocity  decreased    Stairs  Yes    Stairs Assistance  5: Supervision    Stair Management Technique  Alternating pattern;One rail Left;With cane    Number of Stairs  7   performed 2x w/just L railing going up (setup at home)    Height of Stairs  4    Gait Comments  3 laps fof 226 feet- standing rest breaks wiht breathing as needed. - total standing and walkignwiht no seated rest breaks 12:30       Lumbar Exercises: Standing   Other Standing Lumbar Exercises  breathing with arms on wall 3x5 breathes, focus on exhale with pursed lips              PT Education - 11/09/19 0843    Education Details  on incorporating functional tasks during the day to increase physical activity. Importance of HEP compliance and increasing activity to return to prior level of  function. Discussed prior level of function and current reasons for barriers to increasing HEP.    Person(s) Educated  Patient;Child(ren)    Methods  Explanation    Comprehension  Verbalized understanding       PT Short Term Goals - 10/27/19 1234      PT SHORT TERM GOAL #1   Title  Patient will be independent in home exercise program to improve functional outcomes.    Time  3    Period  Weeks    Status  New    Target Date  11/17/19      PT SHORT TERM GOAL #2   Title  Patient will report at least 25% improvement in overall function.    Time  3    Period  Weeks    Status  New    Target Date  11/17/19        PT Long Term Goals - 10/27/19 1235      PT LONG TERM GOAL #1   Title  Patient will be able to ambulate at least 226 feet with least restrictive assistive device in 2 minutes to demonstrate improved functional endurance.    Time  6    Period  Weeks    Status  New    Target Date  12/08/19      PT LONG TERM GOAL #2   Title  Patient will be able to perform 5x STS in < 18 seconds to demonstrate improved functional strength.    Time  6    Period  Weeks    Status  New    Target Date  12/08/19            Plan - 11/09/19 0924    Clinical Impression Statement  Tolerated session moderately well. Daughter confided that patient has not been doing much at home. Patient when alone stated that her family does everything for her and discourage her from doing much. #Educated both patient and daughter about incorporating more daily activities into routine to improve overall functional tolerance and decrease dependence on others. Performed 12 minutes and 30 seconds of activity today  (walking, standing rest breaks and standing exercises) to improve overall functional tolerance. Cued patient to perform long exhale and pursed lip breathing to improve O2 saturation as needed and O2 saturation was monitored throughout session. Will continue to focus on improving functional tolerance and  strength while monitoring vitals. Paten would continue to benefit from skilled physical therapy to improve functional independence.  Personal Factors and Comorbidities  Age;Comorbidity 1;Comorbidity 2;Finances    Comorbidities  s/p left lobectomy 09/11/19, left masectomy, recent weight loss (16#)    Examination-Activity Limitations  Bathing;Squat;Bend;Stairs;Stand;Transfers;Dressing;Hygiene/Grooming;Locomotion Level;Carry    Examination-Participation Restrictions  Cleaning;Shop;Community Activity;Laundry;Meal Prep    Stability/Clinical Decision Making  Stable/Uncomplicated    Rehab Potential  Good    PT Frequency  2x / week    PT Duration  6 weeks    PT Treatment/Interventions  ADLs/Self Care Home Management;Biofeedback;Balance training;Therapeutic exercise;Therapeutic activities;Functional mobility training;Stair training;Gait training;Neuromuscular re-education;Patient/family education    PT Next Visit Plan  focus on endurance activities, some dynamic balance, LE functional strengthe (steps, side stepping, etc)    PT Home Exercise Plan  2/23 walking program and STS    Consulted and Agree with Plan of Care  Patient;Family member/caregiver    Family Member Consulted  daughter - [name]       Patient will benefit from skilled therapeutic intervention in order to improve the following deficits and impairments:  Decreased endurance, Decreased knowledge of precautions, Decreased activity tolerance, Decreased knowledge of use of DME, Decreased strength, Decreased balance, Decreased mobility, Difficulty walking, Decreased safety awareness  Visit Diagnosis: Difficulty in walking, not elsewhere classified  History of falling  Muscle weakness (generalized)     Problem List Patient Active Problem List   Diagnosis Date Noted  . Dehydration 09/29/2019  . Acute dehydration 09/29/2019  . Lung cancer ([place]) 09/11/2019  . Left upper lobe pulmonary nodule 09/11/2019  . Nodule of left lung 07/29/2019   . Clear cell renal cell carcinoma, left ([place]) 04/13/2019  . Renal mass 03/25/2019  . Left kidney mass 02/16/2019  . Adenocarcinoma of left breast 04/16/2012    9:27 AM, 11/09/19 Jerene Pitch, DPT Physical Therapy with [facility]  [phone] office  [place] [address] [place], [place], [address] Phone: [phone]   Fax:  [phone]  Name: [name] MRN: [id number] Date of Birth: [birth year]

## 2019-11-10 ENCOUNTER — Encounter: Payer: Self-pay | Admitting: Internal Medicine

## 2019-11-10 ENCOUNTER — Inpatient Hospital Stay (HOSPITAL_COMMUNITY): Payer: Medicare PPO

## 2019-11-10 ENCOUNTER — Inpatient Hospital Stay (HOSPITAL_COMMUNITY): Payer: Medicare PPO | Attending: Hematology | Admitting: Hematology

## 2019-11-10 ENCOUNTER — Other Ambulatory Visit: Payer: Medicare PPO | Admitting: Internal Medicine

## 2019-11-10 VITALS — BP 101/69 | HR 113 | Temp 97.5°F | Resp 18 | Wt 111.2 lb

## 2019-11-10 DIAGNOSIS — Z905 Acquired absence of kidney: Secondary | ICD-10-CM | POA: Diagnosis not present

## 2019-11-10 DIAGNOSIS — Z853 Personal history of malignant neoplasm of breast: Secondary | ICD-10-CM | POA: Insufficient documentation

## 2019-11-10 DIAGNOSIS — R911 Solitary pulmonary nodule: Secondary | ICD-10-CM

## 2019-11-10 DIAGNOSIS — R410 Disorientation, unspecified: Secondary | ICD-10-CM | POA: Diagnosis not present

## 2019-11-10 DIAGNOSIS — Z515 Encounter for palliative care: Secondary | ICD-10-CM

## 2019-11-10 DIAGNOSIS — Z85528 Personal history of other malignant neoplasm of kidney: Secondary | ICD-10-CM | POA: Diagnosis not present

## 2019-11-10 DIAGNOSIS — C3412 Malignant neoplasm of upper lobe, left bronchus or lung: Secondary | ICD-10-CM | POA: Diagnosis not present

## 2019-11-10 DIAGNOSIS — D649 Anemia, unspecified: Secondary | ICD-10-CM | POA: Diagnosis not present

## 2019-11-10 DIAGNOSIS — R630 Anorexia: Secondary | ICD-10-CM | POA: Diagnosis not present

## 2019-11-10 DIAGNOSIS — Z9012 Acquired absence of left breast and nipple: Secondary | ICD-10-CM | POA: Insufficient documentation

## 2019-11-10 DIAGNOSIS — Z7982 Long term (current) use of aspirin: Secondary | ICD-10-CM | POA: Insufficient documentation

## 2019-11-10 DIAGNOSIS — Z800 Family history of malignant neoplasm of digestive organs: Secondary | ICD-10-CM | POA: Diagnosis not present

## 2019-11-10 DIAGNOSIS — Z7952 Long term (current) use of systemic steroids: Secondary | ICD-10-CM | POA: Diagnosis not present

## 2019-11-10 DIAGNOSIS — Z801 Family history of malignant neoplasm of trachea, bronchus and lung: Secondary | ICD-10-CM | POA: Insufficient documentation

## 2019-11-10 DIAGNOSIS — Z79899 Other long term (current) drug therapy: Secondary | ICD-10-CM | POA: Insufficient documentation

## 2019-11-10 DIAGNOSIS — Z87891 Personal history of nicotine dependence: Secondary | ICD-10-CM | POA: Diagnosis not present

## 2019-11-10 DIAGNOSIS — Z7189 Other specified counseling: Secondary | ICD-10-CM

## 2019-11-10 LAB — IRON AND TIBC
Iron: 43 ug/dL (ref 28–170)
Saturation Ratios: 10 % — ABNORMAL LOW (ref 10.4–31.8)
TIBC: 413 ug/dL (ref 250–450)
UIBC: 370 ug/dL

## 2019-11-10 LAB — CBC WITH DIFFERENTIAL/PLATELET
Abs Immature Granulocytes: 0.01 10*3/uL (ref 0.00–0.07)
Basophils Absolute: 0.1 10*3/uL (ref 0.0–0.1)
Basophils Relative: 1 %
Eosinophils Absolute: 0.1 10*3/uL (ref 0.0–0.5)
Eosinophils Relative: 2 %
HCT: 28.7 % — ABNORMAL LOW (ref 36.0–46.0)
Hemoglobin: 8.3 g/dL — ABNORMAL LOW (ref 12.0–15.0)
Immature Granulocytes: 0 %
Lymphocytes Relative: 22 %
Lymphs Abs: 1.4 10*3/uL (ref 0.7–4.0)
MCH: 25.9 pg — ABNORMAL LOW (ref 26.0–34.0)
MCHC: 28.9 g/dL — ABNORMAL LOW (ref 30.0–36.0)
MCV: 89.4 fL (ref 80.0–100.0)
Monocytes Absolute: 0.4 10*3/uL (ref 0.1–1.0)
Monocytes Relative: 6 %
Neutro Abs: 4.4 10*3/uL (ref 1.7–7.7)
Neutrophils Relative %: 69 %
Platelets: 373 10*3/uL (ref 150–400)
RBC: 3.21 MIL/uL — ABNORMAL LOW (ref 3.87–5.11)
RDW: 15.6 % — ABNORMAL HIGH (ref 11.5–15.5)
WBC: 6.3 10*3/uL (ref 4.0–10.5)
nRBC: 0 % (ref 0.0–0.2)

## 2019-11-10 LAB — COMPREHENSIVE METABOLIC PANEL
ALT: 14 U/L (ref 0–44)
AST: 18 U/L (ref 15–41)
Albumin: 3.9 g/dL (ref 3.5–5.0)
Alkaline Phosphatase: 75 U/L (ref 38–126)
Anion gap: 11 (ref 5–15)
BUN: 18 mg/dL (ref 8–23)
CO2: 22 mmol/L (ref 22–32)
Calcium: 9.2 mg/dL (ref 8.9–10.3)
Chloride: 108 mmol/L (ref 98–111)
Creatinine, Ser: 0.93 mg/dL (ref 0.44–1.00)
GFR calc Af Amer: >60 mL/min (ref >60)
GFR calc non Af Amer: 59 mL/min — ABNORMAL LOW (ref >60)
Glucose, Bld: 105 mg/dL — ABNORMAL HIGH (ref 70–99)
Potassium: 3.9 mmol/L (ref 3.5–5.1)
Sodium: 141 mmol/L (ref 135–145)
Total Bilirubin: 0.4 mg/dL (ref 0.3–1.2)
Total Protein: 7 g/dL (ref 6.5–8.1)

## 2019-11-10 LAB — VITAMIN B12: Vitamin B-12: 469 pg/mL (ref 180–914)

## 2019-11-10 LAB — FERRITIN: Ferritin: 10 ng/mL — ABNORMAL LOW (ref 11–307)

## 2019-11-10 LAB — FOLATE: Folate: 61.2 ng/mL (ref >5.9)

## 2019-11-10 MED ORDER — DEXAMETHASONE 2 MG PO TABS: 2.0000 mg | ORAL_TABLET | Freq: Every day | ORAL | 0 refills | Status: DC

## 2019-11-10 NOTE — Progress Notes (Signed)
I met with patient during the visit with Dr. Delton Coombes today. She was here with her daughter [name].  [name] reports that she has been going to outpatient rehab and they gave her exercises to use at home. They are not helpful to her as they are too much. She is requesting that we help get her in with [facility].  They were her home health agency at one point in the past and they were happy with the services.   I have called Richrd Prime the referral coordinator with [facility]. I had to leave a VM with him.

## 2019-11-10 NOTE — Patient Instructions (Addendum)
[place] at [facility] Discharge Instructions  You were seen today by Dr. Delton Coombes. He went over your recent lab results. He will see you back in 2 weeks for labs, repeat scans, and follow up.  Hold Remeron to see if that will help with cognitive issues. Start Decadron 2 mg every morning for appetite and energy.     Thank you for choosing [place] at [facility] to provide your oncology and hematology care.  To afford each patient quality time with our provider, please arrive at least 15 minutes before your scheduled appointment time.   If you have a lab appointment with the [facility] please come in thru the  Main Entrance and check in at the main information desk  You need to re-schedule your appointment should you arrive 10 or more minutes late.  We strive to give you quality time with our providers, and arriving late affects you and other patients whose appointments are after yours.  Also, if you no show three or more times for appointments you may be dismissed from the clinic at the providers discretion.     Again, thank you for choosing [facility].  Our hope is that these requests will decrease the amount of time that you wait before being seen by our physicians.       _____________________________________________________________  Should you have questions after your visit to [facility], please contact our office at [phone] between the hours of 8:00 a.m. and 4:30 p.m.  Voicemails left after 4:00 p.m. will not be returned until the following business day.  For prescription refill requests, have your pharmacy contact our office and allow 72 hours.    Cancer Center Support Programs:   > Cancer Support Group  2nd Tuesday of the month 1pm-2pm, Journey Room

## 2019-11-10 NOTE — Assessment & Plan Note (Addendum)
1.  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 -She was recommended 4 cycles of adjuvant chemotherapy based on NCCN guidelines with carboplatin and paclitaxel.  However she is too weak to consider it. -Daughter reports that she is very confused lately. -We will discontinue mirtazapine which can cause confusion.  We will also check MRI with and without contrast. -She is mostly sitting or lying in bed and sleeps a lot during daytime. -Daughter is very concerned about her wellbeing if she goes back to work on Thursday.  We will make referral to home physical therapy. -For her severe weakness and tiredness, I will start her on dexamethasone 2 mg daily in the mornings for 10 days and see if it helps.  Daughter reports that she felt very well with improvement in appetite when she was on taper of prednisone. -I will see her back after the MRI.  2.  Stage III (PT3PNX) clear-cell renal cell carcinoma: -Adrenal sparing laparoscopic left nephrectomy on 03/25/2019 by Dr. Lovena Neighbours. -Most recent PET scan on November 2020 did not show any evidence of metastatic disease.  3.  Lack of appetite: -She had experienced improvement in appetite when she was taking prednisone taper. -She is currently taking Megace 80 mg twice daily.  I did not want to increase the dose as risk of DVT. -We will discontinue Restoril because of confusion.  We will start her on dexamethasone 2 mg once a day.  4.  Stage II left breast cancer: -Status post left mastectomy 1990 followed by chemotherapy. -Right breast mammogram on 02/05/2019 was BI-RADS Category 1.  5.  Normocytic anemia: -CBC today shows hemoglobin 8.3 with MCV of 89.4.  Creatinine is 0.9.  Calcium is 9.2.  Albumin 3.9. -I have sent ferritin, iron panel, Y22 and folic acid.  We will follow up on it.

## 2019-11-10 NOTE — Progress Notes (Signed)
March 9th, 2021 [facility] Palliative Care Consult Note Telephone: [phone]  Fax: [phone]  Due to the current COVID-19 infection/crises, the family prefer, and have given their verbal consent for, a provider visit via telemedicine. HIPPA policies of confidentially were discussed. Video-audio (telehealth) contact was unable to be done due technical barriers from the patient's side.  PATIENT NAME: [name] DOB:September 02, 1941 YTK:[id number] [address] [place] [address]. Phone: [phone]  PRIMARY CARE PROVIDER: Alanson Tyler, [facility], Dr. Jani Tyler [address] [place], [place] [address] [place] ([place]) f/u feb 16 [name] ([place]) [name] Feb 22nd. Plan of therapy.    REFERRING PROVIDER:Dr. Jani Tyler   RESPONSIBLE PARTY:[name] (daughter) Jerilynn Mages) [phone] *Call this number for Consult* [email or web address] [name] (daughter) [phone] (M)    ASSESSMENT / RECOMMENDATIONS: 1. Functional / Cognitive Assessment / Family Coping/Supports:  [name] reports patient with increased fatigue. When patient gets ready for her doctor appointments or off-site PT appointments that it takes all of patient's strength, such that she is totally wiped out and sleeps for 3 days straight. Patient can ambulate about home with use of a cane. She is continent of bowel and bladder. She has had 2 falls over the last few weeks but didn't have her cane with her at the time. [name] worries that increased confusion and fatigue may be r/t underlying mini stroke, medication side effect, or metastasis.   Now back from an office visit with patient's Oncologist [name]. [name] feels feels more hopeful. [name] had ordered initiation of a low dose steroid to help with patient's energy and appetite (patient has done well with past trials of oral steroids), as well as discontinuation of an antidepressant that may  have beeen contributing to patient's fatigue/confusion. [name] has also order PT to visit at the home, so patient wont be so exhausted trying to make these appointments. There is also a head CT to be scheduled in April, to evaluated other sources of patient confusion.  [name] is due to go back to work in 2 days. She will be working a night shift 7d/wk. She's excited to be getting back, though worried about her mom being home by herself. [name] plans to check in on patient twice a day (on way to and back from work). [name] is providing easily microwavable meals. She has provided a medical alert necklace, and is investigating installing a home camera monitoring system. Shelby's sister can stay with the patient one day/week. [name] had applied a lock to the basement door.  2. Advance Care Planning: A. Directives: Discussed; Patient not sure of decision for advance cardiac life support in the event of a cardiopulmonary arrest. We touched on aspects of the MOST form very briefly. I e-mailed her and her daughters companion patient education material regarding the MOST, and will address this on future visits.  B. Goals of Care:  to Gain in strength; f/u therapeutic options for Rx lung cancer.  3. Follow up [facility] tele-health visit scheduled for Friday April 26 at 9am.   I spent 60 minutes providing this consultation from 1pm to 2pm. More than 50% of the time in this consultation was spent coordinating communication.   HISTORY OF PRESENT ILLNESS:[name] a 79 y.o. femalewith history of tobacco abuse, renal cell carcinoma (adrenal sparing nephrectomy July 2020), hypothyroidism (post treatment of Graves disease), hypertriglyceridemia, stage II adenocarcinoma L breast (L mastectomy 1990), and a stage IIb squamous cell carcinoma of the  left upper lobe (LU lobectomy 09/11/2019. Pigtail catheter). 1/8-1/19/2021: L U lobectomy; post of air leak. Discharged with pigtail cath to mini PleurEvac 1/26-1/28/2021  hospitalized for dehydration. L pigtail catheter removed 09/30/19. 10/13/2019 ER visit treated with IVFs for dehydration. Had episode bloody stool; recom OP GI consult. This is a f/u palliative care visit from 10/19/2019.  CODE STATUS: full code  PPS:50%  HOSPICE ELIGIBILITY/DIAGNOSIS:TBD  PAST MEDICAL HISTORY:  Past Medical History:  Diagnosis Date  . Adenocarcinoma of left breast 04/16/2012   Stage II (T2 N0 M0) left-sided adenocarcinoma breast diagnosed in 1990 by Dr. Romona Curls and treated by Dr. Juanita Craver on NSAB PPD-19 with CMF followed by 5 years of tamoxifen and she remains disease free thus far.   . Breast cancer ([place])    left breast/mastectomy/dx approx1990  . Gall bladder stones   . Grave's disease   . High triglycerides   . History of kidney stones    small stone  rt   . Nerve compression    pinched in neck  . Pneumonia 1990  . PONV (postoperative nausea and vomiting)     SOCIAL HX:  Social History   Tobacco Use  . Smoking status: Former Smoker    Packs/day: 1.00    Years: 60.00    Pack years: 60.00    Types: Cigarettes    Quit date: 09/22/2019    Years since quitting: 0.1  . Smokeless tobacco: Never Used  . Tobacco comment: attempted to stop but its expensive to quit  Substance Use Topics  . Alcohol use: No    ALLERGIES:  Allergies  Allergen Reactions  . Chantix [Varenicline]     depression     PERTINENT MEDICATIONS:  Outpatient Encounter Medications as of 11/10/2019  Medication Sig  . acetaminophen (TYLENOL) 500 MG tablet Take 500 mg by mouth every 6 (six) hours as needed for mild pain, moderate pain or headache.   Marland Kitchen aspirin 325 MG tablet Take 1 tablet (325 mg total) by mouth every 6 (six) hours as needed for moderate pain. Resume in 1 week (Patient taking differently: Take 325 mg by mouth daily. )  . hydroxypropyl methylcellulose / hypromellose (ISOPTO TEARS / GONIOVISC) 2.5 % ophthalmic solution Place 1 drop into both eyes 3 (three) times daily as  needed for dry eyes.  Marland Kitchen levothyroxine (SYNTHROID, LEVOTHROID) 100 MCG tablet Take 100 mcg by mouth daily before breakfast.   . meclizine (ANTIVERT) 50 MG tablet Take 25 mg by mouth 3 (three) times daily as needed for dizziness.   . megestrol (MEGACE) 40 MG tablet Take 2 tablets (80 mg total) by mouth daily.  . mirtazapine (REMERON) 15 MG tablet Take 1 tablet (15 mg total) by mouth at bedtime.  . Multiple Vitamins-Minerals (CENTRUM SILVER ULTRA WOMENS) TABS Take 1 tablet by mouth daily.   . simvastatin (ZOCOR) 20 MG tablet Take 20 mg by mouth at bedtime.     No facility-administered encounter medications on file as of 11/10/2019.    PHYSICAL EXAM:   PE deferred d/t telehealth telephonic nature of visit, with patient's daughter to discuss Goals of [place], NP

## 2019-11-10 NOTE — Progress Notes (Signed)
[place], [place] [address]   CLINIC:  Medical Oncology/Hematology  PCP:  Alanson Puls, [facility] [place] [address] [phone]   REASON FOR VISIT:  Newly diagnosed left lung squamous cell carcinoma.     CANCER STAGING: Cancer Staging Adenocarcinoma of left breast Staging form: Breast, AJCC 7th Edition - Clinical: Stage IIA (T2, N0, cM0) - Signed by Baird Cancer, PA on 04/16/2012  Clear cell renal cell carcinoma, left (HCC) Staging form: Kidney, AJCC 8th Edition - Clinical: Stage III (cT3a, cNX, cM0) - Signed by Derek Jack, MD on 04/13/2019  Lung cancer [facility]) Staging form: Lung, AJCC 8th Edition - Clinical stage from 10/26/2019: Stage IIB (cT1b, cN1, cM0) - Signed by Derek Jack, MD on 10/26/2019    INTERVAL HISTORY:  Ms. Pirro 79 y.o. female seen for follow-up of her lung cancer.  Weight has been stable since last visit.  However daughter reports that she is not moving much.  She has gone to physical therapy 1 time but gets fully tired after it.  Denies any falls.  Reportedly confused a lot.  Appetite is 50%.  Energy levels are low.  Reports pain at the surgical site.  Shortness of breath on exertion present at times.  Reports headaches and dizziness.  REVIEW OF SYSTEMS:  Review of Systems  Constitutional: Positive for fatigue.  Respiratory: Positive for shortness of breath.   Gastrointestinal: Positive for nausea.  Neurological: Positive for dizziness and headaches.  All other systems reviewed and are negative.    PAST MEDICAL/SURGICAL HISTORY:  Past Medical History:  Diagnosis Date  . Adenocarcinoma of left breast 04/16/2012   Stage II (T2 N0 M0) left-sided adenocarcinoma breast diagnosed in 1990 by Dr. Romona Curls and treated by Dr. Juanita Craver on NSAB PPD-19 with CMF followed by 5 years of tamoxifen and she remains disease free thus far.   . Breast cancer ([place])    left  breast/mastectomy/dx approx1990  . Gall bladder stones   . Grave's disease   . High triglycerides   . History of kidney stones    small stone  rt   . Nerve compression    pinched in neck  . Pneumonia 1990  . PONV (postoperative nausea and vomiting)    Past Surgical History:  Procedure Laterality Date  . CARPAL TUNNEL RELEASE     rt. hand  . cataract surgery     bilateral  . COLONOSCOPY    . EYE SURGERY    . LAPAROSCOPIC NEPHRECTOMY Left 03/25/2019   Procedure: LAPAROSCOPIC RADICAL NEPHRECTOMY;  Surgeon: Ceasar Mons, MD;  Location: [facility];  Service: Urology;  Laterality: Left;  Marland Kitchen MASTECTOMY     left.  Marland Kitchen NODE DISSECTION Left 09/11/2019   Procedure: Node Dissection;  Surgeon: [name], MD;  Location: [facility];  Service: Thoracic;  Laterality: Left;  . THORACOTOMY/LOBECTOMY Left 09/11/2019   Procedure: Thoracotomy/Lobectomy Left Upper Lobe;  Surgeon: [name], MD;  Location: [facility];  Service: Thoracic;  Laterality: Left;  . TONSILLECTOMY       SOCIAL HISTORY:  Social History   Socioeconomic History  . Marital status: Divorced    Spouse name: Not on file  . Number of children: Not on file  . Years of education: Not on file  . Highest education level: Not on file  Occupational History  . Not on file  Tobacco Use  . Smoking status: Former Smoker    Packs/day:  1.00    Years: 60.00    Pack years: 60.00    Types: Cigarettes    Quit date: 09/22/2019    Years since quitting: 0.1  . Smokeless tobacco: Never Used  . Tobacco comment: attempted to stop but its expensive to quit  Substance and Sexual Activity  . Alcohol use: No  . Drug use: No  . Sexual activity: Not on file  Other Topics Concern  . Not on file  Social History Narrative  . Not on file   Social Determinants of Health   Financial Resource Strain:   . Difficulty of Paying Living Expenses: Not on file  Food Insecurity:   . Worried About Charity fundraiser in the Last Year:  Not on file  . Ran Out of Food in the Last Year: Not on file  Transportation Needs:   . Lack of Transportation (Medical): Not on file  . Lack of Transportation (Non-Medical): Not on file  Physical Activity:   . Days of Exercise per Week: Not on file  . Minutes of Exercise per Session: Not on file  Stress:   . Feeling of Stress : Not on file  Social Connections:   . Frequency of Communication with Friends and Family: Not on file  . Frequency of Social Gatherings with Friends and Family: Not on file  . Attends Religious Services: Not on file  . Active Member of Clubs or Organizations: Not on file  . Attends Archivist Meetings: Not on file  . Marital Status: Not on file  Intimate Partner Violence:   . Fear of Current or Ex-Partner: Not on file  . Emotionally Abused: Not on file  . Physically Abused: Not on file  . Sexually Abused: Not on file    FAMILY HISTORY:  Family History  Problem Relation Age of Onset  . Cancer Father        lung cancer  . Cancer Brother        colon cancer  . Cancer Other        lung cancer/died age 4    CURRENT MEDICATIONS:  Outpatient Encounter Medications as of 11/10/2019  Medication Sig  . aspirin 325 MG tablet Take 1 tablet (325 mg total) by mouth every 6 (six) hours as needed for moderate pain. Resume in 1 week (Patient taking differently: Take 325 mg by mouth daily. )  . levothyroxine (SYNTHROID, LEVOTHROID) 100 MCG tablet Take 100 mcg by mouth daily before breakfast.   . megestrol (MEGACE) 40 MG tablet Take 2 tablets (80 mg total) by mouth daily.  . mirtazapine (REMERON) 15 MG tablet Take 1 tablet (15 mg total) by mouth at bedtime.  . Multiple Vitamins-Minerals (CENTRUM SILVER ULTRA WOMENS) TABS Take 1 tablet by mouth daily.   . simvastatin (ZOCOR) 20 MG tablet Take 20 mg by mouth at bedtime.    Marland Kitchen acetaminophen (TYLENOL) 500 MG tablet Take 500 mg by mouth every 6 (six) hours as needed for mild pain, moderate pain or headache.   .  dexamethasone (DECADRON) 2 MG tablet Take 1 tablet (2 mg total) by mouth daily.  . hydroxypropyl methylcellulose / hypromellose (ISOPTO TEARS / GONIOVISC) 2.5 % ophthalmic solution Place 1 drop into both eyes 3 (three) times daily as needed for dry eyes.  . meclizine (ANTIVERT) 50 MG tablet Take 25 mg by mouth 3 (three) times daily as needed for dizziness.    No facility-administered encounter medications on file as of 11/10/2019.    ALLERGIES:  Allergies  Allergen Reactions  . Chantix [Varenicline]     depression     PHYSICAL EXAM:  ECOG Performance status: 1  Vitals:   11/10/19 1028  BP: 101/69  Pulse: (!) 113  Resp: 18  Temp: (!) 97.5 F (36.4 C)  SpO2: 100%   Filed Weights   11/10/19 1028  Weight: 111 lb 3.2 oz (50.4 kg)    Physical Exam Vitals reviewed.  Constitutional:      Appearance: Normal appearance.  Cardiovascular:     Rate and Rhythm: Normal rate and regular rhythm.     Heart sounds: Normal heart sounds.  Pulmonary:     Effort: Pulmonary effort is normal.     Breath sounds: Normal breath sounds.  Abdominal:     General: There is no distension.     Palpations: Abdomen is soft. There is no mass.  Musculoskeletal:        General: No swelling.  Neurological:     General: No focal deficit present.     Mental Status: She is alert and oriented to person, place, and time.  Psychiatric:        Mood and Affect: Mood normal.        Behavior: Behavior normal.      LABORATORY DATA:  I have reviewed the labs as listed.  CBC    Component Value Date/Time   WBC 6.3 11/10/2019 0942   RBC 3.21 (L) 11/10/2019 0942   HGB 8.3 (L) 11/10/2019 0942   HCT 28.7 (L) 11/10/2019 0942   PLT 373 11/10/2019 0942   MCV 89.4 11/10/2019 0942   MCH 25.9 (L) 11/10/2019 0942   MCHC 28.9 (L) 11/10/2019 0942   RDW 15.6 (H) 11/10/2019 0942   LYMPHSABS 1.4 11/10/2019 0942   MONOABS 0.4 11/10/2019 0942   EOSABS 0.1 11/10/2019 0942   BASOSABS 0.1 11/10/2019 0942   CMP Latest  Ref Rng & Units 11/10/2019 10/13/2019 09/30/2019  Glucose 70 - 99 mg/dL 105(H) 114(H) 117(H)  BUN 8 - 23 mg/dL 18 17 22   Creatinine 0.44 - 1.00 mg/dL 0.93 1.07(H) 0.80  Sodium 135 - 145 mmol/L 141 136 135  Potassium 3.5 - 5.1 mmol/L 3.9 3.9 4.4  Chloride 98 - 111 mmol/L 108 100 105  CO2 22 - 32 mmol/L 22 23 22   Calcium 8.9 - 10.3 mg/dL 9.2 9.2 8.4(L)  Total Protein 6.5 - 8.1 g/dL 7.0 - -  Total Bilirubin 0.3 - 1.2 mg/dL 0.4 - -  Alkaline Phos 38 - 126 U/L 75 - -  AST 15 - 41 U/L 18 - -  ALT 0 - 44 U/L 14 - -       DIAGNOSTIC IMAGING:  I have independently reviewed the scans and discussed with the patient.   I have reviewed Venita Lick LPN's note and agree with the documentation.  I personally performed a face-to-face visit, made revisions and my assessment and plan is as follows.    ASSESSMENT & PLAN:   Lung cancer ([place]) 1.  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 -She was recommended 4 cycles of adjuvant chemotherapy based on NCCN guidelines with carboplatin and paclitaxel.  However she is too weak to consider it. -Daughter reports that she is very confused lately. -We will discontinue mirtazapine which can cause confusion.  We will also check MRI with and without contrast. -She is mostly sitting or lying in bed and sleeps a lot during daytime. -Daughter  is very concerned about her wellbeing if she goes back to work on Thursday.  We will make referral to home physical therapy. -For her severe weakness and tiredness, I will start her on dexamethasone 2 mg daily in the mornings for 10 days and see if it helps.  Daughter reports that she felt very well with improvement in appetite when she was on taper of prednisone. -I will see her back after the MRI.  2.  Stage III (PT3PNX) clear-cell renal cell carcinoma: -Adrenal sparing laparoscopic left nephrectomy on 03/25/2019 by Dr. Lovena Neighbours. -Most recent PET scan on  November 2020 did not show any evidence of metastatic disease.  3.  Lack of appetite: -She had experienced improvement in appetite when she was taking prednisone taper. -She is currently taking Megace 80 mg twice daily.  I did not want to increase the dose as risk of DVT. -We will discontinue Restoril because of confusion.  We will start her on dexamethasone 2 mg once a day.  4.  Stage II left breast cancer: -Status post left mastectomy 1990 followed by chemotherapy. -Right breast mammogram on 02/05/2019 was BI-RADS Category 1.  5.  Normocytic anemia: -CBC today shows hemoglobin 8.3 with MCV of 89.4.  Creatinine is 0.9.  Calcium is 9.2.  Albumin 3.9. -I have sent ferritin, iron panel, X45 and folic acid.  We will follow up on it.   Orders placed this encounter:  Orders Placed This Encounter  Procedures  . [facility] [place], [place] [phone]

## 2019-11-11 ENCOUNTER — Telehealth (HOSPITAL_COMMUNITY): Payer: Self-pay | Admitting: Physical Therapy

## 2019-11-11 ENCOUNTER — Other Ambulatory Visit: Payer: Self-pay | Admitting: Thoracic Surgery (Cardiothoracic Vascular Surgery)

## 2019-11-11 NOTE — Telephone Encounter (Signed)
S/w patient's family and she should be getting a MD order for Home Health. They will continue until that is approved.

## 2019-11-12 ENCOUNTER — Ambulatory Visit (HOSPITAL_COMMUNITY): Payer: Medicare PPO | Admitting: Physical Therapy

## 2019-11-16 ENCOUNTER — Other Ambulatory Visit (HOSPITAL_COMMUNITY): Payer: Self-pay | Admitting: *Deleted

## 2019-11-16 ENCOUNTER — Ambulatory Visit (HOSPITAL_COMMUNITY): Payer: Medicare PPO | Admitting: Physical Therapy

## 2019-11-16 ENCOUNTER — Telehealth (HOSPITAL_COMMUNITY): Payer: Self-pay | Admitting: *Deleted

## 2019-11-16 MED ORDER — MISC. DEVICES MISC: 99 refills | Status: DC

## 2019-11-16 NOTE — Telephone Encounter (Signed)
Verbral order given from Dr. Delton Coombes for pt to start PT with [facility].

## 2019-11-16 NOTE — Progress Notes (Signed)
I received a phone call from Verdis Frederickson, physical therapist with [facility].  She was asking for verbal orders for physical therapy consisting of strengthening training, gait training and transfer assistance.  1 week 1, 2 weeks 6, 1 week 2.  I have given them verbal orders for that per Dr. Delton Coombes.  I am also faxing a prescription to them for commode per their request.

## 2019-11-19 ENCOUNTER — Encounter (HOSPITAL_COMMUNITY): Payer: Medicare PPO | Admitting: Physical Therapy

## 2019-11-23 ENCOUNTER — Encounter (HOSPITAL_COMMUNITY): Payer: Medicare PPO | Admitting: Physical Therapy

## 2019-11-24 ENCOUNTER — Other Ambulatory Visit: Payer: Self-pay | Admitting: Thoracic Surgery (Cardiothoracic Vascular Surgery)

## 2019-11-25 ENCOUNTER — Other Ambulatory Visit: Payer: Self-pay | Admitting: Thoracic Surgery (Cardiothoracic Vascular Surgery)

## 2019-11-26 ENCOUNTER — Encounter (HOSPITAL_COMMUNITY): Payer: Medicare PPO | Admitting: Physical Therapy

## 2019-11-27 ENCOUNTER — Other Ambulatory Visit: Payer: Medicare PPO | Admitting: Internal Medicine

## 2019-11-27 ENCOUNTER — Other Ambulatory Visit: Payer: Self-pay

## 2019-11-27 ENCOUNTER — Encounter: Payer: Self-pay | Admitting: Internal Medicine

## 2019-11-27 ENCOUNTER — Telehealth (HOSPITAL_COMMUNITY): Payer: Self-pay | Admitting: Physical Therapy

## 2019-11-27 DIAGNOSIS — Z7189 Other specified counseling: Secondary | ICD-10-CM

## 2019-11-27 DIAGNOSIS — Z515 Encounter for palliative care: Secondary | ICD-10-CM

## 2019-11-27 NOTE — Progress Notes (Signed)
March 25th, 2021 [facility] Palliative Care Consult Note Telephone: [phone]  Fax: [phone]   Due to the current COVID-19 infection/crises, the family prefer, and have given their verbal consent for, a provider visit via telemedicine. HIPPA policies of confidentially were discussed. Video-audio (telehealth) contact was unable to be done due technical barriers from the patient's side.   PATIENT NAME: [name] DOB: 09-Aug-1941 MRN: [id number] [address] [place] [address]. Phone: [phone]   PRIMARY CARE PROVIDER:    Alanson Puls, [facility], Dr. Jani Gravel [address] [place],   [address] [place] ([place]) f/u feb 16 Sreed[place] Katragadda ([place]) [place] Feb 22nd . Plan of therapy.    REFERRING PROVIDER:  Dr. Jani Gravel    RESPONSIBLE PARTY: [name] (daughter) Jerilynn Mages) [phone]  *Call this number for Consult* [email or web address] .Earline Mayotte (daughter) [phone] (M)     ASSESSMENT / RECOMMENDATIONS:  1. Advance Care Planning:  A. Directives: Discussed; I reviewed this again today. [name] plans to meet with an attorney to solidify [place], Chauncey Fischer, living will, will. [name] wishes to defer questions regarding DNR/MOST till after these other tasks are in place. I mentioned that she can reach out to me at any time to complete the DNR/MOST should they choose. [name] has a bit of info regarding these directives supplied by [facility].  B. Goals of Care:  to Gain in strength; f/u therapeutic options for Rx lung cancer.  2.Functional / Cognitive Assessment:  Patient is slowly improving in strength. Home PT (Tom from [facility]) has been following, and she shows more motivation in doing the exercises prescribed by them. Patient surprised everyone by recently walking to her mailbox. She is independent in ADLs, and able to heat up the microwaveable meals left for her by daughter. No recent falls. She ambulates with a cane through  the home, or by steadying herself with furniture. She is continent of bowel and bladder.  Patient completed a short course of Steroids to help with appetite, and that seemed to jump start her to eat a little better. She consumes 2 small meals a day. Daughter reports patient is drinking better. On patient's home scales, her weight is 110lbs. At 5'5" her BMI is 26kg/m2. About 10 months ago her weight was 130lbs.  She had some L sided chest wall pain well managed with Tylenol 1000mg  bid, and a heating pad tid.  Continues with some intermittent confusion.   Within the next few weeks, patient has a f/u brain MRI and thereafter f/u appointments with her surgeon (post op) and oncologist.     3. Family supports/coping: Dtr [name] has enjoyed going back to work; night shift 7d/wk. She checks in on patient twice a day (on way to and back from work). [name] is providing easily microwavable meals. She has provided a medical alert necklace and is investigating installing a home camera monitoring system. Karen's sister stays with the patient one day/week; daughter also chipping in.     4. Follow up [facility] tele-health visit scheduled for Fri April 30 at [place] I spent 60 minutes providing this consultation from 1pm to 2pm. More than 50% of the time in this consultation was spent coordinating communication.    HISTORY OF PRESENT ILLNESS:  [name] is a 79 y.o. female with history of tobacco abuse, renal cell carcinoma (adrenal sparing nephrectomy July 2020), hypothyroidism (post treatment of Graves disease), hypertriglyceridemia, stage II adenocarcinoma L breast (L mastectomy 1990), and a stage  IIb squamous cell carcinoma of the left upper lobe (LU lobectomy 09/11/2019. Pigtail catheter).  1/8-1/19/2021: L U lobectomy; post of air leak. Discharged with pigtail cath to mini PleurEvac 1/26-1/28/2021 hospitalized for dehydration. L pigtail catheter removed 09/30/19. 10/13/2019 ER visit treated with IVFs for dehydration. Had  episode bloody stool; recom OP GI consult. This is a f/u palliative care visit from 11/10/2019.   CODE STATUS: full code   PPS: 50%   HOSPICE ELIGIBILITY/DIAGNOSIS: TBD  PAST MEDICAL HISTORY:  Past Medical History:  Diagnosis Date  . Adenocarcinoma of left breast 04/16/2012   Stage II (T2 N0 M0) left-sided adenocarcinoma breast diagnosed in 1990 by Dr. Romona Curls and treated by Dr. Juanita Craver on NSAB PPD-19 with CMF followed by 5 years of tamoxifen and she remains disease free thus far.   . Breast cancer ([place])    left breast/mastectomy/dx approx1990  . Gall bladder stones   . Grave's disease   . High triglycerides   . History of kidney stones    small stone  rt   . Nerve compression    pinched in neck  . Pneumonia 1990  . PONV (postoperative nausea and vomiting)     SOCIAL HX:  Social History   Tobacco Use  . Smoking status: Former Smoker    Packs/day: 1.00    Years: 60.00    Pack years: 60.00    Types: Cigarettes    Quit date: 09/22/2019    Years since quitting: 0.1  . Smokeless tobacco: Never Used  . Tobacco comment: attempted to stop but its expensive to quit  Substance Use Topics  . Alcohol use: No    ALLERGIES:  Allergies  Allergen Reactions  . Chantix [Varenicline]     depression     PERTINENT MEDICATIONS:  Outpatient Encounter Medications as of 11/27/2019  Medication Sig  . acetaminophen (TYLENOL) 500 MG tablet Take 500 mg by mouth every 6 (six) hours as needed for mild pain, moderate pain or headache.   Marland Kitchen aspirin 325 MG tablet Take 1 tablet (325 mg total) by mouth every 6 (six) hours as needed for moderate pain. Resume in 1 week (Patient taking differently: Take 325 mg by mouth daily. )  . dexamethasone (DECADRON) 2 MG tablet Take 1 tablet (2 mg total) by mouth daily.  . hydroxypropyl methylcellulose / hypromellose (ISOPTO TEARS / GONIOVISC) 2.5 % ophthalmic solution Place 1 drop into both eyes 3 (three) times daily as needed for dry eyes.  Marland Kitchen levothyroxine  (SYNTHROID, LEVOTHROID) 100 MCG tablet Take 100 mcg by mouth daily before breakfast.   . meclizine (ANTIVERT) 50 MG tablet Take 25 mg by mouth 3 (three) times daily as needed for dizziness.   . megestrol (MEGACE) 40 MG tablet TAKE 2 TABLETS BY MOUTH EVERY DAY  . mirtazapine (REMERON) 15 MG tablet TAKE 1 TABLET BY MOUTH EVERYDAY AT BEDTIME  . Misc. Devices MISC Please provide patient with 3-in-1 commode  . Multiple Vitamins-Minerals (CENTRUM SILVER ULTRA WOMENS) TABS Take 1 tablet by mouth daily.   . simvastatin (ZOCOR) 20 MG tablet Take 20 mg by mouth at bedtime.     No facility-administered encounter medications on file as of 11/27/2019.    PHYSICAL EXAM:   PE deferred d/t telehealth telephonic nature of visit, with patient's daughter to discuss Goals of [place], NP

## 2019-11-27 NOTE — Telephone Encounter (Signed)
pt cancelled her remaining appts on 3/15 because she will begin PT at home. Notified Jerene Pitch to D/C pt

## 2019-12-01 ENCOUNTER — Other Ambulatory Visit (HOSPITAL_COMMUNITY): Payer: Self-pay | Admitting: *Deleted

## 2019-12-07 ENCOUNTER — Other Ambulatory Visit: Payer: Self-pay | Admitting: Thoracic Surgery (Cardiothoracic Vascular Surgery)

## 2019-12-07 ENCOUNTER — Ambulatory Visit (HOSPITAL_COMMUNITY)
Admission: RE | Admit: 2019-12-07 | Discharge: 2019-12-07 | Disposition: A | Discharge status: Home / Self Care | Payer: Medicare PPO | Source: Ambulatory Visit | Attending: Hematology | Admitting: Hematology

## 2019-12-07 ENCOUNTER — Other Ambulatory Visit: Payer: Self-pay

## 2019-12-07 DIAGNOSIS — C3490 Malignant neoplasm of unspecified part of unspecified bronchus or lung: Secondary | ICD-10-CM

## 2019-12-07 DIAGNOSIS — R911 Solitary pulmonary nodule: Secondary | ICD-10-CM | POA: Diagnosis present

## 2019-12-07 MED ORDER — GADOBUTROL 1 MMOL/ML IV SOLN
5.0000 mL | Freq: Once | INTRAVENOUS | Status: AC | PRN
  Administered 2019-12-07: 5 mL via INTRAVENOUS

## 2019-12-08 ENCOUNTER — Ambulatory Visit (INDEPENDENT_AMBULATORY_CARE_PROVIDER_SITE_OTHER): Payer: Self-pay | Admitting: Thoracic Surgery (Cardiothoracic Vascular Surgery)

## 2019-12-08 ENCOUNTER — Ambulatory Visit
Admission: RE | Admit: 2019-12-08 | Discharge: 2019-12-08 | Disposition: A | Discharge status: Home / Self Care | Payer: Medicare PPO | Source: Ambulatory Visit | Attending: Thoracic Surgery (Cardiothoracic Vascular Surgery) | Admitting: Thoracic Surgery (Cardiothoracic Vascular Surgery)

## 2019-12-08 VITALS — BP 109/62 | HR 98 | Temp 97.9°F | Resp 20 | Ht 65.0 in | Wt 108.0 lb

## 2019-12-08 DIAGNOSIS — Z9889 Other specified postprocedural states: Secondary | ICD-10-CM

## 2019-12-08 DIAGNOSIS — C3492 Malignant neoplasm of unspecified part of left bronchus or lung: Secondary | ICD-10-CM

## 2019-12-08 DIAGNOSIS — Z9689 Presence of other specified functional implants: Secondary | ICD-10-CM

## 2019-12-08 DIAGNOSIS — C3490 Malignant neoplasm of unspecified part of unspecified bronchus or lung: Secondary | ICD-10-CM

## 2019-12-08 DIAGNOSIS — J95812 Postprocedural air leak: Secondary | ICD-10-CM

## 2019-12-08 MED ORDER — PREDNISONE 20 MG PO TABS: 20.0000 mg | ORAL_TABLET | Freq: Every day | ORAL | 0 refills | Status: DC

## 2019-12-08 NOTE — Progress Notes (Signed)
[address]       [place] [address]             [phone]     HPI: [name] returns for scheduled follow-up  [name] is a 79 year old woman with history of tobacco abuse, renal cell carcinoma, nephrectomy in 2020, hypothyroidism, hypertriglyceridemia, and 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  She had a left upper lobectomy on 09/11/2019.  This had to be done through a thoracotomy due to a node invading the main pulmonary artery.  She had an air leak postoperatively and went home with a pigtail catheter in place.  She said a long and difficult postoperative course and continues to have issues.  She had a hospital readmission for nausea and dehydration and general failure to thrive.  In February started on Megace and Remeron and also gave her a prednisone taper.  She had some improvement that particularly with the prednisone taper.  Unfortunately she was having some confusion so her Remeron was stopped.  She recently saw Dr. Delton Coombes.  She was not in any condition to start chemotherapy.  He put her on some dexamethasone, but her daughter said she saw no change with that at all.  Past Medical History:  Diagnosis Date  . Adenocarcinoma of left breast 04/16/2012   Stage II (T2 N0 M0) left-sided adenocarcinoma breast diagnosed in 1990 by Dr. Romona Curls and treated by Dr. Juanita Craver on NSAB PPD-19 with CMF followed by 5 years of tamoxifen and she remains disease free thus far.   . Breast cancer ([place])    left breast/mastectomy/dx approx1990  . Gall bladder stones   . Grave's disease   . High triglycerides   . History of kidney stones    small stone  rt   . Nerve compression    pinched in neck  . Pneumonia 1990  . PONV (postoperative nausea and vomiting)      Current Outpatient Medications  Medication Sig Dispense Refill  . acetaminophen (TYLENOL) 500 MG tablet Take 500 mg by mouth every 6 (six) hours as needed for mild pain, moderate  pain or headache.     Marland Kitchen aspirin 325 MG tablet Take 1 tablet (325 mg total) by mouth every 6 (six) hours as needed for moderate pain. Resume in 1 week (Patient taking differently: Take 325 mg by mouth daily. )    . hydroxypropyl methylcellulose / hypromellose (ISOPTO TEARS / GONIOVISC) 2.5 % ophthalmic solution Place 1 drop into both eyes 3 (three) times daily as needed for dry eyes.    Marland Kitchen levothyroxine (SYNTHROID, LEVOTHROID) 100 MCG tablet Take 100 mcg by mouth daily before breakfast.     . meclizine (ANTIVERT) 50 MG tablet Take 25 mg by mouth 3 (three) times daily as needed for dizziness.     . megestrol (MEGACE) 40 MG tablet TAKE 2 TABLETS BY MOUTH EVERY DAY 60 tablet 1  . Multiple Vitamins-Minerals (CENTRUM SILVER ULTRA WOMENS) TABS Take 1 tablet by mouth daily.     . simvastatin (ZOCOR) 20 MG tablet Take 20 mg by mouth at bedtime.      Marland Kitchen dexamethasone (DECADRON) 2 MG tablet Take 1 tablet (2 mg total) by mouth daily. (Patient not taking: Reported on 12/08/2019) 10 tablet 0  . predniSONE (DELTASONE) 20 MG tablet Take 1 tablet (20 mg total) by mouth daily with breakfast. 14 tablet 0   No current facility-administered medications for this visit.    Physical  Exam BP 109/62   Pulse 98   Temp 97.9 F (36.6 C) (Skin)   Resp 20   Ht 5\' 5"  (1.651 m)   Wt 108 lb (49 kg)   SpO2 96% Comment: RA  BMI 17.75 kg/m  79 year old woman appears chronically ill Flat affect and slow psychomotor response Lungs diminished at left base but otherwise clear Cardiac regular rate and rhythm  Diagnostic Tests: CHEST - 2 VIEW  COMPARISON:  Adjacent radiograph 10/20/2019. most recent CT 07/08/2019  FINDINGS: Postsurgical change in the left hemithorax with volume loss none surgical clips at the hilum. Chain sutures noted at the left lung base. Normal heart size with unchanged mediastinal contours. Aortic atherosclerosis. There is a 9 mm nodular density projecting over the left lung base that was not seen  on prior exam, nonspecific. No confluent consolidation. No pleural effusion or pneumothorax. Post left mastectomy with surgical clips in the axilla.  IMPRESSION: 1. Postsurgical change in the left hemithorax. 2. A 9 mm nodular density projecting over the left lung base, not seen on prior exam. This is nonspecific and may represent superimposition of overlapping structures, however pulmonary nodule is not excluded. Recommend short interval follow-up radiographs versus further evaluation with chest CT.  Aortic Atherosclerosis (ICD10-I70.0).   Electronically Signed   By: Keith Rake M.D.   On: 12/08/2019 16:31 I personally reviewed the chest x-ray images.  I suspect the questionable nodule is really just a confluence of shadows.  Impression: [name] is a 79 year old woman with history of tobacco abuse, renal cell carcinoma, nephrectomy in 2020, hypothyroidism, hypertriglyceridemia, and 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  Her postoperative course has been complicated by an air leak, poor appetite, readmission for dehydration, depression, and generalized failure to thrive.  From a surgical standpoint her incisions healed well and her x-ray looks good, but she just has never recovered from the surgery.  I do think there is a depression component to this but unfortunately Remeron caused some confusion so that was stopped.  Treatment of depression beyond a short dose of acute Remeron is really beyond my expertise.   Per her daughter the only thing that really seems to have made any positive impact was the prednisone taper that she was on.  Obviously prednisone has some serious long-term issues but to try and get her over the hump and achieve some type of positive momentum going to give her 20 mg of prednisone daily for a couple of weeks to see if that makes any difference at all.  She has a follow-up appoint with Dr. Delton Coombes tomorrow.  Plan: Prednisone 20  mg daily for 2 weeks Return in 1 month with PA and lateral chest x-ray  [name], MD [facility] [phone]

## 2019-12-09 ENCOUNTER — Other Ambulatory Visit: Payer: Self-pay

## 2019-12-09 ENCOUNTER — Inpatient Hospital Stay (HOSPITAL_COMMUNITY): Payer: Medicare PPO | Attending: Hematology | Admitting: Hematology

## 2019-12-09 VITALS — BP 109/66 | HR 100 | Temp 97.1°F | Resp 16 | Wt 108.2 lb

## 2019-12-09 DIAGNOSIS — Z800 Family history of malignant neoplasm of digestive organs: Secondary | ICD-10-CM | POA: Diagnosis not present

## 2019-12-09 DIAGNOSIS — Z9012 Acquired absence of left breast and nipple: Secondary | ICD-10-CM | POA: Insufficient documentation

## 2019-12-09 DIAGNOSIS — C3492 Malignant neoplasm of unspecified part of left bronchus or lung: Secondary | ICD-10-CM | POA: Diagnosis not present

## 2019-12-09 DIAGNOSIS — R630 Anorexia: Secondary | ICD-10-CM | POA: Insufficient documentation

## 2019-12-09 DIAGNOSIS — Z853 Personal history of malignant neoplasm of breast: Secondary | ICD-10-CM | POA: Diagnosis not present

## 2019-12-09 DIAGNOSIS — Z87891 Personal history of nicotine dependence: Secondary | ICD-10-CM | POA: Insufficient documentation

## 2019-12-09 DIAGNOSIS — Z7952 Long term (current) use of systemic steroids: Secondary | ICD-10-CM | POA: Insufficient documentation

## 2019-12-09 DIAGNOSIS — Z902 Acquired absence of lung [part of]: Secondary | ICD-10-CM | POA: Insufficient documentation

## 2019-12-09 DIAGNOSIS — Z801 Family history of malignant neoplasm of trachea, bronchus and lung: Secondary | ICD-10-CM | POA: Diagnosis not present

## 2019-12-09 DIAGNOSIS — C3412 Malignant neoplasm of upper lobe, left bronchus or lung: Secondary | ICD-10-CM

## 2019-12-09 DIAGNOSIS — D649 Anemia, unspecified: Secondary | ICD-10-CM | POA: Diagnosis present

## 2019-12-09 NOTE — Patient Instructions (Signed)
[place] at [facility] Discharge Instructions  You were seen today by Dr. Delton Coombes. He went over your recent lab results. We will schedule you for an IV iron infusion.  We will schedule you for a CT scan of your chest.  He will see you back in one month for labs and follow up.   Thank you for choosing [place] at [facility] to provide your oncology and hematology care.  To afford each patient quality time with our provider, please arrive at least 15 minutes before your scheduled appointment time.   If you have a lab appointment with the [facility] please come in thru the  Main Entrance and check in at the main information desk  You need to re-schedule your appointment should you arrive 10 or more minutes late.  We strive to give you quality time with our providers, and arriving late affects you and other patients whose appointments are after yours.  Also, if you no show three or more times for appointments you may be dismissed from the clinic at the providers discretion.     Again, thank you for choosing [facility].  Our hope is that these requests will decrease the amount of time that you wait before being seen by our physicians.       _____________________________________________________________  Should you have questions after your visit to [facility], please contact our office at [phone] between the hours of 8:00 a.m. and 4:30 p.m.  Voicemails left after 4:00 p.m. will not be returned until the following business day.  For prescription refill requests, have your pharmacy contact our office and allow 72 hours.    Cancer Center Support Programs:   > Cancer Support Group  2nd Tuesday of the month 1pm-2pm, Journey Room

## 2019-12-09 NOTE — Progress Notes (Signed)
[place], [place] [address]   CLINIC:  Medical Oncology/Hematology  PCP:  Alanson Puls, [address] [place] [address] [phone]   REASON FOR VISIT:  Newly diagnosed left lung squamous cell carcinoma.     CANCER STAGING: Cancer Staging Adenocarcinoma of left breast Staging form: Breast, AJCC 7th Edition - Clinical: Stage IIA (T2, N0, cM0) - Signed by Baird Cancer, PA on 04/16/2012  Clear cell renal cell carcinoma, left (HCC) Staging form: Kidney, AJCC 8th Edition - Clinical: Stage III (cT3a, cNX, cM0) - Signed by Derek Jack, MD on 04/13/2019  Lung cancer [facility]) Staging form: Lung, AJCC 8th Edition - Clinical stage from 10/26/2019: Stage IIB (cT1b, cN1, cM0) - Signed by Derek Jack, MD on 10/26/2019    INTERVAL HISTORY:  [name] 79 y.o. female seen for follow-up of lung cancer and declining functional status.  She is doing physical therapy twice weekly.  She gets tired easily.  Eating twice daily in small amounts.  Dexamethasone 2 mg did not help with appetite or energy.  Complains of feeling tired.  Could not tolerate iron therapy orally in the past.  Appetite is 50%.  Energy levels are 25%.  Shortness of breath on exertion present.  Occasional nausea and dizziness is stable.  REVIEW OF SYSTEMS:  Review of Systems  Constitutional: Positive for fatigue.  Respiratory: Positive for cough and shortness of breath.   Gastrointestinal: Positive for nausea.  Neurological: Positive for dizziness.  All other systems reviewed and are negative.    PAST MEDICAL/SURGICAL HISTORY:  Past Medical History:  Diagnosis Date  . Adenocarcinoma of left breast 04/16/2012   Stage II (T2 N0 M0) left-sided adenocarcinoma breast diagnosed in 1990 by Dr. Romona Curls and treated by Dr. Juanita Craver on NSAB PPD-19 with CMF followed by 5 years of tamoxifen and she remains disease free thus far.   . Breast cancer ([place])    left breast/mastectomy/dx approx1990  . Gall bladder stones   . Grave's disease   . High triglycerides   . History of kidney stones    small stone  rt   . Nerve compression    pinched in neck  . Pneumonia 1990  . PONV (postoperative nausea and vomiting)    Past Surgical History:  Procedure Laterality Date  . CARPAL TUNNEL RELEASE     rt. hand  . cataract surgery     bilateral  . COLONOSCOPY    . EYE SURGERY    . LAPAROSCOPIC NEPHRECTOMY Left 03/25/2019   Procedure: LAPAROSCOPIC RADICAL NEPHRECTOMY;  Surgeon: Ceasar Mons, MD;  Location: [facility];  Service: Urology;  Laterality: Left;  Marland Kitchen MASTECTOMY     left.  Marland Kitchen NODE DISSECTION Left 09/11/2019   Procedure: Node Dissection;  Surgeon: [name], MD;  Location: [facility];  Service: Thoracic;  Laterality: Left;  . THORACOTOMY/LOBECTOMY Left 09/11/2019   Procedure: Thoracotomy/Lobectomy Left Upper Lobe;  Surgeon: [name], MD;  Location: [facility];  Service: Thoracic;  Laterality: Left;  . TONSILLECTOMY       SOCIAL HISTORY:  Social History   Socioeconomic History  . Marital status: Divorced    Spouse name: Not on file  . Number of children: Not on file  . Years of education: Not on file  . Highest education level: Not on file  Occupational History  . Not on file  Tobacco Use  . Smoking status: Former Smoker    Packs/day: 1.00  Years: 60.00    Pack years: 60.00    Types: Cigarettes    Quit date: 09/22/2019    Years since quitting: 0.2  . Smokeless tobacco: Never Used  . Tobacco comment: attempted to stop but its expensive to quit  Substance and Sexual Activity  . Alcohol use: No  . Drug use: No  . Sexual activity: Not on file  Other Topics Concern  . Not on file  Social History Narrative  . Not on file   Social Determinants of Health   Financial Resource Strain:   . Difficulty of Paying Living Expenses:   Food Insecurity:   . Worried About Charity fundraiser in the Last Year:   .  Arboriculturist in the Last Year:   Transportation Needs:   . Film/video editor (Medical):   Marland Kitchen Lack of Transportation (Non-Medical):   Physical Activity:   . Days of Exercise per Week:   . Minutes of Exercise per Session:   Stress:   . Feeling of Stress :   Social Connections:   . Frequency of Communication with Friends and Family:   . Frequency of Social Gatherings with Friends and Family:   . Attends Religious Services:   . Active Member of Clubs or Organizations:   . Attends Archivist Meetings:   Marland Kitchen Marital Status:   Intimate Partner Violence:   . Fear of Current or Ex-Partner:   . Emotionally Abused:   Marland Kitchen Physically Abused:   . Sexually Abused:     FAMILY HISTORY:  Family History  Problem Relation Age of Onset  . Cancer Father        lung cancer  . Cancer Brother        colon cancer  . Cancer Other        lung cancer/died age 1    CURRENT MEDICATIONS:  Outpatient Encounter Medications as of 12/09/2019  Medication Sig Note  . aspirin 325 MG tablet Take 1 tablet (325 mg total) by mouth every 6 (six) hours as needed for moderate pain. Resume in 1 week (Patient taking differently: Take 325 mg by mouth daily. )   . dexamethasone (DECADRON) 2 MG tablet Take 1 tablet (2 mg total) by mouth daily.   . ferrous sulfate 325 (65 FE) MG tablet Take 325 mg by mouth daily with breakfast.   . levothyroxine (SYNTHROID, LEVOTHROID) 100 MCG tablet Take 100 mcg by mouth daily before breakfast.    . megestrol (MEGACE) 40 MG tablet TAKE 2 TABLETS BY MOUTH EVERY DAY   . Multiple Vitamins-Minerals (CENTRUM SILVER ULTRA WOMENS) TABS Take 1 tablet by mouth daily.    . simvastatin (ZOCOR) 20 MG tablet Take 20 mg by mouth at bedtime.     Marland Kitchen acetaminophen (TYLENOL) 500 MG tablet Take 500 mg by mouth every 6 (six) hours as needed for mild pain, moderate pain or headache.    . hydroxypropyl methylcellulose / hypromellose (ISOPTO TEARS / GONIOVISC) 2.5 % ophthalmic solution Place 1 drop  into both eyes 3 (three) times daily as needed for dry eyes.   . meclizine (ANTIVERT) 50 MG tablet Take 25 mg by mouth 3 (three) times daily as needed for dizziness.    . predniSONE (DELTASONE) 20 MG tablet Take 1 tablet (20 mg total) by mouth daily with breakfast. 12/09/2019: Pt hasn't started yet   No facility-administered encounter medications on file as of 12/09/2019.    ALLERGIES:  Allergies  Allergen Reactions  . Chantix [Varenicline]  depression     PHYSICAL EXAM:  ECOG Performance status: 1  Vitals:   12/09/19 1042  BP: 109/66  Pulse: 100  Resp: 16  Temp: (!) 97.1 F (36.2 C)  SpO2: 100%   Filed Weights   12/09/19 1042  Weight: 108 lb 3.2 oz (49.1 kg)    Physical Exam Vitals reviewed.  Constitutional:      Appearance: Normal appearance.  Cardiovascular:     Rate and Rhythm: Normal rate and regular rhythm.     Heart sounds: Normal heart sounds.  Pulmonary:     Effort: Pulmonary effort is normal.     Breath sounds: Normal breath sounds.  Abdominal:     General: There is no distension.     Palpations: Abdomen is soft. There is no mass.  Musculoskeletal:        General: No swelling.  Neurological:     General: No focal deficit present.     Mental Status: She is alert and oriented to person, place, and time.  Psychiatric:        Mood and Affect: Mood normal.        Behavior: Behavior normal.      LABORATORY DATA:  I have reviewed the labs as listed.  CBC    Component Value Date/Time   WBC 6.3 11/10/2019 0942   RBC 3.21 (L) 11/10/2019 0942   HGB 8.3 (L) 11/10/2019 0942   HCT 28.7 (L) 11/10/2019 0942   PLT 373 11/10/2019 0942   MCV 89.4 11/10/2019 0942   MCH 25.9 (L) 11/10/2019 0942   MCHC 28.9 (L) 11/10/2019 0942   RDW 15.6 (H) 11/10/2019 0942   LYMPHSABS 1.4 11/10/2019 0942   MONOABS 0.4 11/10/2019 0942   EOSABS 0.1 11/10/2019 0942   BASOSABS 0.1 11/10/2019 0942   CMP Latest Ref Rng & Units 11/10/2019 10/13/2019 09/30/2019  Glucose 70 - 99  mg/dL 105(H) 114(H) 117(H)  BUN 8 - 23 mg/dL 18 17 22   Creatinine 0.44 - 1.00 mg/dL 0.93 1.07(H) 0.80  Sodium 135 - 145 mmol/L 141 136 135  Potassium 3.5 - 5.1 mmol/L 3.9 3.9 4.4  Chloride 98 - 111 mmol/L 108 100 105  CO2 22 - 32 mmol/L 22 23 22   Calcium 8.9 - 10.3 mg/dL 9.2 9.2 8.4(L)  Total Protein 6.5 - 8.1 g/dL 7.0 - -  Total Bilirubin 0.3 - 1.2 mg/dL 0.4 - -  Alkaline Phos 38 - 126 U/L 75 - -  AST 15 - 41 U/L 18 - -  ALT 0 - 44 U/L 14 - -       DIAGNOSTIC IMAGING:  I have reviewed her scans.   I have reviewed Venita Lick LPN's note and agree with the documentation.  I personally performed a face-to-face visit, made revisions and my assessment and plan is as follows.    ASSESSMENT & PLAN:   Lung cancer ([place]) 1.  Stage IIb (T1BN1) squamous cell carcinoma left upper lobe of the lung: -Left upper lobectomy on 09/11/2019.  We reviewed pathology in detail. -She is felt not a candidate for adjuvant chemotherapy based on her poor performance status at this time. -I have reviewed MRI of the brain from 12/07/2019.  No metastatic disease.  Chronic ischemic changes seen. -Still feeling tired.  However staying by herself during daytime and daughter checks on her twice a day. -She is doing physical therapy twice weekly.  Gets tired easily.  I will reevaluate her in 4 weeks.  I will schedule her for CT of  the chest prior to next visit.  2.  Decreased appetite: -I have tried dexamethasone 2 mg daily for 10 days.  No improvement. -Dr. Roxan Hockey has given prednisone 20 mg daily for 7 days.  She has done well with prednisone in the past. -She is also taking Megace 40 mg twice daily.  Restoril was discontinued due to confusion. -She is eating twice daily but in very small amounts.  She snacks some during the day.  3.  Normocytic anemia: -We reviewed labs.  Hemoglobin is 8.9.  Ferritin is 10. -I have recommended Feraheme weekly x2.  We discussed about side effects in detail including rare  chance of allergic reactions. -Hopefully this will improve her energy and make her physically more active.   Orders placed this encounter:  Orders Placed This Encounter  Procedures  . CT Chest W Contrast  . Folate  . Vitamin B12  . Iron and TIBC  . Ferritin  . CBC with Differential  . Comprehensive metabolic panel   Total time spent is 30 minutes with more than 60% of the time spent face-to-face discussing scan results, lab results, further treatment plan, counseling and coordination of care.   Derek Jack, MD [place] [phone]

## 2019-12-11 ENCOUNTER — Other Ambulatory Visit (HOSPITAL_COMMUNITY): Payer: Self-pay | Admitting: Nurse Practitioner

## 2019-12-11 ENCOUNTER — Inpatient Hospital Stay (HOSPITAL_COMMUNITY): Payer: Medicare PPO

## 2019-12-11 ENCOUNTER — Encounter (HOSPITAL_COMMUNITY): Payer: Self-pay | Admitting: *Deleted

## 2019-12-11 ENCOUNTER — Encounter (HOSPITAL_COMMUNITY): Payer: Self-pay

## 2019-12-11 ENCOUNTER — Other Ambulatory Visit: Payer: Self-pay

## 2019-12-11 VITALS — BP 121/62 | HR 83 | Temp 98.2°F | Resp 18

## 2019-12-11 DIAGNOSIS — D508 Other iron deficiency anemias: Secondary | ICD-10-CM

## 2019-12-11 DIAGNOSIS — D649 Anemia, unspecified: Secondary | ICD-10-CM | POA: Diagnosis not present

## 2019-12-11 DIAGNOSIS — D509 Iron deficiency anemia, unspecified: Secondary | ICD-10-CM | POA: Insufficient documentation

## 2019-12-11 MED ORDER — ONDANSETRON HCL 4 MG/2ML IJ SOLN
INTRAMUSCULAR | Status: AC
  Filled 2019-12-11: qty 2

## 2019-12-11 MED ORDER — ONDANSETRON HCL 4 MG/2ML IJ SOLN
4.0000 mg | Freq: Once | INTRAMUSCULAR | Status: AC
  Administered 2019-12-11: 4 mg via INTRAVENOUS

## 2019-12-11 MED ORDER — SODIUM CHLORIDE 0.9 % IV SOLN: INTRAVENOUS | Status: DC

## 2019-12-11 MED ORDER — SODIUM CHLORIDE 0.9 % IV SOLN
510.0000 mg | Freq: Once | INTRAVENOUS | Status: AC
  Administered 2019-12-11: 510 mg via INTRAVENOUS
  Filled 2019-12-11: qty 510

## 2019-12-11 MED ORDER — SODIUM CHLORIDE 0.9 % IV SOLN: Freq: Once | INTRAVENOUS | Status: DC
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
## 2019-12-11 NOTE — Patient Instructions (Signed)
[facility] at [facility] Discharge Instructions  Received Feraheme infusion today. Follow-up as scheduled. Call clinic for any questions or concerns   Thank you for choosing [facility] at [facility] to provide your oncology and hematology care.  To afford each patient quality time with our provider, please arrive at least 15 minutes before your scheduled appointment time.   If you have a lab appointment with the Cancer Center please come in thru the Main Entrance and check in at the main information desk.  You need to re-schedule your appointment should you arrive 10 or more minutes late.  We strive to give you quality time with our providers, and arriving late affects you and other patients whose appointments are after yours.  Also, if you no show three or more times for appointments you may be dismissed from the clinic at the providers discretion.     Again, thank you for choosing [facility].  Our hope is that these requests will decrease the amount of time that you wait before being seen by our physicians.       _____________________________________________________________  Should you have questions after your visit to [facility], please contact our office at [phone] between the hours of 8:00 a.m. and 4:30 p.m.  Voicemails left after 4:00 p.m. will not be returned until the following business day.  For prescription refill requests, have your pharmacy contact our office and allow 72 hours.    Due to Covid, you will need to wear a mask upon entering the hospital. If you do not have a mask, a mask will be given to you at the Main Entrance upon arrival. For doctor visits, patients may have 1 support person with them. For treatment visits, patients can not have anyone with them due to social distancing guidelines and our immunocompromised population.     

## 2019-12-11 NOTE — Progress Notes (Signed)
[name] tolerated Feraheme infusion well without complaints or incident.Peripheral IV site checked with positive blood return noted prior to and after infusion VSS upon discharge. Pt discharged via wheelchair in satisfactory condition

## 2019-12-11 NOTE — Assessment & Plan Note (Signed)
1.  Stage IIb (T1BN1) squamous cell carcinoma left upper lobe of the lung: -Left upper lobectomy on 09/11/2019.  We reviewed pathology in detail. -She is felt not a candidate for adjuvant chemotherapy based on her poor performance status at this time. -I have reviewed MRI of the brain from 12/07/2019.  No metastatic disease.  Chronic ischemic changes seen. -Still feeling tired.  However staying by herself during daytime and daughter checks on her twice a day. -She is doing physical therapy twice weekly.  Gets tired easily.  I will reevaluate her in 4 weeks.  I will schedule her for CT of the chest prior to next visit.  2.  Decreased appetite: -I have tried dexamethasone 2 mg daily for 10 days.  No improvement. -Dr. Roxan Hockey has given prednisone 20 mg daily for 7 days.  She has done well with prednisone in the past. -She is also taking Megace 40 mg twice daily.  Restoril was discontinued due to confusion. -She is eating twice daily but in very small amounts.  She snacks some during the day.  3.  Normocytic anemia: -We reviewed labs.  Hemoglobin is 8.9.  Ferritin is 10. -I have recommended Feraheme weekly x2.  We discussed about side effects in detail including rare chance of allergic reactions. -Hopefully this will improve her energy and make her physically more active.

## 2019-12-18 ENCOUNTER — Other Ambulatory Visit: Payer: Self-pay

## 2019-12-18 ENCOUNTER — Inpatient Hospital Stay (HOSPITAL_COMMUNITY): Payer: Medicare PPO

## 2019-12-18 VITALS — BP 110/60 | HR 79 | Temp 97.5°F | Resp 16

## 2019-12-18 DIAGNOSIS — D508 Other iron deficiency anemias: Secondary | ICD-10-CM

## 2019-12-18 DIAGNOSIS — D649 Anemia, unspecified: Secondary | ICD-10-CM | POA: Diagnosis not present

## 2019-12-18 MED ORDER — SODIUM CHLORIDE 0.9 % IV SOLN
510.0000 mg | Freq: Once | INTRAVENOUS | Status: AC
  Administered 2019-12-18: 510 mg via INTRAVENOUS
  Filled 2019-12-18: qty 510

## 2019-12-18 MED ORDER — ONDANSETRON HCL 4 MG/2ML IJ SOLN
INTRAMUSCULAR | Status: AC
  Filled 2019-12-18: qty 2

## 2019-12-18 MED ORDER — ONDANSETRON HCL 4 MG/2ML IJ SOLN
4.0000 mg | Freq: Once | INTRAMUSCULAR | Status: AC
  Administered 2019-12-18: 4 mg via INTRAVENOUS

## 2019-12-18 MED ORDER — SODIUM CHLORIDE 0.9 % IV SOLN: Freq: Once | INTRAVENOUS | Status: AC

## 2019-12-18 NOTE — Progress Notes (Signed)
Iron given per orders. Patient tolerated it well without problems. Vitals stable and discharged home from clinic via wheelchair. Follow up as scheduled.  

## 2019-12-25 ENCOUNTER — Other Ambulatory Visit: Payer: Self-pay | Admitting: Thoracic Surgery (Cardiothoracic Vascular Surgery)

## 2020-01-04 ENCOUNTER — Other Ambulatory Visit: Payer: Self-pay | Admitting: Thoracic Surgery (Cardiothoracic Vascular Surgery)

## 2020-01-04 DIAGNOSIS — C3490 Malignant neoplasm of unspecified part of unspecified bronchus or lung: Secondary | ICD-10-CM

## 2020-01-05 ENCOUNTER — Ambulatory Visit: Payer: Medicare PPO | Admitting: Thoracic Surgery (Cardiothoracic Vascular Surgery)

## 2020-01-05 ENCOUNTER — Other Ambulatory Visit: Payer: Self-pay | Admitting: Thoracic Surgery (Cardiothoracic Vascular Surgery)

## 2020-01-05 ENCOUNTER — Encounter: Payer: Self-pay | Admitting: Thoracic Surgery (Cardiothoracic Vascular Surgery)

## 2020-01-05 ENCOUNTER — Other Ambulatory Visit: Payer: Self-pay

## 2020-01-05 ENCOUNTER — Ambulatory Visit
Admission: RE | Admit: 2020-01-05 | Discharge: 2020-01-05 | Disposition: A | Discharge status: Home / Self Care | Payer: Medicare PPO | Source: Ambulatory Visit | Attending: Thoracic Surgery (Cardiothoracic Vascular Surgery) | Admitting: Thoracic Surgery (Cardiothoracic Vascular Surgery)

## 2020-01-05 VITALS — BP 135/84 | HR 107 | Temp 98.2°F | Resp 28 | Ht 65.0 in | Wt 112.0 lb

## 2020-01-05 DIAGNOSIS — C3492 Malignant neoplasm of unspecified part of left bronchus or lung: Secondary | ICD-10-CM

## 2020-01-05 DIAGNOSIS — J95812 Postprocedural air leak: Secondary | ICD-10-CM

## 2020-01-05 DIAGNOSIS — Z902 Acquired absence of lung [part of]: Secondary | ICD-10-CM

## 2020-01-05 DIAGNOSIS — C3490 Malignant neoplasm of unspecified part of unspecified bronchus or lung: Secondary | ICD-10-CM

## 2020-01-05 MED ORDER — PREDNISONE 10 MG PO TABS: 10.0000 mg | ORAL_TABLET | Freq: Every day | ORAL | 0 refills | Status: DC

## 2020-01-05 NOTE — Progress Notes (Signed)
[address]       [place] [address]             [phone]       HPI: Mrs. Brodrick returns for a scheduled follow-up appointment  [name] is a 79 year old woman with a history of tobacco abuse, stage IIb squamous cell carcinoma of the left upper lobe, renal cell carcinoma, nephrectomy, hypothyroidism, and hypertriglyceridemia.  She had a left thoracotomy for left upper lobectomy on 09/11/2019.  There was a node invading the main pulmonary artery.  Postoperatively she had an air leak and went home with a pigtail catheter.    She has had a long and difficult postoperative course and continues to have issues that have not completely resolved. She was readmitted to the hospital for dehydration and failure to thrive relatively early after the operation.  We tried a Remeron and Megace.  She also responded somewhat to a prednisone taper.  Her Remeron was stopped due to confusion.  She was not able to start chemotherapy due to functional status.  I last saw her in April.  We put her on prednisone 20 mg daily for about 2 weeks just to see if she would respond to that.  According to her daughter she did show some improvement in her appetite and might have gained a pound or 2.  The positive effect was gone within about 3 or 4 days of stopping the prednisone.  She had been on Decadron but did not have any improvement in appetite or energy levels with that.  She says she does feel better and was able to get into the office without having to take a wheelchair for the first time since her surgery.  Past Medical History:  Diagnosis Date  . Adenocarcinoma of left breast 04/16/2012   Stage II (T2 N0 M0) left-sided adenocarcinoma breast diagnosed in 1990 by Dr. Romona Tyler and treated by Dr. Juanita Tyler on NSAB PPD-19 with CMF followed by 5 years of tamoxifen and she remains disease free thus far.   . Breast cancer ([place])    left breast/mastectomy/dx approx1990  . Gall bladder stones   .  Grave's disease   . High triglycerides   . History of kidney stones    small stone  rt   . Nerve compression    pinched in neck  . Pneumonia 1990  . PONV (postoperative nausea and vomiting)     Current Outpatient Medications  Medication Sig Dispense Refill  . acetaminophen (TYLENOL) 500 MG tablet Take 500 mg by mouth every 6 (six) hours as needed for mild pain, moderate pain or headache.     Marland Kitchen aspirin 325 MG tablet Take 1 tablet (325 mg total) by mouth every 6 (six) hours as needed for moderate pain. Resume in 1 week (Patient taking differently: Take 325 mg by mouth daily. )    . ferrous sulfate 325 (65 FE) MG tablet Take 325 mg by mouth daily with breakfast.    . hydroxypropyl methylcellulose / hypromellose (ISOPTO TEARS / GONIOVISC) 2.5 % ophthalmic solution Place 1 drop into both eyes 3 (three) times daily as needed for dry eyes.    Marland Kitchen levothyroxine (SYNTHROID, LEVOTHROID) 100 MCG tablet Take 100 mcg by mouth daily before breakfast.     . meclizine (ANTIVERT) 50 MG tablet Take 25 mg by mouth 3 (three) times daily as needed for dizziness.     . Multiple Vitamins-Minerals (CENTRUM SILVER ULTRA WOMENS) TABS Take 1 tablet  by mouth daily.     . simvastatin (ZOCOR) 20 MG tablet Take 20 mg by mouth at bedtime.      . megestrol (MEGACE) 40 MG tablet TAKE 2 TABLETS BY MOUTH EVERY DAY (Patient not taking: Reported on 01/05/2020) 60 tablet 1  . predniSONE (DELTASONE) 10 MG tablet Take 1 tablet (10 mg total) by mouth daily with breakfast. 45 tablet 0   No current facility-administered medications for this visit.    Physical Exam BP 135/84 (BP Location: Right Arm, Patient Position: Sitting, Cuff Size: Normal)   Pulse (!) 107   Temp 98.2 F (36.8 C) (Temporal)   Resp (!) 28   Ht 5\' 5"  (1.651 m)   Wt 112 lb (50.8 kg)   SpO2 95% Comment: RA  BMI 18.55 kg/m  79 year old woman in no acute distress Alert and oriented x3, flat affect Lungs diminished at left base but otherwise clear Cardiac  tachycardic and regular  Diagnostic Tests: CHEST - 2 VIEW  COMPARISON:  12/08/2019  FINDINGS: Normal heart size, mediastinal contours, and pulmonary vascularity.  Atherosclerotic calcification aorta.  Volume loss in the LEFT hemithorax secondary to prior lung resection.  Emphysematous changes without infiltrate, pleural effusion or pneumothorax.  Post LEFT mastectomy and axillary node dissection.  Bones demineralized.  IMPRESSION: Postsurgical changes of the LEFT hemithorax with underlying COPD.  No acute abnormalities.   Electronically Signed   By: Lavonia Dana M.D.   On: 01/05/2020 16:05  I personally reviewed the chest x-ray images and concur with the findings noted above  Impression: [name] is a 79 year old woman  with a history of tobacco abuse, stage IIb squamous cell carcinoma of the left upper lobe, renal cell carcinoma, nephrectomy, hypothyroidism, and hypertriglyceridemia.  She had a thoracotomy for left upper lobectomy in January.  She required a thoracotomy due to a lymph node invading the main pulmonary artery.  She has had a difficult time recovering from the operation.    She was having a tremendous amount of pain early on but that has improved significantly.  She is only taking Tylenol now.    She also had poor appetite, poor p.o. intake, weight loss, and general failure to thrive.  That issue still persists.  Daughter did note some improvement both times she was on prednisone previously.  We discussed the risks of long-term prednisone and some of the adverse effects that are associated with that.  They understand those issues.  I think if we can continue prednisone for a longer period of time and get her into a positive anabolic state it would be beneficial.  After our discussion we are going to try a 6 weeks course of prednisone at 10 mg daily.  She has an appointment with Dr. Delton Coombes on Monday and will have a CT scan done as  well.  Plan: Prednisone 10 mg p.o. daily for 6 weeks Follow-up as scheduled with Dr. Delton Coombes on Monday. Return in 6 weeks to check on progress  Shelby Nakayama, MD [facility] [phone]

## 2020-01-08 ENCOUNTER — Other Ambulatory Visit: Payer: Self-pay

## 2020-01-08 ENCOUNTER — Inpatient Hospital Stay (HOSPITAL_COMMUNITY): Payer: Medicare PPO | Attending: Hematology

## 2020-01-08 ENCOUNTER — Encounter (HOSPITAL_COMMUNITY): Payer: Self-pay

## 2020-01-08 ENCOUNTER — Ambulatory Visit (HOSPITAL_COMMUNITY)
Admission: RE | Admit: 2020-01-08 | Discharge: 2020-01-08 | Disposition: A | Discharge status: Home / Self Care | Payer: Medicare PPO | Source: Ambulatory Visit | Attending: Hematology | Admitting: Hematology

## 2020-01-08 DIAGNOSIS — Z9012 Acquired absence of left breast and nipple: Secondary | ICD-10-CM | POA: Diagnosis not present

## 2020-01-08 DIAGNOSIS — Z87891 Personal history of nicotine dependence: Secondary | ICD-10-CM | POA: Diagnosis not present

## 2020-01-08 DIAGNOSIS — Z853 Personal history of malignant neoplasm of breast: Secondary | ICD-10-CM | POA: Insufficient documentation

## 2020-01-08 DIAGNOSIS — Z800 Family history of malignant neoplasm of digestive organs: Secondary | ICD-10-CM | POA: Insufficient documentation

## 2020-01-08 DIAGNOSIS — C3412 Malignant neoplasm of upper lobe, left bronchus or lung: Secondary | ICD-10-CM

## 2020-01-08 DIAGNOSIS — D649 Anemia, unspecified: Secondary | ICD-10-CM | POA: Diagnosis not present

## 2020-01-08 DIAGNOSIS — Z902 Acquired absence of lung [part of]: Secondary | ICD-10-CM | POA: Insufficient documentation

## 2020-01-08 DIAGNOSIS — Z801 Family history of malignant neoplasm of trachea, bronchus and lung: Secondary | ICD-10-CM | POA: Insufficient documentation

## 2020-01-08 DIAGNOSIS — C3492 Malignant neoplasm of unspecified part of left bronchus or lung: Secondary | ICD-10-CM | POA: Diagnosis not present

## 2020-01-08 DIAGNOSIS — Z7952 Long term (current) use of systemic steroids: Secondary | ICD-10-CM | POA: Diagnosis not present

## 2020-01-08 DIAGNOSIS — R630 Anorexia: Secondary | ICD-10-CM | POA: Insufficient documentation

## 2020-01-08 LAB — CBC WITH DIFFERENTIAL/PLATELET
Abs Immature Granulocytes: 0.02 10*3/uL (ref 0.00–0.07)
Basophils Absolute: 0 10*3/uL (ref 0.0–0.1)
Basophils Relative: 0 %
Eosinophils Absolute: 0 10*3/uL (ref 0.0–0.5)
Eosinophils Relative: 0 %
HCT: 37.2 % (ref 36.0–46.0)
Hemoglobin: 11.4 g/dL — ABNORMAL LOW (ref 12.0–15.0)
Immature Granulocytes: 0 %
Lymphocytes Relative: 10 %
Lymphs Abs: 0.6 10*3/uL — ABNORMAL LOW (ref 0.7–4.0)
MCH: 28.1 pg (ref 26.0–34.0)
MCHC: 30.6 g/dL (ref 30.0–36.0)
MCV: 91.6 fL (ref 80.0–100.0)
Monocytes Absolute: 0.1 10*3/uL (ref 0.1–1.0)
Monocytes Relative: 2 %
Neutro Abs: 5.1 10*3/uL (ref 1.7–7.7)
Neutrophils Relative %: 88 %
Platelets: 293 10*3/uL (ref 150–400)
RBC: 4.06 MIL/uL (ref 3.87–5.11)
RDW: 22.5 % — ABNORMAL HIGH (ref 11.5–15.5)
WBC: 5.8 10*3/uL (ref 4.0–10.5)
nRBC: 0 % (ref 0.0–0.2)

## 2020-01-08 LAB — COMPREHENSIVE METABOLIC PANEL
ALT: 13 U/L (ref 0–44)
AST: 15 U/L (ref 15–41)
Albumin: 4.1 g/dL (ref 3.5–5.0)
Alkaline Phosphatase: 49 U/L (ref 38–126)
Anion gap: 10 (ref 5–15)
BUN: 17 mg/dL (ref 8–23)
CO2: 23 mmol/L (ref 22–32)
Calcium: 9 mg/dL (ref 8.9–10.3)
Chloride: 108 mmol/L (ref 98–111)
Creatinine, Ser: 0.74 mg/dL (ref 0.44–1.00)
GFR calc Af Amer: >60 mL/min (ref >60)
GFR calc non Af Amer: >60 mL/min (ref >60)
Glucose, Bld: 120 mg/dL — ABNORMAL HIGH (ref 70–99)
Potassium: 3.7 mmol/L (ref 3.5–5.1)
Sodium: 141 mmol/L (ref 135–145)
Total Bilirubin: 0.5 mg/dL (ref 0.3–1.2)
Total Protein: 6.9 g/dL (ref 6.5–8.1)

## 2020-01-08 LAB — IRON AND TIBC
Iron: 96 ug/dL (ref 28–170)
Saturation Ratios: 29 % (ref 10.4–31.8)
TIBC: 326 ug/dL (ref 250–450)
UIBC: 230 ug/dL

## 2020-01-08 LAB — VITAMIN B12: Vitamin B-12: 526 pg/mL (ref 180–914)

## 2020-01-08 LAB — FERRITIN: Ferritin: 142 ng/mL (ref 11–307)

## 2020-01-08 LAB — FOLATE: Folate: 36.4 ng/mL (ref >5.9)

## 2020-01-08 MED ORDER — IOHEXOL 300 MG/ML  SOLN
60.0000 mL | Freq: Once | INTRAMUSCULAR | Status: AC | PRN
  Administered 2020-01-08: 60 mL via INTRAVENOUS

## 2020-01-11 ENCOUNTER — Inpatient Hospital Stay (HOSPITAL_COMMUNITY): Payer: Medicare PPO | Admitting: Hematology

## 2020-01-11 ENCOUNTER — Other Ambulatory Visit: Payer: Self-pay

## 2020-01-11 DIAGNOSIS — C3490 Malignant neoplasm of unspecified part of unspecified bronchus or lung: Secondary | ICD-10-CM | POA: Diagnosis not present

## 2020-01-11 DIAGNOSIS — C3492 Malignant neoplasm of unspecified part of left bronchus or lung: Secondary | ICD-10-CM | POA: Diagnosis not present

## 2020-01-11 NOTE — Progress Notes (Signed)
[name], [place] [address]   CLINIC:  Medical Oncology/Hematology  PCP:  Alanson Puls, [facility] [place] [address] [phone]   REASON FOR VISIT:  Newly diagnosed left lung squamous cell carcinoma.     CANCER STAGING: Cancer Staging Adenocarcinoma of left breast Staging form: Breast, AJCC 7th Edition - Clinical: Stage IIA (T2, N0, cM0) - Signed by Baird Cancer, PA on 04/16/2012  Clear cell renal cell carcinoma, left (HCC) Staging form: Kidney, AJCC 8th Edition - Clinical: Stage III (cT3a, cNX, cM0) - Signed by Derek Jack, MD on 04/13/2019  Lung cancer [facility]) Staging form: Lung, AJCC 8th Edition - Clinical stage from 10/26/2019: Stage IIB (cT1b, cN1, cM0) - Signed by Derek Jack, MD on 10/26/2019    INTERVAL HISTORY:  [name] 79 y.o. female seen for follow-up of lung cancer and weight loss.  She is accompanied by her daughter today.  Gained about 4 pounds since last visit.  She has been taking prednisone 10 mg daily prescribed by Dr. Roxan Hockey.  Appetite and energy levels are 50%.  She felt slightly better after the iron infusion.  Reports occasional dizziness.  REVIEW OF SYSTEMS:  Review of Systems  Neurological: Positive for dizziness.  All other systems reviewed and are negative.    PAST MEDICAL/SURGICAL HISTORY:  Past Medical History:  Diagnosis Date  . Adenocarcinoma of left breast 04/16/2012   Stage II (T2 N0 M0) left-sided adenocarcinoma breast diagnosed in 1990 by Dr. Romona Curls and treated by Dr. Juanita Craver on NSAB PPD-19 with CMF followed by 5 years of tamoxifen and she remains disease free thus far.   . Breast cancer ([place])    left breast/mastectomy/dx approx1990  . Gall bladder stones   . Grave's disease   . High triglycerides   . History of kidney stones    small stone  rt   . Nerve compression    pinched in neck  . Pneumonia 1990  . PONV (postoperative nausea and  vomiting)    Past Surgical History:  Procedure Laterality Date  . CARPAL TUNNEL RELEASE     rt. hand  . cataract surgery     bilateral  . COLONOSCOPY    . EYE SURGERY    . LAPAROSCOPIC NEPHRECTOMY Left 03/25/2019   Procedure: LAPAROSCOPIC RADICAL NEPHRECTOMY;  Surgeon: Ceasar Mons, MD;  Location: [facility];  Service: Urology;  Laterality: Left;  Marland Kitchen MASTECTOMY     left.  Marland Kitchen NODE DISSECTION Left 09/11/2019   Procedure: Node Dissection;  Surgeon: [name], MD;  Location: [facility];  Service: Thoracic;  Laterality: Left;  . THORACOTOMY/LOBECTOMY Left 09/11/2019   Procedure: Thoracotomy/Lobectomy Left Upper Lobe;  Surgeon: [name], MD;  Location: [facility];  Service: Thoracic;  Laterality: Left;  . TONSILLECTOMY       SOCIAL HISTORY:  Social History   Socioeconomic History  . Marital status: Divorced    Spouse name: Not on file  . Number of children: Not on file  . Years of education: Not on file  . Highest education level: Not on file  Occupational History  . Not on file  Tobacco Use  . Smoking status: Former Smoker    Packs/day: 1.00    Years: 60.00    Pack years: 60.00    Types: Cigarettes    Quit date: 09/22/2019    Years since quitting: 0.3  . Smokeless tobacco: Never Used  . Tobacco comment: attempted  to stop but its expensive to quit  Substance and Sexual Activity  . Alcohol use: No  . Drug use: No  . Sexual activity: Not on file  Other Topics Concern  . Not on file  Social History Narrative  . Not on file   Social Determinants of Health   Financial Resource Strain:   . Difficulty of Paying Living Expenses:   Food Insecurity:   . Worried About Charity fundraiser in the Last Year:   . Arboriculturist in the Last Year:   Transportation Needs:   . Film/video editor (Medical):   Marland Kitchen Lack of Transportation (Non-Medical):   Physical Activity:   . Days of Exercise per Week:   . Minutes of Exercise per Session:   Stress:   .  Feeling of Stress :   Social Connections:   . Frequency of Communication with Friends and Family:   . Frequency of Social Gatherings with Friends and Family:   . Attends Religious Services:   . Active Member of Clubs or Organizations:   . Attends Archivist Meetings:   Marland Kitchen Marital Status:   Intimate Partner Violence:   . Fear of Current or Ex-Partner:   . Emotionally Abused:   Marland Kitchen Physically Abused:   . Sexually Abused:     FAMILY HISTORY:  Family History  Problem Relation Age of Onset  . Cancer Father        lung cancer  . Cancer Brother        colon cancer  . Cancer Other        lung cancer/died age 36    CURRENT MEDICATIONS:  Outpatient Encounter Medications as of 01/11/2020  Medication Sig  . aspirin 325 MG tablet Take 1 tablet (325 mg total) by mouth every 6 (six) hours as needed for moderate pain. Resume in 1 week (Patient taking differently: Take 325 mg by mouth daily. )  . ferrous sulfate 325 (65 FE) MG tablet Take 325 mg by mouth daily with breakfast.  . levothyroxine (SYNTHROID, LEVOTHROID) 100 MCG tablet Take 100 mcg by mouth daily before breakfast.   . Multiple Vitamins-Minerals (CENTRUM SILVER ULTRA WOMENS) TABS Take 1 tablet by mouth daily.   . predniSONE (DELTASONE) 10 MG tablet Take 1 tablet (10 mg total) by mouth daily with breakfast.  . simvastatin (ZOCOR) 20 MG tablet Take 20 mg by mouth at bedtime.    Marland Kitchen acetaminophen (TYLENOL) 500 MG tablet Take 500 mg by mouth every 6 (six) hours as needed for mild pain, moderate pain or headache.   . hydroxypropyl methylcellulose / hypromellose (ISOPTO TEARS / GONIOVISC) 2.5 % ophthalmic solution Place 1 drop into both eyes 3 (three) times daily as needed for dry eyes.  . meclizine (ANTIVERT) 50 MG tablet Take 25 mg by mouth 3 (three) times daily as needed for dizziness.   . megestrol (MEGACE) 40 MG tablet TAKE 2 TABLETS BY MOUTH EVERY DAY (Patient not taking: Reported on 01/05/2020)   No facility-administered  encounter medications on file as of 01/11/2020.    ALLERGIES:  Allergies  Allergen Reactions  . Chantix [Varenicline]     depression     PHYSICAL EXAM:  ECOG Performance status: 1  Vitals:   01/11/20 1509  BP: 107/69  Pulse: (!) 114  Resp: 20  Temp: (!) 97.1 F (36.2 C)  SpO2: 97%   Filed Weights   01/11/20 1509  Weight: 112 lb 6.4 oz (51 kg)    Physical Exam  Vitals reviewed.  Constitutional:      Appearance: Normal appearance.  Cardiovascular:     Rate and Rhythm: Normal rate and regular rhythm.     Heart sounds: Normal heart sounds.  Pulmonary:     Effort: Pulmonary effort is normal.     Breath sounds: Normal breath sounds.  Abdominal:     General: There is no distension.     Palpations: Abdomen is soft. There is no mass.  Musculoskeletal:        General: No swelling.  Neurological:     General: No focal deficit present.     Mental Status: She is alert and oriented to person, place, and time.  Psychiatric:        Mood and Affect: Mood normal.        Behavior: Behavior normal.      LABORATORY DATA:  I have reviewed the labs as listed.  CBC    Component Value Date/Time   WBC 5.8 01/08/2020 1253   RBC 4.06 01/08/2020 1253   HGB 11.4 (L) 01/08/2020 1253   HCT 37.2 01/08/2020 1253   PLT 293 01/08/2020 1253   MCV 91.6 01/08/2020 1253   MCH 28.1 01/08/2020 1253   MCHC 30.6 01/08/2020 1253   RDW 22.5 (H) 01/08/2020 1253   LYMPHSABS 0.6 (L) 01/08/2020 1253   MONOABS 0.1 01/08/2020 1253   EOSABS 0.0 01/08/2020 1253   BASOSABS 0.0 01/08/2020 1253   CMP Latest Ref Rng & Units 01/08/2020 11/10/2019 10/13/2019  Glucose 70 - 99 mg/dL 120(H) 105(H) 114(H)  BUN 8 - 23 mg/dL 17 18 17   Creatinine 0.44 - 1.00 mg/dL 0.74 0.93 1.07(H)  Sodium 135 - 145 mmol/L 141 141 136  Potassium 3.5 - 5.1 mmol/L 3.7 3.9 3.9  Chloride 98 - 111 mmol/L 108 108 100  CO2 22 - 32 mmol/L 23 22 23   Calcium 8.9 - 10.3 mg/dL 9.0 9.2 9.2  Total Protein 6.5 - 8.1 g/dL 6.9 7.0 -  Total  Bilirubin 0.3 - 1.2 mg/dL 0.5 0.4 -  Alkaline Phos 38 - 126 U/L 49 75 -  AST 15 - 41 U/L 15 18 -  ALT 0 - 44 U/L 13 14 -       DIAGNOSTIC IMAGING:  I have independently reviewed scans and discussed with the patient.   I have reviewed Venita Lick LPN's note and agree with the documentation.  I personally performed a face-to-face visit, made revisions and my assessment and plan is as follows.    ASSESSMENT & PLAN:   Lung cancer ([place]) 1.  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  Brain MRI on 12/07/2019 with no metastatic disease.  Chronic ischemic changes seen. -Not a candidate for adjuvant chemotherapy based on poor performance status. -We reviewed results of the CT chest with contrast dated 01/08/2020 which did not show any evidence of local recurrence in the left lung.  Trace loculated medial left pleural effusion.  No evidence of metastatic disease in the chest.  Tiny medial right upper lobe 3 mm solid pulmonary nodule decreased in the interval, presumably benign. -We will plan to repeat CT scan in 4 to 6 months.  2.  Decreased appetite: -She has done well with prednisone in the past with improvement in appetite.  Hence she was started on prednisone 10 mg daily for 6 weeks by Dr. Roxan Hockey. -She gained about 4 pounds since last visit.  She was encouraged to  drink boost/Ensure. -I will see her back in 2 months for follow-up.  3.  Normocytic anemia: -Feraheme on 12/11/2019 and 12/18/2019. -We reviewed labs from 01/08/2020.  Hemoglobin improved to 11.4.  Ferritin is 142.  Percent saturation is 29.  O53 and folic acid were normal.  We will repeat labs in 2 months.   Orders placed this encounter:  Orders Placed This Encounter  Procedures  . CBC with Differential  . Iron and TIBC  . Ferritin     Derek Jack, MD [place] [phone]

## 2020-01-11 NOTE — Assessment & Plan Note (Addendum)
1.  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  Brain MRI on 12/07/2019 with no metastatic disease.  Chronic ischemic changes seen. -Not a candidate for adjuvant chemotherapy based on poor performance status. -We reviewed results of the CT chest with contrast dated 01/08/2020 which did not show any evidence of local recurrence in the left lung.  Trace loculated medial left pleural effusion.  No evidence of metastatic disease in the chest.  Tiny medial right upper lobe 3 mm solid pulmonary nodule decreased in the interval, presumably benign. -We will plan to repeat CT scan in 4 to 6 months.  2.  Decreased appetite: -She has done well with prednisone in the past with improvement in appetite.  Hence she was started on prednisone 10 mg daily for 6 weeks by Dr. Roxan Hockey. -She gained about 4 pounds since last visit.  She was encouraged to drink boost/Ensure. -I will see her back in 2 months for follow-up.  3.  Normocytic anemia: -Feraheme on 12/11/2019 and 12/18/2019. -We reviewed labs from 01/08/2020.  Hemoglobin improved to 11.4.  Ferritin is 142.  Percent saturation is 29.  W80 and folic acid were normal.  We will repeat labs in 2 months.

## 2020-01-11 NOTE — Patient Instructions (Addendum)
[facility] at [facility] Discharge Instructions  You were seen today by Dr. Katragadda. He went over your recent lab results. He will see you back in 2 months for labs and follow up.   Thank you for choosing  Cancer Center at [facility] to provide your oncology and hematology care.  To afford each patient quality time with our provider, please arrive at least 15 minutes before your scheduled appointment time.   If you have a lab appointment with the Cancer Center please come in thru the  Main Entrance and check in at the main information desk  You need to re-schedule your appointment should you arrive 10 or more minutes late.  We strive to give you quality time with our providers, and arriving late affects you and other patients whose appointments are after yours.  Also, if you no show three or more times for appointments you may be dismissed from the clinic at the providers discretion.     Again, thank you for choosing [facility].  Our hope is that these requests will decrease the amount of time that you wait before being seen by our physicians.       _____________________________________________________________  Should you have questions after your visit to [facility], please contact our office at [phone] between the hours of 8:00 a.m. and 4:30 p.m.  Voicemails left after 4:00 p.m. will not be returned until the following business day.  For prescription refill requests, have your pharmacy contact our office and allow 72 hours.    Cancer Center Support Programs:   > Cancer Support Group  2nd Tuesday of the month 1pm-2pm, Journey Room    

## 2020-02-16 ENCOUNTER — Encounter: Payer: Self-pay | Admitting: Thoracic Surgery (Cardiothoracic Vascular Surgery)

## 2020-02-16 ENCOUNTER — Other Ambulatory Visit: Payer: Self-pay

## 2020-02-16 ENCOUNTER — Ambulatory Visit: Payer: Medicare PPO | Admitting: Thoracic Surgery (Cardiothoracic Vascular Surgery)

## 2020-02-16 VITALS — BP 132/85 | HR 92 | Temp 97.3°F | Resp 18 | Ht 65.0 in | Wt 116.2 lb

## 2020-02-16 DIAGNOSIS — C3490 Malignant neoplasm of unspecified part of unspecified bronchus or lung: Secondary | ICD-10-CM

## 2020-02-16 NOTE — Progress Notes (Signed)
[address]       [name],[name] [address]             [phone]      HPI: [name] returns for a scheduled follow-up  [name] is a 79 year old woman with history of tobacco abuse, stage IIb squamous cell carcinoma of the left upper lobe, renal cell carcinoma, nephrectomy, hypothyroidism, and hypertriglyceridemia.  She had a left thoracotomy for left upper lobectomy in January.  She has had a difficult time since the surgery.  She had a lot of issues with failure to thrive and dehydration.  She was tried on Remeron but had to stop that due to confusion.  She has been treated with Megace and prednisone, both which had some impact on her appetite but it remains poor.  I saw her most recently we started her on prednisone 10 mg a day.  It was about 6 weeks ago.  She has gained about 4 pounds during that time.  She continues to have some pain at her surgical site.  Past Medical History:  Diagnosis Date  . Adenocarcinoma of left breast 04/16/2012   Stage II (T2 N0 M0) left-sided adenocarcinoma breast diagnosed in 1990 by Dr. Romona Tyler and treated by Dr. Juanita Tyler on NSAB PPD-19 with CMF followed by 5 years of tamoxifen and she remains disease free thus far.   . Breast cancer ([name])    left breast/mastectomy/dx approx1990  . Gall bladder stones   . Grave's disease   . High triglycerides   . History of kidney stones    small stone  rt   . Nerve compression    pinched in neck  . Pneumonia 1990  . PONV (postoperative nausea and vomiting)     Current Outpatient Medications  Medication Sig Dispense Refill  . acetaminophen (TYLENOL) 500 MG tablet Take 500 mg by mouth every 6 (six) hours as needed for mild pain, moderate pain or headache.     Marland Kitchen aspirin 325 MG tablet Take 1 tablet (325 mg total) by mouth every 6 (six) hours as needed for moderate pain. Resume in 1 week (Patient taking differently: Take 325 mg by mouth daily. )    . hydroxypropyl methylcellulose /  hypromellose (ISOPTO TEARS / GONIOVISC) 2.5 % ophthalmic solution Place 1 drop into both eyes 3 (three) times daily as needed for dry eyes.    Marland Kitchen levothyroxine (SYNTHROID, LEVOTHROID) 100 MCG tablet Take 100 mcg by mouth daily before breakfast.     . meclizine (ANTIVERT) 50 MG tablet Take 25 mg by mouth 3 (three) times daily as needed for dizziness.     . Multiple Vitamins-Minerals (CENTRUM SILVER ULTRA WOMENS) TABS Take 1 tablet by mouth daily.     . simvastatin (ZOCOR) 20 MG tablet Take 20 mg by mouth at bedtime.       No current facility-administered medications for this visit.    Physical Exam BP 132/85 (BP Location: Right Arm, Patient Position: Sitting, Cuff Size: Normal)   Pulse 92   Temp (!) 97.3 F (36.3 C)   Resp 18   Ht 5\' 5"  (1.651 m)   Wt 116 lb 3.2 oz (52.7 kg)   SpO2 93% Comment: RA  BMI 19.34 kg/m  Elderly woman in no acute distress Flat affect Oriented x3 with no focal deficits Lungs diminished at left base but otherwise clear Cardiac regular rate and rhythm  CT CHEST WITH CONTRAST  TECHNIQUE: Multidetector CT imaging of the chest  was performed during intravenous contrast administration.  CONTRAST:  4mL OMNIPAQUE IOHEXOL 300 MG/ML  SOLN  COMPARISON:  07/27/2019 PET-CT. 07/08/2019 CT chest, abdomen and pelvis.  FINDINGS: Cardiovascular: Normal heart size. No significant pericardial effusion/thickening. Left circumflex coronary atherosclerosis. Atherosclerotic nonaneurysmal thoracic aorta. Top-normal caliber main pulmonary artery (3.0 cm diameter). No central pulmonary emboli.  Mediastinum/Nodes: Thyroid appears atrophic, without discrete nodules. Unremarkable esophagus. Surgical clips again noted in the left axilla. No axillary adenopathy. No pathologically enlarged mediastinal or hilar lymph nodes.  Lungs/Pleura: Interval left upper lobectomy. No pneumothorax. Trace loculated medial left pleural effusion. No right pleural effusion. Moderate to  severe centrilobular and paraseptal emphysema. No acute consolidative airspace disease or lung masses. Mild reticulonodular opacities at the right lung apex are unchanged, compatible with chronic pleural-parenchymal scarring. Medial right upper lobe solid 3 mm pulmonary nodule (series 4/image 45), decreased from 5 mm on 07/08/2019, presumably benign. No new significant pulmonary nodules.  Upper abdomen: Subcentimeter hypodense posterior superior right liver focus is too small to characterize and unchanged, presumably benign. Stable asymmetric thickening of the left adrenal gland without discrete nodules.  Musculoskeletal: No aggressive appearing focal osseous lesions. Left mastectomy. Moderate thoracic spondylosis.  IMPRESSION: 1. No evidence of local tumor recurrence in the left lung status post left upper lobectomy. Trace loculated medial left pleural effusion. 2. No evidence of metastatic disease in the chest. Tiny medial right upper lobe 3 mm solid pulmonary nodule is decreased in the interval, presumably benign, warranting attention on future follow-up chest CT studies. 3. Aortic Atherosclerosis (ICD10-I70.0) and Emphysema (ICD10-J43.9).   Electronically Signed   By: [name] M.D.   On: 01/08/2020 15:45 I personally reviewed the CT images from 01/08/2020.  I concur with the findings noted above  Impression: [name] is a 79 year old woman with history of tobacco abuse, stage IIb squamous cell carcinoma of the left upper lobe, renal cell carcinoma, nephrectomy, hypothyroidism, and hypertriglyceridemia.  She had a left thoracotomy for left upper lobectomy in January.  She continues to have failure to thrive.  This is likely multifactorial.  She does still have pain from her incision.  She required a thoracotomy because the node was invading her pulmonary artery.  She may always have some pain in that area.  I really think her primary issue is depression.  She has a  very flat affect and poor appetite and little energy.  We have tried Remeron but that caused confusion.  She is seeing her primary physician tomorrow and I think that it really is more in their area of expertise than mine, so I will defer to them whether to consider a different antidepressant.  Sees Dr. Delton Coombes next month.  Plan: I will plan to see her back in 6 months.  I am happy to see her back sooner if I can be of any assistance with her care.  [name], MD [facility] [phone]

## 2020-03-09 ENCOUNTER — Other Ambulatory Visit (HOSPITAL_COMMUNITY): Payer: Self-pay | Admitting: Internal Medicine

## 2020-03-09 DIAGNOSIS — Z1231 Encounter for screening mammogram for malignant neoplasm of breast: Secondary | ICD-10-CM

## 2020-03-14 ENCOUNTER — Inpatient Hospital Stay (HOSPITAL_COMMUNITY): Payer: Medicare PPO | Attending: Hematology

## 2020-03-14 ENCOUNTER — Other Ambulatory Visit: Payer: Self-pay

## 2020-03-14 DIAGNOSIS — C3490 Malignant neoplasm of unspecified part of unspecified bronchus or lung: Secondary | ICD-10-CM

## 2020-03-14 DIAGNOSIS — Z800 Family history of malignant neoplasm of digestive organs: Secondary | ICD-10-CM | POA: Insufficient documentation

## 2020-03-14 DIAGNOSIS — Z87891 Personal history of nicotine dependence: Secondary | ICD-10-CM | POA: Insufficient documentation

## 2020-03-14 DIAGNOSIS — R630 Anorexia: Secondary | ICD-10-CM | POA: Insufficient documentation

## 2020-03-14 DIAGNOSIS — Z7952 Long term (current) use of systemic steroids: Secondary | ICD-10-CM | POA: Insufficient documentation

## 2020-03-14 DIAGNOSIS — Z853 Personal history of malignant neoplasm of breast: Secondary | ICD-10-CM | POA: Insufficient documentation

## 2020-03-14 DIAGNOSIS — C3412 Malignant neoplasm of upper lobe, left bronchus or lung: Secondary | ICD-10-CM | POA: Diagnosis present

## 2020-03-14 DIAGNOSIS — Z902 Acquired absence of lung [part of]: Secondary | ICD-10-CM | POA: Diagnosis not present

## 2020-03-14 DIAGNOSIS — D649 Anemia, unspecified: Secondary | ICD-10-CM | POA: Insufficient documentation

## 2020-03-14 DIAGNOSIS — Z801 Family history of malignant neoplasm of trachea, bronchus and lung: Secondary | ICD-10-CM | POA: Insufficient documentation

## 2020-03-14 DIAGNOSIS — Z9012 Acquired absence of left breast and nipple: Secondary | ICD-10-CM | POA: Insufficient documentation

## 2020-03-14 LAB — CBC WITH DIFFERENTIAL/PLATELET
Abs Immature Granulocytes: 0.01 10*3/uL (ref 0.00–0.07)
Basophils Absolute: 0.1 10*3/uL (ref 0.0–0.1)
Basophils Relative: 1 %
Eosinophils Absolute: 0.1 10*3/uL (ref 0.0–0.5)
Eosinophils Relative: 2 %
HCT: 30.8 % — ABNORMAL LOW (ref 36.0–46.0)
Hemoglobin: 9.8 g/dL — ABNORMAL LOW (ref 12.0–15.0)
Immature Granulocytes: 0 %
Lymphocytes Relative: 29 %
Lymphs Abs: 2 10*3/uL (ref 0.7–4.0)
MCH: 31.2 pg (ref 26.0–34.0)
MCHC: 31.8 g/dL (ref 30.0–36.0)
MCV: 98.1 fL (ref 80.0–100.0)
Monocytes Absolute: 0.4 10*3/uL (ref 0.1–1.0)
Monocytes Relative: 6 %
Neutro Abs: 4.5 10*3/uL (ref 1.7–7.7)
Neutrophils Relative %: 62 %
Platelets: 413 10*3/uL — ABNORMAL HIGH (ref 150–400)
RBC: 3.14 MIL/uL — ABNORMAL LOW (ref 3.87–5.11)
RDW: 14 % (ref 11.5–15.5)
WBC: 7 10*3/uL (ref 4.0–10.5)
nRBC: 0 % (ref 0.0–0.2)

## 2020-03-14 LAB — IRON AND TIBC
Iron: 34 ug/dL (ref 28–170)
Saturation Ratios: 9 % — ABNORMAL LOW (ref 10.4–31.8)
TIBC: 389 ug/dL (ref 250–450)
UIBC: 355 ug/dL

## 2020-03-14 LAB — FERRITIN: Ferritin: 13 ng/mL (ref 11–307)

## 2020-03-21 ENCOUNTER — Other Ambulatory Visit: Payer: Self-pay

## 2020-03-21 ENCOUNTER — Inpatient Hospital Stay (HOSPITAL_COMMUNITY): Payer: Medicare PPO | Admitting: Hematology

## 2020-03-21 VITALS — BP 131/66 | HR 92 | Temp 96.9°F | Resp 16 | Wt 120.3 lb

## 2020-03-21 DIAGNOSIS — C3490 Malignant neoplasm of unspecified part of unspecified bronchus or lung: Secondary | ICD-10-CM | POA: Diagnosis not present

## 2020-03-21 DIAGNOSIS — C3412 Malignant neoplasm of upper lobe, left bronchus or lung: Secondary | ICD-10-CM

## 2020-03-21 NOTE — Patient Instructions (Signed)
[place] at [facility] Discharge Instructions  You were seen today by Dr. Delton Coombes. He went over your recent results. You will be scheduled for 2 iron infusions. You will also be scheduled for a CT scan of your chest before your next visit. Dr. Delton Coombes will see you back in 2 months for labs and follow up.   Thank you for choosing [place] at [facility] to provide your oncology and hematology care.  To afford each patient quality time with our provider, please arrive at least 15 minutes before your scheduled appointment time.   If you have a lab appointment with the [facility] please come in thru the Main Entrance and check in at the main information desk  You need to re-schedule your appointment should you arrive 10 or more minutes late.  We strive to give you quality time with our providers, and arriving late affects you and other patients whose appointments are after yours.  Also, if you no show three or more times for appointments you may be dismissed from the clinic at the providers discretion.     Again, thank you for choosing [facility].  Our hope is that these requests will decrease the amount of time that you wait before being seen by our physicians.       _____________________________________________________________  Should you have questions after your visit to [facility], please contact our office at [phone] between the hours of 8:00 a.m. and 4:30 p.m.  Voicemails left after 4:00 p.m. will not be returned until the following business day.  For prescription refill requests, have your pharmacy contact our office and allow 72 hours.    Cancer Center Support Programs:   > Cancer Support Group  2nd Tuesday of the month 1pm-2pm, Journey Room

## 2020-03-21 NOTE — Progress Notes (Signed)
[place], [place] [address]   CLINIC:  Medical Oncology/Hematology  PCP:  Alanson Puls, [facility] [place]. / [place] [address] [phone]   REASON FOR VISIT:  Follow-up for squamous cell carcinoma of left lung  PRIOR THERAPY: Left upper lobectomy on 09/11/2019  NGS Results: Not done  CURRENT THERAPY: Under work-up  BRIEF ONCOLOGIC HISTORY:  Oncology History  Clear cell renal cell carcinoma, left ([place])  04/13/2019 Initial Diagnosis   Clear cell renal cell carcinoma, left ([place])   04/13/2019 Cancer Staging   Staging form: Kidney, AJCC 8th Edition - Clinical: Stage III (cT3a, cNX, cM0) - Signed by Derek Jack, MD on 04/13/2019   Lung cancer ([place])  09/11/2019 Initial Diagnosis   Lung cancer ([place])   10/26/2019 Cancer Staging   Staging form: Lung, AJCC 8th Edition - Clinical stage from 10/26/2019: Stage IIB (cT1b, cN1, cM0) - Signed by Derek Jack, MD on 10/26/2019     CANCER STAGING: Cancer Staging Adenocarcinoma of left breast Staging form: Breast, AJCC 7th Edition - Clinical: Stage IIA (T2, N0, cM0) - Signed by Baird Cancer, PA on 04/16/2012  Clear cell renal cell carcinoma, left ([facility]) Staging form: Kidney, AJCC 8th Edition - Clinical: Stage III (cT3a, cNX, cM0) - Signed by Derek Jack, MD on 04/13/2019  Lung cancer [facility]) Staging form: Lung, AJCC 8th Edition - Clinical stage from 10/26/2019: Stage IIB (cT1b, cN1, cM0) - Signed by Derek Jack, MD on 10/26/2019   INTERVAL HISTORY:  [name], a 79 y.o. female, returns for routine follow-up of her squamous cell carcinoma of left lung. Cerena was last seen on 01/11/2020.  Today she is accompanied by her daughter. She reports that she is eating well and gaining weight. She denies having any melena, hematochezia, or hematuria. She is no longer taking prednisone. She tolerated the iron infusions well. She still feels weak and  uses her cane, but she has improved substantially.   REVIEW OF SYSTEMS:  Review of Systems  Constitutional: Positive for appetite change (mildly decreased) and fatigue (severe).  Respiratory: Negative for cough.   Gastrointestinal: Negative for blood in stool.  Genitourinary: Negative for hematuria.   Neurological: Positive for dizziness (occasional) and headaches (occasional).  All other systems reviewed and are negative.   PAST MEDICAL/SURGICAL HISTORY:  Past Medical History:  Diagnosis Date  . Adenocarcinoma of left breast 04/16/2012   Stage II (T2 N0 M0) left-sided adenocarcinoma breast diagnosed in 1990 by Dr. Romona Curls and treated by Dr. Juanita Craver on NSAB PPD-19 with CMF followed by 5 years of tamoxifen and she remains disease free thus far.   . Breast cancer ([place])    left breast/mastectomy/dx approx1990  . Gall bladder stones   . Grave's disease   . High triglycerides   . History of kidney stones    small stone  rt   . Nerve compression    pinched in neck  . Pneumonia 1990  . PONV (postoperative nausea and vomiting)    Past Surgical History:  Procedure Laterality Date  . CARPAL TUNNEL RELEASE     rt. hand  . cataract surgery     bilateral  . COLONOSCOPY    . EYE SURGERY    . LAPAROSCOPIC NEPHRECTOMY Left 03/25/2019   Procedure: LAPAROSCOPIC RADICAL NEPHRECTOMY;  Surgeon: Ceasar Mons, MD;  Location: [facility];  Service: Urology;  Laterality: Left;  Marland Kitchen MASTECTOMY     left.  Marland Kitchen NODE DISSECTION  Left 09/11/2019   Procedure: Node Dissection;  Surgeon: [name], MD;  Location: [facility];  Service: Thoracic;  Laterality: Left;  . THORACOTOMY/LOBECTOMY Left 09/11/2019   Procedure: Thoracotomy/Lobectomy Left Upper Lobe;  Surgeon: [name], MD;  Location: [facility];  Service: Thoracic;  Laterality: Left;  . TONSILLECTOMY      SOCIAL HISTORY:  Social History   Socioeconomic History  . Marital status: Divorced    Spouse name: Not on file  .  Number of children: Not on file  . Years of education: Not on file  . Highest education level: Not on file  Occupational History  . Not on file  Tobacco Use  . Smoking status: Former Smoker    Packs/day: 1.00    Years: 60.00    Pack years: 60.00    Types: Cigarettes    Quit date: 09/22/2019    Years since quitting: 0.4  . Smokeless tobacco: Never Used  . Tobacco comment: attempted to stop but its expensive to quit  Vaping Use  . Vaping Use: Never used  Substance and Sexual Activity  . Alcohol use: No  . Drug use: No  . Sexual activity: Not on file  Other Topics Concern  . Not on file  Social History Narrative  . Not on file   Social Determinants of Health   Financial Resource Strain:   . Difficulty of Paying Living Expenses:   Food Insecurity:   . Worried About Charity fundraiser in the Last Year:   . Arboriculturist in the Last Year:   Transportation Needs:   . Film/video editor (Medical):   Marland Kitchen Lack of Transportation (Non-Medical):   Physical Activity:   . Days of Exercise per Week:   . Minutes of Exercise per Session:   Stress:   . Feeling of Stress :   Social Connections:   . Frequency of Communication with Friends and Family:   . Frequency of Social Gatherings with Friends and Family:   . Attends Religious Services:   . Active Member of Clubs or Organizations:   . Attends Archivist Meetings:   Marland Kitchen Marital Status:   Intimate Partner Violence:   . Fear of Current or Ex-Partner:   . Emotionally Abused:   Marland Kitchen Physically Abused:   . Sexually Abused:     FAMILY HISTORY:  Family History  Problem Relation Age of Onset  . Cancer Father        lung cancer  . Cancer Brother        colon cancer  . Cancer Other        lung cancer/died age 28    CURRENT MEDICATIONS:  Current Outpatient Medications  Medication Sig Dispense Refill  . cholecalciferol (VITAMIN D3) 25 MCG (1000 UNIT) tablet Take 1,000 Units by mouth 2 (two) times daily.    Marland Kitchen  levothyroxine (SYNTHROID, LEVOTHROID) 100 MCG tablet Take 100 mcg by mouth daily before breakfast.     . Multiple Vitamins-Minerals (CENTRUM SILVER ULTRA WOMENS) TABS Take 1 tablet by mouth daily.     . simvastatin (ZOCOR) 40 MG tablet Take 40 mg by mouth daily at 6 PM.     . acetaminophen (TYLENOL) 500 MG tablet Take 500 mg by mouth every 6 (six) hours as needed for mild pain, moderate pain or headache.  (Patient not taking: Reported on 03/21/2020)    . aspirin 325 MG tablet Take 1 tablet (325 mg total) by mouth every 6 (six) hours  as needed for moderate pain. Resume in 1 week (Patient not taking: Reported on 03/21/2020)    . hydroxypropyl methylcellulose / hypromellose (ISOPTO TEARS / GONIOVISC) 2.5 % ophthalmic solution Place 1 drop into both eyes 3 (three) times daily as needed for dry eyes. (Patient not taking: Reported on 03/21/2020)    . meclizine (ANTIVERT) 50 MG tablet Take 25 mg by mouth 3 (three) times daily as needed for dizziness.  (Patient not taking: Reported on 03/21/2020)     No current facility-administered medications for this visit.    ALLERGIES:  Allergies  Allergen Reactions  . Chantix [Varenicline]     depression    PHYSICAL EXAM:  Performance status (ECOG): 1 - Symptomatic but completely ambulatory  Vitals:   03/21/20 1454  BP: 131/66  Pulse: 92  Resp: 16  Temp: (!) 96.9 F (36.1 C)  SpO2: 97%   Wt Readings from Last 3 Encounters:  03/21/20 120 lb 4.8 oz (54.6 kg)  02/16/20 116 lb 3.2 oz (52.7 kg)  01/11/20 112 lb 6.4 oz (51 kg)   Physical Exam Vitals reviewed.  Constitutional:      Appearance: Normal appearance.  Cardiovascular:     Rate and Rhythm: Normal rate and regular rhythm.     Pulses: Normal pulses.     Heart sounds: Normal heart sounds.  Pulmonary:     Effort: Pulmonary effort is normal.     Breath sounds: Normal breath sounds.  Abdominal:     Palpations: Abdomen is soft.     Tenderness: There is no abdominal tenderness.  Musculoskeletal:      Right lower leg: No edema.     Left lower leg: No edema.  Neurological:     General: No focal deficit present.     Mental Status: She is alert and oriented to person, place, and time.  Psychiatric:        Mood and Affect: Mood normal.        Behavior: Behavior normal.      LABORATORY DATA:  I have reviewed the labs as listed.  CBC Latest Ref Rng & Units 03/14/2020 01/08/2020 11/10/2019  WBC 4.0 - 10.5 K/uL 7.0 5.8 6.3  Hemoglobin 12.0 - 15.0 g/dL 9.8(L) 11.4(L) 8.3(L)  Hematocrit 36 - 46 % 30.8(L) 37.2 28.7(L)  Platelets 150 - 400 K/uL 413(H) 293 373   CMP Latest Ref Rng & Units 01/08/2020 11/10/2019 10/13/2019  Glucose 70 - 99 mg/dL 120(H) 105(H) 114(H)  BUN 8 - 23 mg/dL 17 18 17   Creatinine 0.44 - 1.00 mg/dL 0.74 0.93 1.07(H)  Sodium 135 - 145 mmol/L 141 141 136  Potassium 3.5 - 5.1 mmol/L 3.7 3.9 3.9  Chloride 98 - 111 mmol/L 108 108 100  CO2 22 - 32 mmol/L 23 22 23   Calcium 8.9 - 10.3 mg/dL 9.0 9.2 9.2  Total Protein 6.5 - 8.1 g/dL 6.9 7.0 -  Total Bilirubin 0.3 - 1.2 mg/dL 0.5 0.4 -  Alkaline Phos 38 - 126 U/L 49 75 -  AST 15 - 41 U/L 15 18 -  ALT 0 - 44 U/L 13 14 -   Lab Results  Component Value Date   TIBC 389 03/14/2020   TIBC 326 01/08/2020   TIBC 413 11/10/2019   FERRITIN 13 03/14/2020   FERRITIN 142 01/08/2020   FERRITIN 10 (L) 11/10/2019   IRONPCTSAT 9 (L) 03/14/2020   IRONPCTSAT 29 01/08/2020   IRONPCTSAT 10 (L) 11/10/2019     DIAGNOSTIC IMAGING:  I have independently reviewed the  scans and discussed with the patient. No results found.   ASSESSMENT:  1.  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 -Brain MRI on 12/07/2019 with no metastatic disease. -Adjuvant chemotherapy was not offered based on poor performance status and weight loss. -CT chest on 01/08/2020 did not show any evidence of local recurrence left lung.  Trace loculated medial left pleural effusion.  No evidence of metastatic  disease in the chest.  Tiny medial right upper lobe 3 mm solid pulmonary nodule decreased in the interval, presumably benign.  2.  Decreased appetite: -She has lost considerable weight after surgery and was treated with prednisone and Megace. -She is currently off of medications.  3.  Normocytic anemia: -Last Feraheme on 03/25/2020.   PLAN:  1.  Stage IIb (T1BN1) squamous cell carcinoma of the left upper lobe of the lung: -Her breathing is improving.  No cough or hemoptysis. -I have recommended a repeat CT scan of the chest in 2 months.  2.  Decreased appetite: -Her appetite has improved.  She has been off of prednisone at this time.  Her weight improved by 5 pounds.  3.  Normocytic anemia: -I reviewed labs from 03/14/2020.  Hemoglobin is 9.8.  MCV is 98.  Ferritin decreased to 13 and percent saturation of 9. -I have recommended Feraheme weekly x2.  We will repeat iron panel at next visit in 2 months.    Orders placed this encounter:  No orders of the defined types were placed in this encounter.    Derek Jack, MD [place] [phone]   I, Milinda Antis, am acting as a scribe for Dr. Sanda Linger.  I, Derek Jack MD, have reviewed the above documentation for accuracy and completeness, and I agree with the above.

## 2020-03-25 ENCOUNTER — Inpatient Hospital Stay (HOSPITAL_COMMUNITY): Payer: Medicare PPO

## 2020-03-25 ENCOUNTER — Other Ambulatory Visit: Payer: Self-pay

## 2020-03-25 ENCOUNTER — Encounter (HOSPITAL_COMMUNITY): Payer: Self-pay

## 2020-03-25 VITALS — BP 111/57 | HR 79 | Temp 97.1°F | Resp 18

## 2020-03-25 DIAGNOSIS — D508 Other iron deficiency anemias: Secondary | ICD-10-CM

## 2020-03-25 DIAGNOSIS — C3412 Malignant neoplasm of upper lobe, left bronchus or lung: Secondary | ICD-10-CM | POA: Diagnosis not present

## 2020-03-25 MED ORDER — SODIUM CHLORIDE 0.9 % IV SOLN: Freq: Once | INTRAVENOUS | Status: AC

## 2020-03-25 MED ORDER — ONDANSETRON HCL 4 MG/2ML IJ SOLN
4.0000 mg | Freq: Once | INTRAMUSCULAR | Status: AC
  Administered 2020-03-25: 4 mg via INTRAVENOUS

## 2020-03-25 MED ORDER — SODIUM CHLORIDE 0.9 % IV SOLN
510.0000 mg | Freq: Once | INTRAVENOUS | Status: AC
  Administered 2020-03-25: 510 mg via INTRAVENOUS
  Filled 2020-03-25: qty 510

## 2020-03-25 MED ORDER — ONDANSETRON HCL 4 MG/2ML IJ SOLN
INTRAMUSCULAR | Status: AC
  Filled 2020-03-25: qty 2

## 2020-03-25 NOTE — Progress Notes (Signed)
Patient presents today for second dose of Feraheme. Vital signs stable. Patient denies pain today. MAR reviewed and updated. Patient has no complaints of any significant changes since her last treatment. Patient states her fatigue has improved slightly since her last visit per patient's words.   Treatment given today per MD orders. Tolerated infusion without adverse affects. Vital signs stable. No complaints at this time. Discharged from clinic ambulatory. F/U with [facility] as scheduled.

## 2020-03-25 NOTE — Patient Instructions (Signed)
[facility] at [facility]  Discharge Instructions:   _______________________________________________________________  Thank you for choosing [facility] at [facility] to provide your oncology and hematology care.  To afford each patient quality time with our providers, please arrive at least 15 minutes before your scheduled appointment.  You need to re-schedule your appointment if you arrive 10 or more minutes late.  We strive to give you quality time with our providers, and arriving late affects you and other patients whose appointments are after yours.  Also, if you no show three or more times for appointments you may be dismissed from the clinic.  Again, thank you for choosing [facility] at [facility]. Our hope is that these requests will allow you access to exceptional care and in a timely manner. _______________________________________________________________  If you have questions after your visit, please contact our office at [phone] between the hours of 8:30 a.m. and 5:00 p.m. Voicemails left after 4:30 p.m. will not be returned until the following business day. _______________________________________________________________  For prescription refill requests, have your pharmacy contact our office. _______________________________________________________________  Recommendations made by the consultant and any test results will be sent to your referring physician. _______________________________________________________________ 

## 2020-04-01 ENCOUNTER — Encounter (HOSPITAL_COMMUNITY): Payer: Self-pay

## 2020-04-01 ENCOUNTER — Inpatient Hospital Stay (HOSPITAL_COMMUNITY): Payer: Medicare PPO

## 2020-04-01 ENCOUNTER — Other Ambulatory Visit: Payer: Self-pay

## 2020-04-01 VITALS — BP 114/71 | HR 74 | Temp 97.2°F | Resp 16

## 2020-04-01 DIAGNOSIS — C3412 Malignant neoplasm of upper lobe, left bronchus or lung: Secondary | ICD-10-CM | POA: Diagnosis not present

## 2020-04-01 DIAGNOSIS — D508 Other iron deficiency anemias: Secondary | ICD-10-CM

## 2020-04-01 MED ORDER — SODIUM CHLORIDE 0.9 % IV SOLN: Freq: Once | INTRAVENOUS | Status: AC

## 2020-04-01 MED ORDER — ONDANSETRON HCL 4 MG/2ML IJ SOLN
4.0000 mg | Freq: Once | INTRAMUSCULAR | Status: AC
  Administered 2020-04-01: 4 mg via INTRAVENOUS

## 2020-04-01 MED ORDER — ONDANSETRON HCL 4 MG/2ML IJ SOLN
INTRAMUSCULAR | Status: AC
  Filled 2020-04-01: qty 2

## 2020-04-01 MED ORDER — SODIUM CHLORIDE 0.9 % IV SOLN
510.0000 mg | Freq: Once | INTRAVENOUS | Status: AC
  Administered 2020-04-01: 510 mg via INTRAVENOUS
  Filled 2020-04-01: qty 510

## 2020-04-01 NOTE — Progress Notes (Signed)
Feraheme given today per MD orders. Tolerated infusion without adverse affects. Vital signs stable. No complaints at this time. Discharged from clinic ambulatory. F/U with [facility] as scheduled.  

## 2020-04-01 NOTE — Patient Instructions (Signed)
[facility] at [facility]  Discharge Instructions:   _______________________________________________________________  Thank you for choosing [facility] at [facility] to provide your oncology and hematology care.  To afford each patient quality time with our providers, please arrive at least 15 minutes before your scheduled appointment.  You need to re-schedule your appointment if you arrive 10 or more minutes late.  We strive to give you quality time with our providers, and arriving late affects you and other patients whose appointments are after yours.  Also, if you no show three or more times for appointments you may be dismissed from the clinic.  Again, thank you for choosing [facility] at [facility]. Our hope is that these requests will allow you access to exceptional care and in a timely manner. _______________________________________________________________  If you have questions after your visit, please contact our office at [phone] between the hours of 8:30 a.m. and 5:00 p.m. Voicemails left after 4:30 p.m. will not be returned until the following business day. _______________________________________________________________  For prescription refill requests, have your pharmacy contact our office. _______________________________________________________________  Recommendations made by the consultant and any test results will be sent to your referring physician. _______________________________________________________________ 

## 2020-04-04 ENCOUNTER — Other Ambulatory Visit: Payer: Self-pay

## 2020-04-04 ENCOUNTER — Ambulatory Visit (HOSPITAL_COMMUNITY)
Admission: RE | Admit: 2020-04-04 | Discharge: 2020-04-04 | Disposition: A | Discharge status: Home / Self Care | Payer: Medicare PPO | Source: Ambulatory Visit | Attending: Internal Medicine | Admitting: Internal Medicine

## 2020-04-04 DIAGNOSIS — Z1231 Encounter for screening mammogram for malignant neoplasm of breast: Secondary | ICD-10-CM | POA: Diagnosis present

## 2020-06-21 ENCOUNTER — Ambulatory Visit (HOSPITAL_COMMUNITY)
Admission: RE | Admit: 2020-06-21 | Discharge: 2020-06-21 | Disposition: A | Discharge status: Home / Self Care | Payer: Medicare PPO | Source: Ambulatory Visit | Attending: Hematology | Admitting: Hematology

## 2020-06-21 ENCOUNTER — Other Ambulatory Visit: Payer: Self-pay

## 2020-06-21 ENCOUNTER — Inpatient Hospital Stay (HOSPITAL_COMMUNITY): Payer: Medicare PPO | Attending: Hematology

## 2020-06-21 DIAGNOSIS — J90 Pleural effusion, not elsewhere classified: Secondary | ICD-10-CM | POA: Diagnosis not present

## 2020-06-21 DIAGNOSIS — D649 Anemia, unspecified: Secondary | ICD-10-CM | POA: Insufficient documentation

## 2020-06-21 DIAGNOSIS — C3412 Malignant neoplasm of upper lobe, left bronchus or lung: Secondary | ICD-10-CM | POA: Diagnosis present

## 2020-06-21 DIAGNOSIS — Z87891 Personal history of nicotine dependence: Secondary | ICD-10-CM | POA: Insufficient documentation

## 2020-06-21 LAB — COMPREHENSIVE METABOLIC PANEL
ALT: 17 U/L (ref 0–44)
AST: 24 U/L (ref 15–41)
Albumin: 4.5 g/dL (ref 3.5–5.0)
Alkaline Phosphatase: 62 U/L (ref 38–126)
Anion gap: 11 (ref 5–15)
BUN: 33 mg/dL — ABNORMAL HIGH (ref 8–23)
CO2: 26 mmol/L (ref 22–32)
Calcium: 9.5 mg/dL (ref 8.9–10.3)
Chloride: 104 mmol/L (ref 98–111)
Creatinine, Ser: 0.89 mg/dL (ref 0.44–1.00)
GFR, Estimated: >60 mL/min (ref >60)
Glucose, Bld: 96 mg/dL (ref 70–99)
Potassium: 4.7 mmol/L (ref 3.5–5.1)
Sodium: 141 mmol/L (ref 135–145)
Total Bilirubin: 0.6 mg/dL (ref 0.3–1.2)
Total Protein: 7.8 g/dL (ref 6.5–8.1)

## 2020-06-21 LAB — CBC WITH DIFFERENTIAL/PLATELET
Abs Immature Granulocytes: 0.02 10*3/uL (ref 0.00–0.07)
Basophils Absolute: 0.1 10*3/uL (ref 0.0–0.1)
Basophils Relative: 1 %
Eosinophils Absolute: 0.2 10*3/uL (ref 0.0–0.5)
Eosinophils Relative: 3 %
HCT: 36.9 % (ref 36.0–46.0)
Hemoglobin: 11.5 g/dL — ABNORMAL LOW (ref 12.0–15.0)
Immature Granulocytes: 0 %
Lymphocytes Relative: 20 %
Lymphs Abs: 1.5 10*3/uL (ref 0.7–4.0)
MCH: 28.8 pg (ref 26.0–34.0)
MCHC: 31.2 g/dL (ref 30.0–36.0)
MCV: 92.3 fL (ref 80.0–100.0)
Monocytes Absolute: 0.4 10*3/uL (ref 0.1–1.0)
Monocytes Relative: 6 %
Neutro Abs: 5.1 10*3/uL (ref 1.7–7.7)
Neutrophils Relative %: 70 %
Platelets: 315 10*3/uL (ref 150–400)
RBC: 4 MIL/uL (ref 3.87–5.11)
RDW: 13.6 % (ref 11.5–15.5)
WBC: 7.3 10*3/uL (ref 4.0–10.5)
nRBC: 0 % (ref 0.0–0.2)

## 2020-06-21 LAB — FERRITIN: Ferritin: 9 ng/mL — ABNORMAL LOW (ref 11–307)

## 2020-06-21 LAB — IRON AND TIBC
Iron: 27 ug/dL — ABNORMAL LOW (ref 28–170)
Saturation Ratios: 6 % — ABNORMAL LOW (ref 10.4–31.8)
TIBC: 488 ug/dL — ABNORMAL HIGH (ref 250–450)
UIBC: 461 ug/dL

## 2020-06-21 MED ORDER — IOHEXOL 300 MG/ML  SOLN
75.0000 mL | Freq: Once | INTRAMUSCULAR | Status: AC | PRN
  Administered 2020-06-21: 75 mL via INTRAVENOUS

## 2020-06-28 ENCOUNTER — Inpatient Hospital Stay (HOSPITAL_COMMUNITY): Payer: Medicare PPO | Admitting: Hematology

## 2020-06-28 ENCOUNTER — Other Ambulatory Visit: Payer: Self-pay

## 2020-06-28 VITALS — BP 129/72 | HR 85 | Temp 97.3°F | Resp 18 | Wt 139.8 lb

## 2020-06-28 DIAGNOSIS — C3492 Malignant neoplasm of unspecified part of left bronchus or lung: Secondary | ICD-10-CM | POA: Diagnosis not present

## 2020-06-28 DIAGNOSIS — C3412 Malignant neoplasm of upper lobe, left bronchus or lung: Secondary | ICD-10-CM | POA: Diagnosis not present

## 2020-06-28 NOTE — Patient Instructions (Signed)
[place] at [facility] Discharge Instructions  You were seen today by Dr. Delton Coombes. He went over your recent results. Stop taking the aspirin 325 mg. Purchase iron tablets over the counter and take 1 tablet daily; purchase stool softener and take daily to prevent constipation. Dr. Delton Coombes will see you back in 3 months for labs and follow up.   Thank you for choosing [place] at [facility] to provide your oncology and hematology care.  To afford each patient quality time with our provider, please arrive at least 15 minutes before your scheduled appointment time.   If you have a lab appointment with the [facility] please come in thru the Main Entrance and check in at the main information desk  You need to re-schedule your appointment should you arrive 10 or more minutes late.  We strive to give you quality time with our providers, and arriving late affects you and other patients whose appointments are after yours.  Also, if you no show three or more times for appointments you may be dismissed from the clinic at the providers discretion.     Again, thank you for choosing [facility].  Our hope is that these requests will decrease the amount of time that you wait before being seen by our physicians.       _____________________________________________________________  Should you have questions after your visit to [facility], please contact our office at [phone] between the hours of 8:00 a.m. and 4:30 p.m.  Voicemails left after 4:00 p.m. will not be returned until the following business day.  For prescription refill requests, have your pharmacy contact our office and allow 72 hours.    Cancer Center Support Programs:   > Cancer Support Group  2nd Tuesday of the month 1pm-2pm, Journey Room

## 2020-06-28 NOTE — Progress Notes (Signed)
[place], [place] [address]   CLINIC:  Medical Oncology/Hematology  PCP:  Alanson Puls, [facility] / [place] [address] [phone]   REASON FOR VISIT:  Follow-up for squamous cell carcinoma of left lung  PRIOR THERAPY: Left upper lobe lobectomy on 09/11/2019  NGS Results: Not done  CURRENT THERAPY: Intermittent Feraheme last on 04/01/2020  BRIEF ONCOLOGIC HISTORY:  Oncology History  Clear cell renal cell carcinoma, left ([facility])  04/13/2019 Initial Diagnosis   Clear cell renal cell carcinoma, left ([facility])   04/13/2019 Cancer Staging   Staging form: Kidney, AJCC 8th Edition - Clinical: Stage III (cT3a, cNX, cM0) - Signed by Derek Jack, MD on 04/13/2019   Lung cancer ([place])  09/11/2019 Initial Diagnosis   Lung cancer ([place])   10/26/2019 Cancer Staging   Staging form: Lung, AJCC 8th Edition - Clinical stage from 10/26/2019: Stage IIB (cT1b, cN1, cM0) - Signed by Derek Jack, MD on 10/26/2019     CANCER STAGING: Cancer Staging Adenocarcinoma of left breast Staging form: Breast, AJCC 7th Edition - Clinical: Stage IIA (T2, N0, cM0) - Signed by Baird Cancer, PA on 04/16/2012  Clear cell renal cell carcinoma, left ([facility]) Staging form: Kidney, AJCC 8th Edition - Clinical: Stage III (cT3a, cNX, cM0) - Signed by Derek Jack, MD on 04/13/2019  Lung cancer [facility]) Staging form: Lung, AJCC 8th Edition - Clinical stage from 10/26/2019: Stage IIB (cT1b, cN1, cM0) - Signed by Derek Jack, MD on 10/26/2019   INTERVAL HISTORY:  [name], a 79 y.o. female, returns for routine follow-up of her squamous cell carcinoma of left lung. Makendra was last seen on 03/21/2020.   Today she is accompanied by her daughter and she reports feeling okay. She takes an ASA 325 for her back pain daily and she denies having nosebleeds, hematochezia or hematuria. She reports having low energy for 1 week after  getting Feraheme. She complains of sharp, stabbing pain in the bottom of her front rib cage but it does not hurt when touched. She denies having MI's or CVA's. Her appetite is good and she is trying to eat everything that contains iron; she eats 2 meals a day as well as snacking throughout the day.  She quit smoking on 09/11/2019.   REVIEW OF SYSTEMS:  Review of Systems  Constitutional: Positive for fatigue (60%). Negative for appetite change.  Respiratory: Positive for cough.   Cardiovascular: Positive for chest pain (8/10 rib pain).  All other systems reviewed and are negative.   PAST MEDICAL/SURGICAL HISTORY:  Past Medical History:  Diagnosis Date  . Adenocarcinoma of left breast 04/16/2012   Stage II (T2 N0 M0) left-sided adenocarcinoma breast diagnosed in 1990 by Dr. Romona Curls and treated by Dr. Juanita Craver on NSAB PPD-19 with CMF followed by 5 years of tamoxifen and she remains disease free thus far.   . Breast cancer ([facility])    left breast/mastectomy/dx approx1990  . Gall bladder stones   . Grave's disease   . High triglycerides   . History of kidney stones    small stone  rt   . Nerve compression    pinched in neck  . Pneumonia 1990  . PONV (postoperative nausea and vomiting)    Past Surgical History:  Procedure Laterality Date  . CARPAL TUNNEL RELEASE     rt. hand  . cataract surgery     bilateral  . COLONOSCOPY    . EYE SURGERY    .  LAPAROSCOPIC NEPHRECTOMY Left 03/25/2019   Procedure: LAPAROSCOPIC RADICAL NEPHRECTOMY;  Surgeon: Ceasar Mons, MD;  Location: [facility];  Service: Urology;  Laterality: Left;  Marland Kitchen MASTECTOMY     left.  Marland Kitchen NODE DISSECTION Left 09/11/2019   Procedure: Node Dissection;  Surgeon: [name], MD;  Location: [facility];  Service: Thoracic;  Laterality: Left;  . THORACOTOMY/LOBECTOMY Left 09/11/2019   Procedure: Thoracotomy/Lobectomy Left Upper Lobe;  Surgeon: [name], MD;  Location: [facility];  Service: Thoracic;  Laterality:  Left;  . TONSILLECTOMY      SOCIAL HISTORY:  Social History   Socioeconomic History  . Marital status: Divorced    Spouse name: Not on file  . Number of children: Not on file  . Years of education: Not on file  . Highest education level: Not on file  Occupational History  . Not on file  Tobacco Use  . Smoking status: Former Smoker    Packs/day: 1.00    Years: 60.00    Pack years: 60.00    Types: Cigarettes    Quit date: 09/22/2019    Years since quitting: 0.7  . Smokeless tobacco: Never Used  . Tobacco comment: attempted to stop but its expensive to quit  Vaping Use  . Vaping Use: Never used  Substance and Sexual Activity  . Alcohol use: No  . Drug use: No  . Sexual activity: Not on file  Other Topics Concern  . Not on file  Social History Narrative  . Not on file   Social Determinants of Health   Financial Resource Strain:   . Difficulty of Paying Living Expenses: Not on file  Food Insecurity:   . Worried About Charity fundraiser in the Last Year: Not on file  . Ran Out of Food in the Last Year: Not on file  Transportation Needs:   . Lack of Transportation (Medical): Not on file  . Lack of Transportation (Non-Medical): Not on file  Physical Activity:   . Days of Exercise per Week: Not on file  . Minutes of Exercise per Session: Not on file  Stress:   . Feeling of Stress : Not on file  Social Connections:   . Frequency of Communication with Friends and Family: Not on file  . Frequency of Social Gatherings with Friends and Family: Not on file  . Attends Religious Services: Not on file  . Active Member of Clubs or Organizations: Not on file  . Attends Archivist Meetings: Not on file  . Marital Status: Not on file  Intimate Partner Violence:   . Fear of Current or Ex-Partner: Not on file  . Emotionally Abused: Not on file  . Physically Abused: Not on file  . Sexually Abused: Not on file    FAMILY HISTORY:  Family History  Problem Relation Age  of Onset  . Cancer Father        lung cancer  . Cancer Brother        colon cancer  . Cancer Other        lung cancer/died age 61    CURRENT MEDICATIONS:  Current Outpatient Medications  Medication Sig Dispense Refill  . acetaminophen (TYLENOL) 500 MG tablet Take 500 mg by mouth every 6 (six) hours as needed for mild pain, moderate pain or headache.     Marland Kitchen aspirin 325 MG tablet Take 1 tablet (325 mg total) by mouth every 6 (six) hours as needed for moderate pain. Resume in 1 week    .  cholecalciferol (VITAMIN D3) 25 MCG (1000 UNIT) tablet Take 1,000 Units by mouth 2 (two) times daily.    . hydroxypropyl methylcellulose / hypromellose (ISOPTO TEARS / GONIOVISC) 2.5 % ophthalmic solution Place 1 drop into both eyes 3 (three) times daily as needed for dry eyes.     Marland Kitchen levothyroxine (SYNTHROID, LEVOTHROID) 100 MCG tablet Take 100 mcg by mouth daily before breakfast.     . meclizine (ANTIVERT) 50 MG tablet Take 25 mg by mouth 3 (three) times daily as needed for dizziness.      . Multiple Vitamins-Minerals (CENTRUM SILVER ULTRA WOMENS) TABS Take 1 tablet by mouth daily.     . simvastatin (ZOCOR) 40 MG tablet Take 40 mg by mouth daily at 6 PM.      No current facility-administered medications for this visit.    ALLERGIES:  Allergies  Allergen Reactions  . Chantix [Varenicline]     depression    PHYSICAL EXAM:  Performance status (ECOG): 1 - Symptomatic but completely ambulatory  Vitals:   06/28/20 1555  BP: 129/72  Pulse: 85  Resp: 18  Temp: (!) 97.3 F (36.3 C)  SpO2: 97%   Wt Readings from Last 3 Encounters:  06/28/20 139 lb 12.8 oz (63.4 kg)  03/21/20 120 lb 4.8 oz (54.6 kg)  02/16/20 116 lb 3.2 oz (52.7 kg)   Physical Exam Vitals reviewed.  Constitutional:      Appearance: Normal appearance.  Cardiovascular:     Rate and Rhythm: Normal rate and regular rhythm.     Pulses: Normal pulses.     Heart sounds: Normal heart sounds.  Pulmonary:     Effort: Pulmonary effort  is normal.     Breath sounds: Normal breath sounds.  Chest:     Chest wall: No tenderness.  Musculoskeletal:     Right lower leg: No edema.     Left lower leg: No edema.  Neurological:     General: No focal deficit present.     Mental Status: She is alert and oriented to person, place, and time.  Psychiatric:        Mood and Affect: Mood normal.        Behavior: Behavior normal.      LABORATORY DATA:  I have reviewed the labs as listed.  CBC Latest Ref Rng & Units 06/21/2020 03/14/2020 01/08/2020  WBC 4.0 - 10.5 K/uL 7.3 7.0 5.8  Hemoglobin 12.0 - 15.0 g/dL 11.5(L) 9.8(L) 11.4(L)  Hematocrit 36 - 46 % 36.9 30.8(L) 37.2  Platelets 150 - 400 K/uL 315 413(H) 293   CMP Latest Ref Rng & Units 06/21/2020 01/08/2020 11/10/2019  Glucose 70 - 99 mg/dL 96 120(H) 105(H)  BUN 8 - 23 mg/dL 33(H) 17 18  Creatinine 0.44 - 1.00 mg/dL 0.89 0.74 0.93  Sodium 135 - 145 mmol/L 141 141 141  Potassium 3.5 - 5.1 mmol/L 4.7 3.7 3.9  Chloride 98 - 111 mmol/L 104 108 108  CO2 22 - 32 mmol/L 26 23 22   Calcium 8.9 - 10.3 mg/dL 9.5 9.0 9.2  Total Protein 6.5 - 8.1 g/dL 7.8 6.9 7.0  Total Bilirubin 0.3 - 1.2 mg/dL 0.6 0.5 0.4  Alkaline Phos 38 - 126 U/L 62 49 75  AST 15 - 41 U/L 24 15 18   ALT 0 - 44 U/L 17 13 14     DIAGNOSTIC IMAGING:  I have independently reviewed the scans and discussed with the patient. CT Chest W Contrast  Result Date: 06/21/2020 CLINICAL DATA:  79 year old female  for follow-up squamous cell of the LEFT upper lobe post LEFT upper lobectomy in January 2021, also with history of breast cancer. EXAM: CT CHEST WITH CONTRAST TECHNIQUE: Multidetector CT imaging of the chest was performed during intravenous contrast administration. CONTRAST:  29mL OMNIPAQUE IOHEXOL 300 MG/ML  SOLN COMPARISON:  01/08/2020 and multiple prior studies. FINDINGS: Cardiovascular: Calcified and noncalcified plaque of the thoracic aorta. No aneurysmal dilation. Normal caliber central pulmonary vasculature. Distortion  of LEFT hilum secondary to LEFT upper lobectomy. Normal heart size without pericardial effusion or nodularity. Mediastinum/Nodes: Thoracic inlet structures are normal. Post LEFT axillary dissection and LEFT mastectomy. No axillary lymphadenopathy no mediastinal lymphadenopathy or hilar lymphadenopathy. Lungs/Pleura: Signs of pulmonary emphysema both paraseptal and centrilobular and worse at the lung apices no consolidation. No pleural effusion. Airways are patent. Post LEFT upper lobectomy as before. Minimal scarring along the LEFT heart border. Upper Abdomen: Imaged portions of liver, spleen, pancreas and adrenal glands are unchanged. No acute upper abdominal process Musculoskeletal: No acute musculoskeletal finding. No destructive bone process. Spinal degenerative changes. IMPRESSION: 1. No evidence of recurrent or metastatic disease. Tiny approximately 3 mm nodule in the medial RIGHT chest on image 45 of series 4 is unchanged. 2. Signs of pulmonary emphysema both paraseptal and centrilobular and worse at the lung apices. 3. Emphysema and aortic atherosclerosis. Aortic Atherosclerosis (ICD10-I70.0) and Emphysema (ICD10-J43.9). Electronically Signed   By: Zetta Bills M.D.   On: 06/21/2020 15:34     ASSESSMENT:  1. 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 -Brain MRI on 12/07/2019 with no metastatic disease. -Adjuvant chemotherapy was not offered based on poor performance status and weight loss. -CT chest on 01/08/2020 did not show any evidence of local recurrence left lung.  Trace loculated medial left pleural effusion.  No evidence of metastatic disease in the chest.  Tiny medial right upper lobe 3 mm solid pulmonary nodule decreased in the interval, presumably benign. -CT chest with contrast on 06/21/2020 with no evidence of recurrence or metastatic disease.  3 mm nodule in the medial right chest is unchanged.  2. Normocytic  anemia: -Feraheme on 03/25/2020 and 04/01/2020.   PLAN:  1. Stage IIb (T1BN1) squamous cell carcinoma of the left upper lobe of the lung: -Reviewed CT scan of the chest results from 06/21/2020 with no evidence of recurrence or metastatic disease. -Recommend repeat CT scan in 6 months. -Recommend continue follow-up with Dr. Roxan Hockey.  2. Decreased appetite: -Her appetite has improved.  She gained about 17 pounds. -I have told her to cut back on ice cream.  3. Normocytic anemia: -Received Feraheme on 03/25/2020 and 04/01/2020. -Reviewed labs from 06/21/2020.  Ferritin decreased to 9.  Hemoglobin improved 11.5.  Recommended another set of Feraheme infusions.  However patient felt tired 1 week after each Feraheme infusion and is not interested in taking them. -Recommend starting iron tablet daily even though it did not help in the past. -Recommend discontinue aspirin 325 mg daily as she did not have any history of CAD or stroke. -Follow-up visit in 3 months with a repeat anemia labs.   Orders placed this encounter:  No orders of the defined types were placed in this encounter.    Derek Jack, MD [place] [phone]   I, Milinda Antis, am acting as a scribe for Dr. Sanda Linger.  I, Derek Jack MD, have reviewed the above documentation for accuracy and completeness, and I agree with the  above.

## 2020-08-02 IMAGING — CT NM PET TUM IMG INITIAL (PI) SKULL BASE T - THIGH
4 series · 25 of 25 positions shown · non-contrast
Comparison: CT scan 07/08/2019

CLINICAL DATA: Initial treatment strategy for lung nodule.

EXAM:
NUCLEAR MEDICINE PET SKULL BASE TO THIGH
TECHNIQUE: 8.1 mCi F-18 FDG was injected intravenously. Full-ring PET imaging
was performed from the skull base to thigh after the radiotracer. CT
data was obtained and used for attenuation correction and anatomic
localization.
Fasting blood glucose: 88 mg/dl

[Series 3: ct wb fusion · axial · 5.0mm · 0.98mm/px · z∈[-1526,-739]mm · 8 of 316 slices shown]
[im 1/316]
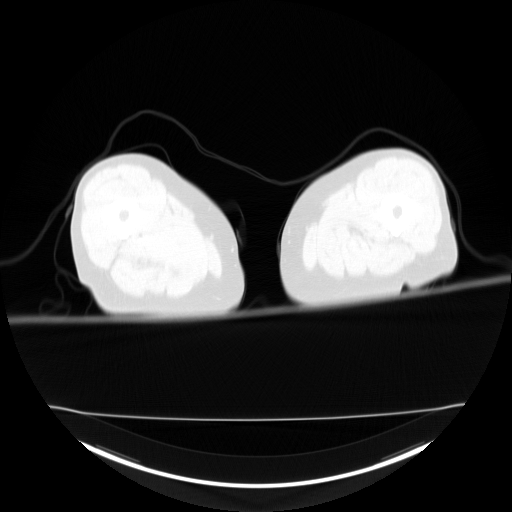
[im 46/316]
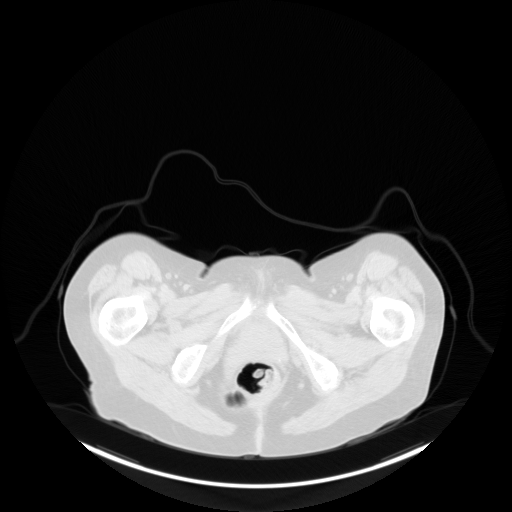
[im 91/316]
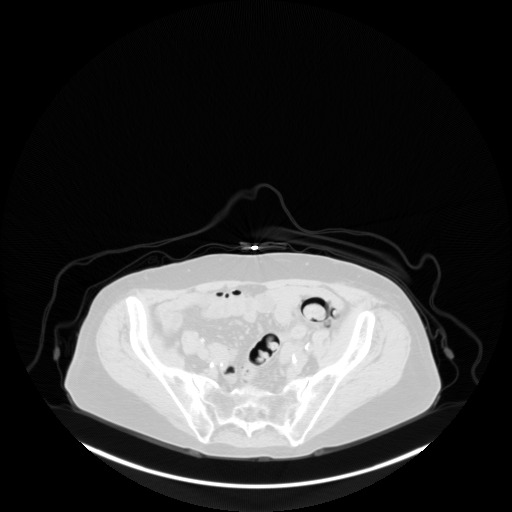
[im 136/316]
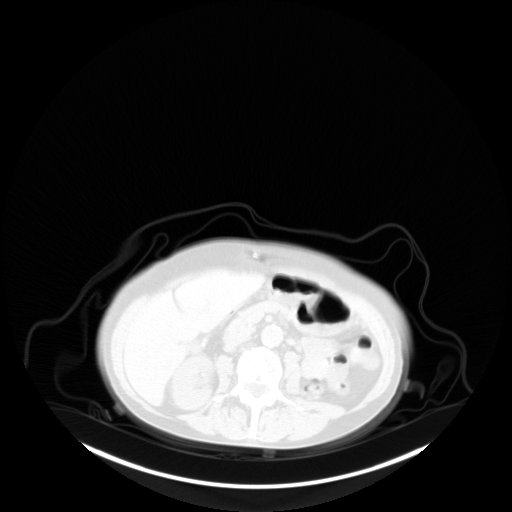
[im 181/316]
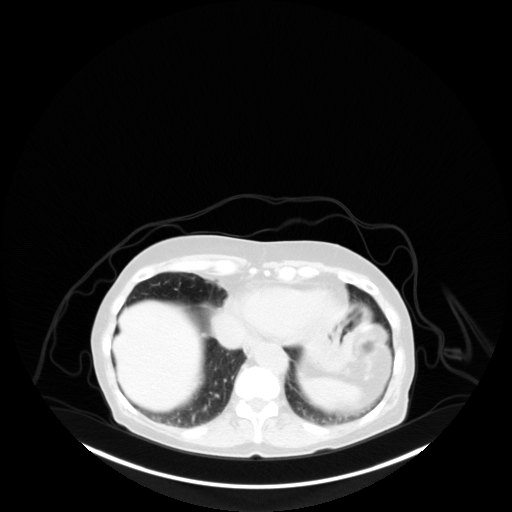
[im 226/316]
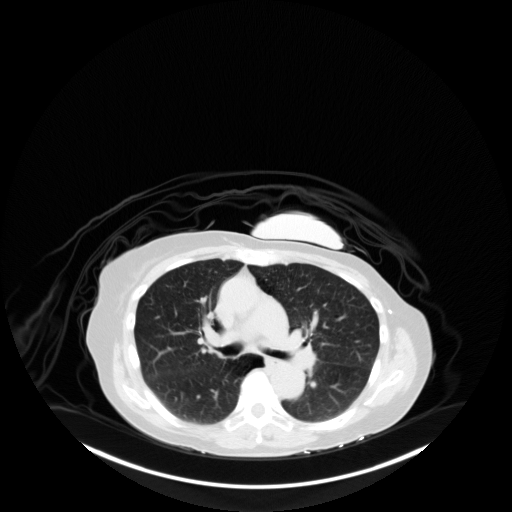
[im 271/316]
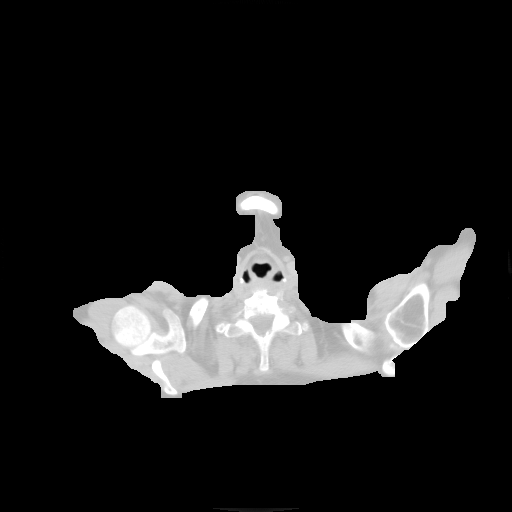
[im 316/316  brain]
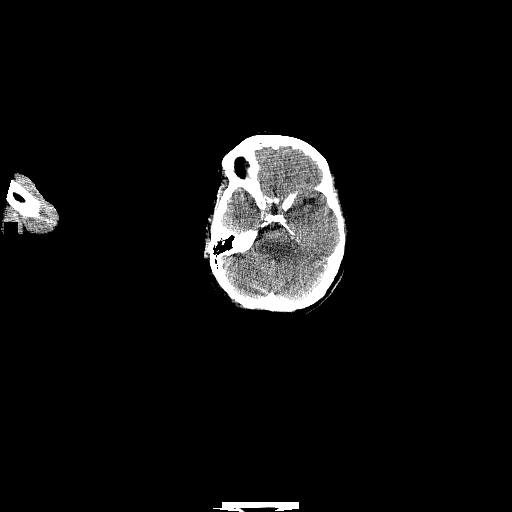

[Series 4: pet wb · axial · 5.0mm · 4.11mm/px · z∈[-1526,-739]mm · 8 of 316 slices shown]
[im 1/316]
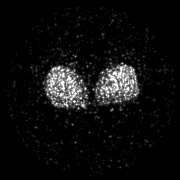
[im 46/316]
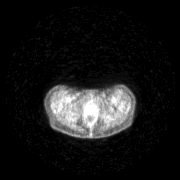
[im 91/316]
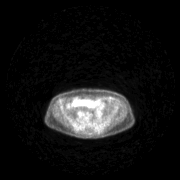
[im 136/316]
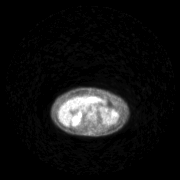
[im 181/316]
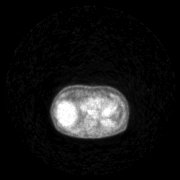
[im 226/316]
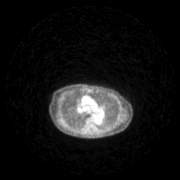
[im 271/316]
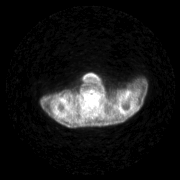
[im 316/316]
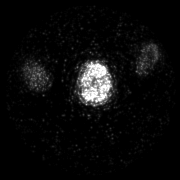

[Series 5: pet wb uncorrected · axial · 5.0mm · 4.11mm/px · z∈[-1526,-739]mm · 8 of 316 slices shown]
[im 1/316]
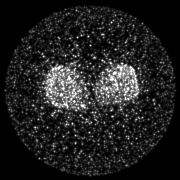
[im 46/316]
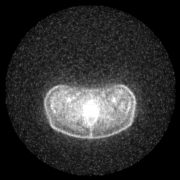
[im 91/316]
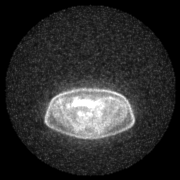
[im 136/316]
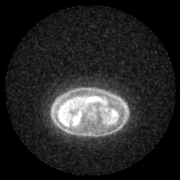
[im 181/316]
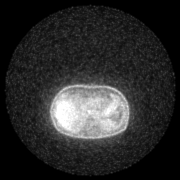
[im 226/316]
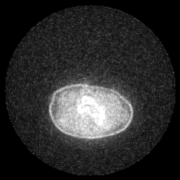
[im 271/316]
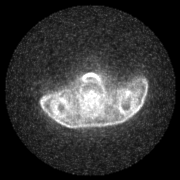
[im 316/316]
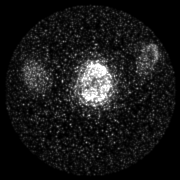

[results mm oncology reading · 5.0mm · 0.63mm/px · 1 of 7 slices shown]
[im 1/7]
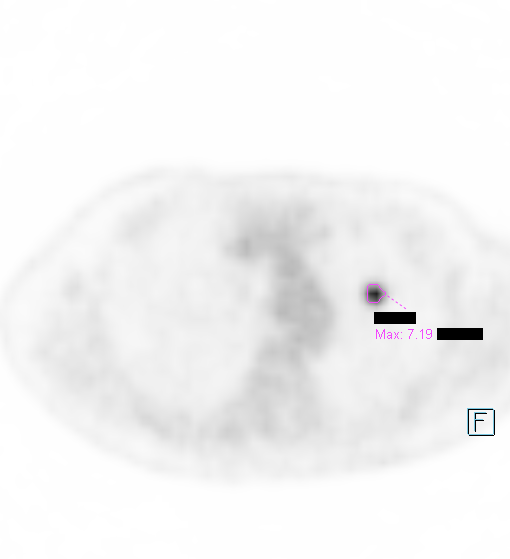

[25 of 25 positions shown; findings below may reference images not displayed]

FINDINGS: Mediastinal blood pool activity: SUV max

Liver activity: SUV max NA

NECK: No significant abnormal hypermetabolic activity in this
region.

Incidental CT findings: Bilateral common carotid atherosclerotic
calcification.

CHEST: [DATE] by 1.0 cm left upper lobe pulmonary nodule on image 73/3
has a maximum SUV of 7.2, compatible with malignancy.

A left upper hilar lymph node measuring 1.3 cm in short axis on
image 83/3 has maximum SUV of 13.4, compatible with malignancy.

Incidental CT findings: Coronary, aortic arch, and branch vessel
atherosclerotic vascular disease. Paraseptal emphysema. Mild
biapical pleuroparenchymal scarring. Left mastectomy with left
axillary dissection.

ABDOMEN/PELVIS: No significant abnormal hypermetabolic activity in
this region.

Incidental CT findings: Chronic low-density nodularity of the left
adrenal gland. Left nephrectomy. Aortoiliac atherosclerotic vascular
disease. Cholelithiasis noted with a 2.0 cm gallstone on image
193/3. Mild sigmoid colon diverticulosis.

SKELETON: Faint activity posteromedially along the right ninth rib
without appreciable CT abnormality, maximum SUV is 3.7 on the right
compared to contralateral side 1.5.

Incidental CT findings: Spondylosis and degenerative disc disease in
the lumbar spine most striking at L5-S1.
IMPRESSION: 1. Hypermetabolic 1.2 cm left upper lobe pulmonary nodule, maximum
SUV 7.2. Hypermetabolic left upper hilar lymph node, maximum SUV
13.4.
2. Mildly asymmetric accentuated activity in the right ninth rib
posteromedially, but without appreciable CT abnormality. Given the
mild degree of accentuation this may well be benign, but
surveillance imaging is suggested.
3. Other imaging findings of potential clinical significance: Aortic
Atherosclerosis (IQEV8-2WJ.J) and Emphysema (IQEV8-7CW.S). Coronary
atherosclerosis. Prior left mastectomy. Cholelithiasis. Chronic
low-density prominence of the left adrenal gland with left
nephrectomy. Mild sigmoid colon diverticulosis.

## 2020-08-15 ENCOUNTER — Other Ambulatory Visit: Payer: Self-pay | Admitting: Thoracic Surgery (Cardiothoracic Vascular Surgery)

## 2020-08-15 DIAGNOSIS — C3490 Malignant neoplasm of unspecified part of unspecified bronchus or lung: Secondary | ICD-10-CM

## 2020-08-16 ENCOUNTER — Encounter: Payer: Self-pay | Admitting: Thoracic Surgery (Cardiothoracic Vascular Surgery)

## 2020-08-16 ENCOUNTER — Ambulatory Visit: Payer: Medicare PPO | Admitting: Thoracic Surgery (Cardiothoracic Vascular Surgery)

## 2020-08-16 ENCOUNTER — Ambulatory Visit
Admission: RE | Admit: 2020-08-16 | Discharge: 2020-08-16 | Disposition: A | Discharge status: Home / Self Care | Payer: Medicare PPO | Source: Ambulatory Visit | Attending: Thoracic Surgery (Cardiothoracic Vascular Surgery) | Admitting: Thoracic Surgery (Cardiothoracic Vascular Surgery)

## 2020-08-16 ENCOUNTER — Other Ambulatory Visit: Payer: Self-pay

## 2020-08-16 VITALS — BP 163/89 | HR 81 | Temp 98.1°F | Resp 20 | Wt 147.0 lb

## 2020-08-16 DIAGNOSIS — C3490 Malignant neoplasm of unspecified part of unspecified bronchus or lung: Secondary | ICD-10-CM

## 2020-08-16 NOTE — Progress Notes (Signed)
[address]       [place] [address]             [phone]     HPI: [name] returns for a scheduled follow-up visit  [name] is a 79 year old woman with a history of tobacco abuse, COPD, stage IIb squamous cell carcinoma status post left upper lobectomy, renal cell carcinoma status post nephrectomy, hypothyroidism, and hypertriglyceridemia.  She had a left upper lobectomy in January 2021.  We initially started with a robotic approach but had to convert to thoracotomy due to a lymph node invading the main pulmonary artery.  She had an extremely difficult early postoperative course with failure to thrive and dehydration.  Her appetite remained poor for a long time although it has improved some recently.  She continues to have pain related to the incision.  She takes Tylenol for that occasionally.  She saw Dr. Delton Coombes in October.  Her scan at that time showed no evidence of recurrent disease.  Past Medical History:  Diagnosis Date   Adenocarcinoma of left breast 04/16/2012   Stage II (T2 N0 M0) left-sided adenocarcinoma breast diagnosed in 1990 by Dr. Romona Curls and treated by Dr. Juanita Craver on NSAB PPD-19 with CMF followed by 5 years of tamoxifen and she remains disease free thus far.    Breast cancer ([place])    left breast/mastectomy/dx approx1990   Gall bladder stones    Grave's disease    High triglycerides    History of kidney stones    small stone  rt    Nerve compression    pinched in neck   Pneumonia 1990   PONV (postoperative nausea and vomiting)     Current Outpatient Medications  Medication Sig Dispense Refill   acetaminophen (TYLENOL) 500 MG tablet Take 500 mg by mouth every 6 (six) hours as needed for mild pain, moderate pain or headache.      Calcium-Vitamin D ([employer] CALCIUM-600/VIT D PO) Take 1 capsule by mouth daily.     ferrous sulfate 325 (65 FE) MG EC tablet Take 325 mg by mouth 3 (three) times daily with meals.      hydroxypropyl methylcellulose / hypromellose (ISOPTO TEARS / GONIOVISC) 2.5 % ophthalmic solution Place 1 drop into both eyes 3 (three) times daily as needed for dry eyes.      levothyroxine (SYNTHROID, LEVOTHROID) 100 MCG tablet Take 100 mcg by mouth daily before breakfast.      meclizine (ANTIVERT) 25 MG tablet      Multiple Vitamins-Minerals (CENTRUM SILVER ULTRA WOMENS) TABS Take 1 tablet by mouth daily.     simvastatin (ZOCOR) 40 MG tablet Take 40 mg by mouth daily at 6 PM.      No current facility-administered medications for this visit.    Physical Exam BP (!) 163/89 (BP Location: Right Arm, Patient Position: Sitting, Cuff Size: Normal)    Pulse 81    Temp 98.1 F (36.7 C) (Skin)    Resp 20    Wt 147 lb (66.7 kg)    SpO2 95% Comment: RA   BMI 24.68 kg/m  79 year old woman in no acute distress Alert and oriented x3 with no focal motor deficit Lungs diminished at left base Incisions well-healed Cardiac regular rate and rhythm  Diagnostic Tests: CHEST - 2 VIEW  COMPARISON:  01/05/2020, CT 06/20/2020  FINDINGS: Volume loss left thorax with elevated left diaphragm as before, history of left upper lobectomy. Post mastectomy changes and axillary clips  on the left. Stable cardiomediastinal silhouette with aortic atherosclerosis. No pneumothorax.  IMPRESSION: No active cardiopulmonary disease. Postsurgical changes of the left thorax.   Electronically Signed   By: Donavan Foil M.D.   On: 08/16/2020 16:36 I personally reviewed the chest x-ray images and concur with the findings noted above  Impression: [name] is a 79 year old woman with a history of tobacco abuse, COPD, stage IIb squamous cell carcinoma status post left upper lobectomy, renal cell carcinoma status post nephrectomy, hypothyroidism, and hypertriglyceridemia.  Stage 2B squamous cell carcinoma left upper lobe-status post left upper lobectomy almost a year ago.  Was not able to receive adjuvant  therapy due to failure to thrive.  Currently on observation.  Being followed by Dr. Delton Coombes.  Renal cell carcinoma-followed by Dr. Lovena Neighbours.  Postthoracotomy pain-remains problematic.  She takes Tylenol but only occasionally.  She does not want anything stronger.  Plan: Return in 6 months with PA and lateral chest x-ray  [name], MD [facility] [phone]

## 2020-08-18 ENCOUNTER — Other Ambulatory Visit (HOSPITAL_COMMUNITY): Payer: Self-pay | Admitting: Urology

## 2020-08-18 DIAGNOSIS — C642 Malignant neoplasm of left kidney, except renal pelvis: Secondary | ICD-10-CM

## 2020-09-09 ENCOUNTER — Ambulatory Visit (HOSPITAL_COMMUNITY)
Admission: RE | Admit: 2020-09-09 | Discharge: 2020-09-09 | Disposition: A | Discharge status: Home / Self Care | Payer: Medicare PPO | Source: Ambulatory Visit | Attending: Urology | Admitting: Urology

## 2020-09-09 ENCOUNTER — Other Ambulatory Visit: Payer: Self-pay

## 2020-09-09 DIAGNOSIS — C642 Malignant neoplasm of left kidney, except renal pelvis: Secondary | ICD-10-CM | POA: Diagnosis not present

## 2020-09-09 LAB — POCT I-STAT CREATININE: Creatinine, Ser: 1 mg/dL (ref 0.44–1.00)

## 2020-09-09 MED ORDER — IOHEXOL 300 MG/ML  SOLN
100.0000 mL | Freq: Once | INTRAMUSCULAR | Status: AC | PRN
  Administered 2020-09-09: 100 mL via INTRAVENOUS

## 2020-09-26 IMAGING — DX DG CHEST 1V PORT
1 series · 1 of 1 positions shown · non-contrast
Comparison: September 19, 2019.

CLINICAL DATA: Chest tube removal.

EXAM:
PORTABLE CHEST 1 VIEW

[chest]
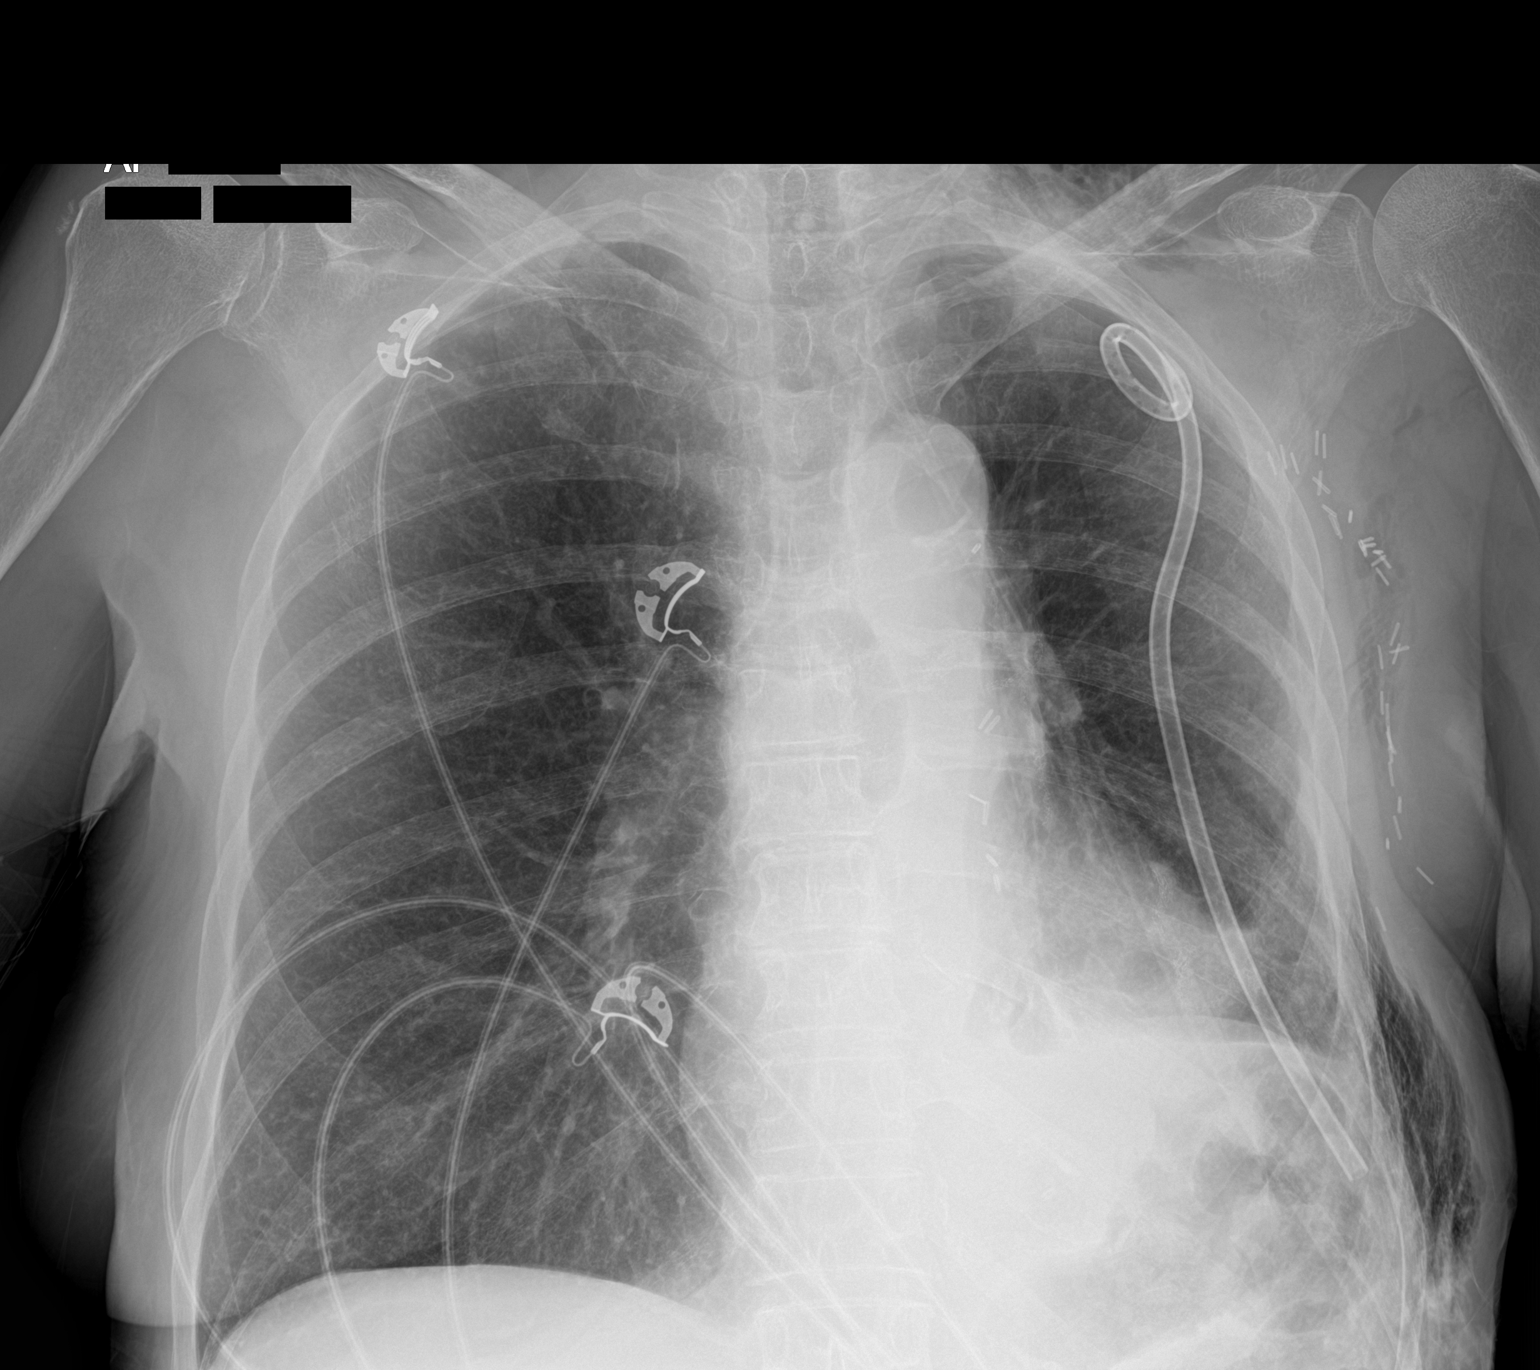

[1 of 1 positions shown; findings below may reference images not displayed]

FINDINGS: Stable cardiomediastinal silhouette. Stable position of left-sided
chest tube is noted. Small left apical pneumothorax is noted which
is decreased compared to prior exam. Stable subcutaneous emphysema
seen in the left supraclavicular region as well as over the left
lateral chest wall. Right lung is clear. Bony thorax is
unremarkable. Elevated left hemidiaphragm is noted with mild left
basilar atelectasis.
IMPRESSION: Stable position of left-sided chest tube. Small left apical
pneumothorax is noted which is decreased compared to prior exam.
Stable subcutaneous emphysema seen over left lateral chest wall and
left supraclavicular region.

## 2020-09-29 ENCOUNTER — Inpatient Hospital Stay (HOSPITAL_COMMUNITY): Payer: Medicare PPO | Attending: Hematology

## 2020-09-29 ENCOUNTER — Other Ambulatory Visit: Payer: Self-pay

## 2020-09-29 DIAGNOSIS — D509 Iron deficiency anemia, unspecified: Secondary | ICD-10-CM | POA: Diagnosis not present

## 2020-09-29 DIAGNOSIS — C3412 Malignant neoplasm of upper lobe, left bronchus or lung: Secondary | ICD-10-CM | POA: Insufficient documentation

## 2020-09-29 DIAGNOSIS — C3492 Malignant neoplasm of unspecified part of left bronchus or lung: Secondary | ICD-10-CM

## 2020-09-29 LAB — IRON AND TIBC
Iron: 84 ug/dL (ref 28–170)
Saturation Ratios: 22 % (ref 10.4–31.8)
TIBC: 390 ug/dL (ref 250–450)
UIBC: 306 ug/dL

## 2020-09-29 LAB — CBC WITH DIFFERENTIAL/PLATELET
Abs Immature Granulocytes: 0.04 10*3/uL (ref 0.00–0.07)
Basophils Absolute: 0.1 10*3/uL (ref 0.0–0.1)
Basophils Relative: 1 %
Eosinophils Absolute: 0.3 10*3/uL (ref 0.0–0.5)
Eosinophils Relative: 2 %
HCT: 45.5 % (ref 36.0–46.0)
Hemoglobin: 14.3 g/dL (ref 12.0–15.0)
Immature Granulocytes: 0 %
Lymphocytes Relative: 12 %
Lymphs Abs: 1.4 10*3/uL (ref 0.7–4.0)
MCH: 30.2 pg (ref 26.0–34.0)
MCHC: 31.4 g/dL (ref 30.0–36.0)
MCV: 96 fL (ref 80.0–100.0)
Monocytes Absolute: 0.7 10*3/uL (ref 0.1–1.0)
Monocytes Relative: 6 %
Neutro Abs: 9.7 10*3/uL — ABNORMAL HIGH (ref 1.7–7.7)
Neutrophils Relative %: 79 %
Platelets: 245 10*3/uL (ref 150–400)
RBC: 4.74 MIL/uL (ref 3.87–5.11)
RDW: 15.5 % (ref 11.5–15.5)
WBC: 12.2 10*3/uL — ABNORMAL HIGH (ref 4.0–10.5)
nRBC: 0 % (ref 0.0–0.2)

## 2020-09-29 LAB — FERRITIN: Ferritin: 26 ng/mL (ref 11–307)

## 2020-10-03 ENCOUNTER — Emergency Department (HOSPITAL_COMMUNITY): Payer: Medicare PPO

## 2020-10-03 ENCOUNTER — Observation Stay (HOSPITAL_COMMUNITY)
Admission: EM | Admit: 2020-10-03 | Discharge: 2020-10-05 | Disposition: A | Discharge status: Home / Self Care | Payer: Medicare PPO | Attending: Internal Medicine | Admitting: Internal Medicine

## 2020-10-03 ENCOUNTER — Other Ambulatory Visit: Payer: Self-pay

## 2020-10-03 ENCOUNTER — Encounter (HOSPITAL_COMMUNITY): Payer: Self-pay

## 2020-10-03 DIAGNOSIS — Z853 Personal history of malignant neoplasm of breast: Secondary | ICD-10-CM | POA: Diagnosis not present

## 2020-10-03 DIAGNOSIS — D509 Iron deficiency anemia, unspecified: Secondary | ICD-10-CM | POA: Diagnosis not present

## 2020-10-03 DIAGNOSIS — Z87891 Personal history of nicotine dependence: Secondary | ICD-10-CM | POA: Insufficient documentation

## 2020-10-03 DIAGNOSIS — E039 Hypothyroidism, unspecified: Secondary | ICD-10-CM | POA: Diagnosis present

## 2020-10-03 DIAGNOSIS — M6281 Muscle weakness (generalized): Secondary | ICD-10-CM | POA: Insufficient documentation

## 2020-10-03 DIAGNOSIS — N178 Other acute kidney failure: Secondary | ICD-10-CM | POA: Insufficient documentation

## 2020-10-03 DIAGNOSIS — C3490 Malignant neoplasm of unspecified part of unspecified bronchus or lung: Secondary | ICD-10-CM | POA: Diagnosis present

## 2020-10-03 DIAGNOSIS — N390 Urinary tract infection, site not specified: Secondary | ICD-10-CM | POA: Diagnosis present

## 2020-10-03 DIAGNOSIS — Z85118 Personal history of other malignant neoplasm of bronchus and lung: Secondary | ICD-10-CM | POA: Insufficient documentation

## 2020-10-03 DIAGNOSIS — A4151 Sepsis due to Escherichia coli [E. coli]: Principal | ICD-10-CM | POA: Insufficient documentation

## 2020-10-03 DIAGNOSIS — Z20822 Contact with and (suspected) exposure to covid-19: Secondary | ICD-10-CM | POA: Insufficient documentation

## 2020-10-03 DIAGNOSIS — C50919 Malignant neoplasm of unspecified site of unspecified female breast: Secondary | ICD-10-CM | POA: Diagnosis present

## 2020-10-03 DIAGNOSIS — B9620 Unspecified Escherichia coli [E. coli] as the cause of diseases classified elsewhere: Secondary | ICD-10-CM | POA: Diagnosis present

## 2020-10-03 DIAGNOSIS — N179 Acute kidney failure, unspecified: Secondary | ICD-10-CM | POA: Diagnosis present

## 2020-10-03 DIAGNOSIS — N12 Tubulo-interstitial nephritis, not specified as acute or chronic: Secondary | ICD-10-CM | POA: Diagnosis present

## 2020-10-03 DIAGNOSIS — R109 Unspecified abdominal pain: Secondary | ICD-10-CM | POA: Diagnosis present

## 2020-10-03 DIAGNOSIS — N3001 Acute cystitis with hematuria: Secondary | ICD-10-CM

## 2020-10-03 DIAGNOSIS — C642 Malignant neoplasm of left kidney, except renal pelvis: Secondary | ICD-10-CM | POA: Diagnosis present

## 2020-10-03 LAB — CBC
HCT: 42.3 % (ref 36.0–46.0)
Hemoglobin: 13.6 g/dL (ref 12.0–15.0)
MCH: 29.8 pg (ref 26.0–34.0)
MCHC: 32.2 g/dL (ref 30.0–36.0)
MCV: 92.6 fL (ref 80.0–100.0)
Platelets: 240 10*3/uL (ref 150–400)
RBC: 4.57 MIL/uL (ref 3.87–5.11)
RDW: 15.2 % (ref 11.5–15.5)
WBC: 6.6 10*3/uL (ref 4.0–10.5)
nRBC: 0 % (ref 0.0–0.2)

## 2020-10-03 LAB — COMPREHENSIVE METABOLIC PANEL
ALT: 19 U/L (ref 0–44)
AST: 21 U/L (ref 15–41)
Albumin: 4.1 g/dL (ref 3.5–5.0)
Alkaline Phosphatase: 77 U/L (ref 38–126)
Anion gap: 14 (ref 5–15)
BUN: 40 mg/dL — ABNORMAL HIGH (ref 8–23)
CO2: 23 mmol/L (ref 22–32)
Calcium: 9.3 mg/dL (ref 8.9–10.3)
Chloride: 103 mmol/L (ref 98–111)
Creatinine, Ser: 1.37 mg/dL — ABNORMAL HIGH (ref 0.44–1.00)
GFR, Estimated: 40 mL/min — ABNORMAL LOW (ref >60)
Glucose, Bld: 109 mg/dL — ABNORMAL HIGH (ref 70–99)
Potassium: 3.8 mmol/L (ref 3.5–5.1)
Sodium: 140 mmol/L (ref 135–145)
Total Bilirubin: 1.2 mg/dL (ref 0.3–1.2)
Total Protein: 7.9 g/dL (ref 6.5–8.1)

## 2020-10-03 LAB — LIPASE, BLOOD: Lipase: 30 U/L (ref 11–51)

## 2020-10-03 LAB — LACTIC ACID, PLASMA: Lactic Acid, Venous: 0.8 mmol/L (ref 0.5–1.9)

## 2020-10-03 MED ORDER — SODIUM CHLORIDE 0.9 % IV BOLUS
1000.0000 mL | Freq: Once | INTRAVENOUS | Status: AC
  Administered 2020-10-03: 1000 mL via INTRAVENOUS

## 2020-10-03 MED ORDER — SODIUM CHLORIDE 0.9 % IV SOLN
1.0000 g | Freq: Once | INTRAVENOUS | Status: AC
  Administered 2020-10-03: 1 g via INTRAVENOUS
  Filled 2020-10-03: qty 10

## 2020-10-03 MED ORDER — ONDANSETRON HCL 4 MG/2ML IJ SOLN
4.0000 mg | Freq: Once | INTRAMUSCULAR | Status: AC
  Administered 2020-10-03: 4 mg via INTRAVENOUS
  Filled 2020-10-03: qty 2

## 2020-10-03 MED ORDER — ACETAMINOPHEN 500 MG PO TABS
1000.0000 mg | ORAL_TABLET | Freq: Once | ORAL | Status: AC
  Administered 2020-10-03: 1000 mg via ORAL
  Filled 2020-10-03: qty 2

## 2020-10-03 MED ORDER — MORPHINE SULFATE (PF) 4 MG/ML IV SOLN
4.0000 mg | Freq: Once | INTRAVENOUS | Status: AC
  Administered 2020-10-03: 4 mg via INTRAVENOUS
  Filled 2020-10-03: qty 1

## 2020-10-03 NOTE — ED Triage Notes (Signed)
Pt presents with c/o abdominal pain on the right upper side. Pt was told to come here by the PA at her urologist to come here. Pt only has one kidney, hx of kidney cancer, has some hematuria at this time.

## 2020-10-03 NOTE — ED Provider Notes (Signed)
[facility] DEPT Provider Note   CSN: [id number] Arrival date & time: 10/03/20  1154     History Chief Complaint  Patient presents with  . Abdominal Pain    [name] is a 80 y.o. female past medical history of renal carcinoma with left-sided nephrectomy who presents for evaluation of hematuria, right-sided abdominal pain that has been for last few days.  Daughter states that she noticed some increased urinary frequency, hematuria.  Given her history of nephrectomy, they followed up with the urologist office this morning.  At the urologist office, she was told she had a UTI based on her urine and was given a dose of antibiotics but was sent to the ED for further evaluation of her's pain.  Patient states the pain is like a cramping pain and is there constantly.  She denies any alleviating or exacerbating factors.  She has been able to tolerate some food but states that that does not have any impact on her pain.  She has not noted any fevers, nausea/vomiting.  She denies any chest pain, cough that is new, difficulty breathing.  The history is provided by the patient.       Past Medical History:  Diagnosis Date  . Adenocarcinoma of left breast 04/16/2012   Stage II (T2 N0 M0) left-sided adenocarcinoma breast diagnosed in 1990 by Dr. Romona Curls and treated by Dr. Juanita Craver on NSAB PPD-19 with CMF followed by 5 years of tamoxifen and she remains disease free thus far.   . Breast cancer ([place])    left breast/mastectomy/dx approx1990  . Gall bladder stones   . Grave's disease   . High triglycerides   . History of kidney stones    small stone  rt   . Nerve compression    pinched in neck  . Pneumonia 1990  . PONV (postoperative nausea and vomiting)     Patient Active Problem List   Diagnosis Date Noted  . Pyelonephritis 10/03/2020  . Iron deficiency anemia 12/11/2019  . Dehydration 09/29/2019  . Acute dehydration 09/29/2019  . Lung cancer ([place])  09/11/2019  . Left upper lobe pulmonary nodule 09/11/2019  . Nodule of left lung 07/29/2019  . Clear cell renal cell carcinoma, left ([place]) 04/13/2019  . Renal mass 03/25/2019  . Left kidney mass 02/16/2019  . Adenocarcinoma of left breast 04/16/2012    Past Surgical History:  Procedure Laterality Date  . CARPAL TUNNEL RELEASE     rt. hand  . cataract surgery     bilateral  . COLONOSCOPY    . EYE SURGERY    . LAPAROSCOPIC NEPHRECTOMY Left 03/25/2019   Procedure: LAPAROSCOPIC RADICAL NEPHRECTOMY;  Surgeon: Ceasar Mons, MD;  Location: [facility];  Service: Urology;  Laterality: Left;  Marland Kitchen MASTECTOMY     left.  Marland Kitchen NODE DISSECTION Left 09/11/2019   Procedure: Node Dissection;  Surgeon: [name], MD;  Location: [facility];  Service: Thoracic;  Laterality: Left;  . THORACOTOMY/LOBECTOMY Left 09/11/2019   Procedure: Thoracotomy/Lobectomy Left Upper Lobe;  Surgeon: [name], MD;  Location: [facility];  Service: Thoracic;  Laterality: Left;  . TONSILLECTOMY       OB History   No obstetric history on file.     Family History  Problem Relation Age of Onset  . Cancer Father        lung cancer  . Cancer Brother        colon cancer  . Cancer Other  lung cancer/died age 85    Social History   Tobacco Use  . Smoking status: Former Smoker    Packs/day: 1.00    Years: 60.00    Pack years: 60.00    Types: Cigarettes    Quit date: 09/22/2019    Years since quitting: 1.0  . Smokeless tobacco: Never Used  . Tobacco comment: attempted to stop but its expensive to quit  Vaping Use  . Vaping Use: Never used  Substance Use Topics  . Alcohol use: No  . Drug use: No    Home Medications Prior to Admission medications   Medication Sig Start Date End Date Taking? Authorizing Provider  acetaminophen (TYLENOL) 500 MG tablet Take 500 mg by mouth every 6 (six) hours as needed for mild pain, moderate pain or headache.    Yes [provider]   Calcium-Vitamin D ([employer] CALCIUM-600/VIT D PO) Take 1 capsule by mouth daily.   Yes [provider]  ferrous sulfate 325 (65 FE) MG EC tablet Take 325 mg by mouth daily.   Yes [provider]  hydroxypropyl methylcellulose / hypromellose (ISOPTO TEARS / GONIOVISC) 2.5 % ophthalmic solution Place 1 drop into both eyes 3 (three) times daily as needed for dry eyes.    Yes [provider]  levothyroxine (SYNTHROID, LEVOTHROID) 100 MCG tablet Take 100 mcg by mouth daily before breakfast.  04/04/18  Yes [provider]  meclizine (ANTIVERT) 25 MG tablet Take 25 mg by mouth daily as needed for dizziness or nausea. 05/24/20  Yes [provider]  Multiple Vitamins-Minerals (CENTRUM SILVER ULTRA WOMENS) TABS Take 1 tablet by mouth daily.   Yes [provider]  simvastatin (ZOCOR) 40 MG tablet Take 40 mg by mouth daily at 6 PM.  02/18/20  Yes [provider]  VASCEPA 1 g capsule Take 2 g by mouth 2 (two) times daily. 08/30/20  Yes [provider]    Allergies    Chantix [varenicline]  Review of Systems   Review of Systems  Constitutional: Negative for fever.  Respiratory: Negative for cough and shortness of breath.   Cardiovascular: Negative for chest pain.  Gastrointestinal: Positive for abdominal pain. Negative for nausea and vomiting.  Genitourinary: Positive for flank pain, frequency and hematuria. Negative for dysuria.  Neurological: Negative for headaches.  All other systems reviewed and are negative.   Physical Exam Updated Vital Signs BP 108/64   Pulse 82   Temp (!) 101.6 F (38.7 C) (Rectal)   Resp 20   SpO2 90%   Physical Exam Vitals and nursing note reviewed.  Constitutional:      Appearance: Normal appearance. She is well-developed and well-nourished.     Comments: Appears uncomfortable   HENT:     Head: Normocephalic and atraumatic.     Mouth/Throat:     Mouth: Oropharynx is clear and moist and mucous  membranes are normal.  Eyes:     General: Lids are normal.     Extraocular Movements: EOM normal.     Conjunctiva/sclera: Conjunctivae normal.     Pupils: Pupils are equal, round, and reactive to light.  Cardiovascular:     Rate and Rhythm: Normal rate and regular rhythm.     Pulses: Normal pulses.     Heart sounds: Normal heart sounds. No murmur heard. No friction rub. No gallop.   Pulmonary:     Effort: Pulmonary effort is normal.     Breath sounds: Normal breath sounds.  Abdominal:  Palpations: Abdomen is soft. Abdomen is not rigid.     Tenderness: There is abdominal tenderness. There is right CVA tenderness. There is no guarding.       Comments: Tenderness noted diffusely to the right abdomen that extends diffusely to the right lower quadrant.  No focal tenderness on McBurney's point.  Right-sided CVA tenderness noted.  No rigidity, guarding.  Musculoskeletal:        General: Normal range of motion.     Cervical back: Full passive range of motion without pain.  Skin:    General: Skin is warm and dry.     Capillary Refill: Capillary refill takes less than 2 seconds.  Neurological:     Mental Status: She is alert and oriented to person, place, and time.  Psychiatric:        Mood and Affect: Mood and affect normal.        Speech: Speech normal.     ED Results / Procedures / Treatments   Labs (all labs ordered are listed, but only abnormal results are displayed) Labs Reviewed  COMPREHENSIVE METABOLIC PANEL - Abnormal; Notable for the following components:      Result Value   Glucose, Bld 109 (*)    BUN 40 (*)    Creatinine, Ser 1.37 (*)    GFR, Estimated 40 (*)    All other components within normal limits  SARS CORONAVIRUS 2 BY RT PCR (HOSPITAL ORDER, [facility])  CULTURE, BLOOD (ROUTINE X 2)  CULTURE, BLOOD (ROUTINE X 2)  LIPASE, BLOOD  CBC  URINALYSIS, ROUTINE W REFLEX MICROSCOPIC  LACTIC ACID, PLASMA  LACTIC ACID, PLASMA     EKG None  Radiology CT ABDOMEN PELVIS WO CONTRAST  Result Date: 10/03/2020 CLINICAL DATA:  Abdominal pain and hematuria. History of kidney cancer with nephrectomy. Right-sided abdominal pain. EXAM: CT ABDOMEN AND PELVIS WITHOUT CONTRAST TECHNIQUE: Multidetector CT imaging of the abdomen and pelvis was performed following the standard protocol without IV contrast. COMPARISON:  September 09, 2020 FINDINGS: Lower chest: The lung bases are clear. The heart size is normal. Hepatobiliary: There is decreased hepatic attenuation suggestive of hepatic steatosis. Cholelithiasis without acute inflammation.There is no biliary ductal dilation. Pancreas: Normal contours without ductal dilatation. No peripancreatic fluid collection. Spleen: Unremarkable. Adrenals/Urinary Tract: --Adrenal glands: There is persistent left adrenal thickening which is essentially stable from prior study. --Right kidney/ureter: There is mild right-sided collecting system dilatation without evidence for an obstructing stone. There is wall thickening of the right ureter. --Left kidney/ureter: The patient is status post prior left-sided nephrectomy. --Urinary bladder: There is mild bladder wall thickening. Stomach/Bowel: --Stomach/Duodenum: No hiatal hernia or other gastric abnormality. Normal duodenal course and caliber. --Small bowel: Unremarkable. --Colon: Unremarkable. --Appendix: Normal. Vascular/Lymphatic: Atherosclerotic calcification is present within the non-aneurysmal abdominal aorta, without hemodynamically significant stenosis. --No retroperitoneal lymphadenopathy. --No mesenteric lymphadenopathy. --No pelvic or inguinal lymphadenopathy. Reproductive: Unremarkable Other: No ascites or free air. There is a fat containing left inguinal hernia. Musculoskeletal. No acute displaced fractures. IMPRESSION: 1. Mild right-sided collecting system dilatation without evidence for an obstructing stone. There is wall thickening of the right ureter.  Findings may be secondary to a recently passed stone or ascending urinary tract infection. 2. Mild bladder wall thickening. Correlate with urinalysis to exclude cystitis. 3. Hepatic steatosis. 4. Cholelithiasis without acute inflammation. 5. Fat containing left inguinal hernia. Aortic Atherosclerosis (ICD10-I70.0). Electronically Signed   By: Constance Holster M.D.   On: 10/03/2020 21:48   DG Chest Portable 1  View  Result Date: 10/03/2020 CLINICAL DATA:  Cough EXAM: PORTABLE CHEST 1 VIEW COMPARISON:  08/16/2020 FINDINGS: Cardiac shadow is within normal limits. Aortic calcifications are noted. The lungs are clear. Postsurgical changes in the left axilla are noted. No bony abnormality is noted. IMPRESSION: No active disease. Electronically Signed   By: Inez Catalina M.D.   On: 10/03/2020 23:17    Procedures Procedures   Medications Ordered in ED Medications  cefTRIAXone (ROCEPHIN) 1 g in sodium chloride 0.9 % 100 mL IVPB (1 g Intravenous New Bag/Given 10/03/20 2349)  sodium chloride 0.9 % bolus 1,000 mL (0 mLs Intravenous Stopped 10/03/20 2329)  ondansetron (ZOFRAN) injection 4 mg (4 mg Intravenous Given 10/03/20 2205)  morphine 4 MG/ML injection 4 mg (4 mg Intravenous Given 10/03/20 2205)  acetaminophen (TYLENOL) tablet 1,000 mg (1,000 mg Oral Given 10/03/20 2253)  cefTRIAXone (ROCEPHIN) 1 g in sodium chloride 0.9 % 100 mL IVPB (0 g Intravenous Stopped 10/03/20 2330)  sodium chloride 0.9 % bolus 1,000 mL (1,000 mLs Intravenous New Bag/Given 10/03/20 2349)    ED Course  I have reviewed the triage vital signs and the nursing notes.  Pertinent labs & imaging results that were available during my care of the patient were reviewed by me and considered in my medical decision making (see chart for details).  Clinical Course as of 10/03/20 2352  Mon Oct 03, 2020  2252 I was directly involved in this patients medical care.  [JH]    Clinical Course User Index [JH] Luna Fuse, MD   MDM  Rules/Calculators/A&P                          80 year old female past [place] renal carcinoma that resulted in nephrectomy who presents for evaluation of hematuria, increased urinary frequency, nausea.  She saw [facility] today and was noted to have a UTI and was sent to the ED for further evaluation given her pain and hematuria.  On initial arrival, she was afebrile, slightly tachycardic.  Vitals otherwise stable.  When she came back to the ED, she spiked a fever 1-1.6.  Vitals otherwise stable.  Labs ordered at triage.  Concern for UTI versus pyelonephritis.  CBC shows no leukocytosis or anemia.  Lipase is unremarkable.  CMP shows BUN of 40, creatinine of 1.37.  This does not appear to be a slight bump as she had blood work done 3 weeks ago that showed a creatinine of 1.  3 months ago, she was at 0.89 and she is consistently been under 1.  Chest x-ray is unremarkable.  CT on pelvis shows mild right-sided collecting system dilatation without any evidence of stone.  There is wall thickening in the right ureter which could be a recently passed stone versus an ascending urinary tract infection.  She has mild bladder wall thickening.  Suspect this is related to her urinary tract infection.  At this time, she has not had much input and does not feel like she has to urinate.  We will start her on antibiotics.  Blood cultures ordered given concern for possible sepsis.    Discussed with Dr. Dr. Hal Hope (hospitalist) who accepts patient for admission.  Portions of this note were generated with L[employer]. Dictation errors may occur despite best attempts at proofreading.   Final Clinical Impression(s) / ED Diagnoses Final diagnoses:  None    Rx / DC Orders ED Discharge Orders    None  Volanda Napoleon, PA-C 10/03/20 2352    Luna Fuse, MD 10/05/20 2237

## 2020-10-03 NOTE — ED Notes (Signed)
x1 BC drawn before abx started

## 2020-10-04 ENCOUNTER — Encounter (HOSPITAL_COMMUNITY): Payer: Self-pay | Admitting: Internal Medicine

## 2020-10-04 DIAGNOSIS — E039 Hypothyroidism, unspecified: Secondary | ICD-10-CM | POA: Diagnosis present

## 2020-10-04 DIAGNOSIS — N12 Tubulo-interstitial nephritis, not specified as acute or chronic: Secondary | ICD-10-CM | POA: Diagnosis not present

## 2020-10-04 LAB — CBC
HCT: 35.2 % — ABNORMAL LOW (ref 36.0–46.0)
Hemoglobin: 11 g/dL — ABNORMAL LOW (ref 12.0–15.0)
MCH: 30.2 pg (ref 26.0–34.0)
MCHC: 31.3 g/dL (ref 30.0–36.0)
MCV: 96.7 fL (ref 80.0–100.0)
Platelets: 192 10*3/uL (ref 150–400)
RBC: 3.64 MIL/uL — ABNORMAL LOW (ref 3.87–5.11)
RDW: 15.3 % (ref 11.5–15.5)
WBC: 5 10*3/uL (ref 4.0–10.5)
nRBC: 0 % (ref 0.0–0.2)

## 2020-10-04 LAB — URINALYSIS, ROUTINE W REFLEX MICROSCOPIC
Bilirubin Urine: NEGATIVE
Glucose, UA: NEGATIVE mg/dL
Ketones, ur: NEGATIVE mg/dL
Nitrite: NEGATIVE
Protein, ur: 100 mg/dL — AB
Specific Gravity, Urine: 1.012 (ref 1.005–1.030)
WBC, UA: >50 WBC/hpf — ABNORMAL HIGH (ref 0–5)
pH: 5 (ref 5.0–8.0)

## 2020-10-04 LAB — SARS CORONAVIRUS 2 BY RT PCR (HOSPITAL ORDER, PERFORMED IN ~~LOC~~ HOSPITAL LAB): SARS Coronavirus 2: NEGATIVE

## 2020-10-04 LAB — COMPREHENSIVE METABOLIC PANEL
ALT: 15 U/L (ref 0–44)
AST: 18 U/L (ref 15–41)
Albumin: 3.1 g/dL — ABNORMAL LOW (ref 3.5–5.0)
Alkaline Phosphatase: 61 U/L (ref 38–126)
Anion gap: 9 (ref 5–15)
BUN: 33 mg/dL — ABNORMAL HIGH (ref 8–23)
CO2: 25 mmol/L (ref 22–32)
Calcium: 7.8 mg/dL — ABNORMAL LOW (ref 8.9–10.3)
Chloride: 106 mmol/L (ref 98–111)
Creatinine, Ser: 0.87 mg/dL (ref 0.44–1.00)
GFR, Estimated: >60 mL/min (ref >60)
Glucose, Bld: 126 mg/dL — ABNORMAL HIGH (ref 70–99)
Potassium: 3.8 mmol/L (ref 3.5–5.1)
Sodium: 140 mmol/L (ref 135–145)
Total Bilirubin: 0.4 mg/dL (ref 0.3–1.2)
Total Protein: 6.2 g/dL — ABNORMAL LOW (ref 6.5–8.1)

## 2020-10-04 MED ORDER — SIMVASTATIN 20 MG PO TABS
40.0000 mg | ORAL_TABLET | Freq: Every day | ORAL | Status: DC
Start: 2020-10-04 — End: 2020-10-05
  Administered 2020-10-04: 17:00:00 40 mg via ORAL
  Filled 2020-10-04: qty 2

## 2020-10-04 MED ORDER — LACTATED RINGERS IV SOLN: INTRAVENOUS | Status: AC

## 2020-10-04 MED ORDER — FERROUS SULFATE 325 (65 FE) MG PO TABS
325.0000 mg | ORAL_TABLET | Freq: Every day | ORAL | Status: DC
  Administered 2020-10-04 – 2020-10-05 (×2): 325 mg via ORAL
  Filled 2020-10-04 (×2): qty 1

## 2020-10-04 MED ORDER — LEVOTHYROXINE SODIUM 100 MCG PO TABS
100.0000 ug | ORAL_TABLET | Freq: Every day | ORAL | Status: DC
  Administered 2020-10-04 – 2020-10-05 (×2): 100 ug via ORAL
  Filled 2020-10-04 (×2): qty 1

## 2020-10-04 MED ORDER — MORPHINE SULFATE (PF) 2 MG/ML IV SOLN: 1.0000 mg | INTRAVENOUS | Status: DC | PRN

## 2020-10-04 MED ORDER — ACETAMINOPHEN 325 MG PO TABS
650.0000 mg | ORAL_TABLET | Freq: Four times a day (QID) | ORAL | Status: DC | PRN
  Administered 2020-10-05: 04:00:00 650 mg via ORAL
  Filled 2020-10-04: qty 2

## 2020-10-04 MED ORDER — SODIUM CHLORIDE 0.9 % IV SOLN
2.0000 g | INTRAVENOUS | Status: DC
  Administered 2020-10-04: 2 g via INTRAVENOUS
  Filled 2020-10-04: qty 2

## 2020-10-04 MED ORDER — ICOSAPENT ETHYL 1 G PO CAPS
2.0000 g | ORAL_CAPSULE | Freq: Two times a day (BID) | ORAL | Status: DC
  Administered 2020-10-04 – 2020-10-05 (×3): 2 g via ORAL
  Filled 2020-10-04 (×4): qty 2

## 2020-10-04 MED ORDER — ENOXAPARIN SODIUM 40 MG/0.4ML ~~LOC~~ SOLN
40.0000 mg | SUBCUTANEOUS | Status: DC
  Administered 2020-10-04 – 2020-10-05 (×2): 40 mg via SUBCUTANEOUS
  Filled 2020-10-04 (×2): qty 0.4

## 2020-10-04 MED ORDER — ACETAMINOPHEN 650 MG RE SUPP: 650.0000 mg | Freq: Four times a day (QID) | RECTAL | Status: DC | PRN

## 2020-10-04 NOTE — Evaluation (Signed)
Physical Therapy Evaluation Patient Details Name: [name] MRN: [id number] DOB: Mar 08, 1941 Today's Date: 10/04/2020   History of Present Illness  80 yo female admitted with sepsis. Hx of renal cell ca, lung ca  Clinical Impression  On eval, pt was Min guard assist for mobility. She walked ~15 feet x 2 around the room. She politely declined hallway ambulation during this session. Noted audible wheezing with ambulation. Pt plans to return home where she lives with her daughter (daughter works nights). Will recommend HHPT, if pt is agreeable.     Follow Up Recommendations Home health PT;Supervision - Intermittent    Equipment Recommendations  None recommended by PT    Recommendations for Other Services       Precautions / Restrictions Precautions Precautions: Fall Restrictions Weight Bearing Restrictions: No      Mobility  Bed Mobility Overal bed mobility: Modified Independent                  Transfers Overall transfer level: Needs assistance   Transfers: Sit to/from Stand Sit to Stand: Min guard         General transfer comment: for safety.  Ambulation/Gait Ambulation/Gait assistance: Min guard Gait Distance (Feet): 15 Feet (x2) Assistive device: IV Pole;None Gait Pattern/deviations: Step-through pattern;Decreased stride length     General Gait Details: unsteady. very close gaurding. pt needs to use 1 point of support for stability (used iv pole vs "furniture walk/cruising"). slow gait speed. audible wheezing with ambulation  Stairs            Wheelchair Mobility    Modified Rankin (Stroke Patients Only)       Balance Overall balance assessment: Needs assistance           Standing balance-Leahy Scale: Fair                               Pertinent Vitals/Pain Pain Assessment: No/denies pain    Home Living Family/patient expects to be discharged to:: Private residence Living Arrangements: Children (daughter works  nights) Available Help at Discharge: Family;Available PRN/intermittently Type of Home: House Home Access: Stairs to enter     Home Layout: Two level Home Equipment: Kasandra Knudsen - single point      Prior Function Level of Independence: Needs assistance   Gait / Transfers Assistance Needed: uses cane for ambulation. daughter helps pt up/down stairs.           Hand Dominance        Extremity/Trunk Assessment   Upper Extremity Assessment Upper Extremity Assessment: Defer to OT evaluation    Lower Extremity Assessment Lower Extremity Assessment: Generalized weakness    Cervical / Trunk Assessment Cervical / Trunk Assessment: Normal  Communication   Communication: No difficulties  Cognition Arousal/Alertness: Awake/alert Behavior During Therapy: WFL for tasks assessed/performed Overall Cognitive Status: Within Functional Limits for tasks assessed                                        General Comments      Exercises     Assessment/Plan    PT Assessment Patient needs continued PT services  PT Problem List Decreased strength;Decreased mobility;Decreased balance;Decreased activity tolerance       PT Treatment Interventions DME instruction;Gait training;Therapeutic activities;Therapeutic exercise;Patient/family education;Balance training;Functional mobility training    PT Goals (Current goals can be found  in the Care Plan section)  Acute Rehab PT Goals Patient Stated Goal: none stated PT Goal Formulation: With patient Time For Goal Achievement: 10/18/20 Potential to Achieve Goals: Good    Frequency Min 3X/week   Barriers to discharge        Co-evaluation               AM-PAC PT "6 Clicks" Mobility  Outcome Measure Help needed turning from your back to your side while in a flat bed without using bedrails?: None Help needed moving from lying on your back to sitting on the side of a flat bed without using bedrails?: None Help needed  moving to and from a bed to a chair (including a wheelchair)?: A Little Help needed standing up from a chair using your arms (e.g., wheelchair or bedside chair)?: A Little Help needed to walk in hospital room?: A Little Help needed climbing 3-5 steps with a railing? : A Little 6 Click Score: 20    End of Session   Activity Tolerance: Patient limited by fatigue Patient left: in chair;with call bell/phone within reach;with chair alarm set   PT Visit Diagnosis: Unsteadiness on feet (R26.81);Muscle weakness (generalized) (M62.81)    Time: 1350-1403 PT Time Calculation (min) (ACUTE ONLY): 13 min   Charges:   PT Evaluation $PT Eval Moderate Complexity: 1 Mod             Doreatha Massed, PT Acute Rehabilitation  Office: [phone] Pager: [phone]

## 2020-10-04 NOTE — Progress Notes (Signed)
PROGRESS NOTE  [name]  DOB: 1941/03/19  PCP: [facility] JWL:[id number]  DOA: 10/03/2020  LOS: 0 days   Chief Complaint  Patient presents with  . Abdominal Pain   Brief narrative: [name] is a 80 y.o. female with PMH significant for renal cell cancer status post left-sided nephrectomy, history of lung cancer status post lobectomy followed by oncologist and urologist.   On 1/31, patient was seen at urologist office for right flank pain with dysuria and some hematuria.  Urinalysis done at the office was consistent with UTI and hence patient was sent to ED for inpatient management.  In the ED, patient had a temperature of one 1.6, tachycardia up to 104. Labs with creatinine elevated to 1.37 compared to a baseline of 0.8, WBC count normal, lactic acid level normal. CT abdomen and pelvis showed features concerning for ascending urinary tract infection versus recently passed stone.  Patient was given IV Rocephin.  Given her immunocompromise status from malignancy, single kidney status, AKI and risk of worsening pyelonephritis, patient was admitted for inpatient management.    Subjective: Patient was seen and examined this morning.  Pleasant elderly Caucasian female.  Lying on bed.  Seems slow and confused.  Continues to have pain in the lower abdomen. Chart reviewed. Last fever was one 1.6 last night at 10:00 PM. Labs this morning with creatinine improved to normal.  Assessment/Plan: Sepsis secondary to acute right pyelonephritis -Met sepsis criteria on admission with fever, tachycardia, AKI and no source -CT abdomen finding as above.  Urinalysis at the urologist office suggestive of UTI. -WBC and lactic acid level normal.  Continue to monitor on IV Rocephin. -Continue gentle hydration-LR at 50 mill per hour. -Monitor urine culture and blood culture. -Morphine low-dose as needed for pain. Recent Labs  Lab 09/29/20 0839 10/03/20 1305 10/03/20 2255  10/04/20 0503  WBC 12.2* 6.6  --  5.0  LATICACIDVEN  --   --  0.8  --    AKI -Creatinine improved with IV fluid. Recent Labs    10/13/19 1151 11/10/19 0942 01/08/20 1253 06/21/20 0902 09/09/20 1437 10/03/20 1305 10/04/20 0503  BUN '17 18 17 ' 33*  --  40* 33*  CREATININE 1.07* 0.93 0.74 0.89 1.00 1.37* 0.87   Hypothyroidism  -Continue Synthroid.  Hyperlipidemia -Continue statin.  History of lung cancer status post lobectomy  History of renal cell carcinoma status post left-sided nephrectomy  -Outpatient follow-up with oncologist, CT surgery and urologist.    Chronic iron deficiency anemia -Continue iron supplement Recent Labs    11/10/19 0942 01/08/20 1253 03/14/20 1326 06/21/20 0902 09/29/20 0839 10/03/20 1305 10/04/20 0503  HGB 8.3* 11.4* 9.8* 11.5* 14.3 13.6 11.0*  MCV 89.4 91.6 98.1 92.3 96.0 92.6 96.7  VITAMINB12 469 526  --   --   --   --   --   FOLATE 61.2 36.4  --   --   --   --   --   FERRITIN 10* 142 13 9* 26  --   --   TIBC 413 326 389 488* 390  --   --   IRON 43 96 34 27* 84  --   --    Mobility: PT evaluation ordered Code Status:   Code Status: Full Code  Nutritional status: Body mass index is 24.47 kg/m.     Diet Order            Diet regular Room service appropriate? Yes; Fluid consistency: Thin  Diet  effective now                 DVT prophylaxis: enoxaparin (LOVENOX) injection 40 mg Start: 10/04/20 1000   Antimicrobials:  IV Rocephin Fluid: LR at 50 mill per hour Consultants: None Family Communication:  None at bedside  Status is: Observation  The patient will require care spanning > 2 midnights and should be moved to inpatient because: Needs continuous IV antibiotics, IV fluids,  Dispo: The patient is from: Home              Anticipated d/c is to: Pending PT eval              Anticipated d/c date is: 2 days              Patient currently is not medically stable to d/c.   Difficult to place patient No       Infusions:   . cefTRIAXone (ROCEPHIN)  IV    . lactated ringers 125 mL/hr at 10/04/20 1131    Scheduled Meds: . enoxaparin (LOVENOX) injection  40 mg Subcutaneous Q24H  . ferrous sulfate  325 mg Oral Q breakfast  . icosapent Ethyl  2 g Oral BID  . levothyroxine  100 mcg Oral QAC breakfast  . simvastatin  40 mg Oral q1800    Antimicrobials: Anti-infectives (From admission, onward)   Start     Dose/Rate Route Frequency Ordered Stop   10/04/20 2300  cefTRIAXone (ROCEPHIN) 2 g in sodium chloride 0.9 % 100 mL IVPB        2 g 200 mL/hr over 30 Minutes Intravenous Every 24 hours 10/04/20 0134     10/03/20 2345  cefTRIAXone (ROCEPHIN) 1 g in sodium chloride 0.9 % 100 mL IVPB        1 g 200 mL/hr over 30 Minutes Intravenous  Once 10/03/20 2335 10/04/20 0124   10/03/20 2245  cefTRIAXone (ROCEPHIN) 1 g in sodium chloride 0.9 % 100 mL IVPB        1 g 200 mL/hr over 30 Minutes Intravenous  Once 10/03/20 2241 10/03/20 2330      PRN meds: acetaminophen **OR** acetaminophen, morphine injection   Objective: Vitals:   10/04/20 0536 10/04/20 1000  BP: (!) 112/54 (!) 115/57  Pulse: 74 84  Resp: 18 20  Temp: 98.1 F (36.7 C) 97.8 F (36.6 C)  SpO2: 95% 96%    Intake/Output Summary (Last 24 hours) at 10/04/2020 1305 Last data filed at 10/04/2020 1000 Gross per 24 hour  Intake 1440 ml  Output 250 ml  Net 1190 ml   Filed Weights   10/04/20 0337  Weight: 66.7 kg   Weight change:  Body mass index is 24.47 kg/m.   Physical Exam: General exam: Pleasant, elderly Caucasian female.  Mild distress because of lower abdominal pain Skin: No rashes, lesions or ulcers. HEENT: Atraumatic, normocephalic, no obvious bleeding Lungs: Clear to auscultate bilaterally CVS: Regular rate and rhythm, no murmur GI/Abd soft, tenderness present in lower abdomen, bowel sound present CNS: Alert, awake, oriented to place and person Psychiatry: Mood appropriate Extremities: No pedal edema, no calf tenderness  Data  Review: I have personally reviewed the laboratory data and studies available.  Recent Labs  Lab 09/29/20 0839 10/03/20 1305 10/04/20 0503  WBC 12.2* 6.6 5.0  NEUTROABS 9.7*  --   --   HGB 14.3 13.6 11.0*  HCT 45.5 42.3 35.2*  MCV 96.0 92.6 96.7  PLT 245 240 192   Recent Labs  Lab 10/03/20 1305 10/04/20 0503  NA 140 140  K 3.8 3.8  CL 103 106  CO2 23 25  GLUCOSE 109* 126*  BUN 40* 33*  CREATININE 1.37* 0.87  CALCIUM 9.3 7.8*    F/u labs ordered  Signed, Terrilee Croak, MD [facility] 10/04/2020

## 2020-10-04 NOTE — H&P (Signed)
History and Physical    [name] FGH:[id number] DOB: 08-Mar-1941 DOA: 10/03/2020  PCP: [facility]  Patient coming from: Home.  Chief Complaint: Right-sided flank pain.  HPI: [name] is a 80 y.o. female with history of renal cell cancer status post left-sided nephrectomy history of lung cancer status post lobectomy being followed by oncologist and had a follow-up with patient's urologist yesterday and was advised to come to the ER because of the right flank pain. Patient states she has been having right flank pain for the last 1 week has been active dysuria and some hematuria. Denies nausea vomiting or diarrhea.  ED Course: In the ER patient was tachycardic with a fever of 101.6. Per the ER physician and UA done at the urology facility was consistent with UTI. Patient is yet to give a sample of urine in the ER. CT abdomen pelvis done in the ER shows features concerning for ascending urinary tract infection versus recently passed stone. Given the symptoms and history of only single kidney patient admitted for further observation and started on ceftriaxone for possible pyelonephritis. Labs are significant for creatinine of 1.3 which is increased from October last year of 0.8. Covid test is negative.  Review of Systems: As per HPI, rest all negative.   Past Medical History:  Diagnosis Date  . Adenocarcinoma of left breast 04/16/2012   Stage II (T2 N0 M0) left-sided adenocarcinoma breast diagnosed in 1990 by Dr. Romona Curls and treated by Dr. Juanita Craver on NSAB PPD-19 with CMF followed by 5 years of tamoxifen and she remains disease free thus far.   . Breast cancer ([place])    left breast/mastectomy/dx approx1990  . Gall bladder stones   . Grave's disease   . High triglycerides   . History of kidney stones    small stone  rt   . Nerve compression    pinched in neck  . Pneumonia 1990  . PONV (postoperative nausea and vomiting)     Past Surgical History:  Procedure  Laterality Date  . CARPAL TUNNEL RELEASE     rt. hand  . cataract surgery     bilateral  . COLONOSCOPY    . EYE SURGERY    . LAPAROSCOPIC NEPHRECTOMY Left 03/25/2019   Procedure: LAPAROSCOPIC RADICAL NEPHRECTOMY;  Surgeon: Ceasar Mons, MD;  Location: [facility];  Service: Urology;  Laterality: Left;  Marland Kitchen MASTECTOMY     left.  Marland Kitchen NODE DISSECTION Left 09/11/2019   Procedure: Node Dissection;  Surgeon: [name], MD;  Location: [facility];  Service: Thoracic;  Laterality: Left;  . THORACOTOMY/LOBECTOMY Left 09/11/2019   Procedure: Thoracotomy/Lobectomy Left Upper Lobe;  Surgeon: [name], MD;  Location: [facility];  Service: Thoracic;  Laterality: Left;  . TONSILLECTOMY       reports that she quit smoking about a year ago. Her smoking use included cigarettes. She has a 60.00 pack-year smoking history. She has never used smokeless tobacco. She reports that she does not drink alcohol and does not use drugs.  Allergies  Allergen Reactions  . Chantix [Varenicline]     depression    Family History  Problem Relation Age of Onset  . Cancer Father        lung cancer  . Cancer Brother        colon cancer  . Cancer Other        lung cancer/died age 87    Prior to Admission medications   Medication Sig  Start Date End Date Taking? Authorizing Provider  acetaminophen (TYLENOL) 500 MG tablet Take 500 mg by mouth every 6 (six) hours as needed for mild pain, moderate pain or headache.    Yes [provider]  Calcium-Vitamin D ([employer] CALCIUM-600/VIT D PO) Take 1 capsule by mouth daily.   Yes [provider]  ferrous sulfate 325 (65 FE) MG EC tablet Take 325 mg by mouth daily.   Yes [provider]  hydroxypropyl methylcellulose / hypromellose (ISOPTO TEARS / GONIOVISC) 2.5 % ophthalmic solution Place 1 drop into both eyes 3 (three) times daily as needed for dry eyes.    Yes [provider]  levothyroxine (SYNTHROID, LEVOTHROID) 100 MCG tablet  Take 100 mcg by mouth daily before breakfast.  04/04/18  Yes [provider]  meclizine (ANTIVERT) 25 MG tablet Take 25 mg by mouth daily as needed for dizziness or nausea. 05/24/20  Yes [provider]  Multiple Vitamins-Minerals (CENTRUM SILVER ULTRA WOMENS) TABS Take 1 tablet by mouth daily.   Yes [provider]  simvastatin (ZOCOR) 40 MG tablet Take 40 mg by mouth daily at 6 PM.  02/18/20  Yes [provider]  VASCEPA 1 g capsule Take 2 g by mouth 2 (two) times daily. 08/30/20  Yes [provider]    Physical Exam: Constitutional: Moderately built and nourished. Vitals:   10/03/20 2130 10/03/20 2202 10/03/20 2215 10/03/20 2230  BP: 119/66  (!) 107/59 108/64  Pulse: 93  85 82  Resp: (!) 27  (!) 21 20  Temp:  (!) 101.6 F (38.7 C)    TempSrc:  Rectal    SpO2: 94%  91% 90%   Eyes: Anicteric no pallor. ENMT: No discharge from the ears eyes nose or mouth. Neck: No mass felt. No neck rigidity. Respiratory: No rhonchi or crepitations. Cardiovascular: S1-S2 heard. Abdomen: Soft mild tenderness in the right flank area no guarding or rigidity. Musculoskeletal: No edema. Skin: No rash. Neurologic: Alert awake oriented to time place and person. Moves all extremities. Psychiatric: Appears normal. Normal affect.   Labs on Admission: I have personally reviewed following labs and imaging studies  CBC: Recent Labs  Lab 09/29/20 0839 10/03/20 1305  WBC 12.2* 6.6  NEUTROABS 9.7*  --   HGB 14.3 13.6  HCT 45.5 42.3  MCV 96.0 92.6  PLT 245 299   Basic Metabolic Panel: Recent Labs  Lab 10/03/20 1305  NA 140  K 3.8  CL 103  CO2 23  GLUCOSE 109*  BUN 40*  CREATININE 1.37*  CALCIUM 9.3   GFR: CrCl cannot be calculated (Unknown ideal weight.). Liver Function Tests: Recent Labs  Lab 10/03/20 1305  AST 21  ALT 19  ALKPHOS 77  BILITOT 1.2  PROT 7.9  ALBUMIN 4.1   Recent Labs  Lab 10/03/20 1305  LIPASE 30   No results for  input(s): AMMONIA in the last 168 hours. Coagulation Profile: No results for input(s): INR, PROTIME in the last 168 hours. Cardiac Enzymes: No results for input(s): CKTOTAL, CKMB, CKMBINDEX, TROPONINI in the last 168 hours. BNP (last 3 results) No results for input(s): PROBNP in the last 8760 hours. HbA1C: No results for input(s): HGBA1C in the last 72 hours. CBG: No results for input(s): GLUCAP in the last 168 hours. Lipid Profile: No results for input(s): CHOL, HDL, LDLCALC, TRIG, CHOLHDL, LDLDIRECT in the last 72 hours. Thyroid Function Tests: No results for input(s): TSH, T4TOTAL, FREET4, T3FREE, THYROIDAB in the last 72 hours. Anemia Panel: No  results for input(s): VITAMINB12, FOLATE, FERRITIN, TIBC, IRON, RETICCTPCT in the last 72 hours. Urine analysis:    Component Value Date/Time   COLORURINE YELLOW 09/09/2019 1310   APPEARANCEUR CLEAR 09/09/2019 1310   LABSPEC 1.008 09/09/2019 1310   PHURINE 5.0 09/09/2019 1310   GLUCOSEU NEGATIVE 09/09/2019 1310   HGBUR NEGATIVE 09/09/2019 1310   BILIRUBINUR NEGATIVE 09/09/2019 1310   KETONESUR NEGATIVE 09/09/2019 1310   PROTEINUR NEGATIVE 09/09/2019 1310   NITRITE NEGATIVE 09/09/2019 1310   LEUKOCYTESUR TRACE (A) 09/09/2019 1310   Sepsis Labs: @LABRCNTIP (procalcitonin:4,lacticidven:4) ) Recent Results (from the past 240 hour(s))  SARS Coronavirus 2 by RT PCR (hospital order, performed in [facility] hospital lab) Nasopharyngeal Nasopharyngeal Swab     Status: None   Collection Time: 10/03/20 10:55 PM   Specimen: Nasopharyngeal Swab  Result Value Ref Range Status   SARS Coronavirus 2 NEGATIVE NEGATIVE Final    Comment: (NOTE) SARS-CoV-2 target nucleic acids are NOT DETECTED.  The SARS-CoV-2 RNA is generally detectable in upper and lower respiratory specimens during the acute phase of infection. The lowest concentration of SARS-CoV-2 viral copies this assay can detect is 250 copies / mL. A negative result does not preclude  SARS-CoV-2 infection and should not be used as the sole basis for treatment or other patient management decisions.  A negative result may occur with improper specimen collection / handling, submission of specimen other than nasopharyngeal swab, presence of viral mutation(s) within the areas targeted by this assay, and inadequate number of viral copies (<250 copies / mL). A negative result must be combined with clinical observations, patient history, and epidemiological information.  Fact Sheet for Patients:   StrictlyIdeas.no  Fact Sheet for Healthcare Providers: BankingDealers.co.za  This test is not yet approved or  cleared by the [place] FDA and has been authorized for detection and/or diagnosis of SARS-CoV-2 by FDA under an Emergency Use Authorization (EUA).  This EUA will remain in effect (meaning this test can be used) for the duration of the COVID-19 declaration under Section 564(b)(1) of the Act, 21 U.S.C. section 360bbb-3(b)(1), unless the authorization is terminated or revoked sooner.  Performed at [facility], [place] [address] [place], [place] [address]      Radiological Exams on Admission: CT ABDOMEN PELVIS WO CONTRAST  Result Date: 10/03/2020 CLINICAL DATA:  Abdominal pain and hematuria. History of kidney cancer with nephrectomy. Right-sided abdominal pain. EXAM: CT ABDOMEN AND PELVIS WITHOUT CONTRAST TECHNIQUE: Multidetector CT imaging of the abdomen and pelvis was performed following the standard protocol without IV contrast. COMPARISON:  September 09, 2020 FINDINGS: Lower chest: The lung bases are clear. The heart size is normal. Hepatobiliary: There is decreased hepatic attenuation suggestive of hepatic steatosis. Cholelithiasis without acute inflammation.There is no biliary ductal dilation. Pancreas: Normal contours without ductal dilatation. No peripancreatic fluid collection. Spleen: Unremarkable.  Adrenals/Urinary Tract: --Adrenal glands: There is persistent left adrenal thickening which is essentially stable from prior study. --Right kidney/ureter: There is mild right-sided collecting system dilatation without evidence for an obstructing stone. There is wall thickening of the right ureter. --Left kidney/ureter: The patient is status post prior left-sided nephrectomy. --Urinary bladder: There is mild bladder wall thickening. Stomach/Bowel: --Stomach/Duodenum: No hiatal hernia or other gastric abnormality. Normal duodenal course and caliber. --Small bowel: Unremarkable. --Colon: Unremarkable. --Appendix: Normal. Vascular/Lymphatic: Atherosclerotic calcification is present within the non-aneurysmal abdominal aorta, without hemodynamically significant stenosis. --No retroperitoneal lymphadenopathy. --No mesenteric lymphadenopathy. --No pelvic or inguinal lymphadenopathy. Reproductive: Unremarkable Other: No ascites or free air. There  is a fat containing left inguinal hernia. Musculoskeletal. No acute displaced fractures. IMPRESSION: 1. Mild right-sided collecting system dilatation without evidence for an obstructing stone. There is wall thickening of the right ureter. Findings may be secondary to a recently passed stone or ascending urinary tract infection. 2. Mild bladder wall thickening. Correlate with urinalysis to exclude cystitis. 3. Hepatic steatosis. 4. Cholelithiasis without acute inflammation. 5. Fat containing left inguinal hernia. Aortic Atherosclerosis (ICD10-I70.0). Electronically Signed   By: Constance Holster M.D.   On: 10/03/2020 21:48   DG Chest Portable 1 View  Result Date: 10/03/2020 CLINICAL DATA:  Cough EXAM: PORTABLE CHEST 1 VIEW COMPARISON:  08/16/2020 FINDINGS: Cardiac shadow is within normal limits. Aortic calcifications are noted. The lungs are clear. Postsurgical changes in the left axilla are noted. No bony abnormality is noted. IMPRESSION: No active disease. Electronically  Signed   By: Inez Catalina M.D.   On: 10/03/2020 23:17     Assessment/Plan Principal Problem:   Pyelonephritis Active Problems:   Adenocarcinoma of left breast   Clear cell renal cell carcinoma, left (HCC)   Lung cancer (HCC)   Iron deficiency anemia   Hypothyroidism    1. Possible pyelonephritis -patient symptoms and CT scan findings are concerning for possible pyelonephritis for which patient is started on ceftriaxone and follow urine cultures. Continue with fluids. Pain relief medications. 2. Hypothyroidism on Synthroid. 3. Hyperlipidemia on statins. 4. Acute renal failure likely could be from poor oral intake which I think will improve with fluids. Creatinine increased from 0.8-1.3. Follow metabolic panel after hydration. 5. History of lung cancer status post lobectomy being followed by oncologist and cardiothoracic surgery. 6. History of renal cell carcinoma status post left-sided nephrectomy patient already has 1 kidney. 7. History of iron deficiency anemia on iron supplements.   DVT prophylaxis: Lovenox. Code Status: Full code. Family Communication: Patient's daughter at the bedside. Disposition Plan: Home. Consults called: None. Admission status: Observation.   Rise Patience MD [facility] Pager [phone].  If 7PM-7AM, please contact night-coverage [email or web address] Password [email or web address]  10/04/2020, 1:34 AM

## 2020-10-05 ENCOUNTER — Ambulatory Visit (HOSPITAL_COMMUNITY): Payer: Medicare PPO | Admitting: Hematology

## 2020-10-05 DIAGNOSIS — E039 Hypothyroidism, unspecified: Secondary | ICD-10-CM

## 2020-10-05 DIAGNOSIS — N179 Acute kidney failure, unspecified: Secondary | ICD-10-CM | POA: Diagnosis present

## 2020-10-05 DIAGNOSIS — N12 Tubulo-interstitial nephritis, not specified as acute or chronic: Secondary | ICD-10-CM | POA: Diagnosis not present

## 2020-10-05 DIAGNOSIS — N390 Urinary tract infection, site not specified: Secondary | ICD-10-CM | POA: Diagnosis present

## 2020-10-05 DIAGNOSIS — A4151 Sepsis due to Escherichia coli [E. coli]: Secondary | ICD-10-CM

## 2020-10-05 DIAGNOSIS — B9620 Unspecified Escherichia coli [E. coli] as the cause of diseases classified elsewhere: Secondary | ICD-10-CM

## 2020-10-05 DIAGNOSIS — D508 Other iron deficiency anemias: Secondary | ICD-10-CM

## 2020-10-05 DIAGNOSIS — C3490 Malignant neoplasm of unspecified part of unspecified bronchus or lung: Secondary | ICD-10-CM

## 2020-10-05 DIAGNOSIS — C642 Malignant neoplasm of left kidney, except renal pelvis: Secondary | ICD-10-CM

## 2020-10-05 DIAGNOSIS — R6520 Severe sepsis without septic shock: Secondary | ICD-10-CM

## 2020-10-05 LAB — BASIC METABOLIC PANEL
Anion gap: 11 (ref 5–15)
BUN: 15 mg/dL (ref 8–23)
CO2: 26 mmol/L (ref 22–32)
Calcium: 8.2 mg/dL — ABNORMAL LOW (ref 8.9–10.3)
Chloride: 107 mmol/L (ref 98–111)
Creatinine, Ser: 1.01 mg/dL — ABNORMAL HIGH (ref 0.44–1.00)
GFR, Estimated: 57 mL/min — ABNORMAL LOW (ref >60)
Glucose, Bld: 106 mg/dL — ABNORMAL HIGH (ref 70–99)
Potassium: 3.5 mmol/L (ref 3.5–5.1)
Sodium: 144 mmol/L (ref 135–145)

## 2020-10-05 LAB — CBC WITH DIFFERENTIAL/PLATELET
Abs Immature Granulocytes: 0.03 10*3/uL (ref 0.00–0.07)
Basophils Absolute: 0 10*3/uL (ref 0.0–0.1)
Basophils Relative: 1 %
Eosinophils Absolute: 0.2 10*3/uL (ref 0.0–0.5)
Eosinophils Relative: 3 %
HCT: 36.7 % (ref 36.0–46.0)
Hemoglobin: 11.6 g/dL — ABNORMAL LOW (ref 12.0–15.0)
Immature Granulocytes: 1 %
Lymphocytes Relative: 21 %
Lymphs Abs: 0.9 10*3/uL (ref 0.7–4.0)
MCH: 30.3 pg (ref 26.0–34.0)
MCHC: 31.6 g/dL (ref 30.0–36.0)
MCV: 95.8 fL (ref 80.0–100.0)
Monocytes Absolute: 0.5 10*3/uL (ref 0.1–1.0)
Monocytes Relative: 11 %
Neutro Abs: 2.8 10*3/uL (ref 1.7–7.7)
Neutrophils Relative %: 63 %
Platelets: 220 10*3/uL (ref 150–400)
RBC: 3.83 MIL/uL — ABNORMAL LOW (ref 3.87–5.11)
RDW: 14.8 % (ref 11.5–15.5)
WBC: 4.4 10*3/uL (ref 4.0–10.5)
nRBC: 0 % (ref 0.0–0.2)

## 2020-10-05 IMAGING — DX DG CHEST 1V
1 series · 1 of 1 positions shown · non-contrast
Comparison: September 24, 2019

CLINICAL DATA: Lung carcinoma.  Recent postoperative resection

EXAM:
CHEST  1 VIEW

[dg chest 2 view]
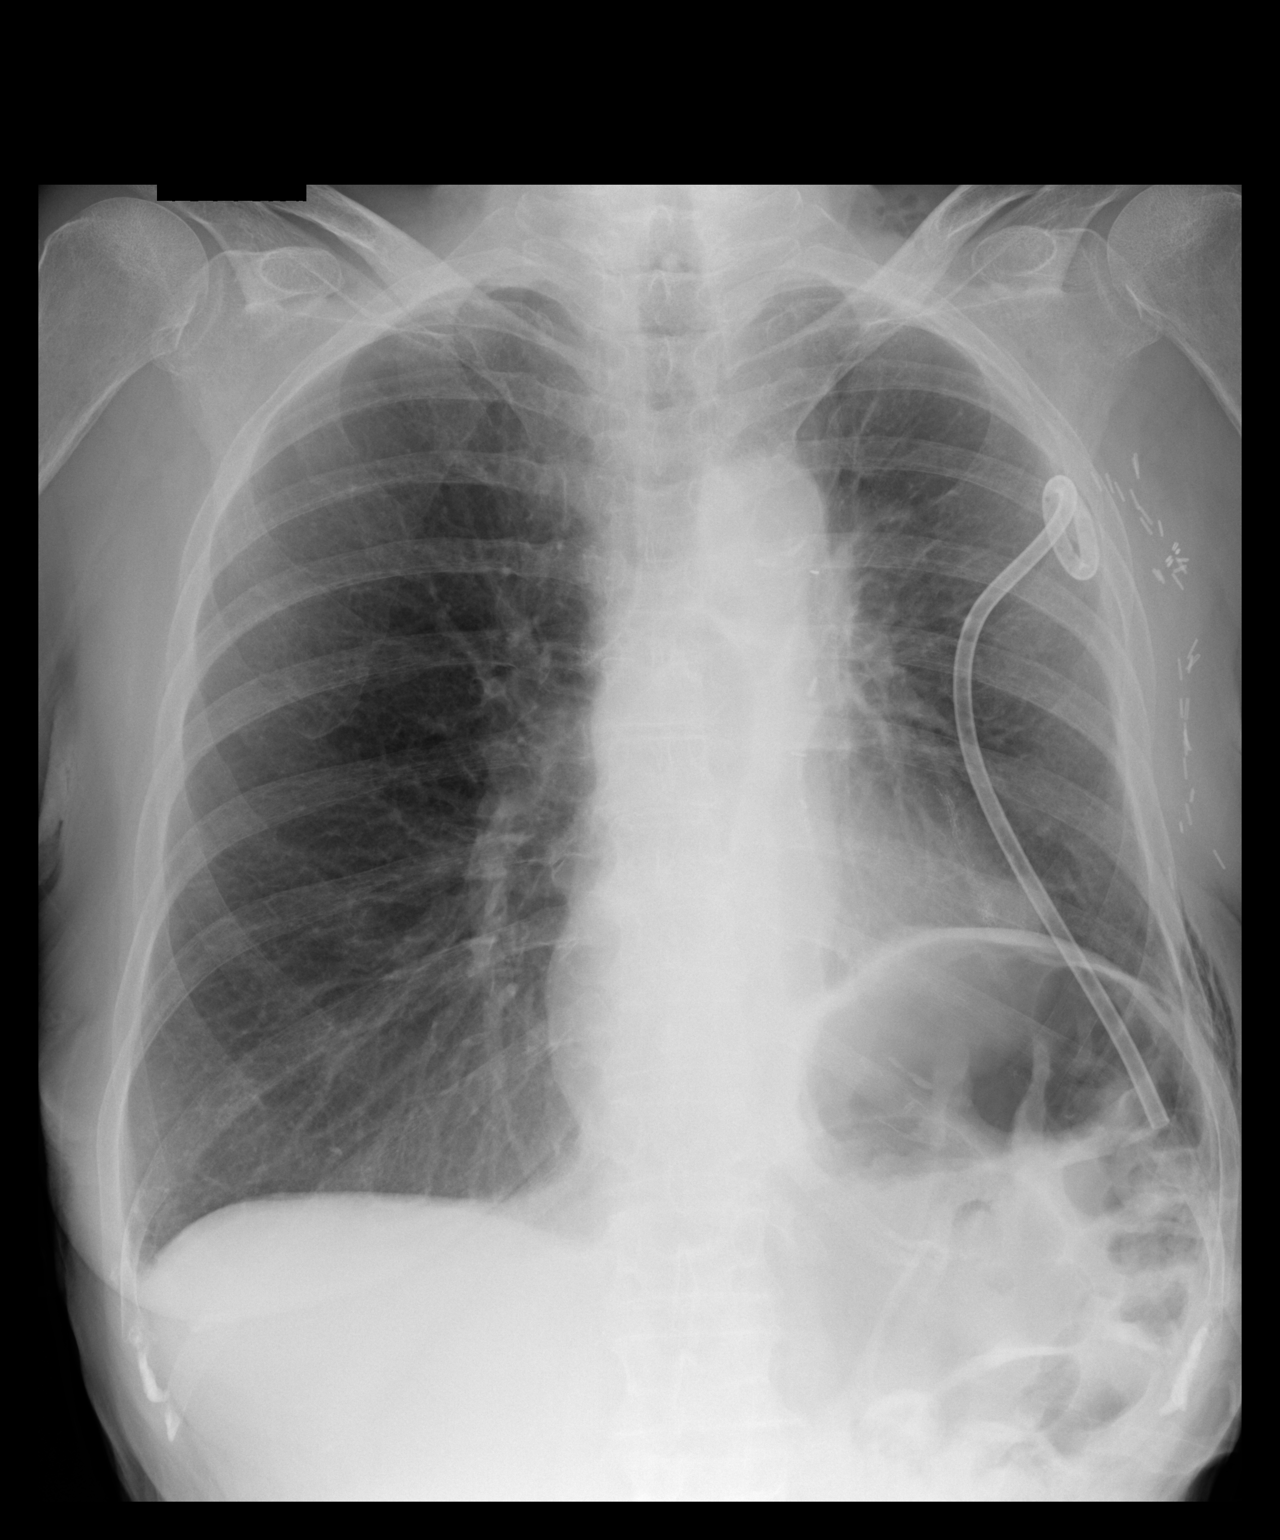

[1 of 1 positions shown; findings below may reference images not displayed]

FINDINGS: Chest tube remains on the left. There is subcutaneous air on the
left but no appreciable pneumothorax currently. Postoperative
changes noted on the left with volume loss. There is stable
elevation of the left hemidiaphragm. The lungs elsewhere are clear.
The heart size and pulmonary vascularity are normal. No adenopathy.
There is aortic atherosclerosis. There are surgical clips in the
left axillary region with evidence of previous mastectomy on the
left.
IMPRESSION: Postoperative change on the left with volume loss. Chest tube
present on the left without appreciable pneumothorax. No edema or
airspace opacity. Cardiac silhouette within normal limits. No
adenopathy. Aortic Atherosclerosis ([id number]).

## 2020-10-05 MED ORDER — MECLIZINE HCL 25 MG PO TABS: 25.0000 mg | ORAL_TABLET | Freq: Every day | ORAL | Status: DC | PRN

## 2020-10-05 MED ORDER — CEPHALEXIN 500 MG PO CAPS: 500.0000 mg | ORAL_CAPSULE | Freq: Three times a day (TID) | ORAL | 0 refills | Status: AC

## 2020-10-05 MED ORDER — POLYVINYL ALCOHOL 1.4 % OP SOLN
1.0000 [drp] | Freq: Three times a day (TID) | OPHTHALMIC | Status: DC | PRN
  Filled 2020-10-05: qty 15

## 2020-10-05 MED ORDER — POTASSIUM CHLORIDE 20 MEQ PO PACK
40.0000 meq | PACK | Freq: Once | ORAL | Status: AC
  Administered 2020-10-05: 40 meq via ORAL
  Filled 2020-10-05: qty 2

## 2020-10-05 NOTE — Progress Notes (Signed)
Discharge instructions given to patient and all questions were answered.  

## 2020-10-05 NOTE — Evaluation (Signed)
Occupational Therapy Evaluation Patient Details Name: [name] MRN: [id number] DOB: November 30, 1940 Today's Date: 10/05/2020    History of Present Illness 80 yo female admitted with sepsis. Hx of renal cell ca, lung ca   Clinical Impression   [name] is a 80 year old woman who presents with above medical history. On evaluation patient demonstrates functional ROM and strength of upper extremities, demonstrates ability to perform in room ambulation and ADLs. Patient needing random hand holds for steadying on furniture and walls and patient reports this is her baseline. Patient has all DME needed at home. No OT needs at this time.    Follow Up Recommendations  No OT follow up    Equipment Recommendations  None recommended by OT    Recommendations for Other Services       Precautions / Restrictions Precautions Precautions: Fall Precaution Comments: hx of vertigo Restrictions Weight Bearing Restrictions: No      Mobility Bed Mobility Overal bed mobility: Modified Independent                  Transfers Overall transfer level: Modified independent               General transfer comment: supervision for safety only during evaluation    Balance Overall balance assessment: Mild deficits observed, not formally tested                                         ADL either performed or assessed with clinical judgement   ADL Overall ADL's : At baseline                                             Vision Patient Visual Report: No change from baseline       Perception     Praxis      Pertinent Vitals/Pain Pain Assessment: No/denies pain     Hand Dominance Right   Extremity/Trunk Assessment Upper Extremity Assessment Upper Extremity Assessment: Overall WFL for tasks assessed   Lower Extremity Assessment Lower Extremity Assessment: Defer to PT evaluation   Cervical / Trunk Assessment Cervical / Trunk  Assessment: Normal   Communication Communication Communication: No difficulties   Cognition Arousal/Alertness: Awake/alert Behavior During Therapy: WFL for tasks assessed/performed Overall Cognitive Status: Within Functional Limits for tasks assessed                                     General Comments       Exercises     Shoulder Instructions      Home Living Family/patient expects to be discharged to:: Private residence Living Arrangements: Children (daughter works nights) Available Help at Discharge: Family;Available PRN/intermittently Type of Home: House Home Access: Stairs to enter     Home Layout: Two level Alternate Level Stairs-Number of Steps: 1 flight   Bathroom Shower/Tub: Tub/shower unit         Home Equipment: Cane - single point;Toilet riser;Tub bench          Prior Functioning/Environment Level of Independence: Independent with assistive device(s)  Gait / Transfers Assistance Needed: uses cane for ambulation. daughter helps pt up/down stairs. ADL's / Homemaking Assistance Needed: Independent with ADLs.  Assistance from children for IADLs. Report she no longer drives.            OT Problem List:        OT Treatment/Interventions:      OT Goals(Current goals can be found in the care plan section) Acute Rehab OT Goals OT Goal Formulation: All assessment and education complete, DC therapy  OT Frequency:     Barriers to D/C:            Co-evaluation              AM-PAC OT "6 Clicks" Daily Activity     Outcome Measure Help from another person eating meals?: None Help from another person taking care of personal grooming?: None Help from another person toileting, which includes using toliet, bedpan, or urinal?: None Help from another person bathing (including washing, rinsing, drying)?: None Help from another person to put on and taking off regular upper body clothing?: None Help from another person to put on and taking  off regular lower body clothing?: None 6 Click Score: 24   End of Session Nurse Communication:  (okay to see per RN)  Activity Tolerance: Patient tolerated treatment well Patient left: in bed  OT Visit Diagnosis: Unsteadiness on feet (R26.81)                Time: 2957-4734 OT Time Calculation (min): 10 min Charges:  OT General Charges $OT Visit: 1 Visit OT Evaluation $OT Eval Low Complexity: 1 Low  Yobany Vroom, OTR/L [place]  Office [phone] Pager: [place] 10/05/2020, 1:59 PM

## 2020-10-05 NOTE — Discharge Summary (Signed)
Physician Discharge Summary  [name] GMW:[id number] DOB: 12-05-1940 DOA: 10/03/2020  PCP: Alanson Puls [facility] date: 10/03/2020 Discharge date: 10/05/2020  Time spent: 55 minutes  Recommendations for Outpatient Follow-up:  1. Follow-up with [facility], [facility] in 1 to 2 weeks.  On follow-up patient will need a basic metabolic profile done to follow-up on electrolytes and renal function. 2. Follow-up with Daine Gravel, NP, urology in 2 weeks.   Discharge Diagnoses:  Principal Problem:   Sepsis due to Escherichia coli with acute renal failure without septic shock (HCC) Active Problems:   Pyelonephritis   E. coli UTI   Adenocarcinoma of left breast   Clear cell renal cell carcinoma, left (HCC)   Lung cancer (HCC)   Iron deficiency anemia   Hypothyroidism   AKI (acute kidney injury) ([place])   Discharge Condition: Stable and improved  Diet recommendation: Regular  Filed Weights   10/04/20 0337  Weight: 66.7 kg    History of present illness:  HPI per Dr. Adine Madura is a 80 y.o. female with history of renal cell cancer status post left-sided nephrectomy history of lung cancer status post lobectomy being followed by oncologist and had a follow-up with patient's urologist yesterday and was advised to come to the ER because of the right flank pain. Patient stated she had been having right flank pain for the last 1 week had been active dysuria and some hematuria. Denied nausea vomiting or diarrhea.  ED Course: In the ER patient was tachycardic with a fever of 101.6. Per the ER physician and UA done at the urology facility was consistent with UTI. Patient is yet to give a sample of urine in the ER. CT abdomen pelvis done in the ER showed features concerning for ascending urinary tract infection versus recently passed stone. Given the symptoms and history of only single kidney patient admitted for further observation and started on ceftriaxone for  possible pyelonephritis. Labs are significant for creatinine of 1.3 which is increased from October last year of 0.8. Covid test is negative.  Hospital Course:  1 sepsis secondary to acute right pyelonephritis/E. coli UTI Patient on admission met criteria for sepsis with fever, tachycardia, acute kidney injury likely urinary source.  CT abdomen and pelvis which was done showed mild right-sided collecting system dilatation without evidence of obstructing stone.  Wall thickening of the right ureter.  Findings may be secondary to recently passed stone or ascending UTI.  Mild bladder wall thickening.  Hepatic steatosis.  Patient placed empirically on IV Rocephin and pancultured.  Cultures were pending on day of discharge.  Urine cultures from urologist office was consistent with a E. coli UTI and pansensitive.  Patient improved clinically.  Remained afebrile.  Right flank pain improved and had resolved by day of discharge.  Leukocytosis trended down.  Case discussed with ID and recommendations of 10-day course of antibiotic treatment.  Patient be discharged home on Keflex 500 mg 3 times daily x8 more days to complete a 10-day course.  Outpatient follow-up with urology and PCP.  2.  Acute kidney injury/history of left-sided nephrectomy Patient noted to be in acute kidney injury on admission and being treated for acute pyelonephritis.  Patient placed on IV antibiotics, hydrated with IV fluids with improvement in renal function such that by day of discharge creatinine was down to 1.00.  Close outpatient follow-up with PCP.  3.  Hypothyroidism Patient maintained on home regimen Synthroid.  4.  Hyperlipidemia Patient maintained  on home regimen statin.  5.  History of lung cancer status post lobectomy/history of renal cell carcinoma status post left-sided nephrectomy Outpatient follow-up with oncology and urology.  6.  Chronic iron deficiency anemia Hemoglobin remained stable.  Patient maintained on oral  iron supplementation.  Procedures:  CT abdomen and pelvis 10/03/2020  Chest x-ray 10/03/2020  Consultations:  None  Discharge Exam: Vitals:   10/05/20 0800 10/05/20 1316  BP: 124/68 135/76  Pulse: 80 75  Resp: 18   Temp: 98 F (36.7 C) 98 F (36.7 C)  SpO2: 94% 94%    General: NAD Cardiovascular: RRR Respiratory: CTAB  Discharge Instructions   Discharge Instructions    Diet general   Complete by: As directed    Increase activity slowly   Complete by: As directed      Allergies as of 10/05/2020      Reactions   Chantix [varenicline]    depression      Medication List    TAKE these medications   acetaminophen 500 MG tablet Commonly known as: TYLENOL Take 500 mg by mouth every 6 (six) hours as needed for mild pain, moderate pain or headache.   Centrum Silver Ultra Womens Tabs Take 1 tablet by mouth daily.   cephALEXin 500 MG capsule Commonly known as: KEFLEX Take 1 capsule (500 mg total) by mouth 3 (three) times daily for 8 days.   [employer] CALCIUM-600/VIT D PO Take 1 capsule by mouth daily.   ferrous sulfate 325 (65 FE) MG EC tablet Take 325 mg by mouth daily.   hydroxypropyl methylcellulose / hypromellose 2.5 % ophthalmic solution Commonly known as: ISOPTO TEARS / GONIOVISC Place 1 drop into both eyes 3 (three) times daily as needed for dry eyes.   levothyroxine 100 MCG tablet Commonly known as: SYNTHROID Take 100 mcg by mouth daily before breakfast.   meclizine 25 MG tablet Commonly known as: ANTIVERT Take 25 mg by mouth daily as needed for dizziness or nausea.   simvastatin 40 MG tablet Commonly known as: ZOCOR Take 40 mg by mouth daily at 6 PM.   Vascepa 1 g capsule Generic drug: icosapent Ethyl Take 2 g by mouth 2 (two) times daily.      Allergies  Allergen Reactions  . Chantix [Varenicline]     depression    Follow-up Information    [facility]. Schedule an appointment as soon as possible for a visit in 1 week(s).    Why: f/u in 1-2 weeks. Contact information: [place] [address] [phone]        Hollace Hayward, NP. Schedule an appointment as soon as possible for a visit in 2 week(s).   Contact information: [address] [place] [address] [phone]                The results of significant diagnostics from this hospitalization (including imaging, microbiology, ancillary and laboratory) are listed below for reference.    Significant Diagnostic Studies: CT ABDOMEN PELVIS W WO CONTRAST  Result Date: 09/09/2020 CLINICAL DATA:  Follow-up left lung squamous cell carcinoma and renal cell carcinoma. EXAM: CT CHEST WITH CONTRAST CT ABDOMEN AND PELVIS WITH AND WITHOUT CONTRAST TECHNIQUE: Multidetector CT imaging of the chest was performed during intravenous contrast administration. Multidetector CT imaging of the abdomen and pelvis was performed following the standard protocol before and during bolus administration of intravenous contrast. CONTRAST:  120m OMNIPAQUE IOHEXOL 300 MG/ML  SOLN COMPARISON:  Chest CT  on 06/21/2020, and AP CT on 07/08/2019 FINDINGS: CT CHEST FINDINGS Cardiovascular: No acute findings. Aortic and coronary atherosclerotic calcification noted. Mediastinum/Lymph Nodes: No masses or pathologically enlarged lymph nodes identified. Lungs/Pleura: Stable postop changes from left upper lobectomy. Mild-to-moderate centrilobular emphysema again noted. No pulmonary infiltrate or mass identified. No effusion present. Musculoskeletal:  No suspicious bone lesions identified. CT ABDOMEN AND PELVIS FINDINGS Hepatobiliary: No masses identified. Gallstones are seen, however there is no evidence of cholecystitis or biliary dilatation. Pancreas:  No mass or inflammatory changes. Spleen:  Within normal limits in size and appearance. Adrenals/Urinary tract: Stable nodular hypertrophy of left adrenal gland, without evidence of mass. Stable postop changes from left  nephrectomy. No evidence of recurrent mass in the nephrectomy bed. A few tiny sub-cm cysts in the right kidney remains stable. No evidence of right renal mass or hydronephrosis. Unremarkable unopacified urinary bladder. Stomach/Bowel: No evidence of obstruction, inflammatory process, or abnormal fluid collections. Normal appendix visualized. Diverticulosis is again seen mainly involving the descending colon, however there is no evidence of diverticulitis. Vascular/Lymphatic: No pathologically enlarged lymph nodes identified. No abdominal aortic aneurysm. Aortic atherosclerotic calcification noted. Reproductive:  No mass or other significant abnormality identified. Other: Stable tiny midline epigastric ventral abdominal wall hernia, which contains only fat. Musculoskeletal:  No suspicious bone lesions identified. IMPRESSION: Stable postop changes from left upper lobectomy and left nephrectomy. No evidence of recurrent or metastatic carcinoma within the chest, abdomen, or pelvis. Cholelithiasis. No radiographic evidence of cholecystitis. Colonic diverticulosis, without radiographic evidence of diverticulitis. Aortic Atherosclerosis (ICD10-I70.0) and Emphysema (ICD10-J43.9). Electronically Signed   By: Marlaine Hind M.D.   On: 09/09/2020 17:09   CT ABDOMEN PELVIS WO CONTRAST  Result Date: 10/03/2020 CLINICAL DATA:  Abdominal pain and hematuria. History of kidney cancer with nephrectomy. Right-sided abdominal pain. EXAM: CT ABDOMEN AND PELVIS WITHOUT CONTRAST TECHNIQUE: Multidetector CT imaging of the abdomen and pelvis was performed following the standard protocol without IV contrast. COMPARISON:  September 09, 2020 FINDINGS: Lower chest: The lung bases are clear. The heart size is normal. Hepatobiliary: There is decreased hepatic attenuation suggestive of hepatic steatosis. Cholelithiasis without acute inflammation.There is no biliary ductal dilation. Pancreas: Normal contours without ductal dilatation. No  peripancreatic fluid collection. Spleen: Unremarkable. Adrenals/Urinary Tract: --Adrenal glands: There is persistent left adrenal thickening which is essentially stable from prior study. --Right kidney/ureter: There is mild right-sided collecting system dilatation without evidence for an obstructing stone. There is wall thickening of the right ureter. --Left kidney/ureter: The patient is status post prior left-sided nephrectomy. --Urinary bladder: There is mild bladder wall thickening. Stomach/Bowel: --Stomach/Duodenum: No hiatal hernia or other gastric abnormality. Normal duodenal course and caliber. --Small bowel: Unremarkable. --Colon: Unremarkable. --Appendix: Normal. Vascular/Lymphatic: Atherosclerotic calcification is present within the non-aneurysmal abdominal aorta, without hemodynamically significant stenosis. --No retroperitoneal lymphadenopathy. --No mesenteric lymphadenopathy. --No pelvic or inguinal lymphadenopathy. Reproductive: Unremarkable Other: No ascites or free air. There is a fat containing left inguinal hernia. Musculoskeletal. No acute displaced fractures. IMPRESSION: 1. Mild right-sided collecting system dilatation without evidence for an obstructing stone. There is wall thickening of the right ureter. Findings may be secondary to a recently passed stone or ascending urinary tract infection. 2. Mild bladder wall thickening. Correlate with urinalysis to exclude cystitis. 3. Hepatic steatosis. 4. Cholelithiasis without acute inflammation. 5. Fat containing left inguinal hernia. Aortic Atherosclerosis (ICD10-I70.0). Electronically Signed   By: Constance Holster M.D.   On: 10/03/2020 21:48   CT CHEST W CONTRAST  Result Date: 09/09/2020 CLINICAL DATA:  Follow-up left  lung squamous cell carcinoma and renal cell carcinoma. EXAM: CT CHEST WITH CONTRAST CT ABDOMEN AND PELVIS WITH AND WITHOUT CONTRAST TECHNIQUE: Multidetector CT imaging of the chest was performed during intravenous contrast  administration. Multidetector CT imaging of the abdomen and pelvis was performed following the standard protocol before and during bolus administration of intravenous contrast. CONTRAST:  120m OMNIPAQUE IOHEXOL 300 MG/ML  SOLN COMPARISON:  Chest CT on 06/21/2020, and AP CT on 07/08/2019 FINDINGS: CT CHEST FINDINGS Cardiovascular: No acute findings. Aortic and coronary atherosclerotic calcification noted. Mediastinum/Lymph Nodes: No masses or pathologically enlarged lymph nodes identified. Lungs/Pleura: Stable postop changes from left upper lobectomy. Mild-to-moderate centrilobular emphysema again noted. No pulmonary infiltrate or mass identified. No effusion present. Musculoskeletal:  No suspicious bone lesions identified. CT ABDOMEN AND PELVIS FINDINGS Hepatobiliary: No masses identified. Gallstones are seen, however there is no evidence of cholecystitis or biliary dilatation. Pancreas:  No mass or inflammatory changes. Spleen:  Within normal limits in size and appearance. Adrenals/Urinary tract: Stable nodular hypertrophy of left adrenal gland, without evidence of mass. Stable postop changes from left nephrectomy. No evidence of recurrent mass in the nephrectomy bed. A few tiny sub-cm cysts in the right kidney remains stable. No evidence of right renal mass or hydronephrosis. Unremarkable unopacified urinary bladder. Stomach/Bowel: No evidence of obstruction, inflammatory process, or abnormal fluid collections. Normal appendix visualized. Diverticulosis is again seen mainly involving the descending colon, however there is no evidence of diverticulitis. Vascular/Lymphatic: No pathologically enlarged lymph nodes identified. No abdominal aortic aneurysm. Aortic atherosclerotic calcification noted. Reproductive:  No mass or other significant abnormality identified. Other: Stable tiny midline epigastric ventral abdominal wall hernia, which contains only fat. Musculoskeletal:  No suspicious bone lesions identified.  IMPRESSION: Stable postop changes from left upper lobectomy and left nephrectomy. No evidence of recurrent or metastatic carcinoma within the chest, abdomen, or pelvis. Cholelithiasis. No radiographic evidence of cholecystitis. Colonic diverticulosis, without radiographic evidence of diverticulitis. Aortic Atherosclerosis (ICD10-I70.0) and Emphysema (ICD10-J43.9). Electronically Signed   By: JMarlaine HindM.D.   On: 09/09/2020 17:09   DG Chest Portable 1 View  Result Date: 10/03/2020 CLINICAL DATA:  Cough EXAM: PORTABLE CHEST 1 VIEW COMPARISON:  08/16/2020 FINDINGS: Cardiac shadow is within normal limits. Aortic calcifications are noted. The lungs are clear. Postsurgical changes in the left axilla are noted. No bony abnormality is noted. IMPRESSION: No active disease. Electronically Signed   By: MInez CatalinaM.D.   On: 10/03/2020 23:17    Microbiology: Recent Results (from the past 240 hour(s))  Culture, blood (routine x 2)     Status: None (Preliminary result)   Collection Time: 10/03/20 10:49 PM   Specimen: BLOOD  Result Value Ref Range Status   Specimen Description   Final    BLOOD RIGHT ANTECUBITAL Performed at [facility] [place] [address]   Special Requests   Final    BOTTLES DRAWN AEROBIC AND ANAEROBIC Blood Culture adequate volume Performed at [facility], [place] [address]   Culture   Final    NO GROWTH 1 DAY [facility]formed at [facility] [address], [place] [address]   Report Status PENDING  Incomplete  SARS Coronavirus 2 by RT PCR (hospital order, [facility]) Nasopharyngeal Nasopharyngeal Swab     Status: None   Collection Time: 10/03/20 10:55 PM   Specimen: Nasopharyngeal Swab  Result Value Ref Range Status  SARS Coronavirus 2 NEGATIVE NEGATIVE Final    Comment: (NOTE) SARS-CoV-2 target nucleic acids are NOT DETECTED.  The SARS-CoV-2  RNA is generally detectable in upper and lower respiratory specimens during the acute phase of infection. The lowest concentration of SARS-CoV-2 viral copies this assay can detect is 250 copies / mL. A negative result does not preclude SARS-CoV-2 infection and should not be used as the sole basis for treatment or other patient management decisions.  A negative result may occur with improper specimen collection / handling, submission of specimen other than nasopharyngeal swab, presence of viral mutation(s) within the areas targeted by this assay, and inadequate number of viral copies (<250 copies / mL). A negative result must be combined with clinical observations, patient history, and epidemiological information.  Fact Sheet for Patients:   StrictlyIdeas.no  Fact Sheet for Healthcare Providers: BankingDealers.co.za  This test is not yet approved or  cleared by the [place] FDA and has been authorized for detection and/or diagnosis of SARS-CoV-2 by FDA under an Emergency Use Authorization (EUA).  This EUA will remain in effect (meaning this test can be used) for the duration of the COVID-19 declaration under Section 564(b)(1) of the Act, 21 U.S.C. section 360bbb-3(b)(1), unless the authorization is terminated or revoked sooner.  Performed at [facility], [place] [address], [place], [place] [address]      Labs: Basic Metabolic Panel: Recent Labs  Lab 10/03/20 1305 10/04/20 0503 10/05/20 1302  NA 140 140 144  K 3.8 3.8 3.5  CL 103 106 107  CO2 _0 GLUCOSE 109* 126* 106*  BUN 40* 33* 15  CREATININE 1.37* 0.87 1.01*  CALCIUM 9.3 7.8* 8.2*   Liver Function Tests: Recent Labs  Lab 10/03/20 1305 10/04/20 0503  AST 21 18  ALT 19 15  ALKPHOS 77 61  BILITOT 1.2 0.4  PROT 7.9 6.2*  ALBUMIN 4.1 3.1*   Recent Labs  Lab 10/03/20 1305  LIPASE 30   No results for input(s): AMMONIA in the last 168  hours. CBC: Recent Labs  Lab 09/29/20 0839 10/03/20 1305 10/04/20 0503 10/05/20 1302  WBC 12.2* 6.6 5.0 4.4  NEUTROABS 9.7*  --   --  2.8  HGB 14.3 13.6 11.0* 11.6*  HCT 45.5 42.3 35.2* 36.7  MCV 96.0 92.6 96.7 95.8  PLT 245 240 192 220   Cardiac Enzymes: No results for input(s): CKTOTAL, CKMB, CKMBINDEX, TROPONINI in the last 168 hours. BNP: BNP (last 3 results) No results for input(s): BNP in the last 8760 hours.  ProBNP (last 3 results) No results for input(s): PROBNP in the last 8760 hours.  CBG: No results for input(s): GLUCAP in the last 168 hours.     Signed:  Irine Seal MD.  [facility] 10/05/2020, 3:20 PM

## 2020-10-05 NOTE — Progress Notes (Addendum)
Physical Therapy Treatment Patient Details Name: ELEXA KIVI MRN: [id number] DOB: [birth year] Today's Date: 10/05/2020    History of Present Illness 80 yo female admitted with sepsis. Hx of renal cell ca, lung ca    PT Comments    Progressing with mobility. Pt declines f/u PT after discharge.    Follow Up Recommendations  No PT follow up;Supervision - Intermittent (pt declines f/u PT --"I think its a crock...")     Equipment Recommendations  None recommended by PT    Recommendations for Other Services       Precautions / Restrictions Precautions Precautions: Fall Restrictions Weight Bearing Restrictions: No    Mobility  Bed Mobility Overal bed mobility: Modified Independent                Transfers Overall transfer level: Modified independent                  Ambulation/Gait Ambulation/Gait assistance: Min guard Gait Distance (Feet): 150 Feet Assistive device: None Gait Pattern/deviations: Step-through pattern;Decreased stride length     General Gait Details: mildly unsteady at times but no overt LOB. Pt intermittently reached out for handrail to steady herself. Less wheezing on today. Fair gait speed.   Stairs             Wheelchair Mobility    Modified Rankin (Stroke Patients Only)       Balance Overall balance assessment: Mild deficits observed, not formally tested                                          Cognition Arousal/Alertness: Awake/alert Behavior During Therapy: WFL for tasks assessed/performed Overall Cognitive Status: Within Functional Limits for tasks assessed                                        Exercises      General Comments        Pertinent Vitals/Pain Pain Assessment: No/denies pain    Home Living                      Prior Function            PT Goals (current goals can now be found in the care plan section) Progress towards PT goals: Progressing  toward goals    Frequency    Min 3X/week      PT Plan Current plan remains appropriate    Co-evaluation              AM-PAC PT "6 Clicks" Mobility   Outcome Measure  Help needed turning from your back to your side while in a flat bed without using bedrails?: None Help needed moving from lying on your back to sitting on the side of a flat bed without using bedrails?: None Help needed moving to and from a bed to a chair (including a wheelchair)?: A Little Help needed standing up from a chair using your arms (e.g., wheelchair or bedside chair)?: A Little Help needed to walk in hospital room?: A Little Help needed climbing 3-5 steps with a railing? : A Little 6 Click Score: 20    End of Session Equipment Utilized During Treatment: Gait belt Activity Tolerance: Patient tolerated treatment well Patient left: in bed;with call bell/phone within reach  PT Visit Diagnosis: Unsteadiness on feet (R26.81)     Time: 0300-9233 PT Time Calculation (min) (ACUTE ONLY): 8 min  Charges:  $Gait Training: 8-22 mins                        Doreatha Massed, PT Acute Rehabilitation  Office: [phone] Pager: [phone]

## 2020-10-05 NOTE — Progress Notes (Incomplete)
PROGRESS NOTE    [name]  RCB:[id number] DOB: 01/10/1941 DOA: 10/03/2020 PCP: Alanson Puls, [facility] (Confirm with patient/family/NH records and if not entered, this HAS to be entered at [facility] point of entry. "No PCP" if truly none.)   Chief Complaint  Patient presents with  . Abdominal Pain    Brief Narrative: (Start on day 1 of progress note - keep it brief and live) ***   Assessment & Plan:   Principal Problem:   Pyelonephritis Active Problems:   Adenocarcinoma of left breast   Clear cell renal cell carcinoma, left (HCC)   Lung cancer (HCC)   Iron deficiency anemia   Hypothyroidism   ***   DVT prophylaxis: (Lovenox/Heparin/SCD's/anticoagulated/None (if comfort care) Code Status: (Full/Partial - specify details) Family Communication: (Specify name, relationship & date discussed. NO "discussed with patient") Disposition:   Status is: Observation  {Observation:23811}  Dispo: The patient is from: {From:23814}              Anticipated d/c is to: {To:23815}              Anticipated d/c date is: {Days:23816}              Patient currently {Medically stable:23817}   Difficult to place patient {Yes/No:25151}       Consultants:   ***  Procedures: (Don't include imaging studies which can be auto populated. Include things that cannot be auto populated i.e. Echo, Carotid and venous dopplers, Foley, Bipap, HD, tubes/drains, wound vac, central lines etc)  ***  Antimicrobials: (specify start and planned stop date. Auto populated tables are space occupying and do not give end dates)  ***    Subjective: Patient feels better.  Denies any chest pain or shortness of breath.  No abdominal pain.  Denies any right-sided flank pain.  Tolerating current diet.  Daughter at bedside  Objective: Vitals:   10/04/20 2033 10/05/20 0102 10/05/20 0637 10/05/20 0800  BP: (!) 113/46 136/72 129/70 124/68  Pulse: 80 80  80  Resp: 18 18  18   Temp: 98.7 F (37.1 C) 98.4 F  (36.9 C) 98.2 F (36.8 C) 98 F (36.7 C)  TempSrc: Oral Oral Oral Oral  SpO2: 93% 92% 93% 94%  Weight:      Height:        Intake/Output Summary (Last 24 hours) at 10/05/2020 1145 Last data filed at 10/05/2020 0843 Gross per 24 hour  Intake 2982.55 ml  Output 1054 ml  Net 1928.55 ml   Filed Weights   10/04/20 0337  Weight: 66.7 kg    Examination:  General exam: Appears calm and comfortable  Respiratory system: Clear to auscultation. Respiratory effort normal. Cardiovascular system: S1 & S2 heard, RRR. No JVD, murmurs, rubs, gallops or clicks. No pedal edema. Gastrointestinal system: Abdomen is nondistended, soft and nontender. No organomegaly or masses felt. Normal bowel sounds heard. Central nervous system: Alert and oriented. No focal neurological deficits. Extremities: Symmetric 5 x 5 power. Skin: No rashes, lesions or ulcers Psychiatry: Judgement and insight appear normal. Mood & affect appropriate.     Data Reviewed: I have personally reviewed following labs and imaging studies  CBC: Recent Labs  Lab 09/29/20 0839 10/03/20 1305 10/04/20 0503  WBC 12.2* 6.6 5.0  NEUTROABS 9.7*  --   --   HGB 14.3 13.6 11.0*  HCT 45.5 42.3 35.2*  MCV 96.0 92.6 96.7  PLT 245 240 803    Basic Metabolic Panel: Recent Labs  Lab 10/03/20 1305 10/04/20  0503  NA 140 140  K 3.8 3.8  CL 103 106  CO2 23 25  GLUCOSE 109* 126*  BUN 40* 33*  CREATININE 1.37* 0.87  CALCIUM 9.3 7.8*    GFR: Estimated Creatinine Clearance: 48 mL/min (by C-G formula based on SCr of 0.87 mg/dL).  Liver Function Tests: Recent Labs  Lab 10/03/20 1305 10/04/20 0503  AST 21 18  ALT 19 15  ALKPHOS 77 61  BILITOT 1.2 0.4  PROT 7.9 6.2*  ALBUMIN 4.1 3.1*    CBG: No results for input(s): GLUCAP in the last 168 hours.   Recent Results (from the past 240 hour(s))  Culture, blood (routine x 2)     Status: None (Preliminary result)   Collection Time: 10/03/20 10:49 PM   Specimen: BLOOD   Result Value Ref Range Status   Specimen Description   Final    BLOOD RIGHT ANTECUBITAL Performed at [facility] [address], [place], [place] [address]    Special Requests   Final    BOTTLES DRAWN AEROBIC AND ANAEROBIC Blood Culture adequate volume Performed at [facility] [address] [place], [place] [address]    Culture   Final    NO GROWTH 1 DAY Performed at [facility], [place] [address] [place], [place] [address]    Report Status PENDING  Incomplete  SARS Coronavirus 2 by RT PCR (hospital order, performed in [facility] hospital lab) Nasopharyngeal Nasopharyngeal Swab     Status: None   Collection Time: 10/03/20 10:55 PM   Specimen: Nasopharyngeal Swab  Result Value Ref Range Status   SARS Coronavirus 2 NEGATIVE NEGATIVE Final    Comment: (NOTE) SARS-CoV-2 target nucleic acids are NOT DETECTED.  The SARS-CoV-2 RNA is generally detectable in upper and lower respiratory specimens during the acute phase of infection. The lowest concentration of SARS-CoV-2 viral copies this assay can detect is 250 copies / mL. A negative result does not preclude SARS-CoV-2 infection and should not be used as the sole basis for treatment or other patient management decisions.  A negative result may occur with improper specimen collection / handling, submission of specimen other than nasopharyngeal swab, presence of viral mutation(s) within the areas targeted by this assay, and inadequate number of viral copies (<250 copies / mL). A negative result must be combined with clinical observations, patient history, and epidemiological information.  Fact Sheet for Patients:   StrictlyIdeas.no  Fact Sheet for Healthcare Providers: BankingDealers.co.za  This test is not yet approved or  cleared by the [place] FDA and has been authorized for detection and/or diagnosis of SARS-CoV-2 by FDA under  an Emergency Use Authorization (EUA).  This EUA will remain in effect (meaning this test can be used) for the duration of the COVID-19 declaration under Section 564(b)(1) of the Act, 21 U.S.C. section 360bbb-3(b)(1), unless the authorization is terminated or revoked sooner.  Performed at [facility], [place] [address] [place], [place] [address]          Radiology Studies: CT ABDOMEN PELVIS WO CONTRAST  Result Date: 10/03/2020 CLINICAL DATA:  Abdominal pain and hematuria. History of kidney cancer with nephrectomy. Right-sided abdominal pain. EXAM: CT ABDOMEN AND PELVIS WITHOUT CONTRAST TECHNIQUE: Multidetector CT imaging of the abdomen and pelvis was performed following the standard protocol without IV contrast. COMPARISON:  September 09, 2020 FINDINGS: Lower chest: The lung bases are clear. The heart size is normal. Hepatobiliary: There is decreased hepatic attenuation suggestive of hepatic  steatosis. Cholelithiasis without acute inflammation.There is no biliary ductal dilation. Pancreas: Normal contours without ductal dilatation. No peripancreatic fluid collection. Spleen: Unremarkable. Adrenals/Urinary Tract: --Adrenal glands: There is persistent left adrenal thickening which is essentially stable from prior study. --Right kidney/ureter: There is mild right-sided collecting system dilatation without evidence for an obstructing stone. There is wall thickening of the right ureter. --Left kidney/ureter: The patient is status post prior left-sided nephrectomy. --Urinary bladder: There is mild bladder wall thickening. Stomach/Bowel: --Stomach/Duodenum: No hiatal hernia or other gastric abnormality. Normal duodenal course and caliber. --Small bowel: Unremarkable. --Colon: Unremarkable. --Appendix: Normal. Vascular/Lymphatic: Atherosclerotic calcification is present within the non-aneurysmal abdominal aorta, without hemodynamically significant stenosis. --No retroperitoneal  lymphadenopathy. --No mesenteric lymphadenopathy. --No pelvic or inguinal lymphadenopathy. Reproductive: Unremarkable Other: No ascites or free air. There is a fat containing left inguinal hernia. Musculoskeletal. No acute displaced fractures. IMPRESSION: 1. Mild right-sided collecting system dilatation without evidence for an obstructing stone. There is wall thickening of the right ureter. Findings may be secondary to a recently passed stone or ascending urinary tract infection. 2. Mild bladder wall thickening. Correlate with urinalysis to exclude cystitis. 3. Hepatic steatosis. 4. Cholelithiasis without acute inflammation. 5. Fat containing left inguinal hernia. Aortic Atherosclerosis (ICD10-I70.0). Electronically Signed   By: Constance Holster M.D.   On: 10/03/2020 21:48   DG Chest Portable 1 View  Result Date: 10/03/2020 CLINICAL DATA:  Cough EXAM: PORTABLE CHEST 1 VIEW COMPARISON:  08/16/2020 FINDINGS: Cardiac shadow is within normal limits. Aortic calcifications are noted. The lungs are clear. Postsurgical changes in the left axilla are noted. No bony abnormality is noted. IMPRESSION: No active disease. Electronically Signed   By: Inez Catalina M.D.   On: 10/03/2020 23:17        Scheduled Meds: . enoxaparin (LOVENOX) injection  40 mg Subcutaneous Q24H  . ferrous sulfate  325 mg Oral Q breakfast  . icosapent Ethyl  2 g Oral BID  . levothyroxine  100 mcg Oral QAC breakfast  . simvastatin  40 mg Oral q1800   Continuous Infusions: . cefTRIAXone (ROCEPHIN)  IV 2 g (10/04/20 2217)     LOS: 0 days    Time spent: ***    Irine Seal, MD [facility]   To contact the attending provider between 7A-7P or the covering provider during after hours 7P-7A, please log into the web site [email or web address] and access using universal [place] password for that web site. If you do not have the password, please call the hospital operator.  10/05/2020, 11:45 AM

## 2020-10-06 LAB — URINE CULTURE: Culture: <10000 — AB

## 2020-10-09 LAB — CULTURE, BLOOD (ROUTINE X 2)
Culture: NO GROWTH
Special Requests: ADEQUATE

## 2020-10-10 ENCOUNTER — Inpatient Hospital Stay (HOSPITAL_COMMUNITY): Payer: Medicare PPO | Attending: Hematology | Admitting: Hematology

## 2020-10-10 ENCOUNTER — Other Ambulatory Visit: Payer: Self-pay

## 2020-10-10 VITALS — BP 157/74 | HR 91 | Temp 97.8°F | Resp 18 | Wt 153.1 lb

## 2020-10-10 DIAGNOSIS — C3492 Malignant neoplasm of unspecified part of left bronchus or lung: Secondary | ICD-10-CM

## 2020-10-10 DIAGNOSIS — C3412 Malignant neoplasm of upper lobe, left bronchus or lung: Secondary | ICD-10-CM | POA: Diagnosis present

## 2020-10-10 DIAGNOSIS — Z902 Acquired absence of lung [part of]: Secondary | ICD-10-CM | POA: Diagnosis not present

## 2020-10-10 DIAGNOSIS — Z800 Family history of malignant neoplasm of digestive organs: Secondary | ICD-10-CM | POA: Insufficient documentation

## 2020-10-10 DIAGNOSIS — Z87891 Personal history of nicotine dependence: Secondary | ICD-10-CM | POA: Insufficient documentation

## 2020-10-10 DIAGNOSIS — D649 Anemia, unspecified: Secondary | ICD-10-CM | POA: Diagnosis not present

## 2020-10-10 DIAGNOSIS — Z801 Family history of malignant neoplasm of trachea, bronchus and lung: Secondary | ICD-10-CM | POA: Diagnosis not present

## 2020-10-10 DIAGNOSIS — C642 Malignant neoplasm of left kidney, except renal pelvis: Secondary | ICD-10-CM | POA: Insufficient documentation

## 2020-10-10 NOTE — Progress Notes (Signed)
[place], [place] [address]   CLINIC:  Medical Oncology/Hematology  PCP:  Alanson Puls, [facility]. / [place] [address] [phone]   REASON FOR VISIT:  Follow-up for squamous cell carcinoma of left lung  PRIOR THERAPY:  1. Left upper lobe lobectomy on 09/11/2019. 2. Intermittent Feraheme last on 04/01/2020.  NGS Results: Not done  CURRENT THERAPY: Iron tablets daily  BRIEF ONCOLOGIC HISTORY:  Oncology History  Clear cell renal cell carcinoma, left ([place])  04/13/2019 Initial Diagnosis   Clear cell renal cell carcinoma, left ([place])   04/13/2019 Cancer Staging   Staging form: Kidney, AJCC 8th Edition - Clinical: Stage III (cT3a, cNX, cM0) - Signed by Derek Jack, MD on 04/13/2019   Lung cancer ([place])  09/11/2019 Initial Diagnosis   Lung cancer ([place])   10/26/2019 Cancer Staging   Staging form: Lung, AJCC 8th Edition - Clinical stage from 10/26/2019: Stage IIB (cT1b, cN1, cM0) - Signed by Derek Jack, MD on 10/26/2019     CANCER STAGING: Cancer Staging Adenocarcinoma of left breast Staging form: Breast, AJCC 7th Edition - Clinical: Stage IIA (T2, N0, cM0) - Signed by Baird Cancer, PA on 04/16/2012  Clear cell renal cell carcinoma, left (HCC) Staging form: Kidney, AJCC 8th Edition - Clinical: Stage III (cT3a, cNX, cM0) - Signed by Derek Jack, MD on 04/13/2019  Lung cancer [facility]) Staging form: Lung, AJCC 8th Edition - Clinical stage from 10/26/2019: Stage IIB (cT1b, cN1, cM0) - Signed by Derek Jack, MD on 10/26/2019   INTERVAL HISTORY:  [name], a 80 y.o. female, returns for routine follow-up of her squamous cell carcinoma of left lung. [name] was last seen on 06/28/2020.   Today she reports feeling okay. She complains of having occasional pain in her epigastric area and diarrhea, but denies having any other issues. Her appetite is excellent. She is currently taking  Keflex for her kidney infection for which she was treated in [place] from 01/31 to 02/02. She is taking an iron tablet daily and denies having constipation.   REVIEW OF SYSTEMS:  Review of Systems  Constitutional: Positive for fatigue (75%). Negative for appetite change.  Gastrointestinal: Positive for abdominal pain (5/10 abdominal pain) and diarrhea. Negative for constipation.  All other systems reviewed and are negative.   PAST MEDICAL/SURGICAL HISTORY:  Past Medical History:  Diagnosis Date  . Adenocarcinoma of left breast 04/16/2012   Stage II (T2 N0 M0) left-sided adenocarcinoma breast diagnosed in 1990 by Dr. Romona Curls and treated by Dr. Juanita Craver on NSAB PPD-19 with CMF followed by 5 years of tamoxifen and she remains disease free thus far.   . Breast cancer ([place])    left breast/mastectomy/dx approx1990  . Gall bladder stones   . Grave's disease   . High triglycerides   . History of kidney stones    small stone  rt   . Nerve compression    pinched in neck  . Pneumonia 1990  . PONV (postoperative nausea and vomiting)    Past Surgical History:  Procedure Laterality Date  . CARPAL TUNNEL RELEASE     rt. hand  . cataract surgery     bilateral  . COLONOSCOPY    . EYE SURGERY    . LAPAROSCOPIC NEPHRECTOMY Left 03/25/2019   Procedure: LAPAROSCOPIC RADICAL NEPHRECTOMY;  Surgeon: Ceasar Mons, MD;  Location: [facility];  Service: Urology;  Laterality: Left;  Marland Kitchen MASTECTOMY  left.  . NODE DISSECTION Left 09/11/2019   Procedure: Node Dissection;  Surgeon: [name], MD;  Location: [place];  Service: Thoracic;  Laterality: Left;  . THORACOTOMY/LOBECTOMY Left 09/11/2019   Procedure: Thoracotomy/Lobectomy Left Upper Lobe;  Surgeon: [name], MD;  Location: [place];  Service: Thoracic;  Laterality: Left;  . TONSILLECTOMY      SOCIAL HISTORY:  Social History   Socioeconomic History  . Marital status: Divorced    Spouse name: Not on file  .  Number of children: Not on file  . Years of education: Not on file  . Highest education level: Not on file  Occupational History  . Not on file  Tobacco Use  . Smoking status: Former Smoker    Packs/day: 1.00    Years: 60.00    Pack years: 60.00    Types: Cigarettes    Quit date: 09/22/2019    Years since quitting: 1.0  . Smokeless tobacco: Never Used  . Tobacco comment: attempted to stop but its expensive to quit  Vaping Use  . Vaping Use: Never used  Substance and Sexual Activity  . Alcohol use: No  . Drug use: No  . Sexual activity: Not on file  Other Topics Concern  . Not on file  Social History Narrative  . Not on file   Social Determinants of Health   Financial Resource Strain: Not on file  Food Insecurity: Not on file  Transportation Needs: Not on file  Physical Activity: Not on file  Stress: Not on file  Social Connections: Not on file  Intimate Partner Violence: Not At Risk  . Fear of Current or Ex-Partner: No  . Emotionally Abused: No  . Physically Abused: No  . Sexually Abused: No    FAMILY HISTORY:  Family History  Problem Relation Age of Onset  . Cancer Father        lung cancer  . Cancer Brother        colon cancer  . Cancer Other        lung cancer/died age 82    CURRENT MEDICATIONS:  Current Outpatient Medications  Medication Sig Dispense Refill  . acetaminophen (TYLENOL) 500 MG tablet Take 500 mg by mouth every 6 (six) hours as needed for mild pain, moderate pain or headache.     . Calcium-Vitamin D ([employer] CALCIUM-600/VIT D PO) Take 1 capsule by mouth daily.    . cephALEXin (KEFLEX) 500 MG capsule Take 1 capsule (500 mg total) by mouth 3 (three) times daily for 8 days. 24 capsule 0  . ferrous sulfate 325 (65 FE) MG EC tablet Take 325 mg by mouth daily.    . hydroxypropyl methylcellulose / hypromellose (ISOPTO TEARS / GONIOVISC) 2.5 % ophthalmic solution Place 1 drop into both eyes 3 (three) times daily as needed for dry eyes.     Marland Kitchen  levothyroxine (SYNTHROID, LEVOTHROID) 100 MCG tablet Take 100 mcg by mouth daily before breakfast.     . meclizine (ANTIVERT) 25 MG tablet Take 25 mg by mouth daily as needed for dizziness or nausea.    . Multiple Vitamins-Minerals (CENTRUM SILVER ULTRA WOMENS) TABS Take 1 tablet by mouth daily.    . simvastatin (ZOCOR) 40 MG tablet Take 40 mg by mouth daily at 6 PM.     . VASCEPA 1 g capsule Take 2 g by mouth 2 (two) times daily.     No current facility-administered medications for this visit.    ALLERGIES:  Allergies  Allergen Reactions  . Chantix [Varenicline]     depression    PHYSICAL EXAM:  Performance status (ECOG): 1 - Symptomatic but completely ambulatory  Vitals:   10/10/20 1426  BP: (!) 157/74  Pulse: 91  Resp: 18  Temp: 97.8 F (36.6 C)  SpO2: 96%   Wt Readings from Last 3 Encounters:  10/10/20 153 lb 1 oz (69.4 kg)  10/04/20 147 lb 0.8 oz (66.7 kg)  08/16/20 147 lb (66.7 kg)   Physical Exam   LABORATORY DATA:  I have reviewed the labs as listed.  CBC Latest Ref Rng & Units 10/05/2020 10/04/2020 10/03/2020  WBC 4.0 - 10.5 K/uL 4.4 5.0 6.6  Hemoglobin 12.0 - 15.0 g/dL 11.6(L) 11.0(L) 13.6  Hematocrit 36.0 - 46.0 % 36.7 35.2(L) 42.3  Platelets 150 - 400 K/uL 220 192 240   CMP Latest Ref Rng & Units 10/05/2020 10/04/2020 10/03/2020  Glucose 70 - 99 mg/dL 106(H) 126(H) 109(H)  BUN 8 - 23 mg/dL 15 33(H) 40(H)  Creatinine 0.44 - 1.00 mg/dL 1.01(H) 0.87 1.37(H)  Sodium 135 - 145 mmol/L 144 140 140  Potassium 3.5 - 5.1 mmol/L 3.5 3.8 3.8  Chloride 98 - 111 mmol/L 107 106 103  CO2 22 - 32 mmol/L 26 25 23   Calcium 8.9 - 10.3 mg/dL 8.2(L) 7.8(L) 9.3  Total Protein 6.5 - 8.1 g/dL - 6.2(L) 7.9  Total Bilirubin 0.3 - 1.2 mg/dL - 0.4 1.2  Alkaline Phos 38 - 126 U/L - 61 77  AST 15 - 41 U/L - 18 21  ALT 0 - 44 U/L - 15 19    DIAGNOSTIC IMAGING:  I have independently reviewed the scans and discussed with the patient. CT ABDOMEN PELVIS WO CONTRAST  Result Date:  10/03/2020 CLINICAL DATA:  Abdominal pain and hematuria. History of kidney cancer with nephrectomy. Right-sided abdominal pain. EXAM: CT ABDOMEN AND PELVIS WITHOUT CONTRAST TECHNIQUE: Multidetector CT imaging of the abdomen and pelvis was performed following the standard protocol without IV contrast. COMPARISON:  September 09, 2020 FINDINGS: Lower chest: The lung bases are clear. The heart size is normal. Hepatobiliary: There is decreased hepatic attenuation suggestive of hepatic steatosis. Cholelithiasis without acute inflammation.There is no biliary ductal dilation. Pancreas: Normal contours without ductal dilatation. No peripancreatic fluid collection. Spleen: Unremarkable. Adrenals/Urinary Tract: --Adrenal glands: There is persistent left adrenal thickening which is essentially stable from prior study. --Right kidney/ureter: There is mild right-sided collecting system dilatation without evidence for an obstructing stone. There is wall thickening of the right ureter. --Left kidney/ureter: The patient is status post prior left-sided nephrectomy. --Urinary bladder: There is mild bladder wall thickening. Stomach/Bowel: --Stomach/Duodenum: No hiatal hernia or other gastric abnormality. Normal duodenal course and caliber. --Small bowel: Unremarkable. --Colon: Unremarkable. --Appendix: Normal. Vascular/Lymphatic: Atherosclerotic calcification is present within the non-aneurysmal abdominal aorta, without hemodynamically significant stenosis. --No retroperitoneal lymphadenopathy. --No mesenteric lymphadenopathy. --No pelvic or inguinal lymphadenopathy. Reproductive: Unremarkable Other: No ascites or free air. There is a fat containing left inguinal hernia. Musculoskeletal. No acute displaced fractures. IMPRESSION: 1. Mild right-sided collecting system dilatation without evidence for an obstructing stone. There is wall thickening of the right ureter. Findings may be secondary to a recently passed stone or ascending urinary  tract infection. 2. Mild bladder wall thickening. Correlate with urinalysis to exclude cystitis. 3. Hepatic steatosis. 4. Cholelithiasis without acute inflammation. 5. Fat containing left inguinal hernia. Aortic Atherosclerosis (ICD10-I70.0). Electronically Signed   By: Constance Holster M.D.   On: 10/03/2020 21:48   DG Chest Portable 1  View  Result Date: 10/03/2020 CLINICAL DATA:  Cough EXAM: PORTABLE CHEST 1 VIEW COMPARISON:  08/16/2020 FINDINGS: Cardiac shadow is within normal limits. Aortic calcifications are noted. The lungs are clear. Postsurgical changes in the left axilla are noted. No bony abnormality is noted. IMPRESSION: No active disease. Electronically Signed   By: Inez Catalina M.D.   On: 10/03/2020 23:17     ASSESSMENT:  1. 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 -Brain MRI on 12/07/2019 with no metastatic disease. -Adjuvant chemotherapy was not offered based on poor performance status and weight loss. -CT chest on 01/08/2020 did not show any evidence of local recurrence left lung. Trace loculated medial left pleural effusion. No evidence of metastatic disease in the chest. Tiny medial right upper lobe 3 mm solid pulmonary nodule decreased in the interval, presumably benign. -CT chest with contrast on 06/21/2020 with no evidence of recurrence or metastatic disease.  3 mm nodule in the medial right chest is unchanged.  2. Normocytic anemia: -Feraheme on 03/25/2020 and 04/01/2020.   PLAN:  1. Stage IIb (T1BN1) squamous cell carcinoma of the left upper lobe of the lung: -Reviewed CT chest with contrast from 09/09/2020 which showed stable postoperative changes from the left upper lobectomy with no evidence of metastatic disease in the chest, abdomen and pelvis. -We will arrange for another scan in 6 months.  2.  Normocytic anemia: -Iron panel from 09/29/2020 shows ferritin 26.  Latest hemoglobin is  11.6. -Last Feraheme was on 04/01/2020.  No indication for parenteral iron therapy at this time. -Continue iron tablet daily.  RTC 2 months with repeat labs.    Orders placed this encounter:  No orders of the defined types were placed in this encounter.    Derek Jack, MD [place] [phone]   I, Milinda Antis, am acting as a scribe for Dr. Sanda Linger.  I, Derek Jack MD, have reviewed the above documentation for accuracy and completeness, and I agree with the above.

## 2020-10-10 NOTE — Patient Instructions (Signed)
[place] at [facility] Discharge Instructions  You were seen today by Dr. Delton Coombes. He went over your recent results. Finish your course of antibiotics. Dr. Delton Coombes will see you back in 2 months for labs and follow up.   Thank you for choosing [place] at [facility] to provide your oncology and hematology care.  To afford each patient quality time with our provider, please arrive at least 15 minutes before your scheduled appointment time.   If you have a lab appointment with the [facility] please come in thru the Main Entrance and check in at the main information desk  You need to re-schedule your appointment should you arrive 10 or more minutes late.  We strive to give you quality time with our providers, and arriving late affects you and other patients whose appointments are after yours.  Also, if you no show three or more times for appointments you may be dismissed from the clinic at the providers discretion.     Again, thank you for choosing [facility].  Our hope is that these requests will decrease the amount of time that you wait before being seen by our physicians.       _____________________________________________________________  Should you have questions after your visit to [facility], please contact our office at [phone] between the hours of 8:00 a.m. and 4:30 p.m.  Voicemails left after 4:00 p.m. will not be returned until the following business day.  For prescription refill requests, have your pharmacy contact our office and allow 72 hours.    Cancer Center Support Programs:   > Cancer Support Group  2nd Tuesday of the month 1pm-2pm, Journey Room

## 2020-10-12 ENCOUNTER — Encounter (HOSPITAL_COMMUNITY): Payer: Self-pay

## 2020-11-28 ENCOUNTER — Other Ambulatory Visit (HOSPITAL_COMMUNITY): Payer: Medicare PPO

## 2020-11-28 ENCOUNTER — Other Ambulatory Visit: Payer: Self-pay

## 2020-11-28 ENCOUNTER — Inpatient Hospital Stay (HOSPITAL_COMMUNITY): Payer: Medicare PPO | Attending: Hematology

## 2020-11-28 DIAGNOSIS — Z85118 Personal history of other malignant neoplasm of bronchus and lung: Secondary | ICD-10-CM | POA: Insufficient documentation

## 2020-11-28 DIAGNOSIS — Z85528 Personal history of other malignant neoplasm of kidney: Secondary | ICD-10-CM | POA: Insufficient documentation

## 2020-11-28 DIAGNOSIS — Z800 Family history of malignant neoplasm of digestive organs: Secondary | ICD-10-CM | POA: Diagnosis not present

## 2020-11-28 DIAGNOSIS — Z87891 Personal history of nicotine dependence: Secondary | ICD-10-CM | POA: Insufficient documentation

## 2020-11-28 DIAGNOSIS — Z902 Acquired absence of lung [part of]: Secondary | ICD-10-CM | POA: Diagnosis not present

## 2020-11-28 DIAGNOSIS — Z853 Personal history of malignant neoplasm of breast: Secondary | ICD-10-CM | POA: Insufficient documentation

## 2020-11-28 DIAGNOSIS — Z79899 Other long term (current) drug therapy: Secondary | ICD-10-CM | POA: Insufficient documentation

## 2020-11-28 DIAGNOSIS — D509 Iron deficiency anemia, unspecified: Secondary | ICD-10-CM | POA: Diagnosis not present

## 2020-11-28 DIAGNOSIS — Z9012 Acquired absence of left breast and nipple: Secondary | ICD-10-CM | POA: Insufficient documentation

## 2020-11-28 DIAGNOSIS — Z801 Family history of malignant neoplasm of trachea, bronchus and lung: Secondary | ICD-10-CM | POA: Diagnosis not present

## 2020-11-28 DIAGNOSIS — E0500 Thyrotoxicosis with diffuse goiter without thyrotoxic crisis or storm: Secondary | ICD-10-CM | POA: Insufficient documentation

## 2020-11-28 DIAGNOSIS — C3492 Malignant neoplasm of unspecified part of left bronchus or lung: Secondary | ICD-10-CM

## 2020-11-28 LAB — CBC WITH DIFFERENTIAL/PLATELET
Abs Immature Granulocytes: 0.03 10*3/uL (ref 0.00–0.07)
Basophils Absolute: 0.1 10*3/uL (ref 0.0–0.1)
Basophils Relative: 1 %
Eosinophils Absolute: 0.3 10*3/uL (ref 0.0–0.5)
Eosinophils Relative: 4 %
HCT: 45.5 % (ref 36.0–46.0)
Hemoglobin: 14.5 g/dL (ref 12.0–15.0)
Immature Granulocytes: 0 %
Lymphocytes Relative: 23 %
Lymphs Abs: 1.5 10*3/uL (ref 0.7–4.0)
MCH: 30.9 pg (ref 26.0–34.0)
MCHC: 31.9 g/dL (ref 30.0–36.0)
MCV: 97 fL (ref 80.0–100.0)
Monocytes Absolute: 0.4 10*3/uL (ref 0.1–1.0)
Monocytes Relative: 6 %
Neutro Abs: 4.4 10*3/uL (ref 1.7–7.7)
Neutrophils Relative %: 66 %
Platelets: 263 10*3/uL (ref 150–400)
RBC: 4.69 MIL/uL (ref 3.87–5.11)
RDW: 13.4 % (ref 11.5–15.5)
WBC: 6.7 10*3/uL (ref 4.0–10.5)
nRBC: 0 % (ref 0.0–0.2)

## 2020-11-28 LAB — COMPREHENSIVE METABOLIC PANEL
ALT: 22 U/L (ref 0–44)
AST: 25 U/L (ref 15–41)
Albumin: 4.7 g/dL (ref 3.5–5.0)
Alkaline Phosphatase: 58 U/L (ref 38–126)
Anion gap: 10 (ref 5–15)
BUN: 27 mg/dL — ABNORMAL HIGH (ref 8–23)
CO2: 26 mmol/L (ref 22–32)
Calcium: 9.4 mg/dL (ref 8.9–10.3)
Chloride: 104 mmol/L (ref 98–111)
Creatinine, Ser: 1.16 mg/dL — ABNORMAL HIGH (ref 0.44–1.00)
GFR, Estimated: 48 mL/min — ABNORMAL LOW (ref >60)
Glucose, Bld: 101 mg/dL — ABNORMAL HIGH (ref 70–99)
Potassium: 4.3 mmol/L (ref 3.5–5.1)
Sodium: 140 mmol/L (ref 135–145)
Total Bilirubin: 0.7 mg/dL (ref 0.3–1.2)
Total Protein: 8.2 g/dL — ABNORMAL HIGH (ref 6.5–8.1)

## 2020-11-28 LAB — FERRITIN: Ferritin: 38 ng/mL (ref 11–307)

## 2020-11-28 LAB — IRON AND TIBC
Iron: 109 ug/dL (ref 28–170)
Saturation Ratios: 29 % (ref 10.4–31.8)
TIBC: 378 ug/dL (ref 250–450)
UIBC: 269 ug/dL

## 2020-12-05 ENCOUNTER — Inpatient Hospital Stay (HOSPITAL_COMMUNITY): Payer: Medicare PPO | Attending: Hematology | Admitting: Hematology

## 2020-12-05 ENCOUNTER — Other Ambulatory Visit: Payer: Self-pay

## 2020-12-05 ENCOUNTER — Encounter (HOSPITAL_COMMUNITY): Payer: Self-pay | Admitting: Hematology

## 2020-12-05 VITALS — BP 131/73 | HR 100 | Temp 97.0°F | Resp 17 | Wt 152.9 lb

## 2020-12-05 DIAGNOSIS — E0500 Thyrotoxicosis with diffuse goiter without thyrotoxic crisis or storm: Secondary | ICD-10-CM | POA: Diagnosis not present

## 2020-12-05 DIAGNOSIS — C642 Malignant neoplasm of left kidney, except renal pelvis: Secondary | ICD-10-CM | POA: Diagnosis not present

## 2020-12-05 DIAGNOSIS — Z905 Acquired absence of kidney: Secondary | ICD-10-CM | POA: Diagnosis not present

## 2020-12-05 DIAGNOSIS — Z801 Family history of malignant neoplasm of trachea, bronchus and lung: Secondary | ICD-10-CM | POA: Diagnosis not present

## 2020-12-05 DIAGNOSIS — Z853 Personal history of malignant neoplasm of breast: Secondary | ICD-10-CM | POA: Diagnosis not present

## 2020-12-05 DIAGNOSIS — Z902 Acquired absence of lung [part of]: Secondary | ICD-10-CM | POA: Insufficient documentation

## 2020-12-05 DIAGNOSIS — C3492 Malignant neoplasm of unspecified part of left bronchus or lung: Secondary | ICD-10-CM

## 2020-12-05 DIAGNOSIS — Z79899 Other long term (current) drug therapy: Secondary | ICD-10-CM | POA: Diagnosis not present

## 2020-12-05 DIAGNOSIS — Z87891 Personal history of nicotine dependence: Secondary | ICD-10-CM | POA: Diagnosis not present

## 2020-12-05 DIAGNOSIS — Z85528 Personal history of other malignant neoplasm of kidney: Secondary | ICD-10-CM | POA: Diagnosis not present

## 2020-12-05 DIAGNOSIS — D509 Iron deficiency anemia, unspecified: Secondary | ICD-10-CM | POA: Diagnosis not present

## 2020-12-05 DIAGNOSIS — Z9012 Acquired absence of left breast and nipple: Secondary | ICD-10-CM | POA: Diagnosis not present

## 2020-12-05 DIAGNOSIS — Z85118 Personal history of other malignant neoplasm of bronchus and lung: Secondary | ICD-10-CM | POA: Diagnosis not present

## 2020-12-05 DIAGNOSIS — Z800 Family history of malignant neoplasm of digestive organs: Secondary | ICD-10-CM | POA: Diagnosis not present

## 2020-12-05 NOTE — Progress Notes (Signed)
[place], [place] [address]   CLINIC:  Medical Oncology/Hematology  PCP:  Alanson Puls, [facility] [place]. / [place] [address] [phone]   REASON FOR VISIT:  Follow-up for squamous cell carcinoma of left lung and IDA  PRIOR THERAPY:  1. Left upper lobe lobectomy on 09/11/2019. 2. Intermittent Feraheme last on 04/01/2020.  NGS Results: Not done  CURRENT THERAPY: Iron tablets daily  BRIEF ONCOLOGIC HISTORY:  Oncology History  Clear cell renal cell carcinoma, left ([place])  04/13/2019 Initial Diagnosis   Clear cell renal cell carcinoma, left ([place])   04/13/2019 Cancer Staging   Staging form: Kidney, AJCC 8th Edition - Clinical: Stage III (cT3a, cNX, cM0) - Signed by Derek Jack, MD on 04/13/2019   Lung cancer ([place])  09/11/2019 Initial Diagnosis   Lung cancer ([place])   10/26/2019 Cancer Staging   Staging form: Lung, AJCC 8th Edition - Clinical stage from 10/26/2019: Stage IIB (cT1b, cN1, cM0) - Signed by Derek Jack, MD on 10/26/2019     CANCER STAGING: Cancer Staging Adenocarcinoma of left breast Staging form: Breast, AJCC 7th Edition - Clinical: Stage IIA (T2, N0, cM0) - Signed by Baird Cancer, PA on 04/16/2012  Clear cell renal cell carcinoma, left (HCC) Staging form: Kidney, AJCC 8th Edition - Clinical: Stage III (cT3a, cNX, cM0) - Signed by Derek Jack, MD on 04/13/2019  Lung cancer [facility]) Staging form: Lung, AJCC 8th Edition - Clinical stage from 10/26/2019: Stage IIB (cT1b, cN1, cM0) - Signed by Derek Jack, MD on 10/26/2019   INTERVAL HISTORY:  [name], a 80 y.o. female, returns for routine follow-up of her squamous cell carcinoma of left lung and IDA. [name] was last seen on 10/10/2020.   Today she is accompanied by her daughter and she reports feeling okay. She is taking iron tablets daily and denies having N/V/D/C. She complains of having intermittent upper  abdominal pain since her lung surgery; she has a history of gallstones.   REVIEW OF SYSTEMS:  Review of Systems  Constitutional: Positive for fatigue (depleted). Negative for appetite change.  Respiratory: Positive for shortness of breath.   Gastrointestinal: Positive for abdominal pain (8/10 intermittent upper abdominal pain). Negative for constipation, diarrhea, nausea and vomiting.  Neurological: Positive for dizziness and headaches.  All other systems reviewed and are negative.   PAST MEDICAL/SURGICAL HISTORY:  Past Medical History:  Diagnosis Date  . Adenocarcinoma of left breast 04/16/2012   Stage II (T2 N0 M0) left-sided adenocarcinoma breast diagnosed in 1990 by Dr. Romona Curls and treated by Dr. Juanita Craver on NSAB PPD-19 with CMF followed by 5 years of tamoxifen and she remains disease free thus far.   . Breast cancer ([place])    left breast/mastectomy/dx approx1990  . Gall bladder stones   . Grave's disease   . High triglycerides   . History of kidney stones    small stone  rt   . Nerve compression    pinched in neck  . Pneumonia 1990  . PONV (postoperative nausea and vomiting)    Past Surgical History:  Procedure Laterality Date  . CARPAL TUNNEL RELEASE     rt. hand  . cataract surgery     bilateral  . COLONOSCOPY    . EYE SURGERY    . LAPAROSCOPIC NEPHRECTOMY Left 03/25/2019   Procedure: LAPAROSCOPIC RADICAL NEPHRECTOMY;  Surgeon: Ceasar Mons, MD;  Location: [facility];  Service: Urology;  Laterality: Left;  Marland Kitchen MASTECTOMY  left.  . NODE DISSECTION Left 09/11/2019   Procedure: Node Dissection;  Surgeon: [name], MD;  Location: [place] [facility];  Service: Thoracic;  Laterality: Left;  . THORACOTOMY/LOBECTOMY Left 09/11/2019   Procedure: Thoracotomy/Lobectomy Left Upper Lobe;  Surgeon: [name], MD;  Location: [facility];  Service: Thoracic;  Laterality: Left;  . TONSILLECTOMY      SOCIAL HISTORY:  Social History   Socioeconomic History  .  Marital status: Divorced    Spouse name: Not on file  . Number of children: Not on file  . Years of education: Not on file  . Highest education level: Not on file  Occupational History  . Not on file  Tobacco Use  . Smoking status: Former Smoker    Packs/day: 1.00    Years: 60.00    Pack years: 60.00    Types: Cigarettes    Quit date: 09/22/2019    Years since quitting: 1.2  . Smokeless tobacco: Never Used  . Tobacco comment: attempted to stop but its expensive to quit  Vaping Use  . Vaping Use: Never used  Substance and Sexual Activity  . Alcohol use: No  . Drug use: No  . Sexual activity: Not on file  Other Topics Concern  . Not on file  Social History Narrative  . Not on file   Social Determinants of Health   Financial Resource Strain: Not on file  Food Insecurity: Not on file  Transportation Needs: Not on file  Physical Activity: Not on file  Stress: Not on file  Social Connections: Not on file  Intimate Partner Violence: Not At Risk  . Fear of Current or Ex-Partner: No  . Emotionally Abused: No  . Physically Abused: No  . Sexually Abused: No    FAMILY HISTORY:  Family History  Problem Relation Age of Onset  . Cancer Father        lung cancer  . Cancer Brother        colon cancer  . Cancer Other        lung cancer/died age 56    CURRENT MEDICATIONS:  Current Outpatient Medications  Medication Sig Dispense Refill  . trimethoprim (TRIMPEX) 100 MG tablet Take 100 mg by mouth daily.    Marland Kitchen acetaminophen (TYLENOL) 500 MG tablet Take 500 mg by mouth every 6 (six) hours as needed for mild pain, moderate pain or headache.     . Calcium-Vitamin D (CVS CALCIUM-600/VIT D PO) Take 1 capsule by mouth daily.    . ferrous sulfate 325 (65 FE) MG EC tablet Take 325 mg by mouth daily.    . hydroxypropyl methylcellulose / hypromellose (ISOPTO TEARS / GONIOVISC) 2.5 % ophthalmic solution Place 1 drop into both eyes 3 (three) times daily as needed for dry eyes.     Marland Kitchen  levothyroxine (SYNTHROID, LEVOTHROID) 100 MCG tablet Take 100 mcg by mouth daily before breakfast.     . meclizine (ANTIVERT) 25 MG tablet Take 25 mg by mouth daily as needed for dizziness or nausea.    . Multiple Vitamins-Minerals (CENTRUM SILVER ULTRA WOMENS) TABS Take 1 tablet by mouth daily.    . simvastatin (ZOCOR) 40 MG tablet Take 40 mg by mouth daily at 6 PM.     . VASCEPA 1 g capsule Take 2 g by mouth 2 (two) times daily.     No current facility-administered medications for this visit.    ALLERGIES:  Allergies  Allergen Reactions  . Chantix [Varenicline]  depression    PHYSICAL EXAM:  Performance status (ECOG): 1 - Symptomatic but completely ambulatory  Vitals:   12/05/20 1135  BP: 131/73  Pulse: 100  Resp: 17  Temp: (!) 97 F (36.1 C)  SpO2: 96%   Wt Readings from Last 3 Encounters:  12/05/20 152 lb 14.4 oz (69.4 kg)  10/10/20 153 lb 1 oz (69.4 kg)  10/04/20 147 lb 0.8 oz (66.7 kg)   Physical Exam Vitals reviewed.  Constitutional:      Appearance: Normal appearance.  Cardiovascular:     Rate and Rhythm: Normal rate and regular rhythm.     Pulses: Normal pulses.     Heart sounds: Normal heart sounds.  Pulmonary:     Effort: Pulmonary effort is normal.     Breath sounds: Normal breath sounds.  Abdominal:     Palpations: Abdomen is soft. There is no mass.     Tenderness: There is no abdominal tenderness.  Neurological:     General: No focal deficit present.     Mental Status: She is alert and oriented to person, place, and time.  Psychiatric:        Mood and Affect: Mood normal.        Behavior: Behavior normal.      LABORATORY DATA:  I have reviewed the labs as listed.  CBC Latest Ref Rng & Units 11/28/2020 10/05/2020 10/04/2020  WBC 4.0 - 10.5 K/uL 6.7 4.4 5.0  Hemoglobin 12.0 - 15.0 g/dL 14.5 11.6(L) 11.0(L)  Hematocrit 36.0 - 46.0 % 45.5 36.7 35.2(L)  Platelets 150 - 400 K/uL 263 220 192   CMP Latest Ref Rng & Units 11/28/2020 10/05/2020 10/04/2020   Glucose 70 - 99 mg/dL 101(H) 106(H) 126(H)  BUN 8 - 23 mg/dL 27(H) 15 33(H)  Creatinine 0.44 - 1.00 mg/dL 1.16(H) 1.01(H) 0.87  Sodium 135 - 145 mmol/L 140 144 140  Potassium 3.5 - 5.1 mmol/L 4.3 3.5 3.8  Chloride 98 - 111 mmol/L 104 107 106  CO2 22 - 32 mmol/L 26 26 25   Calcium 8.9 - 10.3 mg/dL 9.4 8.2(L) 7.8(L)  Total Protein 6.5 - 8.1 g/dL 8.2(H) - 6.2(L)  Total Bilirubin 0.3 - 1.2 mg/dL 0.7 - 0.4  Alkaline Phos 38 - 126 U/L 58 - 61  AST 15 - 41 U/L 25 - 18  ALT 0 - 44 U/L 22 - 15   Lab Results  Component Value Date   TIBC 378 11/28/2020   TIBC 390 09/29/2020   TIBC 488 (H) 06/21/2020   FERRITIN 38 11/28/2020   FERRITIN 26 09/29/2020   FERRITIN 9 (L) 06/21/2020   IRONPCTSAT 29 11/28/2020   IRONPCTSAT 22 09/29/2020   IRONPCTSAT 6 (L) 06/21/2020    DIAGNOSTIC IMAGING:  I have independently reviewed the scans and discussed with the patient. No results found.   ASSESSMENT:  1. 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 -Brain MRI on 12/07/2019 with no metastatic disease. -Adjuvant chemotherapy was not offered based on poor performance status and weight loss. -CT chest on 01/08/2020 did not show any evidence of local recurrence left lung. Trace loculated medial left pleural effusion. No evidence of metastatic disease in the chest. Tiny medial right upper lobe 3 mm solid pulmonary nodule decreased in the interval, presumably benign. -CT chest with contrast on 06/21/2020 with no evidence of recurrence or metastatic disease. 3 mm nodule in the medial right chest is unchanged. -CT chest with contrast  on 09/09/2020 showed stable postoperative changes from left upper lobectomy with no evidence of recurrence or metastasis.  2. Normocytic anemia: -Feraheme on 03/25/2020 and 04/01/2020.  3.  Left kidney clear cell renal cell carcinoma: -Left radical nephrectomy on 03/25/2019.  Pathology shows RCC, conventional  clear cell type, nuclear grade 2 (5.8 cm).  Tumor invades anterior perinephric fat and segmental renal vein (P T3a), ureteral, vascular normal resection margins are negative.   PLAN:  1. Stage IIb (T1BN1) squamous cell carcinoma of the left upper lobe of the lung: -She does not have any clinical signs or symptoms of recurrence. -Last CT scan in January did not show any recurrence. -Recommend follow-up in 4 months with repeat CT chest.  We will also add abdomen and pelvis scan for monitoring of left kidney cancer by Dr. Lovena Neighbours.  2.  Normocytic anemia: -Reviewed labs from 11/20/2020.  Hemoglobin improved to 14.5 from 11.6 previously.  Ferritin is 38. -She is tolerating iron tablet daily very well without any side effects. -Continue iron tablet daily. -RTC 4 months with repeat CBC, ferritin and iron panel.   Orders placed this encounter:  No orders of the defined types were placed in this encounter.    Derek Jack, MD [place] [phone]   I, Milinda Antis, am acting as a scribe for Dr. Sanda Linger.  I, Derek Jack MD, have reviewed the above documentation for accuracy and completeness, and I agree with the above.

## 2020-12-05 NOTE — Patient Instructions (Signed)
[place] at [facility] Discharge Instructions  You were seen today by Dr. Delton Coombes. He went over your recent results. Continue taking iron tablets daily; if you develop constipation, purchase stool softener over the counter and take daily. You will be scheduled to have a CT scan of your chest and abdomen done before your next visit. Dr. Delton Coombes will see you back in 4 months for labs and follow up.   Thank you for choosing [place] at [facility] to provide your oncology and hematology care.  To afford each patient quality time with our provider, please arrive at least 15 minutes before your scheduled appointment time.   If you have a lab appointment with the [facility] please come in thru the Main Entrance and check in at the main information desk  You need to re-schedule your appointment should you arrive 10 or more minutes late.  We strive to give you quality time with our providers, and arriving late affects you and other patients whose appointments are after yours.  Also, if you no show three or more times for appointments you may be dismissed from the clinic at the providers discretion.     Again, thank you for choosing [facility].  Our hope is that these requests will decrease the amount of time that you wait before being seen by our physicians.       _____________________________________________________________  Should you have questions after your visit to [facility], please contact our office at [phone] between the hours of 8:00 a.m. and 4:30 p.m.  Voicemails left after 4:00 p.m. will not be returned until the following business day.  For prescription refill requests, have your pharmacy contact our office and allow 72 hours.    Cancer Center Support Programs:   > Cancer Support Group  2nd Tuesday of the month 1pm-2pm, Journey Room

## 2021-01-11 IMAGING — CR DG CHEST 2V
2 series · 2 of 2 positions shown · non-contrast
Comparison: 12/08/2019

CLINICAL DATA: Lung cancer

EXAM:
CHEST - 2 VIEW

[w chest pa]
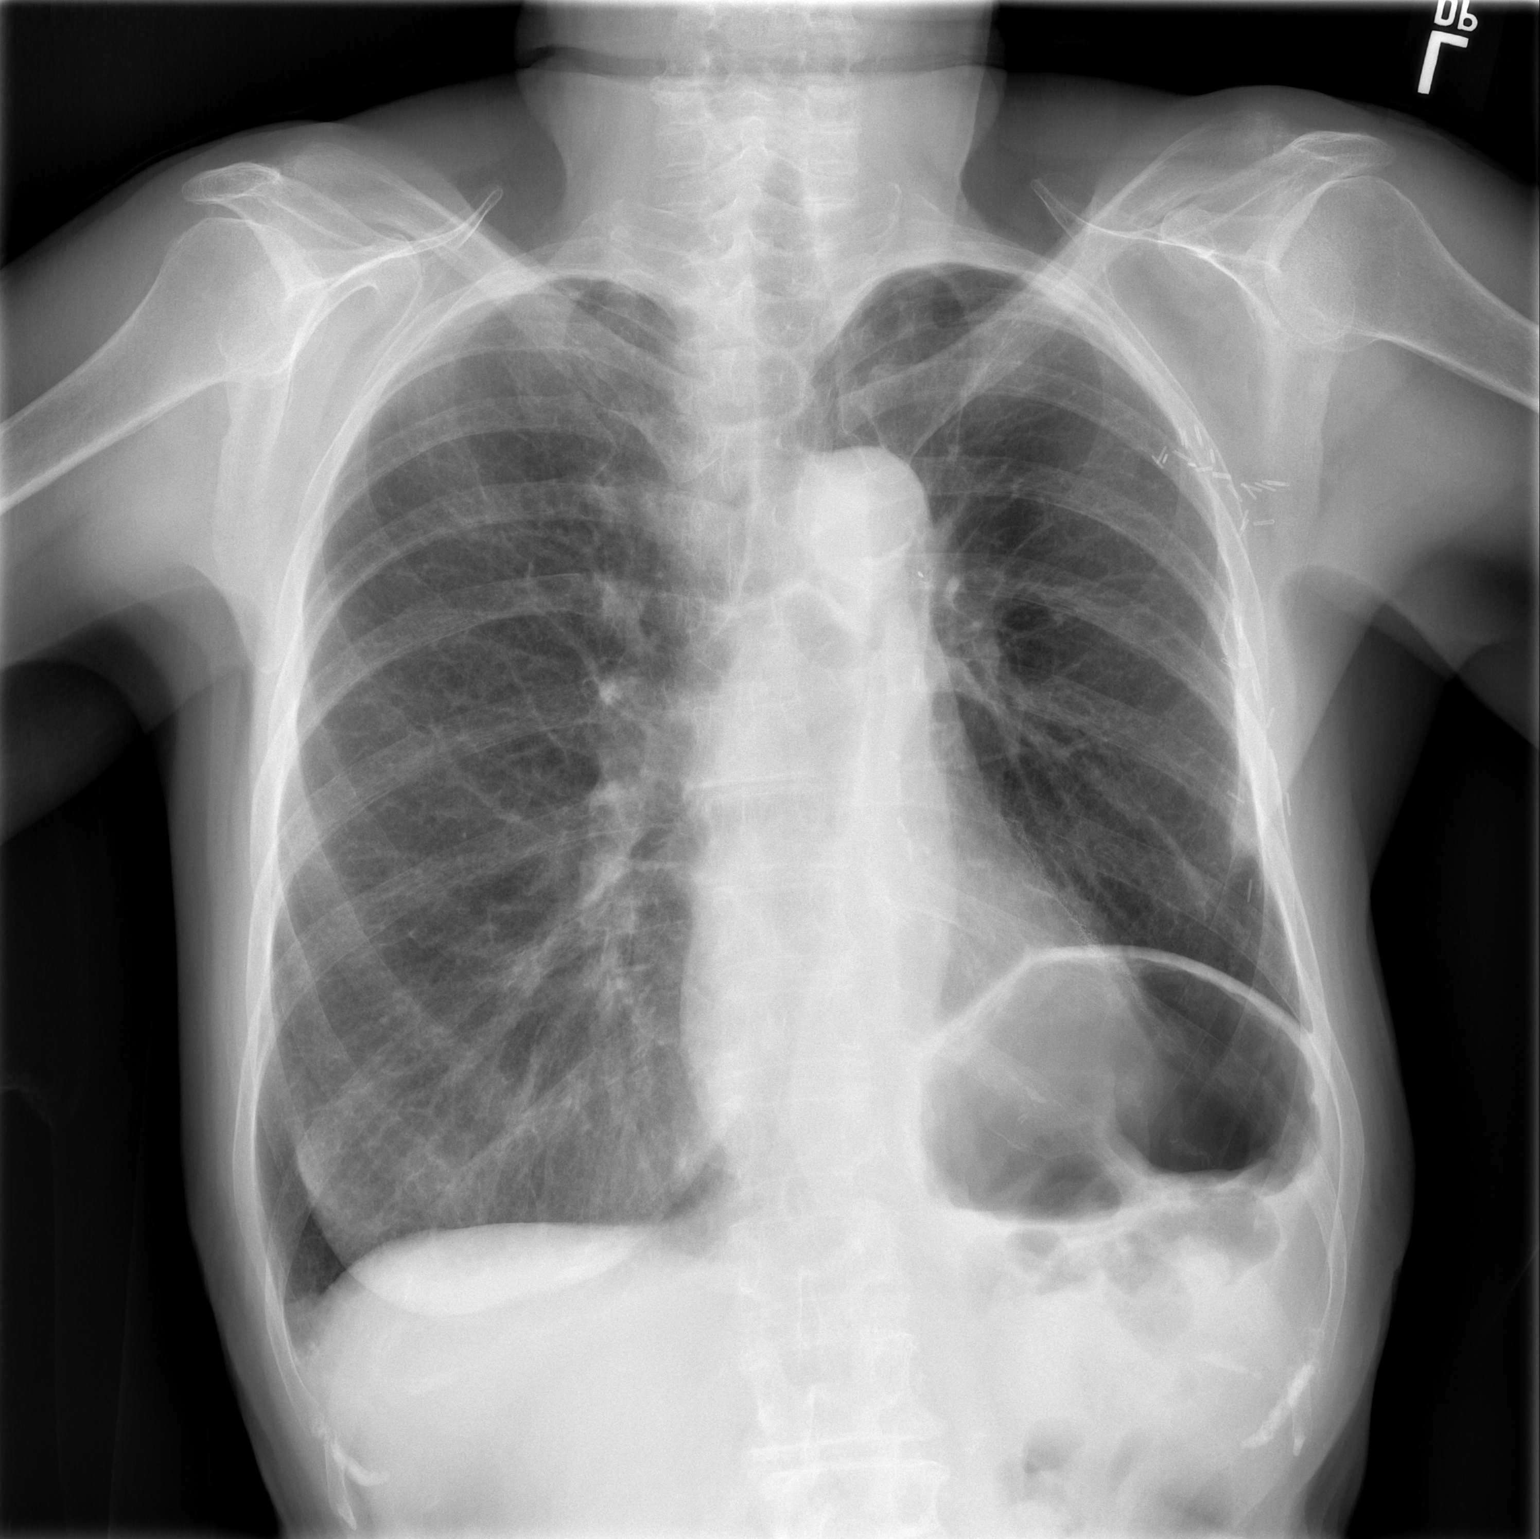

[w chest lat]
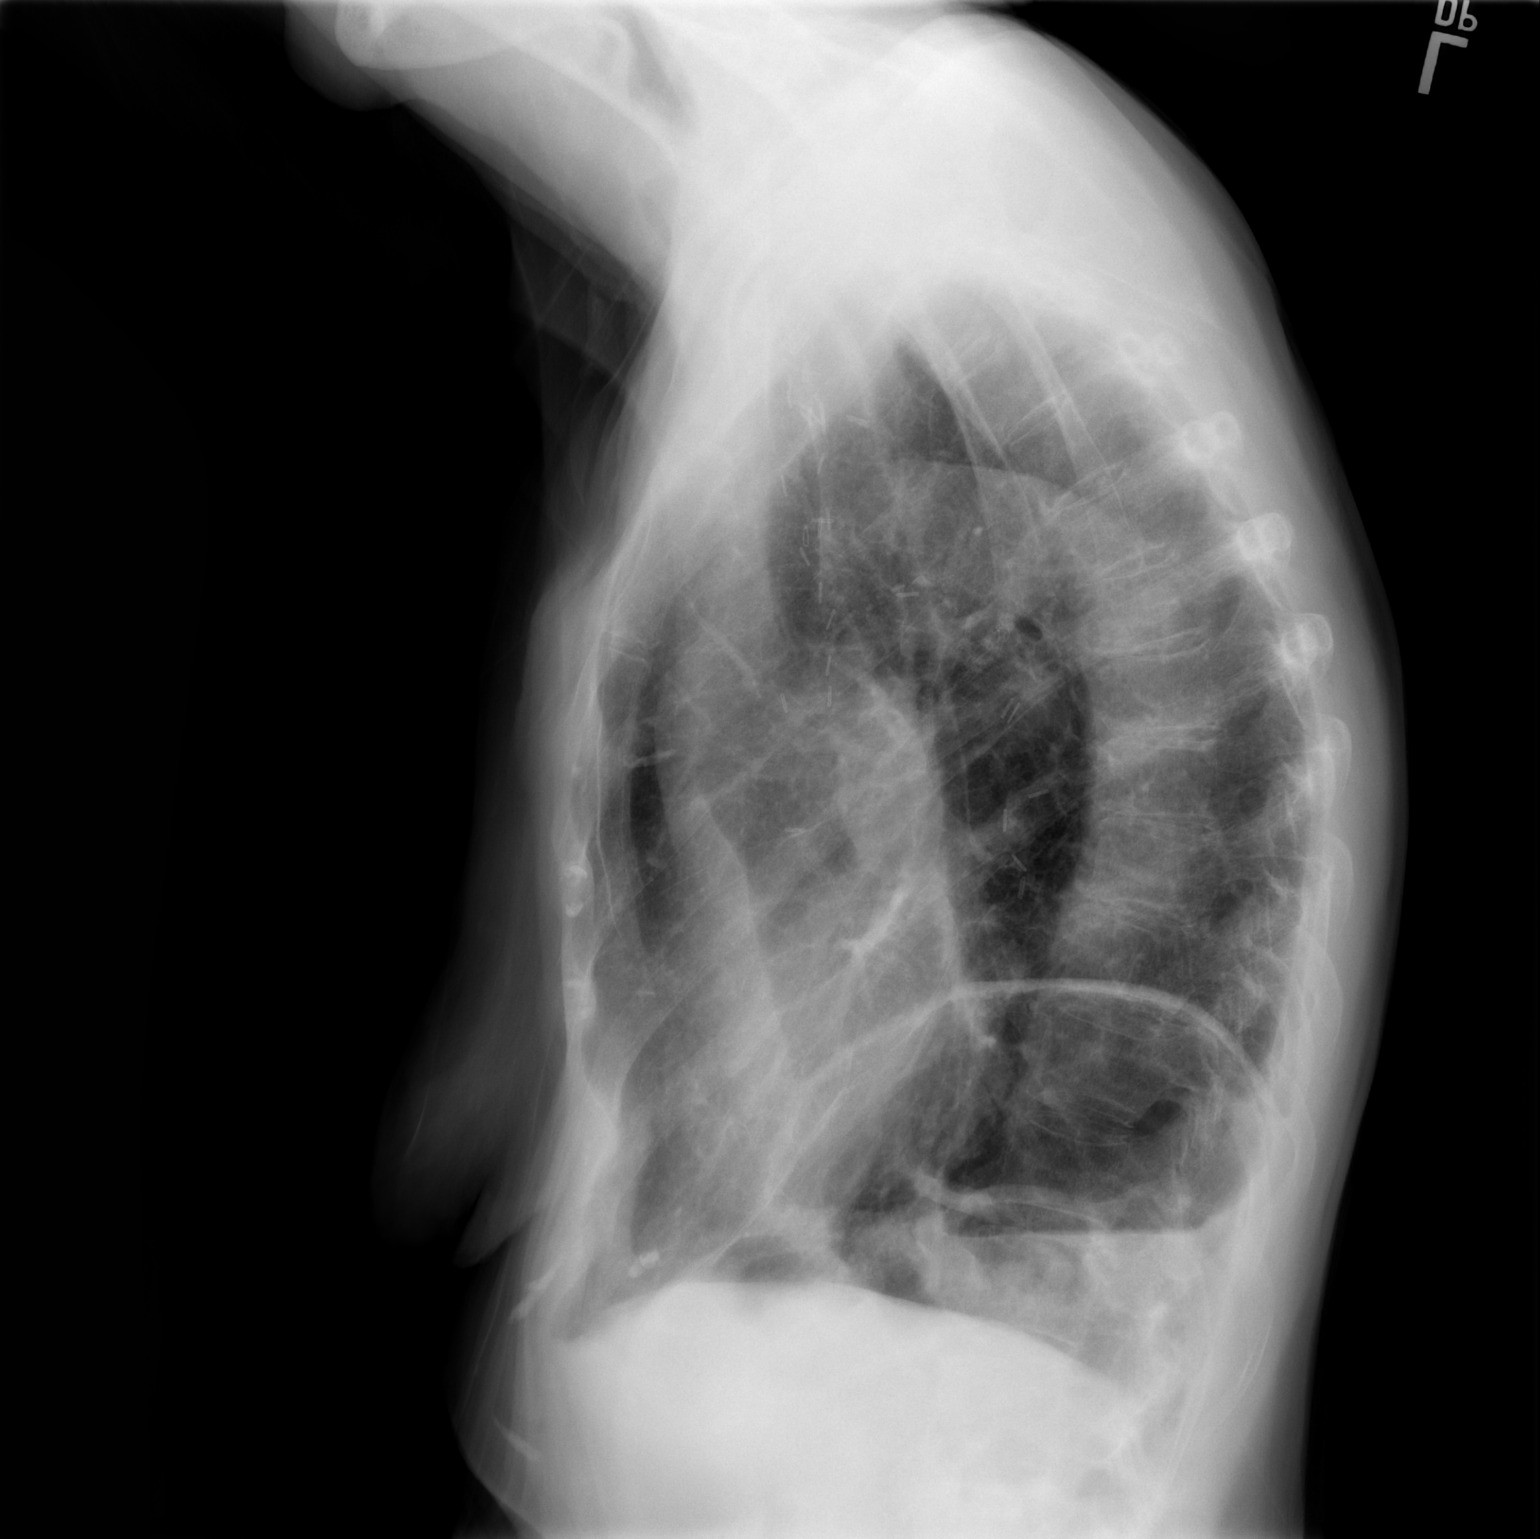

[2 of 2 positions shown; findings below may reference images not displayed]

FINDINGS: Normal heart size, mediastinal contours, and pulmonary vascularity.

Atherosclerotic calcification aorta.

Volume loss in the LEFT hemithorax secondary to prior lung
resection.

Emphysematous changes without infiltrate, pleural effusion or
pneumothorax.

Post LEFT mastectomy and axillary node dissection.

Bones demineralized.
IMPRESSION: Postsurgical changes of the LEFT hemithorax with underlying COPD.

No acute abnormalities.

## 2021-01-13 ENCOUNTER — Emergency Department (HOSPITAL_COMMUNITY)
Admission: EM | Admit: 2021-01-13 | Discharge: 2021-01-14 | Disposition: A | Discharge status: Home / Self Care | Payer: Medicare PPO | Attending: Emergency Medicine | Admitting: Emergency Medicine

## 2021-01-13 ENCOUNTER — Encounter (HOSPITAL_COMMUNITY): Payer: Self-pay | Admitting: Emergency Medicine

## 2021-01-13 DIAGNOSIS — K8020 Calculus of gallbladder without cholecystitis without obstruction: Secondary | ICD-10-CM | POA: Insufficient documentation

## 2021-01-13 DIAGNOSIS — Z85520 Personal history of malignant carcinoid tumor of kidney: Secondary | ICD-10-CM | POA: Diagnosis not present

## 2021-01-13 DIAGNOSIS — Z85118 Personal history of other malignant neoplasm of bronchus and lung: Secondary | ICD-10-CM | POA: Diagnosis not present

## 2021-01-13 DIAGNOSIS — Z79899 Other long term (current) drug therapy: Secondary | ICD-10-CM | POA: Insufficient documentation

## 2021-01-13 DIAGNOSIS — R1011 Right upper quadrant pain: Secondary | ICD-10-CM | POA: Diagnosis present

## 2021-01-13 DIAGNOSIS — Z853 Personal history of malignant neoplasm of breast: Secondary | ICD-10-CM | POA: Diagnosis not present

## 2021-01-13 DIAGNOSIS — Z87891 Personal history of nicotine dependence: Secondary | ICD-10-CM | POA: Diagnosis not present

## 2021-01-13 DIAGNOSIS — E039 Hypothyroidism, unspecified: Secondary | ICD-10-CM | POA: Diagnosis not present

## 2021-01-13 MED ORDER — ONDANSETRON HCL 4 MG/2ML IJ SOLN
4.0000 mg | Freq: Once | INTRAMUSCULAR | Status: AC
  Administered 2021-01-14: 4 mg via INTRAVENOUS
  Filled 2021-01-13: qty 2

## 2021-01-13 MED ORDER — FENTANYL CITRATE (PF) 100 MCG/2ML IJ SOLN
50.0000 ug | Freq: Once | INTRAMUSCULAR | Status: AC
  Administered 2021-01-14: 50 ug via INTRAVENOUS
  Filled 2021-01-13: qty 2

## 2021-01-13 NOTE — ED Triage Notes (Signed)
Pt reports R flank pain that started around lunch time. States that she has tried heat at home without relief. Reports a hx of gall and kidney stones. Denies dysuria. Only has her R kidney.

## 2021-01-14 ENCOUNTER — Emergency Department (HOSPITAL_COMMUNITY): Payer: Medicare PPO

## 2021-01-14 LAB — URINALYSIS, ROUTINE W REFLEX MICROSCOPIC
Bacteria, UA: NONE SEEN
Bilirubin Urine: NEGATIVE
Glucose, UA: NEGATIVE mg/dL
Hgb urine dipstick: NEGATIVE
Ketones, ur: 5 mg/dL — AB
Leukocytes,Ua: NEGATIVE
Nitrite: NEGATIVE
Protein, ur: 30 mg/dL — AB
Specific Gravity, Urine: 1.024 (ref 1.005–1.030)
pH: 5 (ref 5.0–8.0)

## 2021-01-14 LAB — CBC
HCT: 40.3 % (ref 36.0–46.0)
Hemoglobin: 13.5 g/dL (ref 12.0–15.0)
MCH: 31.5 pg (ref 26.0–34.0)
MCHC: 33.5 g/dL (ref 30.0–36.0)
MCV: 94.2 fL (ref 80.0–100.0)
Platelets: 265 10*3/uL (ref 150–400)
RBC: 4.28 MIL/uL (ref 3.87–5.11)
RDW: 13.2 % (ref 11.5–15.5)
WBC: 12.3 10*3/uL — ABNORMAL HIGH (ref 4.0–10.5)
nRBC: 0 % (ref 0.0–0.2)

## 2021-01-14 LAB — COMPREHENSIVE METABOLIC PANEL
ALT: 32 U/L (ref 0–44)
AST: 33 U/L (ref 15–41)
Albumin: 4.8 g/dL (ref 3.5–5.0)
Alkaline Phosphatase: 69 U/L (ref 38–126)
Anion gap: 10 (ref 5–15)
BUN: 20 mg/dL (ref 8–23)
CO2: 23 mmol/L (ref 22–32)
Calcium: 9.3 mg/dL (ref 8.9–10.3)
Chloride: 107 mmol/L (ref 98–111)
Creatinine, Ser: 0.98 mg/dL (ref 0.44–1.00)
GFR, Estimated: 59 mL/min — ABNORMAL LOW (ref >60)
Glucose, Bld: 143 mg/dL — ABNORMAL HIGH (ref 70–99)
Potassium: 4.1 mmol/L (ref 3.5–5.1)
Sodium: 140 mmol/L (ref 135–145)
Total Bilirubin: 0.7 mg/dL (ref 0.3–1.2)
Total Protein: 8 g/dL (ref 6.5–8.1)

## 2021-01-14 LAB — LIPASE, BLOOD: Lipase: 52 U/L — ABNORMAL HIGH (ref 11–51)

## 2021-01-14 MED ORDER — ONDANSETRON 4 MG PO TBDP: 4.0000 mg | ORAL_TABLET | Freq: Three times a day (TID) | ORAL | 0 refills | Status: DC | PRN

## 2021-01-14 MED ORDER — OXYCODONE-ACETAMINOPHEN 5-325 MG PO TABS: 1.0000 | ORAL_TABLET | Freq: Four times a day (QID) | ORAL | 0 refills | Status: DC | PRN

## 2021-01-14 NOTE — Discharge Instructions (Signed)
Please make an appointment with the surgery group listed.  If your symptoms change or worsen, please return to the emergency department.

## 2021-01-14 NOTE — ED Provider Notes (Signed)
[facility] DEPT Provider Note   CSN: [id number] Arrival date & time: 01/13/21  2227     History Chief Complaint  Patient presents with  . Flank Pain    R    [name] is a 80 y.o. female.  Patient presents to the emergency department with a chief complaint of right-sided upper abdominal pain.  She states the pain started around lunchtime today.  She reports associated nausea and vomiting.  She has history of gallstones.  She denies any fevers or chills.  Denies any dysuria or hematuria.  Reports her pain is about a 7 out of 10.  She denies any successful treatments prior to arrival.  The history is provided by the patient. No language interpreter was used.       Past Medical History:  Diagnosis Date  . Adenocarcinoma of left breast 04/16/2012   Stage II (T2 N0 M0) left-sided adenocarcinoma breast diagnosed in 1990 by Dr. Romona Curls and treated by Dr. Juanita Craver on NSAB PPD-19 with CMF followed by 5 years of tamoxifen and she remains disease free thus far.   . Breast cancer ([place])    left breast/mastectomy/dx approx1990  . Gall bladder stones   . Grave's disease   . High triglycerides   . History of kidney stones    small stone  rt   . Nerve compression    pinched in neck  . Pneumonia 1990  . PONV (postoperative nausea and vomiting)     Patient Active Problem List   Diagnosis Date Noted  . E. coli UTI   . Sepsis due to Escherichia coli with acute renal failure without septic shock ([place])   . AKI (acute kidney injury) ([place])   . Hypothyroidism 10/04/2020  . Pyelonephritis 10/03/2020  . Iron deficiency anemia 12/11/2019  . Dehydration 09/29/2019  . Acute dehydration 09/29/2019  . Lung cancer ([place]) 09/11/2019  . Left upper lobe pulmonary nodule 09/11/2019  . Nodule of left lung 07/29/2019  . Clear cell renal cell carcinoma, left ([place]) 04/13/2019  . Renal mass 03/25/2019  . Left kidney mass 02/16/2019  . Adenocarcinoma of left breast  04/16/2012    Past Surgical History:  Procedure Laterality Date  . CARPAL TUNNEL RELEASE     rt. hand  . cataract surgery     bilateral  . COLONOSCOPY    . EYE SURGERY    . LAPAROSCOPIC NEPHRECTOMY Left 03/25/2019   Procedure: LAPAROSCOPIC RADICAL NEPHRECTOMY;  Surgeon: Ceasar Mons, MD;  [facility];  Service: Urology;  Laterality: Left;  Marland Kitchen MASTECTOMY     left.  Marland Kitchen NODE DISSECTION Left 09/11/2019   Procedure: Node Dissection;  Surgeon: [name], MD;  Location: [facility];  Service: Thoracic;  Laterality: Left;  . THORACOTOMY/LOBECTOMY Left 09/11/2019   Procedure: Thoracotomy/Lobectomy Left Upper Lobe;  Surgeon: [name], MD;  Location: [facility];  Service: Thoracic;  Laterality: Left;  . TONSILLECTOMY       OB History   No obstetric history on file.     Family History  Problem Relation Age of Onset  . Cancer Father        lung cancer  . Cancer Brother        colon cancer  . Cancer Other        lung cancer/died age 43    Social History   Tobacco Use  . Smoking status: Former Smoker    Packs/day: 1.00    Years: 60.00  Pack years: 60.00    Types: Cigarettes    Quit date: 09/22/2019    Years since quitting: 1.3  . Smokeless tobacco: Never Used  . Tobacco comment: attempted to stop but its expensive to quit  Vaping Use  . Vaping Use: Never used  Substance Use Topics  . Alcohol use: No  . Drug use: No    Home Medications Prior to Admission medications   Medication Sig Start Date End Date Taking? Authorizing Provider  acetaminophen (TYLENOL) 500 MG tablet Take 500 mg by mouth every 6 (six) hours as needed for mild pain, moderate pain or headache.     [provider]  Calcium-Vitamin D (CVS CALCIUM-600/VIT D PO) Take 1 capsule by mouth daily.    [provider]  ferrous sulfate 325 (65 FE) MG EC tablet Take 325 mg by mouth daily.    [provider]  hydroxypropyl methylcellulose / hypromellose (ISOPTO  TEARS / GONIOVISC) 2.5 % ophthalmic solution Place 1 drop into both eyes 3 (three) times daily as needed for dry eyes.     [provider]  levothyroxine (SYNTHROID, LEVOTHROID) 100 MCG tablet Take 100 mcg by mouth daily before breakfast.  04/04/18   [provider]  meclizine (ANTIVERT) 25 MG tablet Take 25 mg by mouth daily as needed for dizziness or nausea. 05/24/20   [provider]  Multiple Vitamins-Minerals (CENTRUM SILVER ULTRA WOMENS) TABS Take 1 tablet by mouth daily.    [provider]  simvastatin (ZOCOR) 40 MG tablet Take 40 mg by mouth daily at 6 PM.  02/18/20   [provider]  trimethoprim (TRIMPEX) 100 MG tablet Take 100 mg by mouth daily.    [provider]  VASCEPA 1 g capsule Take 2 g by mouth 2 (two) times daily. 08/30/20   [provider]    Allergies    Chantix [varenicline]  Review of Systems   Review of Systems  All other systems reviewed and are negative.   Physical Exam Updated Vital Signs BP (!) 126/101 (BP Location: Right Arm)   Pulse (!) 106   Temp 98.6 F (37 C) (Oral)   Resp 16   Ht 5\' 5"  (1.651 m)   Wt 70.3 kg   SpO2 92%   BMI 25.79 kg/m   Physical Exam Vitals and nursing note reviewed.  Constitutional:      General: She is not in acute distress.    Appearance: She is well-developed.  HENT:     Head: Normocephalic and atraumatic.  Eyes:     Conjunctiva/sclera: Conjunctivae normal.  Cardiovascular:     Rate and Rhythm: Normal rate and regular rhythm.     Heart sounds: No murmur heard.   Pulmonary:     Effort: Pulmonary effort is normal. No respiratory distress.     Breath sounds: Normal breath sounds.  Abdominal:     Palpations: Abdomen is soft.     Tenderness: There is abdominal tenderness.     Comments: RUQ TTP  Musculoskeletal:        General: Normal range of motion.     Cervical back: Neck supple.  Skin:    General: Skin is warm and dry.  Neurological:     Mental  Status: She is alert and oriented to person, place, and time.  Psychiatric:        Mood and Affect: Mood normal.        Behavior: Behavior normal.     ED Results / Procedures /  Treatments   Labs (all labs ordered are listed, but only abnormal results are displayed) Labs Reviewed  LIPASE, BLOOD  COMPREHENSIVE METABOLIC PANEL  CBC  URINALYSIS, ROUTINE W REFLEX MICROSCOPIC    EKG None  Radiology No results found.  Procedures Procedures   Medications Ordered in ED Medications  fentaNYL (SUBLIMAZE) injection 50 mcg (has no administration in time range)  ondansetron (ZOFRAN) injection 4 mg (has no administration in time range)    ED Course  I have reviewed the triage vital signs and the nursing notes.  Pertinent labs & imaging results that were available during my care of the patient were reviewed by me and considered in my medical decision making (see chart for details).    MDM Rules/Calculators/A&P                          This patient complains of RUQ pain, this involves an extensive number of treatment options, and is a complaint that carries with it a high risk of complications and morbidity.    Differential Dx Cholecystitis, cholelithiasis, pancreatitis, kidney stone, cholangitis  No evidence of cholecystitis seen on ultrasound or CT.  Pancreas is without evidence of pancreatitis.  No kidney stone.  No evidence of cholangitis, no fever, no jaundice.  Pertinent Labs I ordered, reviewed, and interpreted labs, which included CBC showed mild leukocytosis of 12.1, CMP notable for normal LFTs and normal creatinine, lipase mildly elevated at 52, urinalysis is abnormal, but patient has no urinary symptoms.  Imaging Interpretation I ordered imaging studies which included right upper quadrant ultrasound which showed cholelithiasis, but no evidence of cholecystitis.  CT abdomen/pelvis showed no acute finding.  Medications I ordered medication fentanyl and Zofran for pain  and nausea.  Reassessments After the interventions stated above, I reevaluated the patient and found feeling much better, she is no longer having any pain.  Consultants None  Plan Discharge with close outpatient follow-up with general surgery.  Patient understands agrees the plan.    Final Clinical Impression(s) / ED Diagnoses Final diagnoses:  Calculus of gallbladder without cholecystitis without obstruction    Rx / DC Orders ED Discharge Orders         Ordered    oxyCODONE-acetaminophen (PERCOCET) 5-325 MG tablet  Every 6 hours PRN        01/14/21 0511    ondansetron (ZOFRAN ODT) 4 MG disintegrating tablet  Every 8 hours PRN        01/14/21 0511           Montine Circle, PA-C 01/14/21 7425    Ezequiel Essex, MD 01/15/21 0422

## 2021-02-13 ENCOUNTER — Other Ambulatory Visit: Payer: Self-pay | Admitting: Thoracic Surgery (Cardiothoracic Vascular Surgery)

## 2021-02-13 DIAGNOSIS — R911 Solitary pulmonary nodule: Secondary | ICD-10-CM

## 2021-02-14 ENCOUNTER — Ambulatory Visit: Payer: Medicare PPO | Admitting: Thoracic Surgery (Cardiothoracic Vascular Surgery)

## 2021-02-14 ENCOUNTER — Other Ambulatory Visit: Payer: Self-pay

## 2021-02-14 ENCOUNTER — Ambulatory Visit
Admission: RE | Admit: 2021-02-14 | Discharge: 2021-02-14 | Disposition: A | Discharge status: Home / Self Care | Payer: Medicare PPO | Source: Ambulatory Visit | Attending: Thoracic Surgery (Cardiothoracic Vascular Surgery) | Admitting: Thoracic Surgery (Cardiothoracic Vascular Surgery)

## 2021-02-14 VITALS — BP 160/96 | HR 90 | Resp 20 | Ht 65.0 in | Wt 153.0 lb

## 2021-02-14 DIAGNOSIS — Z902 Acquired absence of lung [part of]: Secondary | ICD-10-CM

## 2021-02-14 DIAGNOSIS — C3492 Malignant neoplasm of unspecified part of left bronchus or lung: Secondary | ICD-10-CM | POA: Diagnosis not present

## 2021-02-14 DIAGNOSIS — R911 Solitary pulmonary nodule: Secondary | ICD-10-CM

## 2021-02-14 NOTE — Progress Notes (Signed)
[address]       [place] [address]             [phone]       HPI: 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   She had a left upper lobectomy in January 2021.  We started with a robotic approach but had to convert to thoracotomy because it was a lymph node invading the main PA.  She had failure to thrive and dehydration early on after surgery.  She had a lot of pain as well.  She was not given adjuvant chemotherapy because of her other issues.  I last saw her in December 2021.  She was doing better at that time.  She still had pain related to her thoracotomy was only occasionally taking Tylenol for that.  In the interim since her last visit she recently had a bout of severe abdominal pain.  Turned out to be due to gallstones.  She sees a Education officer, environmental about that on Friday.  Past Medical History:  Diagnosis Date   Adenocarcinoma of left breast 04/16/2012   Stage II (T2 N0 M0) left-sided adenocarcinoma breast diagnosed in 1990 by Dr. Romona Curls and treated by Dr. Juanita Craver on NSAB PPD-19 with CMF followed by 5 years of tamoxifen and she remains disease free thus far.    Breast cancer ([place])    left breast/mastectomy/dx approx1990   Gall bladder stones    Grave's disease    High triglycerides    History of kidney stones    small stone  rt    Nerve compression    pinched in neck   Pneumonia 1990   PONV (postoperative nausea and vomiting)     Current Outpatient Medications  Medication Sig Dispense Refill   Calcium-Vitamin D ([employer] CALCIUM-600/VIT D PO) Take 1 capsule by mouth daily.     ferrous sulfate 325 (65 FE) MG EC tablet Take 325 mg by mouth daily.     hydroxypropyl  methylcellulose / hypromellose (ISOPTO TEARS / GONIOVISC) 2.5 % ophthalmic solution Place 1 drop into both eyes 3 (three) times daily as needed for dry eyes.      levothyroxine (SYNTHROID, LEVOTHROID) 100 MCG tablet Take 100 mcg by mouth daily before breakfast.      meclizine (ANTIVERT) 25 MG tablet Take 25 mg by mouth daily as needed for dizziness or nausea.     Multiple Vitamins-Minerals (CENTRUM SILVER ULTRA WOMENS) TABS Take 1 tablet by mouth daily.     simvastatin (ZOCOR) 40 MG tablet Take 80 mg by mouth daily at 6 PM.     ondansetron (ZOFRAN ODT) 4 MG disintegrating tablet Take 1 tablet (4 mg total) by mouth every 8 (eight) hours as needed for nausea or vomiting. (Patient not taking: Reported on 02/14/2021) 10 tablet 0   oxyCODONE-acetaminophen (PERCOCET) 5-325 MG tablet Take 1 tablet by mouth every 6 (six) hours as needed. (Patient not taking: Reported on 02/14/2021) 10 tablet 0   No current facility-administered medications for this visit.    Physical Exam BP (!) 160/96   Pulse 90   Resp 20   Ht 5\' 5"  (1.651 m)   Wt 153 lb (69.4 kg)   SpO2 92% Comment: RA  BMI 25.46  kg/m  80 year old woman in no acute distress Alert and oriented x3 with no focal deficits Lungs absent breath sounds left base, otherwise clear Incisions well-healed Cardiac regular rate and rhythm No peripheral edema  Diagnostic Tests: I personally reviewed her chest x-ray images.  There is elevation of the left hemidiaphragm with volume loss from the resection.  No effusions or suspicious lung findings.  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   Stage IIb squamous cell carcinoma left upper lobe (T1,N1)-now about a year and a half out from left upper lobectomy and node dissection  with no evidence of recurrent disease.  Unfortunately she had failure to thrive and cannot tolerate adjuvant chemotherapy.  She is being followed by Dr. Delton Coombes in [place].  Postthoracotomy pain-still has pain but manages that with an occasional Tylenol.  Biliary colic-to see general surgeon this Friday  Plan: At this point she is following regularly with Dr. Delton Coombes.  I offered the option of just following up with him and I can see her if needed.  She liked that idea.  I will be happy to see her back anytime if I can be of any assistance with her care  [name], MD [facility] [phone]

## 2021-03-10 ENCOUNTER — Other Ambulatory Visit (HOSPITAL_COMMUNITY): Payer: Self-pay | Admitting: Family Medicine

## 2021-03-10 DIAGNOSIS — Z1231 Encounter for screening mammogram for malignant neoplasm of breast: Secondary | ICD-10-CM

## 2021-03-20 ENCOUNTER — Ambulatory Visit: Payer: Medicare PPO | Admitting: Internal Medicine

## 2021-03-20 ENCOUNTER — Encounter: Payer: Self-pay | Admitting: Internal Medicine

## 2021-03-20 ENCOUNTER — Other Ambulatory Visit: Payer: Self-pay

## 2021-03-20 DIAGNOSIS — R0600 Dyspnea, unspecified: Secondary | ICD-10-CM | POA: Insufficient documentation

## 2021-03-20 DIAGNOSIS — R0609 Other forms of dyspnea: Secondary | ICD-10-CM | POA: Insufficient documentation

## 2021-03-20 NOTE — Patient Instructions (Addendum)
You have a significant risk of complications involving your R lower lobe if you require gall bladder surgery but if your symptoms convince your surgeon that surgery is warranted, then I will clear you without additonal  testing

## 2021-03-20 NOTE — Progress Notes (Signed)
[name], female    DOB: July 01, 1941,   MRN: [id number]   Brief patient profile:  72 yow quit smoking 09/2019  @ dx of II B sq cell ca s/p L ULobectomy  09/11/19 with very prolonged convascence per daughter referred to pulmonary clinic in [place]  03/20/2021 by Dr    Kae Heller for clearance fo GB surgery   PFts 08/10/2019 showed no significant copd      History of Present Illness  03/20/2021  Pulmonary/ 1st office eval/ Christelle Igoe / [facility]  Chief Complaint  Patient presents with   Follow-up    Referred by Dr Kae Heller- pt needs pulmonary clearance for gallbladder surgery. Pt has hx of lung cancer.   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   Sleeping as above  without nocturnal  or early am exacerbation  of respiratory  c/o's or need for noct saba. Also denies any obvious fluctuation of symptoms with weather or environmental changes or other aggravating or alleviating factors except as outlined above   No unusual exposure hx or h/o childhood pna/ asthma or knowledge of premature birth.  Current Allergies, Complete Past Medical History, Past Surgical History, Family History, and Social History were reviewed in [employer] record.  ROS  The following are not active complaints unless bolded Hoarseness, sore throat, dysphagia, dental problems, itching, sneezing,  nasal congestion or discharge of excess mucus or purulent secretions, ear ache,   fever, chills, sweats, unintended wt loss or wt  gain, classically pleuritic or exertional cp,  orthopnea pnd or arm/hand swelling  or leg swelling, presyncope, palpitations, abdominal pain, anorexia, nausea, vomiting, diarrhea  or change in bowel habits or change in bladder habits, change in stools or change in urine, dysuria, hematuria,  rash, arthralgias, visual complaints, headache, numbness, weakness or ataxia or problems with walking or coordination,  change in mood or  memory.              Past Medical History:  Diagnosis Date   Adenocarcinoma of left breast 04/16/2012   Stage II (T2 N0 M0) left-sided adenocarcinoma breast diagnosed in 1990 by Dr. Romona Curls and treated by Dr. Juanita Craver on NSAB PPD-19 with CMF followed by 5 years of tamoxifen and she remains disease free thus far.    Breast cancer ([place])    left breast/mastectomy/dx approx1990   Gall bladder stones    Grave's disease    High triglycerides    History of kidney stones    small stone  rt    Nerve compression    pinched in neck   Pneumonia 1990   PONV (postoperative nausea and vomiting)     Outpatient Medications Prior to Visit  Medication Sig Dispense Refill   Calcium-Vitamin D (CVS CALCIUM-600/VIT D PO) Take 1 capsule by mouth daily.     ferrous sulfate 325 (65 FE) MG EC tablet Take 325 mg by mouth daily.     hydroxypropyl  methylcellulose / hypromellose (ISOPTO TEARS / GONIOVISC) 2.5 % ophthalmic solution Place 1 drop into both eyes 3 (three) times daily as needed for dry eyes.      levothyroxine (SYNTHROID, LEVOTHROID) 100 MCG tablet Take 100 mcg by mouth daily before breakfast.      meclizine (ANTIVERT) 25 MG tablet Take 25 mg by mouth daily as needed for dizziness or nausea.     Multiple Vitamins-Minerals (CENTRUM SILVER ULTRA WOMENS) TABS Take 1 tablet by mouth daily.     ondansetron (ZOFRAN ODT) 4 MG disintegrating tablet Take 1 tablet (4 mg total) by mouth every 8 (eight) hours as needed for nausea or vomiting. 10 tablet 0   oxyCODONE-acetaminophen  (PERCOCET) 5-325 MG tablet Take 1 tablet by mouth every 6 (six) hours as needed. 10 tablet 0   simvastatin (ZOCOR) 80 MG tablet Take 80 mg by mouth daily.     simvastatin (ZOCOR) 40 MG tablet Take 80 mg by mouth daily at 6 PM.     No facility-administered medications prior to visit.     Objective:     BP 126/74 (BP Location: Left Arm, Cuff Size: Normal)   Pulse 87   Temp 98.3 F (36.8 C) (Oral)   Ht 5\' 5"  (1.651 m)   Wt 152 lb (68.9 kg)   SpO2 95% Comment: on RA  BMI 25.29 kg/m   SpO2: 95 % (on RA)  Wt Readings from Last 3 Encounters:  03/20/21 152 lb (68.9 kg)  02/14/21 153 lb (69.4 kg)  01/13/21 155 lb (70.3 kg)      General appearance:      Somber wf nad    HEENT : pt wearing mask not removed for exam due to covid -19 concerns.    NECK :  without JVD/Nodes/TM/ nl carotid upstrokes bilaterally   LUNGS: no acc muscle use,  Nl contour chest which is clear to A and P bilaterally without cough on insp or exp maneuvers   CV:  RRR  no s3 or murmur or increase in P2, and no edema   ABD:  soft and diffuse mild tenderness across upper abd R to L without rebound or Murphy's sign with nl inspiratory excursion in the supine position. No bruits or organomegaly appreciated, bowel sounds nl  MS:  walks with cane / ext warm without deformities, calf tenderness, cyanosis or clubbing No obvious joint restrictions   SKIN: warm and dry without lesions    NEURO:  alert, approp, nl sensorium with  no motor or cerebellar deficits apparent.      I personally reviewed images and agree with radiology impression as follows:  CXR:   02/14/21 Postoperative change on the left with elevation Left hemidiaphragm. No edema or airspace opacity. No pulmonary nodular lesions. Heart size normal. Status post left mastectomy.      Assessment   DOE (dyspnea on exertion) Quit smoking 09/2019  - PFT's  08/10/2019 FEV1 1.93 (88 % ) ratio 0.72  p 7 % improvement from saba p nothing prior to  study with DLCO  10.53 (53%) corrects to 2.48 (60%)  for alv volume   -  03/20/2021   Walked RA  approx  450 ft  @ slow to moderate pace  stopped due to  End of study s sob/ sats at end 93%     She has no cough or limiting sob with no copd on pfts at time she quit smoking so main concern is that she has had LULobectomy with vol loss  on that side but intact LLL and nl R chest clinically so should tolerate surgery ok from a pulmonary perspective  My concerns are that she appears to have overall decline greater than expected for her age or lung function and that tolerated surgery 1.5 years ago poorly so my advice is to be sure the benefit (relieving the pain and reducing risk of rupture/sepsis) are high relative to the risk in this pt with atypical GB pain and overall poor apparent reserve.   If above is the case, then proceed with surgery w/on additional pulmonary studies as there is no fixed "red line" in pts for non Tsurgery procedures and should have good cough mechanics post op based on absence of copd on previous pfts done w/in a month on quit smoking so risk of atx /mucus obstruction is relatively low.    Each maintenance medication was reviewed in detail including emphasizing most importantly the difference between maintenance and prns and under what circumstances the prns are to be triggered using an action plan format where appropriate.  Total time for H and P, chart review, counseling,  directly observing portions of ambulatory 02 saturation study/ and generating customized AVS unique to this office visit / same day charting  > 60 min                     Christinia Gully, MD 03/20/2021

## 2021-03-20 NOTE — Assessment & Plan Note (Signed)
Quit smoking 09/2019  - PFT's  08/10/2019 FEV1 1.93 (88 % ) ratio 0.72  p 7 % improvement from saba p nothing prior to study with DLCO  10.53 (53%) corrects to 2.48 (60%)  for alv volume   -  03/20/2021   Walked RA  approx  450 ft  @ slow to moderate pace  stopped due to  End of study s sob/ sats at end 93%     She has no cough or limiting sob with no copd on pfts at time she quit smoking so main concern is that she has had LULobectomy with vol loss on that side but intact LLL and nl R chest clinically so should tolerate surgery ok from a pulmonary perspective  My concerns are that she appears to have overall decline greater than expected for her age or lung function and that tolerated surgery 1.5 years ago poorly so my advice is to be sure the benefit (relieving the pain and reducing risk of rupture/sepsis) are high relative to the risk in this pt with atypical GB pain and overall poor apparent reserve.   If above is the case, then proceed with surgery w/on additional pulmonary studies as there is no fixed "red line" in pts for non Tsurgery procedures and should have good cough mechanics post op based on absence of copd on previous pfts done w/in a month on quit smoking so risk of atx /mucus obstruction is relatively low.    Each maintenance medication was reviewed in detail including emphasizing most importantly the difference between maintenance and prns and under what circumstances the prns are to be triggered using an action plan format where appropriate.  Total time for H and P, chart review, counseling,  directly observing portions of ambulatory 02 saturation study/ and generating customized AVS unique to this office visit / same day charting  > 60 min

## 2021-04-03 ENCOUNTER — Inpatient Hospital Stay (HOSPITAL_COMMUNITY): Payer: Medicare PPO | Attending: Hematology

## 2021-04-03 ENCOUNTER — Ambulatory Visit (HOSPITAL_COMMUNITY)
Admission: RE | Admit: 2021-04-03 | Discharge: 2021-04-03 | Disposition: A | Discharge status: Home / Self Care | Payer: Medicare PPO | Source: Ambulatory Visit | Attending: Hematology | Admitting: Hematology

## 2021-04-03 ENCOUNTER — Other Ambulatory Visit: Payer: Self-pay

## 2021-04-03 DIAGNOSIS — D509 Iron deficiency anemia, unspecified: Secondary | ICD-10-CM | POA: Insufficient documentation

## 2021-04-03 DIAGNOSIS — Z902 Acquired absence of lung [part of]: Secondary | ICD-10-CM | POA: Diagnosis not present

## 2021-04-03 DIAGNOSIS — Z87891 Personal history of nicotine dependence: Secondary | ICD-10-CM | POA: Insufficient documentation

## 2021-04-03 DIAGNOSIS — D649 Anemia, unspecified: Secondary | ICD-10-CM | POA: Insufficient documentation

## 2021-04-03 DIAGNOSIS — C3492 Malignant neoplasm of unspecified part of left bronchus or lung: Secondary | ICD-10-CM | POA: Insufficient documentation

## 2021-04-03 DIAGNOSIS — Z800 Family history of malignant neoplasm of digestive organs: Secondary | ICD-10-CM | POA: Insufficient documentation

## 2021-04-03 DIAGNOSIS — Z85528 Personal history of other malignant neoplasm of kidney: Secondary | ICD-10-CM | POA: Insufficient documentation

## 2021-04-03 DIAGNOSIS — Z85118 Personal history of other malignant neoplasm of bronchus and lung: Secondary | ICD-10-CM | POA: Diagnosis present

## 2021-04-03 DIAGNOSIS — C642 Malignant neoplasm of left kidney, except renal pelvis: Secondary | ICD-10-CM | POA: Insufficient documentation

## 2021-04-03 DIAGNOSIS — Z905 Acquired absence of kidney: Secondary | ICD-10-CM | POA: Insufficient documentation

## 2021-04-03 DIAGNOSIS — Z801 Family history of malignant neoplasm of trachea, bronchus and lung: Secondary | ICD-10-CM | POA: Diagnosis not present

## 2021-04-03 LAB — CBC WITH DIFFERENTIAL/PLATELET
Abs Immature Granulocytes: 0.01 K/uL (ref 0.00–0.07)
Basophils Absolute: 0.1 K/uL (ref 0.0–0.1)
Basophils Relative: 1 %
Eosinophils Absolute: 0.2 K/uL (ref 0.0–0.5)
Eosinophils Relative: 3 %
HCT: 42 % (ref 36.0–46.0)
Hemoglobin: 13.4 g/dL (ref 12.0–15.0)
Immature Granulocytes: 0 %
Lymphocytes Relative: 23 %
Lymphs Abs: 1.4 K/uL (ref 0.7–4.0)
MCH: 31.4 pg (ref 26.0–34.0)
MCHC: 31.9 g/dL (ref 30.0–36.0)
MCV: 98.4 fL (ref 80.0–100.0)
Monocytes Absolute: 0.4 K/uL (ref 0.1–1.0)
Monocytes Relative: 7 %
Neutro Abs: 4.1 K/uL (ref 1.7–7.7)
Neutrophils Relative %: 66 %
Platelets: 259 K/uL (ref 150–400)
RBC: 4.27 MIL/uL (ref 3.87–5.11)
RDW: 12.4 % (ref 11.5–15.5)
WBC: 6.1 K/uL (ref 4.0–10.5)
nRBC: 0 % (ref 0.0–0.2)

## 2021-04-03 LAB — COMPREHENSIVE METABOLIC PANEL
ALT: 24 U/L (ref 0–44)
AST: 27 U/L (ref 15–41)
Albumin: 4.4 g/dL (ref 3.5–5.0)
Alkaline Phosphatase: 68 U/L (ref 38–126)
Anion gap: 9 (ref 5–15)
BUN: 20 mg/dL (ref 8–23)
CO2: 26 mmol/L (ref 22–32)
Calcium: 9.2 mg/dL (ref 8.9–10.3)
Chloride: 106 mmol/L (ref 98–111)
Creatinine, Ser: 0.92 mg/dL (ref 0.44–1.00)
GFR, Estimated: >60 mL/min (ref >60)
Glucose, Bld: 90 mg/dL (ref 70–99)
Potassium: 4.3 mmol/L (ref 3.5–5.1)
Sodium: 141 mmol/L (ref 135–145)
Total Bilirubin: 0.7 mg/dL (ref 0.3–1.2)
Total Protein: 7.5 g/dL (ref 6.5–8.1)

## 2021-04-03 LAB — IRON AND TIBC
Iron: 77 ug/dL (ref 28–170)
Saturation Ratios: 22 % (ref 10.4–31.8)
TIBC: 342 ug/dL (ref 250–450)
UIBC: 265 ug/dL

## 2021-04-03 LAB — FERRITIN: Ferritin: 44 ng/mL (ref 11–307)

## 2021-04-10 ENCOUNTER — Ambulatory Visit (HOSPITAL_COMMUNITY): Payer: Medicare PPO | Admitting: Hematology

## 2021-04-10 NOTE — Progress Notes (Signed)
[address] [place], [place] [address]   CLINIC:  Medical Oncology/Hematology  PCP:  Alanson Puls, [facility]. / [place] [address] [phone]   REASON FOR VISIT:  Follow-up for squamous cell carcinoma of left lung and IDA  PRIOR THERAPY:  1. Left upper lobe lobectomy on 09/11/2019. 2. Intermittent Feraheme last on 04/01/2020.  NGS Results: Not done  CURRENT THERAPY: Iron tablets daily  BRIEF ONCOLOGIC HISTORY:  Oncology History  Clear cell renal cell carcinoma, left ([place])  04/13/2019 Initial Diagnosis   Clear cell renal cell carcinoma, left ([place])   04/13/2019 Cancer Staging   Staging form: Kidney, AJCC 8th Edition - Clinical: Stage III (cT3a, cNX, cM0) - Signed by Derek Jack, MD on 04/13/2019   Lung cancer ([place])  09/11/2019 Initial Diagnosis   Lung cancer ([place])   10/26/2019 Cancer Staging   Staging form: Lung, AJCC 8th Edition - Clinical stage from 10/26/2019: Stage IIB (cT1b, cN1, cM0) - Signed by Derek Jack, MD on 10/26/2019     CANCER STAGING: Cancer Staging Adenocarcinoma of left breast Staging form: Breast, AJCC 7th Edition - Clinical: Stage IIA (T2, N0, cM0) - Signed by Baird Cancer, PA on 04/16/2012  Clear cell renal cell carcinoma, left ([facility]) Staging form: Kidney, AJCC 8th Edition - Clinical: Stage III (cT3a, cNX, cM0) - Signed by Derek Jack, MD on 04/13/2019  Lung cancer [facility]) Staging form: Lung, AJCC 8th Edition - Clinical stage from 10/26/2019: Stage IIB (cT1b, cN1, cM0) - Signed by Derek Jack, MD on 10/26/2019   INTERVAL HISTORY:  Ms. MALEIYA PERGOLA, a 80 y.o. female, returns for routine follow-up of her squamous cell carcinoma of left lung and IDA. Koya was last seen on 12/05/2020.   On exam today Mrs. Odonohue is accompanied by her daughter.  She notes that she does tolerate the p.o. iron therapy well with only some occasional stomach upset.  She does not have dark  bowel movements.  She does endorse having some lower back pain on her left side which rotates around under her left breast.  She is able to breathe deeply without difficulty.  She notes that she did recently have a trip to the emergency department in May at which time there was concern that there was a stone causing her to have abdominal pain nausea and vomiting.  Otherwise she denies any fevers, chills, sweats, nausea, vomiting or diarrhea today.  Full 10 point ROS is listed below.   REVIEW OF SYSTEMS:  Review of Systems  Constitutional:  Positive for fatigue (depleted). Negative for appetite change.  Respiratory:  Positive for shortness of breath.   Gastrointestinal:  Positive for abdominal pain (8/10 intermittent upper abdominal pain). Negative for constipation, diarrhea, nausea and vomiting.  Neurological:  Positive for dizziness and headaches.  All other systems reviewed and are negative.  PAST MEDICAL/SURGICAL HISTORY:  Past Medical History:  Diagnosis Date   Adenocarcinoma of left breast 04/16/2012   Stage II (T2 N0 M0) left-sided adenocarcinoma breast diagnosed in 1990 by Dr. Romona Curls and treated by Dr. Juanita Craver on NSAB PPD-19 with CMF followed by 5 years of tamoxifen and she remains disease free thus far.    Breast cancer ([place])    left breast/mastectomy/dx approx1990   Gall bladder stones    Grave's disease    High triglycerides    History of kidney stones    small stone  rt    Nerve compression    pinched  in neck   Pneumonia 1990   PONV (postoperative nausea and vomiting)    Past Surgical History:  Procedure Laterality Date   CARPAL TUNNEL RELEASE     rt. hand   cataract surgery     bilateral   COLONOSCOPY     EYE SURGERY     LAPAROSCOPIC NEPHRECTOMY Left 03/25/2019   Procedure: LAPAROSCOPIC RADICAL NEPHRECTOMY;  Surgeon: Ceasar Mons, MD;  Location: [facility];  Service: Urology;  Laterality: Left;   MASTECTOMY     left.   NODE DISSECTION Left 09/11/2019    Procedure: Node Dissection;  Surgeon: [name], MD;  Location: [facility];  Service: Thoracic;  Laterality: Left;   THORACOTOMY/LOBECTOMY Left 09/11/2019   Procedure: Thoracotomy/Lobectomy Left Upper Lobe;  Surgeon: [name], MD;  Location: [facility];  Service: Thoracic;  Laterality: Left;   TONSILLECTOMY      SOCIAL HISTORY:  Social History   Socioeconomic History   Marital status: Divorced    Spouse name: Not on file   Number of children: Not on file   Years of education: Not on file   Highest education level: Not on file  Occupational History   Not on file  Tobacco Use   Smoking status: Former    Packs/day: 1.00    Years: 60.00    Pack years: 60.00    Types: Cigarettes    Quit date: 09/22/2019    Years since quitting: 1.5   Smokeless tobacco: Never   Tobacco comments:    attempted to stop but its expensive to quit  Vaping Use   Vaping Use: Never used  Substance and Sexual Activity   Alcohol use: No   Drug use: No   Sexual activity: Not on file  Other Topics Concern   Not on file  Social History Narrative   Not on file   Social Determinants of Health   Financial Resource Strain: Not on file  Food Insecurity: Not on file  Transportation Needs: Not on file  Physical Activity: Not on file  Stress: Not on file  Social Connections: Not on file  Intimate Partner Violence: Not At Risk   Fear of Current or Ex-Partner: No   Emotionally Abused: No   Physically Abused: No   Sexually Abused: No    FAMILY HISTORY:  Family History  Problem Relation Age of Onset   Cancer Father        lung cancer   Cancer Brother        colon cancer   Cancer Other        lung cancer/died age 61    CURRENT MEDICATIONS:  Current Outpatient Medications  Medication Sig Dispense Refill   Calcium-Vitamin D ([employer] CALCIUM-600/VIT D PO) Take 1 capsule by mouth daily.     ferrous sulfate 325 (65 FE) MG EC tablet Take 325 mg by mouth daily.     hydroxypropyl methylcellulose  / hypromellose (ISOPTO TEARS / GONIOVISC) 2.5 % ophthalmic solution Place 1 drop into both eyes 3 (three) times daily as needed for dry eyes.      levothyroxine (SYNTHROID, LEVOTHROID) 100 MCG tablet Take 100 mcg by mouth daily before breakfast.      meclizine (ANTIVERT) 25 MG tablet Take 25 mg by mouth daily as needed for dizziness or nausea.     Multiple Vitamins-Minerals (CENTRUM SILVER ULTRA WOMENS) TABS Take 1 tablet by mouth daily.     ondansetron (ZOFRAN ODT) 4 MG disintegrating tablet Take 1 tablet (4 mg total)  by mouth every 8 (eight) hours as needed for nausea or vomiting. 10 tablet 0   oxyCODONE-acetaminophen (PERCOCET) 5-325 MG tablet Take 1 tablet by mouth every 6 (six) hours as needed. 10 tablet 0   simvastatin (ZOCOR) 80 MG tablet Take 80 mg by mouth daily.     No current facility-administered medications for this visit.    ALLERGIES:  Allergies  Allergen Reactions   Chantix [Varenicline]     depression    PHYSICAL EXAM:  Performance status (ECOG): 1 - Symptomatic but completely ambulatory  There were no vitals filed for this visit.  Wt Readings from Last 3 Encounters:  03/20/21 152 lb (68.9 kg)  02/14/21 153 lb (69.4 kg)  01/13/21 155 lb (70.3 kg)   Physical Exam Vitals reviewed.  Constitutional:      Appearance: Normal appearance.  Cardiovascular:     Rate and Rhythm: Normal rate and regular rhythm.     Pulses: Normal pulses.     Heart sounds: Normal heart sounds.  Pulmonary:     Effort: Pulmonary effort is normal.     Breath sounds: Normal breath sounds.  Abdominal:     Palpations: Abdomen is soft. There is no mass.     Tenderness: There is no abdominal tenderness.  Neurological:     General: No focal deficit present.     Mental Status: She is alert and oriented to person, place, and time.  Psychiatric:        Mood and Affect: Mood normal.        Behavior: Behavior normal.     LABORATORY DATA:  I have reviewed the labs as listed.  CBC Latest Ref  Rng & Units 04/03/2021 01/14/2021 11/28/2020  WBC 4.0 - 10.5 K/uL 6.1 12.3(H) 6.7  Hemoglobin 12.0 - 15.0 g/dL 13.4 13.5 14.5  Hematocrit 36.0 - 46.0 % 42.0 40.3 45.5  Platelets 150 - 400 K/uL 259 265 263   CMP Latest Ref Rng & Units 04/03/2021 01/14/2021 11/28/2020  Glucose 70 - 99 mg/dL 90 143(H) 101(H)  BUN 8 - 23 mg/dL 20 20 27(H)  Creatinine 0.44 - 1.00 mg/dL 0.92 0.98 1.16(H)  Sodium 135 - 145 mmol/L 141 140 140  Potassium 3.5 - 5.1 mmol/L 4.3 4.1 4.3  Chloride 98 - 111 mmol/L 106 107 104  CO2 22 - 32 mmol/L 26 23 26   Calcium 8.9 - 10.3 mg/dL 9.2 9.3 9.4  Total Protein 6.5 - 8.1 g/dL 7.5 8.0 8.2(H)  Total Bilirubin 0.3 - 1.2 mg/dL 0.7 0.7 0.7  Alkaline Phos 38 - 126 U/L 68 69 58  AST 15 - 41 U/L 27 33 25  ALT 0 - 44 U/L 24 32 22   Lab Results  Component Value Date   TIBC 342 04/03/2021   TIBC 378 11/28/2020   TIBC 390 09/29/2020   FERRITIN 44 04/03/2021   FERRITIN 38 11/28/2020   FERRITIN 26 09/29/2020   IRONPCTSAT 22 04/03/2021   IRONPCTSAT 29 11/28/2020   IRONPCTSAT 22 09/29/2020    DIAGNOSTIC IMAGING:  I have independently reviewed the scans and discussed with the patient. CT CHEST ABDOMEN PELVIS WO CONTRAST  Result Date: 04/04/2021 CLINICAL DATA:  80 year old female with history of squamous cell carcinoma of the lung status post surgical resection. Additional history of left-sided renal cell carcinoma status post nephrectomy. Follow-up study. EXAM: CT CHEST, ABDOMEN AND PELVIS WITHOUT CONTRAST TECHNIQUE: Multidetector CT imaging of the chest, abdomen and pelvis was performed following the standard protocol without IV contrast. COMPARISON:  CT  the abdomen and pelvis 01/14/2021. Chest CT 09/09/2020. FINDINGS: CT CHEST FINDINGS Cardiovascular: Heart size is normal. There is no significant pericardial fluid, thickening or pericardial calcification. There is aortic atherosclerosis, as well as atherosclerosis of the great vessels of the mediastinum and the coronary arteries,  including calcified atherosclerotic plaque in the left main, left anterior descending, left circumflex and right coronary arteries. Calcifications of the aortic valve. Mediastinum/Nodes: No pathologically enlarged mediastinal or hilar lymph nodes. Please note that accurate exclusion of hilar adenopathy is limited on noncontrast CT scans. Esophagus is unremarkable in appearance. Surgical clips in the left axilla from prior lymph node dissection. No axillary lymphadenopathy. Lungs/Pleura: Status post left upper lobectomy. Compensatory hyperexpansion of the left lower lobe. No suspicious appearing pulmonary nodules or masses are noted. No acute consolidative airspace disease. No pleural effusions. Diffuse bronchial wall thickening with mild to moderate centrilobular and paraseptal emphysema. Musculoskeletal: Status post left modified radical mastectomy and left axillary lymph node dissection. There are no aggressive appearing lytic or blastic lesions noted in the visualized portions of the skeleton. CT ABDOMEN PELVIS FINDINGS Hepatobiliary: Liver has a shrunken appearance and nodular contour, suggesting underlying cirrhosis. No definite cystic or solid hepatic lesions are confidently identified on today's noncontrast CT examination. Large calcified gallstone in the gallbladder measuring up to 1.7 cm in diameter. Gallbladder is nearly completely decompressed, without surrounding inflammatory changes to suggest an acute cholecystitis at this time. Pancreas: No definite pancreatic mass or peripancreatic fluid collections or inflammatory changes are noted on today's noncontrast CT examination. Spleen: Unremarkable. Adrenals/Urinary Tract: Status post left radical nephrectomy. No unexpected soft tissue mass noted in the nephrectomy bed to suggest locally recurrent disease. There is some nodular thickening of the left adrenal gland, stable compared to prior examinations, favored to reflect left adrenal hyperplasia. Right  adrenal gland and right kidney are normal in appearance. No hydroureteronephrosis. Urinary bladder is unremarkable in appearance. Stomach/Bowel: Unenhanced appearance of the stomach is normal. There is no pathologic dilatation of small bowel or colon. A few scattered colonic diverticulae are noted, without surrounding inflammatory changes to suggest an acute diverticulitis at this time. Normal appendix. Vascular/Lymphatic: Aortic atherosclerosis. No lymphadenopathy noted in the abdomen or pelvis. Reproductive: Unenhanced appearance of the uterus and ovaries is unremarkable. Other: Small epigastric ventral hernia. No significant volume of ascites. No pneumoperitoneum. Musculoskeletal: There is a small 1.6 x 1.2 cm low-attenuation collection in the subcutaneous fat of the epigastric region (axial image 73 of series 3) which appears associated with a small ventral hernia which was predominantly only omental fat, favored to represent a trace volume of fluid within this hernia. There are no aggressive appearing lytic or blastic lesions noted in the visualized portions of the skeleton. IMPRESSION: 1. Status post left upper lobectomy, left radical nephrectomy and left modified radical mastectomy. No definitive imaging findings to suggest metastatic disease in the chest, abdomen or pelvis. 2. Morphologic changes in the liver indicative of underlying cirrhosis. 3. Cholelithiasis without evidence of acute cholecystitis. 4. Colonic diverticulosis without evidence of acute diverticulitis at this time. 5. Aortic atherosclerosis, in addition to left main and 3 vessel coronary artery disease. Assessment for potential risk factor modification, dietary therapy or pharmacologic therapy may be warranted, if clinically indicated. 6. Diffuse bronchial wall thickening with mild to moderate centrilobular and paraseptal emphysema; imaging findings suggestive of underlying COPD. 7. There are calcifications of the aortic valve.  Echocardiographic correlation for evaluation of potential valvular dysfunction may be warranted if clinically indicated. 8. Additional incidental findings,  as above. Electronically Signed   By: Vinnie Langton M.D.   On: 04/04/2021 06:32     ASSESSMENT:  1.  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 -Brain MRI on 12/07/2019 with no metastatic disease. -Adjuvant chemotherapy was not offered based on poor performance status and weight loss. -CT chest on 01/08/2020 did not show any evidence of local recurrence left lung.  Trace loculated medial left pleural effusion.  No evidence of metastatic disease in the chest.  Tiny medial right upper lobe 3 mm solid pulmonary nodule decreased in the interval, presumably benign. -CT chest with contrast on 06/21/2020 with no evidence of recurrence or metastatic disease.  3 mm nodule in the medial right chest is unchanged. -CT chest with contrast on 09/09/2020 showed stable postoperative changes from left upper lobectomy with no evidence of recurrence or metastasis.   2.  Normocytic anemia: -Feraheme on 03/25/2020 and 04/01/2020.  3.  Left kidney clear cell renal cell carcinoma: -Left radical nephrectomy on 03/25/2019.  Pathology shows RCC, conventional clear cell type, nuclear grade 2 (5.8 cm).  Tumor invades anterior perinephric fat and segmental renal vein (P T3a), ureteral, vascular normal resection margins are negative.   PLAN:  1.  Stage IIb (T1BN1) squamous cell carcinoma of the left upper lobe of the lung: -She does not have any clinical signs or symptoms of recurrence. -Last CT scan on 04/03/2021 did not show any evidence of recurrence of metastatic disease.  - Recommend repeat CT chest in 6 months.  We will also include abdomen and pelvis scan for monitoring of left kidney cancer by Dr. Lovena Neighbours.   2.  Normocytic anemia: -Reviewed labs from 11/20/2020.  Hemoglobin stable at 13.4.  Ferritin  is stable at 44. -She is tolerating iron tablet daily very well without any side effects. -Continue iron tablet daily. -RTC 3 months with repeat CBC, ferritin and iron panel.   Orders placed this encounter:  No orders of the defined types were placed in this encounter.  Ledell Peoples, MD Department of Hematology/Oncology [place] [facility] Phone: [phone] Pager: [phone] Email: [email or web address]

## 2021-04-11 ENCOUNTER — Other Ambulatory Visit: Payer: Self-pay

## 2021-04-11 ENCOUNTER — Inpatient Hospital Stay (HOSPITAL_COMMUNITY): Payer: Medicare PPO | Admitting: Hematology and Oncology

## 2021-04-11 ENCOUNTER — Encounter (HOSPITAL_COMMUNITY): Payer: Self-pay | Admitting: Hematology and Oncology

## 2021-04-11 VITALS — BP 123/71 | HR 86 | Temp 98.1°F | Resp 20 | Wt 153.8 lb

## 2021-04-11 DIAGNOSIS — C3492 Malignant neoplasm of unspecified part of left bronchus or lung: Secondary | ICD-10-CM | POA: Diagnosis not present

## 2021-04-11 DIAGNOSIS — D509 Iron deficiency anemia, unspecified: Secondary | ICD-10-CM

## 2021-04-11 DIAGNOSIS — C642 Malignant neoplasm of left kidney, except renal pelvis: Secondary | ICD-10-CM | POA: Diagnosis not present

## 2021-04-11 DIAGNOSIS — Z85118 Personal history of other malignant neoplasm of bronchus and lung: Secondary | ICD-10-CM | POA: Diagnosis not present

## 2021-04-11 DIAGNOSIS — C3412 Malignant neoplasm of upper lobe, left bronchus or lung: Secondary | ICD-10-CM | POA: Diagnosis not present

## 2021-04-13 ENCOUNTER — Telehealth: Payer: Self-pay

## 2021-04-13 ENCOUNTER — Other Ambulatory Visit (HOSPITAL_COMMUNITY): Payer: Self-pay

## 2021-04-13 DIAGNOSIS — D509 Iron deficiency anemia, unspecified: Secondary | ICD-10-CM

## 2021-04-13 DIAGNOSIS — C3492 Malignant neoplasm of unspecified part of left bronchus or lung: Secondary | ICD-10-CM

## 2021-04-13 NOTE — Telephone Encounter (Signed)
RN call to check on palliative care patient. Patient reports she is doing ok and has Lind[place] coming regularly to check on her.  She does not wish to continue with palliative appts at this time and will call us or MD if palliative needed in the future. Will disenroll from palliative care.

## 2021-04-16 ENCOUNTER — Ambulatory Visit: Payer: Self-pay | Admitting: Surgery

## 2021-04-16 NOTE — H&P (Signed)
[name] Appointment: 02/17/2021 3:40 PM Location: [facility] Patient #: [id number] DOB: 1941-03-30 Divorced / Language: [name] / Race: White Female   History of Present Illness ([name] A. Shelby Heller MD; 02/17/2021 4:02 PM) Patient words: 80 year old woman with significant medical problems including history of tobacco abuse, COPD, stage IIB squamous cell carcinoma of the lung status post left thoracotomy for lobectomy and lymph node dissection, renal cell carcinoma status post laparoscopic left nephrectomy, breast cancer status post left mastectomy, hypothyroidism.  Her left upper lobectomy was in January 2021 she had significant failure to thrive/dehydration and a very difficult recovery from this.  Not receiving adjuvant chemotherapy or other issues.  Although this was a year and a half ago, according to her daughter the patient is maybe 25% of the person she was; she has really never fully recovered.  About a month ago she had an episode of excruciating right upper quadrant pain associated with nausea and vomiting.  This brought her to the emergency department.  The pain began around lunchtime, and ultimately was able to be controlled with some fentanyl in the ER.  Since then she had another attack requiring oxycodone, and subsequently has had intermittent pain which she has been treating with Tylenol.  Ultrasound demonstrates cholelithiasis with a stone up to 1.7 cm without cholecystitis, common bile duct normal at 3 mm.  Hepatic steatosis noted.  Additionally a CT scan was performed with the same findings.  Most recent chest x-ray was 3 days ago and shows expected changes after her left upper lobectomy.  The patient is a 80 year old female.   Past Surgical History [name], [name]; 02/17/2021 3:21 PM) Cataract Surgery   Bilateral. Lung Surgery   Left. Mastectomy   Left. Nephrectomy   Left.  Diagnostic Studies History [name], [name]; 02/17/2021 3:21 PM) Colonoscopy   >10 years  ago Mammogram   within last year Pap Smear   >5 years ago  Allergies [name], [name]; 02/17/2021 3:23 PM) Chantix *PSYCHOTHERAPEUTIC AND NEUROLOGICAL AGENTS - MISC.*   Allergies Reconciled    Medication History [name], [name]; 02/17/2021 3:24 PM) oxyCODONE-Acetaminophen  (5-325MG  Tablet, Oral) Active. Cephalexin  (500MG  Capsule, Oral) Active. Fluzone High-Dose Quadrivalent  (0.7ML Susp Pref Syr, Intramuscular) Active. Ondansetron  (4MG  Tablet Disint, Oral) Active. Trimethoprim  (100MG  Tablet, Oral) Active. Vascepa  (1GM Capsule, Oral) Active. Medications Reconciled   Social History [name], [name]; 02/17/2021 3:21 PM) Caffeine use   Carbonated beverages. No alcohol use   No drug use   Tobacco use   Former smoker.  Family History [name], [name]; 02/17/2021 3:21 PM) Alcohol Abuse   Father. Arthritis   Mother. Colon Cancer   Brother. Melanoma   Brother. Respiratory Condition   Brother, Father. Thyroid problems   Daughter, Mother.  Pregnancy / Birth History [name], [name]; 02/17/2021 3:21 PM) Age of menopause   >50 Gravida   2 Maternal age   22-20 Para   2  Other Problems [name], [name]; 02/17/2021 3:21 PM) Back Pain   Breast Cancer   Cancer   Cholelithiasis   Lung Cancer   Thyroid Disease      Review of Systems ([name] A. Shelby Heller MD; 02/17/2021 4:00 PM) General Present- Fatigue. Not Present- Appetite Loss, Chills, Fever, Night Sweats, Weight Gain and Weight Loss. Skin Not Present- Change in Wart/Mole, Dryness, Hives, Jaundice, New Lesions, Non-Healing Wounds, Rash and Ulcer. HEENT Present- Hearing Loss and Wears glasses/contact lenses. Not Present- Earache, Hoarseness, Nose Bleed, Oral Ulcers, Ringing  in the Ears, Seasonal Allergies, Sinus Pain, Sore Throat, Visual Disturbances and Yellow Eyes. Respiratory Not Present- Bloody sputum, Chronic Cough, Difficulty Breathing, Snoring and Wheezing. Breast Not Present- Breast Mass, Breast Pain, Nipple Discharge and Skin  Changes. Cardiovascular Present- Shortness of Breath. Not Present- Chest Pain, Difficulty Breathing Lying Down, Leg Cramps, Palpitations, Rapid Heart Rate and Swelling of Extremities. Gastrointestinal Present- Abdominal Pain and Change in Bowel Habits. Not Present- Bloating, Bloody Stool, Chronic diarrhea, Constipation, Difficulty Swallowing, Excessive gas, Gets full quickly at meals, Hemorrhoids, Indigestion, Nausea, Rectal Pain and Vomiting. Female Genitourinary Not Present- Frequency, Nocturia, Painful Urination, Pelvic Pain and Urgency. Musculoskeletal Present- Back Pain. Not Present- Joint Pain, Joint Stiffness, Muscle Pain, Muscle Weakness and Swelling of Extremities. Neurological Present- Decreased Memory. Not Present- Fainting, Headaches, Numbness, Seizures, Tingling, Tremor, Trouble walking and Weakness. Psychiatric Not Present- Anxiety, Bipolar, Change in Sleep Pattern, Depression, Fearful and Frequent crying. Endocrine Not Present- Cold Intolerance, Excessive Hunger, Hair Changes, Heat Intolerance, Hot flashes and New Diabetes. Hematology Not Present- Blood Thinners, Easy Bruising, Excessive bleeding, Gland problems, HIV and Persistent Infections. All other systems negative  Vitals [name] [name]; 02/17/2021 3:24 PM) 02/17/2021 3:24 PM Weight: 152.13 lb   Height: 65 in  Body Surface Area: 1.76 m   Body Mass Index: 25.31 kg/m   Temp.: 99.3 F    Pulse: 106 (Regular)    P.OX: 95% (Room air) BP: 118/78(Sitting, Left Arm, Standard)       Physical Exam ([name] A. Shelby Heller MD; 02/17/2021 4:01 PM) The physical exam findings are as follows: Note:  Alert, chronically ill-appearing Mildly labored respirations with expiratory wheezing Abdomen is soft, nontender, nondistended. Well-healed incisions from previous laparoscopic nephrectomy.    Assessment & Plan ([name] A. Shelby Heller MD; 02/17/2021 4:03 PM) GALLBLADDER CALCULUS (K80.20) Story: She has what sounds like symptomatic biliary colic  versus chronic cholecystitis. A long discussion about options going forward. One option is maintaining a low-fat diet to attempt symptom control, but this will not prevent developing acute cholecystitis or other complications of her gallbladder disease. Although she is exceptionally high risk, I discussed with her that I think the best course of action is to remove her gallbladder. We discussed the relevant anatomy and surgical technique using a diagram to demonstrate. Discussed risks of surgery including bleeding, pain, scarring, intraabdominal injury specifically to the common bile duct and sequelae, bile leak, conversion to open surgery, failure to resolve symptoms, blood clots/ pulmonary embolus, heart attack, pneumonia, stroke, death. Given her debility and other medical issues she understands that her risk of complications is much higher. Questions welcomed and answered to patient's satisfaction. We will refer her to a pulmonologist for assessment of her pulmonary function and optimization. She is not on any treatment for this COPD at this point. If cleared for surgery, we'll tentatively plan to proceed with laparoscopic cholecystectomy.

## 2021-04-16 NOTE — H&P (View-Only) (Signed)
[name] Appointment: 02/17/2021 3:40 PM Location: [facility] Patient #: [id number] DOB: Oct 17, 1940 Divorced / Language: [name] / Race: White Female   History of Present Illness ([name] A. Shelby Heller MD; 02/17/2021 4:02 PM) Patient words: 80 year old woman with significant medical problems including history of tobacco abuse, COPD, stage IIB squamous cell carcinoma of the lung status post left thoracotomy for lobectomy and lymph node dissection, renal cell carcinoma status post laparoscopic left nephrectomy, breast cancer status post left mastectomy, hypothyroidism.  Her left upper lobectomy was in January 2021 she had significant failure to thrive/dehydration and a very difficult recovery from this.  Not receiving adjuvant chemotherapy or other issues.  Although this was a year and a half ago, according to her daughter the patient is maybe 25% of the person she was; she has really never fully recovered.  About a month ago she had an episode of excruciating right upper quadrant pain associated with nausea and vomiting.  This brought her to the emergency department.  The pain began around lunchtime, and ultimately was able to be controlled with some fentanyl in the ER.  Since then she had another attack requiring oxycodone, and subsequently has had intermittent pain which she has been treating with Tylenol.  Ultrasound demonstrates cholelithiasis with a stone up to 1.7 cm without cholecystitis, common bile duct normal at 3 mm.  Hepatic steatosis noted.  Additionally a CT scan was performed with the same findings.  Most recent chest x-ray was 3 days ago and shows expected changes after her left upper lobectomy.  The patient is a 80 year old female.   Past Surgical History [name], [name]; 02/17/2021 3:21 PM) Cataract Surgery   Bilateral. Lung Surgery   Left. Mastectomy   Left. Nephrectomy   Left.  Diagnostic Studies History [name], [name]; 02/17/2021 3:21 PM) Colonoscopy   >10 years  ago Mammogram   within last year Pap Smear   >5 years ago  Allergies [name], [name]; 02/17/2021 3:23 PM) Chantix *PSYCHOTHERAPEUTIC AND NEUROLOGICAL AGENTS - MISC.*   Allergies Reconciled    Medication History [name], [name]; 02/17/2021 3:24 PM) oxyCODONE-Acetaminophen  (5-325MG  Tablet, Oral) Active. Cephalexin  (500MG  Capsule, Oral) Active. Fluzone High-Dose Quadrivalent  (0.7ML Susp Pref Syr, Intramuscular) Active. Ondansetron  (4MG  Tablet Disint, Oral) Active. Trimethoprim  (100MG  Tablet, Oral) Active. Vascepa  (1GM Capsule, Oral) Active. Medications Reconciled   Social History [name], [name]; 02/17/2021 3:21 PM) Caffeine use   Carbonated beverages. No alcohol use   No drug use   Tobacco use   Former smoker.  Family History [name], [place]; 02/17/2021 3:21 PM) Alcohol Abuse   Father. Arthritis   Mother. Colon Cancer   Brother. Melanoma   Brother. Respiratory Condition   Brother, Father. Thyroid problems   Daughter, Mother.  Pregnancy / Birth History [name], [name]; 02/17/2021 3:21 PM) Age of menopause   >59 Gravida   2 Maternal age   67-20 Para   2  Other Problems [name], [name]; 02/17/2021 3:21 PM) Back Pain   Breast Cancer   Cancer   Cholelithiasis   Lung Cancer   Thyroid Disease      Review of Systems ([name] A. Shelby Heller MD; 02/17/2021 4:00 PM) General Present- Fatigue. Not Present- Appetite Loss, Chills, Fever, Night Sweats, Weight Gain and Weight Loss. Skin Not Present- Change in Wart/Mole, Dryness, Hives, Jaundice, New Lesions, Non-Healing Wounds, Rash and Ulcer. HEENT Present- Hearing Loss and Wears glasses/contact lenses. Not Present- Earache, Hoarseness, Nose Bleed, Oral Ulcers, Ringing  in the Ears, Seasonal Allergies, Sinus Pain, Sore Throat, Visual Disturbances and Yellow Eyes. Respiratory Not Present- Bloody sputum, Chronic Cough, Difficulty Breathing, Snoring and Wheezing. Breast Not Present- Breast Mass, Breast Pain, Nipple Discharge and Skin  Changes. Cardiovascular Present- Shortness of Breath. Not Present- Chest Pain, Difficulty Breathing Lying Down, Leg Cramps, Palpitations, Rapid Heart Rate and Swelling of Extremities. Gastrointestinal Present- Abdominal Pain and Change in Bowel Habits. Not Present- Bloating, Bloody Stool, Chronic diarrhea, Constipation, Difficulty Swallowing, Excessive gas, Gets full quickly at meals, Hemorrhoids, Indigestion, Nausea, Rectal Pain and Vomiting. Female Genitourinary Not Present- Frequency, Nocturia, Painful Urination, Pelvic Pain and Urgency. Musculoskeletal Present- Back Pain. Not Present- Joint Pain, Joint Stiffness, Muscle Pain, Muscle Weakness and Swelling of Extremities. Neurological Present- Decreased Memory. Not Present- Fainting, Headaches, Numbness, Seizures, Tingling, Tremor, Trouble walking and Weakness. Psychiatric Not Present- Anxiety, Bipolar, Change in Sleep Pattern, Depression, Fearful and Frequent crying. Endocrine Not Present- Cold Intolerance, Excessive Hunger, Hair Changes, Heat Intolerance, Hot flashes and New Diabetes. Hematology Not Present- Blood Thinners, Easy Bruising, Excessive bleeding, Gland problems, HIV and Persistent Infections. All other systems negative  Vitals [name] [name]; 02/17/2021 3:24 PM) 02/17/2021 3:24 PM Weight: 152.13 lb   Height: 65 in  Body Surface Area: 1.76 m   Body Mass Index: 25.31 kg/m   Temp.: 99.3 F    Pulse: 106 (Regular)    P.OX: 95% (Room air) BP: 118/78(Sitting, Left Arm, Standard)       Physical Exam ([name] A. Shelby Heller MD; 02/17/2021 4:01 PM) The physical exam findings are as follows: Note:  Alert, chronically ill-appearing Mildly labored respirations with expiratory wheezing Abdomen is soft, nontender, nondistended. Well-healed incisions from previous laparoscopic nephrectomy.    Assessment & Plan ([name] A. Shelby Heller MD; 02/17/2021 4:03 PM) GALLBLADDER CALCULUS (K80.20) Story: She has what sounds like symptomatic biliary colic  versus chronic cholecystitis. A long discussion about options going forward. One option is maintaining a low-fat diet to attempt symptom control, but this will not prevent developing acute cholecystitis or other complications of her gallbladder disease. Although she is exceptionally high risk, I discussed with her that I think the best course of action is to remove her gallbladder. We discussed the relevant anatomy and surgical technique using a diagram to demonstrate. Discussed risks of surgery including bleeding, pain, scarring, intraabdominal injury specifically to the common bile duct and sequelae, bile leak, conversion to open surgery, failure to resolve symptoms, blood clots/ pulmonary embolus, heart attack, pneumonia, stroke, death. Given her debility and other medical issues she understands that her risk of complications is much higher. Questions welcomed and answered to patient's satisfaction. We will refer her to a pulmonologist for assessment of her pulmonary function and optimization. She is not on any treatment for this COPD at this point. If cleared for surgery, we'll tentatively plan to proceed with laparoscopic cholecystectomy.

## 2021-04-26 ENCOUNTER — Other Ambulatory Visit: Payer: Self-pay

## 2021-04-26 ENCOUNTER — Ambulatory Visit (HOSPITAL_COMMUNITY)
Admission: RE | Admit: 2021-04-26 | Discharge: 2021-04-26 | Disposition: A | Discharge status: Home / Self Care | Payer: Medicare PPO | Source: Ambulatory Visit | Attending: Family Medicine | Admitting: Family Medicine

## 2021-04-26 DIAGNOSIS — Z1231 Encounter for screening mammogram for malignant neoplasm of breast: Secondary | ICD-10-CM | POA: Diagnosis not present

## 2021-04-27 NOTE — Progress Notes (Signed)
Surgical Instructions   Your procedure is scheduled on Friday 05/05/2021.  Report to [facility] Main Entrance "A" at 06:30 A.M., then check in with the Admitting office.  Call [phone] if you have problems or questions between now and the morning of surgery:   Remember: Do not eat or drink after midnight the night before your surgery  Take these medicines the morning of surgery with A SIP OF WATER  Levothyroxine (Synthroid)  If needed you may take these medications the morning of surgery: Axcetaminophen (Tylenol) Meclizine (Antivert)   As of today, STOP taking any Aspirin (unless otherwise instructed by your surgeon) or Aspirin-containing products; NSAIDS - Aleve, Naproxen, Ibuprofen, Motrin, Advil, Goody's, BC's, all herbal medications, fish oil, and all vitamins.          Do not wear jewelry or makeup Do not wear lotions, powders, perfumes, or deodorant. Do not shave 48 hours prior to surgery.   Do not wear nail polish, gel polish, artificial nails, or any other type of covering on natural nails including fingernails and toenails. If patients have artificial nails, gel coating, etc. that need to be removed by a nail salon please have this removed prior to surgery or surgery may need to be canceled/delayed if the surgeon/ anesthesia feels like the patient is unable to be adequately monitored. Do not bring valuables to the hospital - [facility] is not responsible for any belongings or valuables.              Do NOT Smoke (Tobacco/Vaping) or drink Alcohol 24 hours prior to your procedure  If you use a CPAP at night, you may bring all equipment for your overnight stay.   Contacts, glasses, hearing aids, dentures or partials may not be worn into surgery, please bring cases for these belongings   For patients admitted to the hospital, discharge time will be determined by your treatment team.   Patients discharged the day of surgery will not be allowed to drive home, and someone  needs to stay with them for 24 hours.  ONLY ONE (1) SUPPORT PERSON MAY WAIT IN THE WAITING AREA WHILE YOU ARE IN SURGERY. NO VISITORS WILL BE ALLOWED IN PRE-OP WHERE PATIENTS GET READY FOR SURGERY.  TWO (2) VISITORS WILL BE ALLOWED IN YOUR ROOM IF YOU ARE ADMITTED AFTER SURGERY.  Minor children may have two parents present. Special consideration for safety and communication needs will be reviewed on a case by case basis.  Special instructions:    Oral Hygiene is also important to reduce your risk of infection.  Remember - BRUSH YOUR TEETH THE MORNING OF SURGERY WITH YOUR REGULAR TOOTHPASTE   [place]- Preparing For Surgery  Before surgery, you can play an important role. Because skin is not sterile, your skin needs to be as free of germs as possible. You can reduce the number of germs on your skin by washing with CHG (chlorahexidine gluconate) Soap before surgery.  CHG is an antiseptic cleaner which kills germs and bonds with the skin to continue killing germs even after washing.     Please do not use if you have an allergy to CHG or antibacterial soaps. If your skin becomes reddened/irritated stop using the CHG.  Do not shave (including legs and underarms) for at least 48 hours prior to first CHG shower. It is OK to shave your face.  Please follow these instructions carefully.     Shower the NIGHT BEFORE SURGERY and the MORNING OF SURGERY with CHG Soap.  If you chose to wash your hair, wash your hair first as usual with your normal shampoo. After you shampoo, rinse your hair and body thoroughly to remove the shampoo.    Then [employer] and genitals (private parts) with your normal soap and rinse thoroughly to remove soap.  Next use the CHG Soap as you would any other liquid soap. You can apply CHG directly to the skin and wash gently with a clean washcloth.   Apply the CHG Soap to your body ONLY FROM THE NECK DOWN.  Do not use on open wounds or open sores. Avoid contact with your  eyes, ears, mouth and genitals (private parts). Wash Face and genitals (private parts)  with your normal soap.   Wash thoroughly, paying special attention to the area where your surgery will be performed.  Thoroughly rinse your body with warm water from the neck down.  DO NOT shower/wash with your normal soap after using and rinsing off the CHG Soap.  Pat yourself dry with a CLEAN TOWEL.  Wear CLEAN PAJAMAS to bed the night before surgery  Place CLEAN SHEETS on your bed the night before your surgery  DO NOT SLEEP WITH PETS.   Day of Surgery:  Take a shower with CHG soap. Wear Clean/Comfortable clothing the morning of surgery Do not apply any deodorants/lotions.   Remember to brush your teeth WITH YOUR REGULAR TOOTHPASTE.   Please read over the following fact sheets that you were given.

## 2021-04-28 ENCOUNTER — Other Ambulatory Visit: Payer: Self-pay

## 2021-04-28 ENCOUNTER — Encounter (HOSPITAL_COMMUNITY)
Admission: RE | Admit: 2021-04-28 | Discharge: 2021-04-28 | Disposition: A | Discharge status: Home / Self Care | Payer: Medicare PPO | Source: Ambulatory Visit | Attending: Surgery | Admitting: Surgery

## 2021-04-28 ENCOUNTER — Encounter (HOSPITAL_COMMUNITY): Payer: Self-pay

## 2021-04-28 DIAGNOSIS — Z01812 Encounter for preprocedural laboratory examination: Secondary | ICD-10-CM | POA: Diagnosis not present

## 2021-04-28 HISTORY — DX: Headache, unspecified: R51.9

## 2021-04-28 HISTORY — DX: Thyrotoxicosis, unspecified without thyrotoxic crisis or storm: E05.90

## 2021-04-28 HISTORY — DX: Chronic obstructive pulmonary disease, unspecified: J44.9

## 2021-04-28 LAB — CBC
HCT: 42.7 % (ref 36.0–46.0)
Hemoglobin: 13.4 g/dL (ref 12.0–15.0)
MCH: 30.7 pg (ref 26.0–34.0)
MCHC: 31.4 g/dL (ref 30.0–36.0)
MCV: 97.7 fL (ref 80.0–100.0)
Platelets: 249 10*3/uL (ref 150–400)
RBC: 4.37 MIL/uL (ref 3.87–5.11)
RDW: 12.3 % (ref 11.5–15.5)
WBC: 5.3 10*3/uL (ref 4.0–10.5)
nRBC: 0 % (ref 0.0–0.2)

## 2021-04-28 NOTE — Progress Notes (Signed)
Surgical Instructions   Your procedure is scheduled on Friday 05/05/2021.  Report to [facility] Main Entrance "A" at 06:30 A.M., then check in with the Admitting office.  Call [phone] if you have problems or questions between now and the morning of surgery:   Remember: Do not eat or drink after midnight the night before your surgery  Take these medicines the morning of surgery with A SIP OF WATER  Levothyroxine (Synthroid)  If needed you may take these medications the morning of surgery: Acetaminophen (Tylenol) Meclizine (Antivert)   As of today, STOP taking any Aspirin (unless otherwise instructed by your surgeon) or Aspirin-containing products; NSAIDS - Aleve, Naproxen, Ibuprofen, Motrin, Advil, Goody's, BC's, all herbal medications, fish oil, and all vitamins.          Do not wear jewelry or makeup Do not wear lotions, powders, perfumes, or deodorant. Do not shave 48 hours prior to surgery.   Do not wear nail polish, gel polish, artificial nails, or any other type of covering on natural nails including fingernails and toenails. If patients have artificial nails, gel coating, etc. that need to be removed by a nail salon please have this removed prior to surgery or surgery may need to be canceled/delayed if the surgeon/ anesthesia feels like the patient is unable to be adequately monitored.  Do not bring valuables to the hospital - [facility] is not responsible for any belongings or valuables.              Do NOT Smoke (Tobacco/Vaping) or drink Alcohol 24 hours prior to your procedure  If you use a CPAP at night, you may bring all equipment for your overnight stay.   Contacts, glasses, hearing aids, dentures or partials may not be worn into surgery, please bring cases for these belongings   For patients admitted to the hospital, discharge time will be determined by your treatment team.   Patients discharged the day of surgery will not be allowed to drive home, and someone  needs to stay with them for 24 hours.  ONLY ONE (1) SUPPORT PERSON MAY WAIT IN THE WAITING AREA WHILE YOU ARE IN SURGERY. NO VISITORS WILL BE ALLOWED IN PRE-OP WHERE PATIENTS GET READY FOR SURGERY.  TWO (2) VISITORS WILL BE ALLOWED IN YOUR ROOM IF YOU ARE ADMITTED AFTER SURGERY.  Minor children may have two parents present. Special consideration for safety and communication needs will be reviewed on a case by case basis.  Special instructions:    Oral Hygiene is also important to reduce your risk of infection.  Remember - BRUSH YOUR TEETH THE MORNING OF SURGERY WITH YOUR REGULAR TOOTHPASTE   [place]- Preparing For Surgery  Before surgery, you can play an important role. Because skin is not sterile, your skin needs to be as free of germs as possible. You can reduce the number of germs on your skin by washing with CHG (chlorahexidine gluconate) Soap before surgery.  CHG is an antiseptic cleaner which kills germs and bonds with the skin to continue killing germs even after washing.     Please do not use if you have an allergy to CHG or antibacterial soaps. If your skin becomes reddened/irritated stop using the CHG.  Do not shave (including legs and underarms) for at least 48 hours prior to first CHG shower. It is OK to shave your face.  Please follow these instructions carefully.     Shower the NIGHT BEFORE SURGERY and the MORNING OF SURGERY with CHG  Soap.   If you chose to wash your hair, wash your hair first as usual with your normal shampoo. After you shampoo, rinse your hair and body thoroughly to remove the shampoo.    Then [employer] and genitals (private parts) with your normal soap and rinse thoroughly to remove soap.  Next use the CHG Soap as you would any other liquid soap. You can apply CHG directly to the skin and wash gently with a clean washcloth.   Apply the CHG Soap to your body ONLY FROM THE NECK DOWN.  Do not use on open wounds or open sores. Avoid contact with your  eyes, ears, mouth and genitals (private parts). Wash Face and genitals (private parts)  with your normal soap.   Wash thoroughly, paying special attention to the area where your surgery will be performed.  Thoroughly rinse your body with warm water from the neck down.  DO NOT shower/wash with your normal soap after using and rinsing off the CHG Soap.  Pat yourself dry with a CLEAN TOWEL.  Wear CLEAN PAJAMAS to bed the night before surgery  Place CLEAN SHEETS on your bed the night before your surgery  DO NOT SLEEP WITH PETS.   Day of Surgery:  Take a shower with CHG soap. Wear Clean/Comfortable clothing the morning of surgery Do not apply any deodorants/lotions.   Remember to brush your teeth WITH YOUR REGULAR TOOTHPASTE.   Please read over the following fact sheets that you were given.

## 2021-04-28 NOTE — Progress Notes (Addendum)
PCP - [facility] Cardiologist - Denies  PPM/ICD - Denies  Chest x-ray - 02/14/21 EKG - N/A Stress Test - Denies ECHO - Denies Cardiac Cath - Denies  Sleep Study - Denies  DM- Denies  Blood Thinner Instructions: N/A Aspirin Instructions: N/A  ERAS Protcol - Not ordered PRE-SURGERY Ensure or G2- Not ordered  COVID TEST- N/A; Ambulatory   Anesthesia review: Yes, hx of difficult intubation  Patient denies shortness of breath, fever, cough and chest pain at PAT appointment   All instructions explained to the patient, with a verbal understanding of the material. Patient agrees to go over the instructions while at home for a better understanding. The opportunity to ask questions was provided.

## 2021-05-01 NOTE — Progress Notes (Signed)
Anesthesia Chart Review:  History of lung cancer s/p left upper lobectomy 09/11/2019.  Seen by pulmonologist Dr. Melvyn Novas on 03/20/2021 for preop evaluation.  Per note, "Quit smoking 09/2019  - PFT's  08/10/2019 FEV1 1.93 (88 % ) ratio 0.72  p 7 % improvement from saba p nothing prior to study with DLCO  10.53 (53%) corrects to 2.48 (60%)  for alv volume  -  03/20/2021   Walked RA  approx  450 ft  @ slow to moderate pace  stopped due to  End of study s sob/ sats at end 93%.  She has no cough or limiting sob with no copd on pfts at time she quit smoking so main concern is that she has had LULobectomy with vol loss on that side but intact LLL and nl R chest clinically so should tolerate surgery ok from a pulmonary perspective. My concerns are that she appears to have overall decline greater than expected for her age or lung function and that tolerated surgery 1.5 years ago poorly so my advice is to be sure the benefit (relieving the pain and reducing risk of rupture/sepsis) are high relative to the risk in this pt with atypical GB pain and overall poor apparent reserve. If above is the case, then proceed with surgery w/on additional pulmonary studies as there is no fixed "red line" in pts for non Tsurgery procedures and should have good cough mechanics post op based on absence of copd on previous pfts done w/in a month on quit smoking so risk of atx /mucus obstruction is relatively low."  History of renal cell carcinoma s/p left adrenal sparing nephrectomy 03/30/2019.  Anesthesia procedure note from left upper lobectomy 09/11/2019 was marked as difficult airway.  However, intubation note does not indicate any difficulty.  Previous anesthesia airway note from nephrectomy 03/25/2019 also does not indicate any difficulty was encountered.  Remote history of left mastectomy 1990 for breast cancer.  Preop labs reviewed, WNL.   [facility]yn[facility] Stay Center/Anesthesiology Phone [phone] 05/01/2021 3:14  PM

## 2021-05-01 NOTE — Anesthesia Preprocedure Evaluation (Addendum)
Anesthesia Evaluation  Patient identified by MRN, date of birth, ID band Patient awake    Reviewed: Allergy & Precautions, NPO status , Patient's Chart, lab work & pertinent test results  History of Anesthesia Complications (+) PONV and history of anesthetic complications  Airway Mallampati: II  TM Distance: <3 FB Neck ROM: Full    Dental  (+) Missing,    Pulmonary COPD, Patient abstained from smoking., former smoker,  Hx of lung cancer s/p left upper lobectomy 09/2019   Pulmonary exam normal        Cardiovascular negative cardio ROS Normal cardiovascular exam     Neuro/Psych  Headaches,    GI/Hepatic Neg liver ROS, Biliary colic   Endo/Other  Hypothyroidism   Renal/GU Hx of renal cell carcinoma s/p left nephrectomy 03/2019  negative genitourinary   Musculoskeletal negative musculoskeletal ROS (+)   Abdominal   Peds  Hematology negative hematology ROS (+)   Anesthesia Other Findings Breast ca s/p left mastectomy  Reproductive/Obstetrics negative OB ROS                           Anesthesia Physical Anesthesia Plan  ASA: 3  Anesthesia Plan: General   Post-op Pain Management:    Induction: Intravenous  PONV Risk Score and Plan: 4 or greater and Treatment may vary due to age or medical condition, Ondansetron, Dexamethasone and Propofol infusion  Airway Management Planned: Oral ETT  Additional Equipment: None  Intra-op Plan:   Post-operative Plan: Extubation in OR  Informed Consent: I have reviewed the patients History and Physical, chart, labs and discussed the procedure including the risks, benefits and alternatives for the proposed anesthesia with the patient or authorized representative who has indicated his/her understanding and acceptance.     Dental advisory given  Plan Discussed with: CRNA  Anesthesia Plan Comments: (PAT note by Karoline Caldwell, PA-C: History of lung  cancer s/p left upper lobectomy 09/11/2019.  Seen by pulmonologist Dr. Melvyn Novas on 03/20/2021 for preop evaluation.  Per note, "Quit smoking 09/2019  - PFT's 08/10/2019 FEV1 1.93 (88 % ) ratio 0.72 p 7 % improvement from saba p nothing prior to study with DLCO 10.53 (53%) corrects to 2.48 (60%) for alv volume - 03/20/2021 Walked RA approx 450 ft @ slow to moderate pace stopped due to End of study s sob/ sats at end 93%.  She has no cough or limiting sob with no copd on pfts at time she quit smoking so main concern is that she has had LULobectomy with vol loss on that side but intact LLL and nl R chest clinically so should tolerate surgery ok from a pulmonary perspective. My concerns are that she appears to have overall decline greater than expected for her age or lung function and that tolerated surgery 1.5 years ago poorly so my advice is to be sure the benefit (relieving the pain and reducing risk of rupture/sepsis) are high relative to the risk in this pt with atypical GB pain and overall poor apparent reserve. If above is the case, then proceed with surgery w/on additional pulmonary studies as there is no fixed "red line" in pts for non Tsurgery procedures and should have good cough mechanics post op based on absence of copd on previous pfts done w/in a month on quit smoking so risk of atx /mucus obstruction is relatively low."  Anesthesia procedure note from left upper lobectomy 09/11/2019 was marked as difficult airway.  However, intubation note does not  indicate any difficulty.  Previous anesthesia airway note from nephrectomy 03/25/2019 also does not indicate any difficulty was encountered.    )      Anesthesia Quick Evaluation

## 2021-05-05 ENCOUNTER — Other Ambulatory Visit: Payer: Self-pay

## 2021-05-05 ENCOUNTER — Encounter (HOSPITAL_COMMUNITY)
Admission: RE | Disposition: A | Discharge status: Home / Self Care | Payer: Self-pay | Source: Home / Self Care | Attending: Surgery

## 2021-05-05 ENCOUNTER — Ambulatory Visit (HOSPITAL_COMMUNITY): Payer: Medicare PPO | Admitting: Anesthesiology

## 2021-05-05 ENCOUNTER — Encounter (HOSPITAL_COMMUNITY): Payer: Self-pay | Admitting: Surgery

## 2021-05-05 ENCOUNTER — Ambulatory Visit (HOSPITAL_COMMUNITY)
Admission: RE | Admit: 2021-05-05 | Discharge: 2021-05-05 | Disposition: A | Discharge status: Home / Self Care | Payer: Medicare PPO | Attending: Surgery | Admitting: Surgery

## 2021-05-05 ENCOUNTER — Ambulatory Visit (HOSPITAL_COMMUNITY): Payer: Medicare PPO | Admitting: Physician Assistant

## 2021-05-05 DIAGNOSIS — Z905 Acquired absence of kidney: Secondary | ICD-10-CM | POA: Insufficient documentation

## 2021-05-05 DIAGNOSIS — Z87891 Personal history of nicotine dependence: Secondary | ICD-10-CM | POA: Diagnosis not present

## 2021-05-05 DIAGNOSIS — Z8349 Family history of other endocrine, nutritional and metabolic diseases: Secondary | ICD-10-CM | POA: Insufficient documentation

## 2021-05-05 DIAGNOSIS — Z85528 Personal history of other malignant neoplasm of kidney: Secondary | ICD-10-CM | POA: Insufficient documentation

## 2021-05-05 DIAGNOSIS — Z85118 Personal history of other malignant neoplasm of bronchus and lung: Secondary | ICD-10-CM | POA: Diagnosis not present

## 2021-05-05 DIAGNOSIS — Z853 Personal history of malignant neoplasm of breast: Secondary | ICD-10-CM | POA: Insufficient documentation

## 2021-05-05 DIAGNOSIS — Z800 Family history of malignant neoplasm of digestive organs: Secondary | ICD-10-CM | POA: Insufficient documentation

## 2021-05-05 DIAGNOSIS — J449 Chronic obstructive pulmonary disease, unspecified: Secondary | ICD-10-CM | POA: Insufficient documentation

## 2021-05-05 DIAGNOSIS — K8010 Calculus of gallbladder with chronic cholecystitis without obstruction: Secondary | ICD-10-CM | POA: Diagnosis not present

## 2021-05-05 DIAGNOSIS — Z808 Family history of malignant neoplasm of other organs or systems: Secondary | ICD-10-CM | POA: Insufficient documentation

## 2021-05-05 DIAGNOSIS — Z9012 Acquired absence of left breast and nipple: Secondary | ICD-10-CM | POA: Diagnosis not present

## 2021-05-05 DIAGNOSIS — Z888 Allergy status to other drugs, medicaments and biological substances status: Secondary | ICD-10-CM | POA: Diagnosis not present

## 2021-05-05 DIAGNOSIS — E079 Disorder of thyroid, unspecified: Secondary | ICD-10-CM | POA: Diagnosis not present

## 2021-05-05 HISTORY — PX: CHOLECYSTECTOMY: SHX55

## 2021-05-05 SURGERY — LAPAROSCOPIC CHOLECYSTECTOMY
Anesthesia: General | Site: Abdomen

## 2021-05-05 MED ORDER — DEXAMETHASONE SODIUM PHOSPHATE 10 MG/ML IJ SOLN
INTRAMUSCULAR | Status: DC | PRN
  Administered 2021-05-05: 10 mg via INTRAVENOUS

## 2021-05-05 MED ORDER — PHENYLEPHRINE HCL-NACL 20-0.9 MG/250ML-% IV SOLN
INTRAVENOUS | Status: DC | PRN
  Administered 2021-05-05: 25 ug/min via INTRAVENOUS

## 2021-05-05 MED ORDER — BUPIVACAINE LIPOSOME 1.3 % IJ SUSP
20.0000 mL | Freq: Once | INTRAMUSCULAR | Status: DC
  Filled 2021-05-05: qty 20

## 2021-05-05 MED ORDER — SODIUM CHLORIDE 0.9 % IR SOLN
Status: DC | PRN
  Administered 2021-05-05: 1000 mL

## 2021-05-05 MED ORDER — ACETAMINOPHEN 500 MG PO TABS: 1000.0000 mg | ORAL_TABLET | ORAL | Status: DC

## 2021-05-05 MED ORDER — CEFAZOLIN SODIUM-DEXTROSE 2-4 GM/100ML-% IV SOLN
2.0000 g | INTRAVENOUS | Status: AC
  Administered 2021-05-05: 2 g via INTRAVENOUS
  Filled 2021-05-05: qty 100

## 2021-05-05 MED ORDER — OXYCODONE HCL 5 MG/5ML PO SOLN: 5.0000 mg | Freq: Once | ORAL | Status: DC | PRN

## 2021-05-05 MED ORDER — PROPOFOL 10 MG/ML IV BOLUS
INTRAVENOUS | Status: DC | PRN
  Administered 2021-05-05: 120 mg via INTRAVENOUS

## 2021-05-05 MED ORDER — ROCURONIUM BROMIDE 10 MG/ML (PF) SYRINGE
PREFILLED_SYRINGE | INTRAVENOUS | Status: DC | PRN
  Administered 2021-05-05: 60 mg via INTRAVENOUS

## 2021-05-05 MED ORDER — CHLORHEXIDINE GLUCONATE 0.12 % MT SOLN
15.0000 mL | Freq: Once | OROMUCOSAL | Status: AC
  Administered 2021-05-05: 15 mL via OROMUCOSAL
  Filled 2021-05-05: qty 15

## 2021-05-05 MED ORDER — TRAMADOL HCL 50 MG PO TABS: 50.0000 mg | ORAL_TABLET | Freq: Four times a day (QID) | ORAL | 0 refills | Status: AC | PRN

## 2021-05-05 MED ORDER — FENTANYL CITRATE (PF) 250 MCG/5ML IJ SOLN
INTRAMUSCULAR | Status: DC | PRN
  Administered 2021-05-05 (×3): 50 ug via INTRAVENOUS

## 2021-05-05 MED ORDER — ACETAMINOPHEN 500 MG PO TABS
1000.0000 mg | ORAL_TABLET | Freq: Once | ORAL | Status: AC
  Administered 2021-05-05: 1000 mg via ORAL
  Filled 2021-05-05: qty 2

## 2021-05-05 MED ORDER — ONDANSETRON HCL 4 MG/2ML IJ SOLN
INTRAMUSCULAR | Status: DC | PRN
  Administered 2021-05-05: 4 mg via INTRAVENOUS

## 2021-05-05 MED ORDER — CHLORHEXIDINE GLUCONATE 4 % EX LIQD: 60.0000 mL | Freq: Once | CUTANEOUS | Status: DC

## 2021-05-05 MED ORDER — FENTANYL CITRATE (PF) 250 MCG/5ML IJ SOLN
INTRAMUSCULAR | Status: AC
  Filled 2021-05-05: qty 5

## 2021-05-05 MED ORDER — BUPIVACAINE-EPINEPHRINE 0.25% -1:200000 IJ SOLN
INTRAMUSCULAR | Status: DC | PRN
  Administered 2021-05-05: 18 mL

## 2021-05-05 MED ORDER — OXYCODONE HCL 5 MG PO TABS: 5.0000 mg | ORAL_TABLET | Freq: Once | ORAL | Status: DC | PRN

## 2021-05-05 MED ORDER — LACTATED RINGERS IV SOLN: INTRAVENOUS | Status: DC

## 2021-05-05 MED ORDER — BUPIVACAINE-EPINEPHRINE (PF) 0.25% -1:200000 IJ SOLN
INTRAMUSCULAR | Status: AC
  Filled 2021-05-05: qty 30

## 2021-05-05 MED ORDER — FENTANYL CITRATE (PF) 100 MCG/2ML IJ SOLN: 25.0000 ug | INTRAMUSCULAR | Status: DC | PRN

## 2021-05-05 MED ORDER — 0.9 % SODIUM CHLORIDE (POUR BTL) OPTIME
TOPICAL | Status: DC | PRN
  Administered 2021-05-05: 1000 mL

## 2021-05-05 MED ORDER — PHENYLEPHRINE HCL (PRESSORS) 10 MG/ML IV SOLN
INTRAVENOUS | Status: DC | PRN
  Administered 2021-05-05: 80 ug via INTRAVENOUS

## 2021-05-05 MED ORDER — SUGAMMADEX SODIUM 200 MG/2ML IV SOLN
INTRAVENOUS | Status: DC | PRN
  Administered 2021-05-05: 200 mg via INTRAVENOUS

## 2021-05-05 MED ORDER — DOCUSATE SODIUM 100 MG PO CAPS: 100.0000 mg | ORAL_CAPSULE | Freq: Two times a day (BID) | ORAL | 0 refills | Status: AC

## 2021-05-05 MED ORDER — LIDOCAINE 2% (20 MG/ML) 5 ML SYRINGE
INTRAMUSCULAR | Status: DC | PRN
  Administered 2021-05-05: 80 mg via INTRAVENOUS

## 2021-05-05 MED ORDER — ORAL CARE MOUTH RINSE: 15.0000 mL | Freq: Once | OROMUCOSAL | Status: AC

## 2021-05-05 MED ORDER — PROPOFOL 10 MG/ML IV BOLUS
INTRAVENOUS | Status: AC
  Filled 2021-05-05: qty 20

## 2021-05-05 MED ORDER — PROPOFOL 500 MG/50ML IV EMUL
INTRAVENOUS | Status: DC | PRN
  Administered 2021-05-05: 25 ug/kg/min via INTRAVENOUS

## 2021-05-05 SURGICAL SUPPLY — 41 items
ADH SKN CLS APL DERMABOND .7 (GAUZE/BANDAGES/DRESSINGS) ×1
APL PRP STRL LF DISP 70% ISPRP (MISCELLANEOUS) ×1
APPLIER CLIP 5 13 M/L LIGAMAX5 (MISCELLANEOUS) ×2
APR CLP MED LRG 5 ANG JAW (MISCELLANEOUS) ×1
BAG COUNTER SPONGE SURGICOUNT (BAG) ×2 IMPLANT
BAG SPEC RTRVL LRG 6X4 10 (ENDOMECHANICALS) ×1
BAG SPNG CNTER NS LX DISP (BAG) ×1
CANISTER SUCT 3000ML PPV (MISCELLANEOUS) ×2 IMPLANT
CHLORAPREP W/TINT 26 (MISCELLANEOUS) ×2 IMPLANT
CLIP APPLIE 5 13 M/L LIGAMAX5 (MISCELLANEOUS) ×1 IMPLANT
COVER SURGICAL LIGHT HANDLE (MISCELLANEOUS) ×2 IMPLANT
DERMABOND ADVANCED (GAUZE/BANDAGES/DRESSINGS) ×1
DERMABOND ADVANCED .7 DNX12 (GAUZE/BANDAGES/DRESSINGS) ×1 IMPLANT
ELECT REM PT RETURN 9FT ADLT (ELECTROSURGICAL) ×2
ELECTRODE REM PT RTRN 9FT ADLT (ELECTROSURGICAL) ×1 IMPLANT
ENDOLOOP SUT PDS II  0 18 (SUTURE) ×2
ENDOLOOP SUT PDS II 0 18 (SUTURE) IMPLANT
GAUZE 4X4 16PLY ~~LOC~~+RFID DBL (SPONGE) ×1 IMPLANT
GLOVE SURG ENC MOIS LTX SZ6 (GLOVE) ×2 IMPLANT
GLOVE SURG UNDER LTX SZ6.5 (GLOVE) ×2 IMPLANT
GOWN STRL REUS W/ TWL LRG LVL3 (GOWN DISPOSABLE) ×3 IMPLANT
GOWN STRL REUS W/TWL LRG LVL3 (GOWN DISPOSABLE) ×6
GRASPER SUT TROCAR 14GX15 (MISCELLANEOUS) ×2 IMPLANT
KIT BASIN OR (CUSTOM PROCEDURE TRAY) ×2 IMPLANT
KIT TURNOVER KIT B (KITS) ×2 IMPLANT
NDL INSUFFLATION 14GA 120MM (NEEDLE) ×1 IMPLANT
NEEDLE INSUFFLATION 14GA 120MM (NEEDLE) ×2 IMPLANT
NS IRRIG 1000ML POUR BTL (IV SOLUTION) ×2 IMPLANT
PAD ARMBOARD 7.5X6 YLW CONV (MISCELLANEOUS) ×2 IMPLANT
POUCH SPECIMEN RETRIEVAL 10MM (ENDOMECHANICALS) ×2 IMPLANT
SCISSORS LAP 5X35 DISP (ENDOMECHANICALS) ×2 IMPLANT
SET IRRIG TUBING LAPAROSCOPIC (IRRIGATION / IRRIGATOR) ×2 IMPLANT
SET TUBE SMOKE EVAC HIGH FLOW (TUBING) ×2 IMPLANT
SLEEVE ENDOPATH XCEL 5M (ENDOMECHANICALS) ×4 IMPLANT
SPECIMEN JAR SMALL (MISCELLANEOUS) ×2 IMPLANT
SUT MNCRL AB 4-0 PS2 18 (SUTURE) ×2 IMPLANT
TOWEL GREEN STERILE (TOWEL DISPOSABLE) ×1 IMPLANT
TRAY LAPAROSCOPIC MC (CUSTOM PROCEDURE TRAY) ×2 IMPLANT
TROCAR XCEL NON-BLD 11X100MML (ENDOMECHANICALS) ×2 IMPLANT
TROCAR XCEL NON-BLD 5MMX100MML (ENDOMECHANICALS) ×2 IMPLANT
WATER STERILE IRR 1000ML POUR (IV SOLUTION) ×2 IMPLANT

## 2021-05-05 NOTE — Anesthesia Postprocedure Evaluation (Signed)
Anesthesia Post Note  Patient: [name]  Procedure(s) Performed: LAPAROSCOPIC CHOLECYSTECTOMY (Abdomen)     Patient location during evaluation: PACU Anesthesia Type: General Level of consciousness: awake and alert and oriented Pain management: pain level controlled Vital Signs Assessment: post-procedure vital signs reviewed and stable Respiratory status: spontaneous breathing, nonlabored ventilation and respiratory function stable Cardiovascular status: blood pressure returned to baseline Postop Assessment: no apparent nausea or vomiting Anesthetic complications: no   No notable events documented.  Last Vitals:  Vitals:   05/05/21 0905 05/05/21 0920  BP: (!) 150/72   Pulse: 74   Resp: 13   Temp:  36.6 C  SpO2: 97%     Last Pain:  Vitals:   05/05/21 0920  TempSrc:   PainSc: 0-No pain                 Marthenia Rolling

## 2021-05-05 NOTE — Op Note (Signed)
Operative Note  [name] 80 y.o. female [id number]  05/05/2021  Surgeon: Clovis Riley MD FACS  Assistant: Michaelle Birks MD  Procedure performed: Laparoscopic Cholecystectomy  Preop diagnosis: biliary colic Post-op diagnosis/intraop findings: same  Specimens: gallbladder  Retained items: none  EBL: minimal  Complications: none  Description of procedure: After obtaining informed consent the patient was brought to the operating room. Antibiotics were administered. SCD's were applied. General endotracheal anesthesia was initiated and a formal time-out was performed. The abdomen was prepped and draped in the usual sterile fashion and the abdomen was entered using an infraumbilical veress needle after instilling the site with local. Insufflation to 35mmHg was obtained, 67mm trocar and camera inserted, and gross inspection revealed no evidence of injury from our entry or other intraabdominal abnormalities. Two 49mm trocars were introduced in the right midclavicular and right anterior axillary lines under direct visualization and following infiltration with local. An 94mm trocar was placed in the epigastrium. The gallbladder was retracted cephalad and the infundibulum was retracted laterally. A combination of hook electrocautery and blunt dissection was utilized to clear the peritoneum from the neck and cystic duct, circumferentially isolating the cystic artery and cystic duct and lifting the gallbladder from the cystic plate. The critical view of safety was achieved with the cystic artery, cystic duct, and liver bed visualized between them with no other structures. The artery was somewhat large but clearly entering the gallbladder, this was clipped with 2 clips proximally and 1 distally and divided as was the cystic duct with three clips on the proximal end. The gallbladder was dissected from the liver plate using electrocautery. Once freed the gallbladder was placed in an endocatch bag and  removed intact through the epigastric trocar site. Hemostasis was once again confirmed, and reinspection of the abdomen revealed no injuries. There was no bile leak from the duct or the liver bed.  The cystic duct was somewhat thickened, and while the clips were closed and across, the tips of the clips were not touching, so I elected to place a PDS Endoloop on the cystic duct just proximal to the most proximal clip. The 58mm trocar site in the epigastrium was closed with a 0 vicryl in the fascia under direct visualization using a PMI device. The abdomen was desufflated and all trocars removed. The skin incisions were closed with subcuticular 4-0 monocryl and Dermabond. The patient was awakened, extubated and transported to the recovery room in stable condition.    All counts were correct at the completion of the case.

## 2021-05-05 NOTE — Anesthesia Procedure Notes (Signed)
Procedure Name: Intubation Date/Time: 05/05/2021 7:42 AM Performed by: Clearnce Sorrel, CRNA Pre-anesthesia Checklist: Patient identified, Emergency Drugs available, Suction available and Patient being monitored Patient Re-evaluated:Patient Re-evaluated prior to induction Oxygen Delivery Method: Circle System Utilized Preoxygenation: Pre-oxygenation with 100% oxygen Induction Type: IV induction Ventilation: Mask ventilation without difficulty Laryngoscope Size: Mac and 3 Grade View: Grade I Tube type: Oral Tube size: 7.0 mm Number of attempts: 1 Airway Equipment and Method: Stylet and Oral airway Placement Confirmation: ETT inserted through vocal cords under direct vision, positive ETCO2 and breath sounds checked- equal and bilateral Secured at: 22 cm Tube secured with: Tape Dental Injury: Teeth and Oropharynx as per pre-operative assessment

## 2021-05-05 NOTE — Interval H&P Note (Signed)
History and Physical Interval Note:  05/05/2021 6:57 AM  [name]  has presented today for surgery, with the diagnosis of BILIARY COLIC.  The various methods of treatment have been discussed with the patient and family. After consideration of risks, benefits and other options for treatment, the patient has consented to  Procedure(s): LAPAROSCOPIC CHOLECYSTECTOMY (N/A) as a surgical intervention.  The patient's history has been reviewed, patient examined, no change in status, stable for surgery.  I have reviewed the patient's chart and labs.  Questions were answered to the patient's satisfaction.     [name]

## 2021-05-05 NOTE — Transfer of Care (Signed)
Immediate Anesthesia Transfer of Care Note  Patient: [name]  Procedure(s) Performed: LAPAROSCOPIC CHOLECYSTECTOMY (Abdomen)  Patient Location: PACU  Anesthesia Type:General  Level of Consciousness: awake, alert  and oriented  Airway & Oxygen Therapy: Patient Spontanous Breathing  Post-op Assessment: Report given to RN and Post -op Vital signs reviewed and stable  Post vital signs: Reviewed and stable  Last Vitals:  Vitals Value Taken Time  BP 147/68 05/05/21 0847  Temp    Pulse 79 05/05/21 0851  Resp 21 05/05/21 0851  SpO2 94 % 05/05/21 0851  Vitals shown include unvalidated device data.  Last Pain:  Vitals:   05/05/21 0553  TempSrc: Oral         Complications: No notable events documented.

## 2021-05-05 NOTE — Discharge Instructions (Signed)
LAPAROSCOPIC SURGERY: POST OP INSTRUCTIONS   EAT Gradually transition to a high fiber diet with a fiber supplement over the next few weeks after discharge.  Start with a pureed / full liquid diet (see below)  WALK Walk an hour a day.  Control your pain to do that.    CONTROL PAIN Control pain so that you can walk, sleep, tolerate sneezing/coughing, go up/down stairs.  HAVE A BOWEL MOVEMENT DAILY Keep your bowels regular to avoid problems.  OK to try a laxative to override constipation.  OK to use an antidairrheal to slow down diarrhea.  Call if not better after 2 tries  CALL IF YOU HAVE PROBLEMS/CONCERNS Call if you are still struggling despite following these instructions. Call if you have concerns not answered by these instructions    DIET: Follow a light bland diet & liquids the first 24 hours after arrival home, such as soup, liquids, starches, etc.  Be sure to drink plenty of fluids.  Quickly advance to a usual solid diet within a few days.  Avoid fast food or heavy meals as your are more likely to get nauseated or have irregular bowels.  A low-sugar, high-fiber diet for the rest of your life is ideal.  Take your usually prescribed home medications unless otherwise directed.  PAIN CONTROL: Pain is best controlled by a usual combination of three different methods TOGETHER: Ice/Heat Over the counter pain medication Prescription pain medication Most patients will experience some swelling and bruising around the incisions.  Ice packs or heating pads (30-60 minutes up to 6 times a day) will help. Use ice for the first few days to help decrease swelling and bruising, then switch to heat to help relax tight/sore spots and speed recovery.  Some people prefer to use ice alone, heat alone, alternating between ice & heat.  Experiment to what works for you.  Swelling and bruising can take several weeks to resolve.   It is helpful to take an over-the-counter pain medication regularly for the  first few days: Acetaminophen (Tylenol, etc) 500-650mg  four times a day (every meal & bedtime) A  prescription for pain medication (such as oxycodone, hydrocodone, tramadol, gabapentin, methocarbamol, etc) should be given to you upon discharge.  Take your pain medication as prescribed, IF NEEDED.  If you are having problems/concerns with the prescription medicine (does not control pain, nausea, vomiting, rash, itching, etc), please call us [phone] to see if we need to switch you to a different pain medicine that will work better for you and/or control your side effect better. If you need a refill on your pain medication, please give [place] 48 hour notice.  contact your pharmacy.  They will contact our office to request authorization. Prescriptions will not be filled after 5 pm or on week-ends  Avoid getting constipated.   Between the surgery and the pain medications, it is common to experience some constipation.   Increasing fluid intake and taking a fiber supplement (such as Metamucil, Citrucel, FiberCon, MiraLax, etc) 1-2 times a day regularly will usually help prevent this problem from occurring.   A mild laxative (prune juice, Milk of Magnesia, MiraLax, etc) should be taken according to package directions if there are no bowel movements after 48 hours.   Watch out for diarrhea.   If you have many loose bowel movements, simplify your diet to bland foods & liquids for a few days.   Stop any stool softeners and decrease your fiber supplement.   Switching to mild anti-diarrheal  medications (Kayopectate, Pepto Bismol) can help.   If this worsens or does not improve, please call us.  Wash / shower every day.  You may shower over the skin glue which is waterproof  Glue will flake off after about 2 weeks.  You may leave the incision open to air.  You may replace a dressing/Band-Aid to cover the incision for comfort if you wish.   ACTIVITIES as tolerated:   You may resume regular (light) daily  activities beginning the next day--such as daily self-care, walking, climbing stairs--gradually increasing activities as tolerated.  If you can walk 30 minutes without difficulty, it is safe to try more intense activity such as jogging, treadmill, bicycling, low-impact aerobics, swimming, etc. Save the most intensive and strenuous activity for last such as sit-ups, heavy lifting, contact sports, etc  Refrain from any heavy lifting or straining until you are off narcotics for pain control.   DO NOT PUSH THROUGH PAIN.  Let pain be your guide: If it hurts to do something, don't do it.  Pain is your body warning you to avoid that activity for another week until the pain goes down. You may drive when you are no longer taking prescription pain medication, you can comfortably wear a seatbelt, and you can safely maneuver your car and apply brakes. You may have sexual intercourse when it is comfortable.  FOLLOW UP in our office Please call [facility] at [phone] to set up an appointment to see your surgeon in the office for a follow-up appointment approximately 2-3 weeks after your surgery. Make sure that you call for this appointment the day you arrive home to insure a convenient appointment time.  10. IF YOU HAVE DISABILITY OR FAMILY LEAVE FORMS, BRING THEM TO THE OFFICE FOR PROCESSING.  DO NOT GIVE THEM TO YOUR DOCTOR.   WHEN TO CALL us [phone]: Poor pain control Reactions / problems with new medications (rash/itching, nausea, etc)  Fever over 101.5 F (38.5 C) Inability to urinate Nausea and/or vomiting Worsening swelling or bruising Continued bleeding from incision. Increased pain, redness, or drainage from the incision   The clinic staff is available to answer your questions during regular business hours (8:30am-5pm).  Please don't hesitate to call and ask to speak to one of our nurses for clinical concerns.   If you have a medical emergency, go to the nearest emergency room or call  911.  A surgeon from [facility] is always on call at the [facility], [place], [place], [place], [place]  [id number] ? MAIN: [phone] ? TOLL FREE: [phone] ?  FAX [phone] [email or web address]

## 2021-05-06 ENCOUNTER — Encounter (HOSPITAL_COMMUNITY): Payer: Self-pay | Admitting: Surgery

## 2021-05-16 LAB — SURGICAL PATHOLOGY

## 2021-07-11 ENCOUNTER — Other Ambulatory Visit: Payer: Self-pay

## 2021-07-11 ENCOUNTER — Inpatient Hospital Stay (HOSPITAL_COMMUNITY): Payer: Medicare PPO | Attending: Hematology

## 2021-07-11 DIAGNOSIS — Z85528 Personal history of other malignant neoplasm of kidney: Secondary | ICD-10-CM | POA: Diagnosis present

## 2021-07-11 DIAGNOSIS — Z85118 Personal history of other malignant neoplasm of bronchus and lung: Secondary | ICD-10-CM | POA: Insufficient documentation

## 2021-07-11 DIAGNOSIS — Z9012 Acquired absence of left breast and nipple: Secondary | ICD-10-CM | POA: Insufficient documentation

## 2021-07-11 DIAGNOSIS — Z853 Personal history of malignant neoplasm of breast: Secondary | ICD-10-CM | POA: Diagnosis present

## 2021-07-11 DIAGNOSIS — Z79899 Other long term (current) drug therapy: Secondary | ICD-10-CM | POA: Insufficient documentation

## 2021-07-11 DIAGNOSIS — D509 Iron deficiency anemia, unspecified: Secondary | ICD-10-CM

## 2021-07-11 DIAGNOSIS — D649 Anemia, unspecified: Secondary | ICD-10-CM | POA: Diagnosis not present

## 2021-07-11 DIAGNOSIS — C3492 Malignant neoplasm of unspecified part of left bronchus or lung: Secondary | ICD-10-CM

## 2021-07-11 DIAGNOSIS — Z905 Acquired absence of kidney: Secondary | ICD-10-CM | POA: Insufficient documentation

## 2021-07-11 LAB — COMPREHENSIVE METABOLIC PANEL
ALT: 20 U/L (ref 0–44)
AST: 26 U/L (ref 15–41)
Albumin: 4.7 g/dL (ref 3.5–5.0)
Alkaline Phosphatase: 82 U/L (ref 38–126)
Anion gap: 5 (ref 5–15)
BUN: 14 mg/dL (ref 8–23)
CO2: 29 mmol/L (ref 22–32)
Calcium: 9.7 mg/dL (ref 8.9–10.3)
Chloride: 106 mmol/L (ref 98–111)
Creatinine, Ser: 0.93 mg/dL (ref 0.44–1.00)
GFR, Estimated: >60 mL/min (ref >60)
Glucose, Bld: 98 mg/dL (ref 70–99)
Potassium: 4.6 mmol/L (ref 3.5–5.1)
Sodium: 140 mmol/L (ref 135–145)
Total Bilirubin: 0.9 mg/dL (ref 0.3–1.2)
Total Protein: 8 g/dL (ref 6.5–8.1)

## 2021-07-11 LAB — IRON AND TIBC
Iron: 92 ug/dL (ref 28–170)
Saturation Ratios: 25 % (ref 10.4–31.8)
TIBC: 375 ug/dL (ref 250–450)
UIBC: 283 ug/dL

## 2021-07-11 LAB — CBC WITH DIFFERENTIAL/PLATELET
Abs Immature Granulocytes: 0.02 10*3/uL (ref 0.00–0.07)
Basophils Absolute: 0.1 10*3/uL (ref 0.0–0.1)
Basophils Relative: 1 %
Eosinophils Absolute: 0.2 10*3/uL (ref 0.0–0.5)
Eosinophils Relative: 4 %
HCT: 46 % (ref 36.0–46.0)
Hemoglobin: 14.7 g/dL (ref 12.0–15.0)
Immature Granulocytes: 0 %
Lymphocytes Relative: 26 %
Lymphs Abs: 1.4 10*3/uL (ref 0.7–4.0)
MCH: 31.1 pg (ref 26.0–34.0)
MCHC: 32 g/dL (ref 30.0–36.0)
MCV: 97.5 fL (ref 80.0–100.0)
Monocytes Absolute: 0.5 10*3/uL (ref 0.1–1.0)
Monocytes Relative: 9 %
Neutro Abs: 3.3 10*3/uL (ref 1.7–7.7)
Neutrophils Relative %: 60 %
Platelets: 255 10*3/uL (ref 150–400)
RBC: 4.72 MIL/uL (ref 3.87–5.11)
RDW: 12.9 % (ref 11.5–15.5)
WBC: 5.5 10*3/uL (ref 4.0–10.5)
nRBC: 0 % (ref 0.0–0.2)

## 2021-07-11 LAB — FERRITIN: Ferritin: 53 ng/mL (ref 11–307)

## 2021-07-17 NOTE — Progress Notes (Signed)
[name], [place] [id number]   CLINIC:  Medical Oncology/Hematology  PCP:  Alanson Puls, [facility]. / [place] [address] [phone]   REASON FOR VISIT:  Follow-up for squamous cell carcinoma of left lung and IDA  PRIOR THERAPY:  1. Left upper lobe lobectomy on 09/11/2019. 2. Intermittent Feraheme last on 04/01/2020.  NGS Results: not done  CURRENT THERAPY: Iron tablets daily  BRIEF ONCOLOGIC HISTORY:  Oncology History  Clear cell renal cell carcinoma, left ([place])  04/13/2019 Initial Diagnosis   Clear cell renal cell carcinoma, left ([place])   04/13/2019 Cancer Staging   Staging form: Kidney, AJCC 8th Edition - Clinical: Stage III (cT3a, cNX, cM0) - Signed by Derek Jack, MD on 04/13/2019    Lung cancer ([place])  09/11/2019 Initial Diagnosis   Lung cancer ([place])   10/26/2019 Cancer Staging   Staging form: Lung, AJCC 8th Edition - Clinical stage from 10/26/2019: Stage IIB (cT1b, cN1, cM0) - Signed by Derek Jack, MD on 10/26/2019      CANCER STAGING: Cancer Staging Adenocarcinoma of left breast Staging form: Breast, AJCC 7th Edition - Clinical: Stage IIA (T2, N0, cM0) - Signed by Baird Cancer, PA on 04/16/2012  Clear cell renal cell carcinoma, left ([facility]) Staging form: Kidney, AJCC 8th Edition - Clinical: Stage III (cT3a, cNX, cM0) - Signed by Derek Jack, MD on 04/13/2019  Lung cancer [facility]) Staging form: Lung, AJCC 8th Edition - Clinical stage from 10/26/2019: Stage IIB (cT1b, cN1, cM0) - Signed by Derek Jack, MD on 10/26/2019   INTERVAL HISTORY:  [name], a 80 y.o. female, returns for routine follow-up of her squamous cell carcinoma of left lung and IDA. [name] was last seen on 12/05/2020.   Today she reports feeling good, and she is accompanied by her daughter. Her daughter reports she has had no recent falls. She reports occasional diarrhea. Her appetite is good,  and she is able to do all of her typical home activities. She had abdominal pain prior to her cholecystectomy on 05/05/2021; this pain has not recurred following surgery.   REVIEW OF SYSTEMS:  Review of Systems  Constitutional:  Negative for appetite change and fatigue (75%).  Cardiovascular:  Positive for leg swelling (ankles).  Gastrointestinal:  Positive for diarrhea (occasional). Negative for abdominal pain.  Neurological:  Positive for dizziness.  All other systems reviewed and are negative.  PAST MEDICAL/SURGICAL HISTORY:  Past Medical History:  Diagnosis Date   Adenocarcinoma of left breast 04/16/2012   Stage II (T2 N0 M0) left-sided adenocarcinoma breast diagnosed in 1990 by Dr. Romona Curls and treated by Dr. Juanita Craver on NSAB PPD-19 with CMF followed by 5 years of tamoxifen and she remains disease free thus far.    Breast cancer ([place])    left breast/mastectomy/dx approx1990   COPD (chronic obstructive pulmonary disease) (H[facility])    Gall bladder stones    Grave's disease    Headache    Migraines   High triglycerides    History of kidney stones    small stone  rt    Hyperthyroidism    Nerve compression    pinched in neck   Pneumonia 1990   PONV (postoperative nausea and vomiting)    Past Surgical History:  Procedure Laterality Date   CARPAL TUNNEL RELEASE     rt. hand   cataract surgery     bilateral   CHOLECYSTECTOMY N/A 05/05/2021   Procedure: LAPAROSCOPIC CHOLECYSTECTOMY;  Surgeon: Clovis Riley, MD;  Location: [facility];  Service: General;  Laterality: N/A;   COLONOSCOPY     EYE SURGERY     LAPAROSCOPIC NEPHRECTOMY Left 03/25/2019   Procedure: LAPAROSCOPIC RADICAL NEPHRECTOMY;  Surgeon: Ceasar Mons, MD;  Location: [facility];  Service: Urology;  Laterality: Left;   MASTECTOMY     left.   NEPHRECTOMY Left    NODE DISSECTION Left 09/11/2019   Procedure: Node Dissection;  Surgeon: [name], MD;  Location: [place] [facility];  Service: Thoracic;  Laterality:  Left;   THORACOTOMY/LOBECTOMY Left 09/11/2019   Procedure: Thoracotomy/Lobectomy Left Upper Lobe;  Surgeon: [name], MD;  [facility];  Service: Thoracic;  Laterality: Left;   TONSILLECTOMY      SOCIAL HISTORY:  Social History   Socioeconomic History   Marital status: Divorced    Spouse name: Not on file   Number of children: Not on file   Years of education: Not on file   Highest education level: Not on file  Occupational History   Not on file  Tobacco Use   Smoking status: Former    Packs/day: 1.00    Years: 60.00    Pack years: 60.00    Types: Cigarettes    Quit date: 09/22/2019    Years since quitting: 1.8   Smokeless tobacco: Never   Tobacco comments:    attempted to stop but its expensive to quit  Vaping Use   Vaping Use: Never used  Substance and Sexual Activity   Alcohol use: No   Drug use: No   Sexual activity: Not on file  Other Topics Concern   Not on file  Social History Narrative   Not on file   Social Determinants of Health   Financial Resource Strain: Not on file  Food Insecurity: Not on file  Transportation Needs: Not on file  Physical Activity: Not on file  Stress: Not on file  Social Connections: Not on file  Intimate Partner Violence: Not At Risk   Fear of Current or Ex-Partner: No   Emotionally Abused: No   Physically Abused: No   Sexually Abused: No    FAMILY HISTORY:  Family History  Problem Relation Age of Onset   Cancer Father        lung cancer   Cancer Brother        colon cancer   Cancer Other        lung cancer/died age 48    CURRENT MEDICATIONS:  Current Outpatient Medications  Medication Sig Dispense Refill   acetaminophen (TYLENOL) 500 MG tablet Take 500 mg by mouth every 8 (eight) hours as needed for moderate pain.     Calcium-Vitamin D ([employer] CALCIUM-600/VIT D PO) Take 1 capsule by mouth in the morning and at bedtime.     ferrous sulfate 325 (65 FE) MG EC tablet Take 325 mg by mouth daily.      hydroxypropyl methylcellulose / hypromellose (ISOPTO TEARS / GONIOVISC) 2.5 % ophthalmic solution Place 1 drop into both eyes 3 (three) times daily as needed for dry eyes.      levothyroxine (SYNTHROID, LEVOTHROID) 100 MCG tablet Take 100 mcg by mouth daily before breakfast.      meclizine (ANTIVERT) 25 MG tablet Take 25 mg by mouth daily as needed for dizziness or nausea.     Multiple Vitamins-Minerals (CENTRUM SILVER ULTRA WOMENS) TABS Take 1 tablet by mouth daily.     simvastatin (ZOCOR) 80 MG tablet Take 80 mg  by mouth at bedtime.     No current facility-administered medications for this visit.    ALLERGIES:  Allergies  Allergen Reactions   Chantix [Varenicline]     depression    PHYSICAL EXAM:  Performance status (ECOG): 1 - Symptomatic but completely ambulatory  There were no vitals filed for this visit. Wt Readings from Last 3 Encounters:  05/05/21 153 lb 10.6 oz (69.7 kg)  04/28/21 153 lb 9.6 oz (69.7 kg)  04/11/21 153 lb 12.8 oz (69.8 kg)   Physical Exam Vitals reviewed.  Constitutional:      Appearance: Normal appearance.  Cardiovascular:     Rate and Rhythm: Normal rate and regular rhythm.     Pulses: Normal pulses.     Heart sounds: Normal heart sounds.  Pulmonary:     Effort: Pulmonary effort is normal.     Breath sounds: Normal breath sounds.  Neurological:     General: No focal deficit present.     Mental Status: She is alert and oriented to person, place, and time.  Psychiatric:        Mood and Affect: Mood normal.        Behavior: Behavior normal.     LABORATORY DATA:  I have reviewed the labs as listed.  CBC Latest Ref Rng & Units 07/11/2021 04/28/2021 04/03/2021  WBC 4.0 - 10.5 K/uL 5.5 5.3 6.1  Hemoglobin 12.0 - 15.0 g/dL 14.7 13.4 13.4  Hematocrit 36.0 - 46.0 % 46.0 42.7 42.0  Platelets 150 - 400 K/uL 255 249 259   CMP Latest Ref Rng & Units 07/11/2021 04/03/2021 01/14/2021  Glucose 70 - 99 mg/dL 98 90 143(H)  BUN 8 - 23 mg/dL 14 20 20   Creatinine  0.44 - 1.00 mg/dL 0.93 0.92 0.98  Sodium 135 - 145 mmol/L 140 141 140  Potassium 3.5 - 5.1 mmol/L 4.6 4.3 4.1  Chloride 98 - 111 mmol/L 106 106 107  CO2 22 - 32 mmol/L 29 26 23   Calcium 8.9 - 10.3 mg/dL 9.7 9.2 9.3  Total Protein 6.5 - 8.1 g/dL 8.0 7.5 8.0  Total Bilirubin 0.3 - 1.2 mg/dL 0.9 0.7 0.7  Alkaline Phos 38 - 126 U/L 82 68 69  AST 15 - 41 U/L 26 27 33  ALT 0 - 44 U/L 20 24 32    DIAGNOSTIC IMAGING:  I have independently reviewed the scans and discussed with the patient. No results found.   ASSESSMENT:  1.  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 -Brain MRI on 12/07/2019 with no metastatic disease. -Adjuvant chemotherapy was not offered based on poor performance status and weight loss. -CT chest on 01/08/2020 did not show any evidence of local recurrence left lung.  Trace loculated medial left pleural effusion.  No evidence of metastatic disease in the chest.  Tiny medial right upper lobe 3 mm solid pulmonary nodule decreased in the interval, presumably benign. -CT chest with contrast on 06/21/2020 with no evidence of recurrence or metastatic disease.  3 mm nodule in the medial right chest is unchanged. -CT chest with contrast on 09/09/2020 showed stable postoperative changes from left upper lobectomy with no evidence of recurrence or metastasis.   2.  Normocytic anemia: -Feraheme on 03/25/2020 and 04/01/2020.   3.  Left kidney clear cell renal cell carcinoma: -Left radical nephrectomy on 03/25/2019.  Pathology shows RCC, conventional clear cell type, nuclear grade 2 (5.8 cm).  Tumor invades anterior perinephric  fat and segmental renal vein (P T3a), ureteral, vascular normal resection margins are negative.   PLAN:  1.  Stage IIb (T1BN1) squamous cell carcinoma of the left upper lobe of the lung: - CT CAP on 04/03/2021 did not show any evidence of metastatic disease in the chest, abdomen or pelvis. - He  underwent cholecystectomy in the interim.  Abdominal pain improved. - Plan to repeat CT scan of the chest without contrast in 6 months.   2.  Normocytic anemia: - Continue iron tablet daily.  Denies any bleeding per rectum.  Occasional diarrhea is stable. - Reviewed labs from 07/11/2021.  Ferritin is stable at 53.  Hemoglobin normal at 14.7.   Orders placed this encounter:  No orders of the defined types were placed in this encounter.    Derek Jack, MD [place] [phone]   I, Thana Ates, am acting as a scribe for Dr. Derek Jack.  I, Derek Jack MD, have reviewed the above documentation for accuracy and completeness, and I agree with the above.

## 2021-07-18 ENCOUNTER — Other Ambulatory Visit: Payer: Self-pay

## 2021-07-18 ENCOUNTER — Encounter (HOSPITAL_COMMUNITY): Payer: Self-pay | Admitting: Hematology

## 2021-07-18 ENCOUNTER — Inpatient Hospital Stay (HOSPITAL_COMMUNITY): Payer: Medicare PPO | Admitting: Hematology

## 2021-07-18 VITALS — BP 137/74 | HR 99 | Temp 98.1°F | Resp 20 | Wt 152.5 lb

## 2021-07-18 DIAGNOSIS — C3492 Malignant neoplasm of unspecified part of left bronchus or lung: Secondary | ICD-10-CM

## 2021-07-18 DIAGNOSIS — D509 Iron deficiency anemia, unspecified: Secondary | ICD-10-CM | POA: Diagnosis not present

## 2021-07-18 DIAGNOSIS — Z85118 Personal history of other malignant neoplasm of bronchus and lung: Secondary | ICD-10-CM | POA: Diagnosis not present

## 2021-07-18 DIAGNOSIS — C642 Malignant neoplasm of left kidney, except renal pelvis: Secondary | ICD-10-CM

## 2021-07-18 NOTE — Patient Instructions (Signed)
[place] at [facility] Discharge Instructions  You were seen and examined today by Dr. Delton Coombes. He reviewed your lab work which was normal. We will arrange for you to have a CT scan of your chest prior to next visit. Return as scheduled in February for labs, CT scan, and office visit.     Thank you for choosing [place] at [facility] to provide your oncology and hematology care.  To afford each patient quality time with our provider, please arrive at least 15 minutes before your scheduled appointment time.   If you have a lab appointment with the [facility] please come in thru the Main Entrance and check in at the main information desk.  You need to re-schedule your appointment should you arrive 10 or more minutes late.  We strive to give you quality time with our providers, and arriving late affects you and other patients whose appointments are after yours.  Also, if you no show three or more times for appointments you may be dismissed from the clinic at the providers discretion.     Again, thank you for choosing [facility].  Our hope is that these requests will decrease the amount of time that you wait before being seen by our physicians.       _____________________________________________________________  Should you have questions after your visit to [facility], please contact our office at [phone] and follow the prompts.  Our office hours are 8:00 a.m. and 4:30 p.m. Monday - Friday.  Please note that voicemails left after 4:00 p.m. may not be returned until the following business day.  We are closed weekends and major holidays.  You do have access to a nurse 24-7, just call the main number to the clinic [phone] and do not press any options, hold on the line and a nurse will answer the phone.    For prescription refill requests, have your pharmacy contact our office and allow 72 hours.    Due to  Covid, you will need to wear a mask upon entering the hospital. If you do not have a mask, a mask will be given to you at the Main Entrance upon arrival. For doctor visits, patients may have 1 support person age 24 or older with them. For treatment visits, patients can not have anyone with them due to social distancing guidelines and our immunocompromised population.

## 2021-09-25 ENCOUNTER — Encounter (HOSPITAL_COMMUNITY): Payer: Self-pay | Admitting: Hematology

## 2021-09-26 ENCOUNTER — Encounter: Payer: Self-pay | Admitting: Family Medicine

## 2021-09-26 ENCOUNTER — Ambulatory Visit (INDEPENDENT_AMBULATORY_CARE_PROVIDER_SITE_OTHER): Payer: Medicare PPO | Admitting: Family Medicine

## 2021-09-26 VITALS — BP 132/90 | HR 94 | Temp 97.9°F | Ht 65.0 in | Wt 154.0 lb

## 2021-09-26 DIAGNOSIS — R1012 Left upper quadrant pain: Secondary | ICD-10-CM | POA: Diagnosis not present

## 2021-09-26 DIAGNOSIS — E039 Hypothyroidism, unspecified: Secondary | ICD-10-CM | POA: Diagnosis not present

## 2021-09-26 DIAGNOSIS — R197 Diarrhea, unspecified: Secondary | ICD-10-CM

## 2021-09-26 NOTE — Progress Notes (Signed)
Subjective:  Patient ID: [name], female    DOB: [birth year]  Age: 81 y.o. MRN: [id number]  CC: New Patient (Initial Visit)   HPI [name] presents for new patient visit for establishment of care.  Her main concern today is chronic upper abdominal pain.  This is been going on possibly since she had renal cancer 2-1/2 years ago.  She had her gallbladder removed in the fall due to cholelithiasis but that did not seem to help with the pain.  She did have a change in character of the discomfort after the surgery.  Currently she describes a discomfort in the left upper quadrant that is intermittent.  It is moderate in severity.  It is not related to activities or food.  There is 1 exception in that when she bends forward it seems to exacerbate the discomfort.  She has had diarrhea.  It varies from time to time.  This morning she says she had 7 bowel movements from the time she got up at 6 until her daughter arrived at her home at 10 each of these was watery.  She has not had nausea or vomiting.  Her appetite has been normal.   follow-up on  thyroid. The patient has a history of hypothyroidism for many years.  Recently the level was somewhat off and her dose was changed.. Pt. denies any change in  voice, loss of hair, heat or cold intolerance. Energy level has been adequate to good. Patient denies constipation and diarrhea. No myxedema. Medication is as noted below. Verified that pt is taking it daily on an empty stomach. Well tolerated.   Depression screen [facility] 2/9 09/26/2021  Decreased Interest 0  Down, Depressed, Hopeless 0  PHQ - 2 Score 0  Altered sleeping 0  Tired, decreased energy 3  Change in appetite 0  Feeling bad or failure about yourself  0  Trouble concentrating 1  Moving slowly or fidgety/restless 1  Suicidal thoughts 0  PHQ-9 Score 5  Difficult doing work/chores Very difficult    History [name] has a past medical history of Adenocarcinoma of left breast (04/16/2012),  Breast cancer ([place]), COPD (chronic obstructive pulmonary disease) ([place]), Gall bladder stones, Grave's disease, Headache, High triglycerides, History of kidney stones, Hyperthyroidism, Nerve compression, Pneumonia (1990), PONV (postoperative nausea and vomiting), and Sepsis due to Escherichia coli with acute renal failure without septic shock ([place]).   She has a past surgical history that includes Mastectomy; Carpal tunnel release; cataract surgery; Tonsillectomy; Eye surgery; Laparoscopic nephrectomy (Left, 03/25/2019); Colonoscopy; Node dissection (Left, 09/11/2019); Thoracotomy/lobectomy (Left, 09/11/2019); Nephrectomy (Left); and Cholecystectomy (N/A, 05/05/2021).   Her family history includes Cancer in her brother, father, and another family member.She reports that she quit smoking about 2 years ago. Her smoking use included cigarettes. She has a 60.00 pack-year smoking history. She has never used smokeless tobacco. She reports that she does not drink alcohol and does not use drugs.    ROS Review of Systems  Constitutional: Negative.   HENT:  Negative for congestion.   Eyes:  Negative for visual disturbance.  Respiratory:  Negative for shortness of breath.   Cardiovascular:  Negative for chest pain.  Gastrointestinal:  Positive for abdominal distention (Increased gas), abdominal pain and diarrhea. Negative for constipation, nausea and vomiting.  Genitourinary:  Negative for difficulty urinating.  Musculoskeletal:  Negative for arthralgias and myalgias.  Neurological:  Negative for headaches.  Psychiatric/Behavioral:  Negative for sleep disturbance.    Objective:  BP 132/90  Pulse 94    Temp 97.9 F (36.6 C)    Ht '5\' 5"'  (1.651 m)    Wt 154 lb (69.9 kg)    SpO2 92%    BMI 25.63 kg/m   BP Readings from Last 3 Encounters:  09/26/21 132/90  07/18/21 137/74  05/05/21 (!) 150/72    Wt Readings from Last 3 Encounters:  09/26/21 154 lb (69.9 kg)  07/18/21 152 lb 8 oz (69.2 kg)  05/05/21  153 lb 10.6 oz (69.7 kg)     Physical Exam Constitutional:      General: She is not in acute distress.    Appearance: She is well-developed.  HENT:     Head: Normocephalic and atraumatic.  Eyes:     Conjunctiva/sclera: Conjunctivae normal.     Pupils: Pupils are equal, round, and reactive to light.  Neck:     Thyroid: No thyromegaly.  Cardiovascular:     Rate and Rhythm: Normal rate and regular rhythm.     Heart sounds: Normal heart sounds. No murmur heard. Pulmonary:     Effort: Pulmonary effort is normal. No respiratory distress.     Breath sounds: Normal breath sounds. No wheezing or rales.  Abdominal:     General: Bowel sounds are normal. There is no distension.     Palpations: Abdomen is soft. There is no mass.     Tenderness: There is abdominal tenderness (Mildly so at left costal margin). There is no guarding or rebound.  Musculoskeletal:        General: Normal range of motion.     Cervical back: Normal range of motion and neck supple.  Lymphadenopathy:     Cervical: No cervical adenopathy.  Skin:    General: Skin is warm and dry.  Neurological:     Mental Status: She is alert and oriented to person, place, and time.  Psychiatric:        Mood and Affect: Mood normal.        Behavior: Behavior normal.        Thought Content: Thought content normal.        Judgment: Judgment normal.      Assessment & Plan:   [name] was seen today for new patient (initial visit).  Diagnoses and all orders for this visit:  Hypothyroidism, unspecified type -     TSH + free T4 -     T3, Free -     CMP14+EGFR  Left upper quadrant abdominal pain -     Cdiff NAA+O+P+Stool Culture -     Lipase  Diarrhea, unspecified type -     Cdiff NAA+O+P+Stool Culture       I have discontinued [name]'s simvastatin. I am also having her maintain her Centrum Silver Ultra Womens, hydroxypropyl methylcellulose / hypromellose, meclizine, ferrous sulfate, Calcium-Vitamin D (CVS  CALCIUM-600/VIT D PO), acetaminophen, and levothyroxine.  Allergies as of 09/26/2021       Reactions   Chantix [varenicline]    depression        Medication List        Accurate as of September 26, 2021  5:14 PM. If you have any questions, ask your nurse or doctor.          STOP taking these medications    simvastatin 80 MG tablet Commonly known as: ZOCOR Stopped by: Claretta Fraise, MD       TAKE these medications    acetaminophen 500 MG tablet Commonly known as: TYLENOL Take 500 mg by  mouth every 8 (eight) hours as needed for moderate pain.   Centrum Silver Ultra Womens Tabs Take 1 tablet by mouth daily.   CVS CALCIUM-600/VIT D PO Take 1 capsule by mouth in the morning and at bedtime.   ferrous sulfate 325 (65 FE) MG EC tablet Take 325 mg by mouth daily.   hydroxypropyl methylcellulose / hypromellose 2.5 % ophthalmic solution Commonly known as: ISOPTO TEARS / GONIOVISC Place 1 drop into both eyes 3 (three) times daily as needed for dry eyes.   levothyroxine 75 MCG tablet Commonly known as: SYNTHROID Take 75 mcg by mouth daily before breakfast. What changed: Another medication with the same name was removed. Continue taking this medication, and follow the directions you see here. Changed by: Claretta Fraise, MD   meclizine 25 MG tablet Commonly known as: ANTIVERT Take 25 mg by mouth daily as needed for dizziness or nausea.         Follow-up: Return in about 1 month (around 10/27/2021).  Claretta Fraise, M.D.

## 2021-09-27 LAB — LIPASE: Lipase: 70 U/L (ref 14–85)

## 2021-09-27 LAB — CMP14+EGFR
ALT: 15 IU/L (ref 0–32)
AST: 27 IU/L (ref 0–40)
Albumin/Globulin Ratio: 2 (ref 1.2–2.2)
Albumin: 5 g/dL — ABNORMAL HIGH (ref 3.7–4.7)
Alkaline Phosphatase: 117 IU/L (ref 44–121)
BUN/Creatinine Ratio: 13 (ref 12–28)
BUN: 12 mg/dL (ref 8–27)
Bilirubin Total: 0.5 mg/dL (ref 0.0–1.2)
CO2: 23 mmol/L (ref 20–29)
Calcium: 9.9 mg/dL (ref 8.7–10.3)
Chloride: 104 mmol/L (ref 96–106)
Creatinine, Ser: 0.94 mg/dL (ref 0.57–1.00)
Globulin, Total: 2.5 g/dL (ref 1.5–4.5)
Glucose: 90 mg/dL (ref 70–99)
Potassium: 4.9 mmol/L (ref 3.5–5.2)
Sodium: 143 mmol/L (ref 134–144)
Total Protein: 7.5 g/dL (ref 6.0–8.5)
eGFR: 62 mL/min/{1.73_m2} (ref >59)

## 2021-09-27 LAB — TSH+FREE T4
Free T4: 1.24 ng/dL (ref 0.82–1.77)
TSH: 1.31 u[IU]/mL (ref 0.450–4.500)

## 2021-09-27 LAB — T3, FREE: T3, Free: 2.5 pg/mL (ref 2.0–4.4)

## 2021-09-27 NOTE — Progress Notes (Signed)
Hello Judith,  Your lab result is normal and/or stable.Some minor variations that are not significant are commonly marked abnormal, but do not represent any medical problem for you.  Best regards, Claretta Fraise, M.D.

## 2021-09-29 ENCOUNTER — Ambulatory Visit (INDEPENDENT_AMBULATORY_CARE_PROVIDER_SITE_OTHER): Payer: Medicare PPO

## 2021-09-29 DIAGNOSIS — Z0000 Encounter for general adult medical examination without abnormal findings: Secondary | ICD-10-CM | POA: Diagnosis not present

## 2021-09-29 NOTE — Progress Notes (Signed)
Subjective:   [name] is a 81 y.o. female who presents for Medicare Annual (Initial) preventive examination.  Virtual Visit via Telephone Note  I connected with  [name] on 09/29/21 at  9:00 AM EST by telephone and verified that I am speaking with the correct person using two identifiers.  Location: Patient: Home Provider: [facility] Persons participating in the virtual visit: patient/ daughter, [facility] Advisor   I discussed the limitations, risks, security and privacy concerns of performing an evaluation and management service by telephone and the availability of in person appointments. The patient expressed understanding and agreed to proceed.  Interactive audio and video telecommunications were attempted between this nurse and patient, however failed, due to patient having technical difficulties OR patient did not have access to video capability.  We continued and completed visit with audio only.  Some vital signs may be absent or patient reported.   Shelby Enriques E Jaiven Graveline, LPN   Review of Systems     Cardiac Risk Factors include: advanced age (>21men, >3 women);sedentary lifestyle;Other (see comment), Risk factor comments: hx of lung cancer, kidney cancer, breast cancer, emphysema     Objective:    Today's Vitals   09/29/21 0900  PainSc: 8    There is no height or weight on file to calculate BMI.  Advanced Directives 09/29/2021 07/18/2021 05/05/2021 04/28/2021 04/11/2021 01/13/2021 12/05/2020  Does Patient Have a Medical Advance Directive? Yes Yes Yes Yes Yes No Yes  Type of Paramedic of [place];Living will [place];Living will [place];Living will [place];Living will [place];Living will - Living will;Healthcare Power of Attorney  Does patient want to make changes to medical advance directive? - No - Patient declined No - Patient declined - No - Patient declined -  No - Patient declined  Copy of [place] in Chart? No - copy requested - No - copy requested No - copy requested No - copy requested - No - copy requested  Would patient like information on creating a medical advance directive? - - - - - No - Patient declined No - Patient declined  Pre-existing out of facility DNR order (yellow form or pink MOST form) - - - - - - -    Current Medications (verified) Outpatient Encounter Medications as of 09/29/2021  Medication Sig   acetaminophen (TYLENOL) 500 MG tablet Take 500 mg by mouth every 8 (eight) hours as needed for moderate pain.   Calcium-Vitamin D (CVS CALCIUM-600/VIT D PO) Take 1 capsule by mouth in the morning and at bedtime.   ferrous sulfate 325 (65 FE) MG EC tablet Take 325 mg by mouth daily.   hydroxypropyl methylcellulose / hypromellose (ISOPTO TEARS / GONIOVISC) 2.5 % ophthalmic solution Place 1 drop into both eyes 3 (three) times daily as needed for dry eyes.    levothyroxine (SYNTHROID) 75 MCG tablet Take 75 mcg by mouth daily before breakfast.   meclizine (ANTIVERT) 25 MG tablet Take 25 mg by mouth daily as needed for dizziness or nausea.   Multiple Vitamins-Minerals (CENTRUM SILVER ULTRA WOMENS) TABS Take 1 tablet by mouth daily.   No facility-administered encounter medications on file as of 09/29/2021.    Allergies (verified) Chantix [varenicline]   History: Past Medical History:  Diagnosis Date   Adenocarcinoma of left breast 04/16/2012   Stage II (T2 N0 M0) left-sided adenocarcinoma breast diagnosed in 1990 by Dr. Romona Curls and treated by Dr. Juanita Craver  on NSAB PPD-19 with CMF followed by 5 years of tamoxifen and she remains disease free thus far.    Breast cancer ([place])    left breast/mastectomy/dx approx1990   COPD (chronic obstructive pulmonary disease) ([place])    Gall bladder stones    Grave's disease    Headache    Migraines   High triglycerides    History of kidney stones    small stone  rt     Hyperthyroidism    Nerve compression    pinched in neck   Pneumonia 1990   PONV (postoperative nausea and vomiting)    Sepsis due to Escherichia coli with acute renal failure without septic shock ([place])    Past Surgical History:  Procedure Laterality Date   CARPAL TUNNEL RELEASE     rt. hand   cataract surgery     bilateral   CHOLECYSTECTOMY N/A 05/05/2021   Procedure: LAPAROSCOPIC CHOLECYSTECTOMY;  Surgeon: Clovis Riley, MD;  Location: [facility];  Service: General;  Laterality: N/A;   COLONOSCOPY     EYE SURGERY     LAPAROSCOPIC NEPHRECTOMY Left 03/25/2019   Procedure: LAPAROSCOPIC RADICAL NEPHRECTOMY;  Surgeon: Ceasar Mons, MD;  Location: [facility];  Service: Urology;  Laterality: Left;   MASTECTOMY     left.   NEPHRECTOMY Left    NODE DISSECTION Left 09/11/2019   Procedure: Node Dissection;  Surgeon: [name], MD;  Location: [facility];  Service: Thoracic;  Laterality: Left;   THORACOTOMY/LOBECTOMY Left 09/11/2019   Procedure: Thoracotomy/Lobectomy Left Upper Lobe;  Surgeon: [name], MD;  Location: [facility];  Service: Thoracic;  Laterality: Left;   TONSILLECTOMY     Family History  Problem Relation Age of Onset   Cancer Father        lung cancer   Cancer Brother        colon cancer   Cancer Other        lung cancer/died age 2   Social History   Socioeconomic History   Marital status: Divorced    Spouse name: Not on file   Number of children: 2   Years of education: Not on file   Highest education level: Not on file  Occupational History   Occupation: retired  Tobacco Use   Smoking status: Former    Packs/day: 1.00    Years: 60.00    Pack years: 60.00    Types: Cigarettes    Quit date: 09/22/2019    Years since quitting: 2.0   Smokeless tobacco: Never   Tobacco comments:    attempted to stop but its expensive to quit  Vaping Use   Vaping Use: Never used  Substance and Sexual Activity   Alcohol use: No   Drug use: No   Sexual  activity: Not on file  Other Topics Concern   Not on file  Social History Narrative   Lives alone - has a basement but doesn't use it   Daughter [name] checks on her daily   Daughter [name] also helps a lot   Social Determinants of Health   Financial Resource Strain: Low Risk    Difficulty of Paying Living Expenses: Not very hard  Food Insecurity: No Food Insecurity   Worried About Charity fundraiser in the Last Year: Never true   [place] in the Last Year: Never true  Transportation Needs: No Transportation Needs   Lack of Transportation (Medical): No   Lack of Transportation (Non-Medical): No  Physical Activity: Insufficiently Active   Days of Exercise per Week: 7 days   Minutes of Exercise per Session: 10 min  Stress: No Stress Concern Present   Feeling of Stress : Not at all  Social Connections: Socially Isolated   Frequency of Communication with Friends and Family: More than three times a week   Frequency of Social Gatherings with Friends and Family: More than three times a week   Attends Religious Services: Never   Marine scientist or Organizations: No   Attends Archivist Meetings: Never   Marital Status: Widowed    Tobacco Counseling Counseling given: Not Answered Tobacco comments: attempted to stop but its expensive to quit   Clinical Intake:  Pre-visit preparation completed: Yes  Pain : 0-10 Pain Score: 8  Pain Type: Chronic pain Pain Location: Abdomen Pain Orientation: Upper Pain Descriptors / Indicators: Aching, Sore, Discomfort Pain Onset: More than a month ago Pain Frequency: Intermittent     BMI - recorded: 25.63 Nutritional Status: BMI 25 -29 Overweight Nutritional Risks: None Diabetes: No  How often do you need to have someone help you when you read instructions, pamphlets, or other written materials from your doctor or pharmacy?: 1 - Never  Diabetic? no  Interpreter Needed?: No  Information entered by  :: Alyissa Whidbee, LPN   Activities of Daily Living In your present state of health, do you have any difficulty performing the following activities: 09/29/2021 04/28/2021  Hearing? Y Y  Comment wears hearing aids [place]? N N  Difficulty concentrating or making decisions? Tempie Donning  Walking or climbing stairs? Y Y  Dressing or bathing? N N  Doing errands, shopping? Y N  Preparing Food and eating ? N -  Using the Toilet? N -  In the past six months, have you accidently leaked urine? Y -  Comment wears depends - usually makes it to bathroom, but has accidents -  Do you have problems with loss of bowel control? Y -  Comment wears depends - usually makes it to bathroom, but has accidents -  Managing your Medications? Y -  Comment daughter puts in 7 day pill box - she remembers to take daily -  Managing your Finances? Y -  Housekeeping or managing your Housekeeping? Y -  Some recent data might be hidden    Patient Care Team: Claretta Fraise, MD as PCP - General (Family Medicine) Tanda Rockers, MD as Consulting Physician (Pulmonary Disease) Derek Jack, MD as Consulting Physician (Hematology) [name], MD as Consulting Physician (Cardiothoracic Surgery) [facility] [place], MD as Attending Physician  Indicate any recent Medical Services you may have received from other than [facility] providers in the past year (date may be approximate).     Assessment:   This is a routine wellness examination for [place].  Hearing/Vision screen Hearing Screening - Comments:: Wears hearing aids - from [facility] Vision Screening - Comments:: Wears rx glasses - behind on annual eye exams with [facility]  Dietary issues and exercise activities discussed: Current Exercise Habits: The patient does not participate in regular exercise at present, Exercise limited by: respiratory conditions(s)   Goals Addressed             This Visit's Progress     Patient Stated       Stay as active as possible, continue to complete ADLs independently        Depression Screen PHQ 2/9 Scores 09/29/2021 09/26/2021  PHQ -  2 Score 0 0  PHQ- 9 Score 4 5    Fall Risk Fall Risk  09/29/2021 09/26/2021  Falls in the past year? 1 1  Number falls in past yr: 0 0  Injury with Fall? 0 0  Risk for fall due to : History of fall(s);Impaired balance/gait History of fall(s)  Follow up Falls prevention discussed Falls evaluation completed    FALL RISK PREVENTION PERTAINING TO THE HOME:  Any stairs in or around the home? Yes  If so, are there any without handrails? No  Home free of loose throw rugs in walkways, pet beds, electrical cords, etc? Yes  Adequate lighting in your home to reduce risk of falls? Yes   ASSISTIVE DEVICES UTILIZED TO PREVENT FALLS:  Life alert? No  Use of a cane, walker or w/c? Yes  Grab bars in the bathroom? Yes  Shower chair or bench in shower? Yes  Elevated toilet seat or a handicapped toilet? Yes   TIMED UP AND GO:  Was the test performed? No . Telephonic visit  Cognitive Function:     6CIT Screen 09/29/2021  What Year? 0 points  What month? 0 points  What time? 0 points  Count back from 20 0 points  Months in reverse 0 points  Repeat phrase 4 points  Total Score 4    Immunizations Immunization History  Administered Date(s) Administered   Fluad Quad(high Dose 65+) 05/15/2019   Influenza, High Dose Seasonal PF 05/30/2020   Influenza-Unspecified 06/03/2018, 07/04/2021   Janssen (J&J) SARS-COV-2 Vaccination 02/15/2020   Moderna SARS-COV2 Booster Vaccination 10/15/2020, 08/21/2021    TDAP status: Up to date  Flu Vaccine status: Up to date  Pneumococcal vaccine status: Up to date  Covid-19 vaccine status: Completed vaccines  Qualifies for Shingles Vaccine? Yes   Zostavax completed Yes   Shingrix Completed?: Yes  Screening Tests Health Maintenance  Topic Date Due   COVID-19 Vaccine (2 - Janssen risk  series) 10/12/2021 (Originally 09/18/2021)   Zoster Vaccines- Shingrix (1 of 2) 12/25/2021 (Originally 09/28/1959)   Pneumonia Vaccine 40+ Years old (1 - PCV) 09/26/2022 (Originally 09/28/1946)   TETANUS/TDAP  09/26/2022 (Originally 09/28/1959)   Hepatitis C Screening  09/26/2022 (Originally 09/28/1958)   INFLUENZA VACCINE  Completed   DEXA SCAN  Completed   HPV VACCINES  Aged Out    Health Maintenance  There are no preventive care reminders to display for this patient.  Colorectal cancer screening: No longer required.   Mammogram status: Completed 04/26/2021. Repeat every year  Bone Density status: Completed 06/02/2015. Results reflect: Bone density results: OSTEOPENIA. Repeat every 2 years. She declines  Lung Cancer Screening: (Low Dose CT Chest recommended if Age 34-80 years, 30 pack-year currently smoking OR have quit w/in 15years.) does not qualify.   Additional Screening:  Hepatitis C Screening: does not qualify  Vision Screening: Recommended annual ophthalmology exams for early detection of glaucoma and other disorders of the eye. Is the patient up to date with their annual eye exam?  No  Who is the provider or what is the name of the office in which the patient attends annual eye exams? [facility] If pt is not established with a provider, would they like to be referred to a provider to establish care? No .   Dental Screening: Recommended annual dental exams for proper oral hygiene  Community Resource Referral / Chronic Care Management: CRR required this visit?  No   CCM required this visit?  No      Plan:  I have personally reviewed and noted the following in the patients chart:   Medical and social history Use of alcohol, tobacco or illicit drugs  Current medications and supplements including opioid prescriptions.  Functional ability and status Nutritional status Physical activity Advanced directives List of other physicians Hospitalizations, surgeries,  and ER visits in previous 12 months Vitals Screenings to include cognitive, depression, and falls Referrals and appointments  In addition, I have reviewed and discussed with patient certain preventive protocols, quality metrics, and best practice recommendations. A written personalized care plan for preventive services as well as general preventive health recommendations were provided to patient.   Due to this being a telephonic visit, the after visit summary with patients personalized plan was offered to patient via mail or my-chart. Patient would like to access on my-chart  Sandrea Hammond, LPN   12/10/1446   Nurse Notes: daughter thinks she is utd on all vaccines, but we don't have all of her records

## 2021-09-29 NOTE — Patient Instructions (Signed)
[name] , Thank you for taking time to come for your Medicare Wellness Visit. I appreciate your ongoing commitment to your health goals. Please review the following plan we discussed and let me know if I can assist you in the future.   Screening recommendations/referrals: Colonoscopy: no longer required Mammogram: Done 04/26/2021 - Repeat annually  Bone Density: Done 06/02/2015 - Repeat every 2 years *due Recommended yearly ophthalmology/optometry visit for glaucoma screening and checkup Recommended yearly dental visit for hygiene and checkup  Vaccinations: Influenza vaccine: Done 07/2021 - Repeat annually Pneumococcal vaccine: Done - need dates Tdap vaccine: Done - need dates Shingles vaccine: Done - need dates   Covid-19: Done 02/15/2020, 10/15/2020, 08/21/2021  Advanced directives: Please bring a copy of your health care power of attorney and living will to the office to be added to your chart at your convenience.   Conditions/risks identified: Aim for 30 minutes of exercise each day, drink 6-8 glasses of water and eat lots of fruits and vegetables.  Next appointment: Follow up in one year for your annual wellness visit    Preventive Care 65 Years and Older, Female Preventive care refers to lifestyle choices and visits with your health care provider that can promote health and wellness. What does preventive care include? A yearly physical exam. This is also called an annual well check. Dental exams once or twice a year. Routine eye exams. Ask your health care provider how often you should have your eyes checked. Personal lifestyle choices, including: Daily care of your teeth and gums. Regular physical activity. Eating a healthy diet. Avoiding tobacco and drug use. Limiting alcohol use. Practicing safe sex. Taking low-dose aspirin every day. Taking vitamin and mineral supplements as recommended by your health care provider. What happens during an annual well check? The services  and screenings done by your health care provider during your annual well check will depend on your age, overall health, lifestyle risk factors, and family history of disease. Counseling  Your health care provider may ask you questions about your: Alcohol use. Tobacco use. Drug use. Emotional well-being. Home and relationship well-being. Sexual activity. Eating habits. History of falls. Memory and ability to understand (cognition). Work and work Statistician. Reproductive health. Screening  You may have the following tests or measurements: Height, weight, and BMI. Blood pressure. Lipid and cholesterol levels. These may be checked every 5 years, or more frequently if you are over 35 years old. Skin check. Lung cancer screening. You may have this screening every year starting at age 44 if you have a 30-pack-year history of smoking and currently smoke or have quit within the past 15 years. Fecal occult blood test (FOBT) of the stool. You may have this test every year starting at age 51. Flexible sigmoidoscopy or colonoscopy. You may have a sigmoidoscopy every 5 years or a colonoscopy every 10 years starting at age 83. Hepatitis C blood test. Hepatitis B blood test. Sexually transmitted disease (STD) testing. Diabetes screening. This is done by checking your blood sugar (glucose) after you have not eaten for a while (fasting). You may have this done every 1-3 years. Bone density scan. This is done to screen for osteoporosis. You may have this done starting at age 38. Mammogram. This may be done every 1-2 years. Talk to your health care provider about how often you should have regular mammograms. Talk with your health care provider about your test results, treatment options, and if necessary, the need for more tests. Vaccines  Your health  care provider may recommend certain vaccines, such as: Influenza vaccine. This is recommended every year. Tetanus, diphtheria, and acellular pertussis  (Tdap, Td) vaccine. You may need a Td booster every 10 years. Zoster vaccine. You may need this after age 67. Pneumococcal 13-valent conjugate (PCV13) vaccine. One dose is recommended after age 82. Pneumococcal polysaccharide (PPSV23) vaccine. One dose is recommended after age 88. Talk to your health care provider about which screenings and vaccines you need and how often you need them. This information is not intended to replace advice given to you by your health care provider. Make sure you discuss any questions you have with your health care provider. Document Released: 09/16/2015 Document Revised: 05/09/2016 Document Reviewed: 06/21/2015 Elsevier Interactive Patient Education  2017 [place] Prevention in the Home Falls can cause injuries. They can happen to people of all ages. There are many things you can do to make your home safe and to help prevent falls. What can I do on the outside of my home? Regularly fix the edges of walkways and driveways and fix any cracks. Remove anything that might make you trip as you walk through a door, such as a raised step or threshold. Trim any bushes or trees on the path to your home. Use bright outdoor lighting. Clear any walking paths of anything that might make someone trip, such as rocks or tools. Regularly check to see if handrails are loose or broken. Make sure that both sides of any steps have handrails. Any raised decks and porches should have guardrails on the edges. Have any leaves, snow, or ice cleared regularly. Use sand or salt on walking paths during winter. Clean up any spills in your garage right away. This includes oil or grease spills. What can I do in the bathroom? Use night lights. Install grab bars by the toilet and in the tub and shower. Do not use towel bars as grab bars. Use non-skid mats or decals in the tub or shower. If you need to sit down in the shower, use a plastic, non-slip stool. Keep the floor dry. Clean  up any water that spills on the floor as soon as it happens. Remove soap buildup in the tub or shower regularly. Attach bath mats securely with double-sided non-slip rug tape. Do not have throw rugs and other things on the floor that can make you trip. What can I do in the bedroom? Use night lights. Make sure that you have a light by your bed that is easy to reach. Do not use any sheets or blankets that are too big for your bed. They should not hang down onto the floor. Have a firm chair that has side arms. You can use this for support while you get dressed. Do not have throw rugs and other things on the floor that can make you trip. What can I do in the kitchen? Clean up any spills right away. Avoid walking on wet floors. Keep items that you use a lot in easy-to-reach places. If you need to reach something above you, use a strong step stool that has a grab bar. Keep electrical cords out of the way. Do not use floor polish or wax that makes floors slippery. If you must use wax, use non-skid floor wax. Do not have throw rugs and other things on the floor that can make you trip. What can I do with my stairs? Do not leave any items on the stairs. Make sure that there are handrails on  both sides of the stairs and use them. Fix handrails that are broken or loose. Make sure that handrails are as long as the stairways. Check any carpeting to make sure that it is firmly attached to the stairs. Fix any carpet that is loose or worn. Avoid having throw rugs at the top or bottom of the stairs. If you do have throw rugs, attach them to the floor with carpet tape. Make sure that you have a light switch at the top of the stairs and the bottom of the stairs. If you do not have them, ask someone to add them for you. What else can I do to help prevent falls? Wear shoes that: Do not have high heels. Have rubber bottoms. Are comfortable and fit you well. Are closed at the toe. Do not wear sandals. If you  use a stepladder: Make sure that it is fully opened. Do not climb a closed stepladder. Make sure that both sides of the stepladder are locked into place. Ask someone to hold it for you, if possible. Clearly mark and make sure that you can see: Any grab bars or handrails. First and last steps. Where the edge of each step is. Use tools that help you move around (mobility aids) if they are needed. These include: Canes. Walkers. Scooters. Crutches. Turn on the lights when you go into a dark area. Replace any light bulbs as soon as they burn out. Set up your furniture so you have a clear path. Avoid moving your furniture around. If any of your floors are uneven, fix them. If there are any pets around you, be aware of where they are. Review your medicines with your doctor. Some medicines can make you feel dizzy. This can increase your chance of falling. Ask your doctor what other things that you can do to help prevent falls. This information is not intended to replace advice given to you by your health care provider. Make sure you discuss any questions you have with your health care provider. Document Released: 06/16/2009 Document Revised: 01/26/2016 Document Reviewed: 09/24/2014 Elsevier Interactive Patient Education  2017 [name].

## 2021-09-30 LAB — CDIFF NAA+O+P+STOOL CULTURE
E coli, Shiga toxin Assay: NEGATIVE
Toxigenic C. Difficile by PCR: NEGATIVE

## 2021-10-23 ENCOUNTER — Inpatient Hospital Stay (HOSPITAL_COMMUNITY): Payer: Medicare PPO | Attending: Hematology

## 2021-10-23 ENCOUNTER — Ambulatory Visit (HOSPITAL_COMMUNITY)
Admission: RE | Admit: 2021-10-23 | Discharge: 2021-10-23 | Disposition: A | Discharge status: Home / Self Care | Payer: Medicare PPO | Source: Ambulatory Visit | Attending: Hematology | Admitting: Hematology

## 2021-10-23 ENCOUNTER — Other Ambulatory Visit: Payer: Self-pay

## 2021-10-23 DIAGNOSIS — Z87442 Personal history of urinary calculi: Secondary | ICD-10-CM | POA: Insufficient documentation

## 2021-10-23 DIAGNOSIS — C3412 Malignant neoplasm of upper lobe, left bronchus or lung: Secondary | ICD-10-CM | POA: Insufficient documentation

## 2021-10-23 DIAGNOSIS — R109 Unspecified abdominal pain: Secondary | ICD-10-CM | POA: Insufficient documentation

## 2021-10-23 DIAGNOSIS — C3492 Malignant neoplasm of unspecified part of left bronchus or lung: Secondary | ICD-10-CM

## 2021-10-23 DIAGNOSIS — I700 Atherosclerosis of aorta: Secondary | ICD-10-CM | POA: Diagnosis not present

## 2021-10-23 DIAGNOSIS — Z9012 Acquired absence of left breast and nipple: Secondary | ICD-10-CM | POA: Insufficient documentation

## 2021-10-23 DIAGNOSIS — D509 Iron deficiency anemia, unspecified: Secondary | ICD-10-CM | POA: Insufficient documentation

## 2021-10-23 DIAGNOSIS — Z801 Family history of malignant neoplasm of trachea, bronchus and lung: Secondary | ICD-10-CM | POA: Insufficient documentation

## 2021-10-23 DIAGNOSIS — J439 Emphysema, unspecified: Secondary | ICD-10-CM | POA: Diagnosis not present

## 2021-10-23 DIAGNOSIS — Z79899 Other long term (current) drug therapy: Secondary | ICD-10-CM | POA: Insufficient documentation

## 2021-10-23 DIAGNOSIS — R42 Dizziness and giddiness: Secondary | ICD-10-CM | POA: Insufficient documentation

## 2021-10-23 DIAGNOSIS — Z800 Family history of malignant neoplasm of digestive organs: Secondary | ICD-10-CM | POA: Insufficient documentation

## 2021-10-23 DIAGNOSIS — R5383 Other fatigue: Secondary | ICD-10-CM | POA: Insufficient documentation

## 2021-10-23 DIAGNOSIS — Z87891 Personal history of nicotine dependence: Secondary | ICD-10-CM | POA: Insufficient documentation

## 2021-10-23 DIAGNOSIS — R197 Diarrhea, unspecified: Secondary | ICD-10-CM | POA: Insufficient documentation

## 2021-10-23 DIAGNOSIS — K7460 Unspecified cirrhosis of liver: Secondary | ICD-10-CM | POA: Insufficient documentation

## 2021-10-23 DIAGNOSIS — Z9049 Acquired absence of other specified parts of digestive tract: Secondary | ICD-10-CM | POA: Insufficient documentation

## 2021-10-23 DIAGNOSIS — Z905 Acquired absence of kidney: Secondary | ICD-10-CM | POA: Insufficient documentation

## 2021-10-23 DIAGNOSIS — Z853 Personal history of malignant neoplasm of breast: Secondary | ICD-10-CM | POA: Insufficient documentation

## 2021-10-23 DIAGNOSIS — C3490 Malignant neoplasm of unspecified part of unspecified bronchus or lung: Secondary | ICD-10-CM | POA: Diagnosis not present

## 2021-10-23 DIAGNOSIS — C642 Malignant neoplasm of left kidney, except renal pelvis: Secondary | ICD-10-CM | POA: Insufficient documentation

## 2021-10-23 LAB — CBC WITH DIFFERENTIAL/PLATELET
Abs Immature Granulocytes: 0.01 10*3/uL (ref 0.00–0.07)
Basophils Absolute: 0.1 10*3/uL (ref 0.0–0.1)
Basophils Relative: 1 %
Eosinophils Absolute: 0.2 10*3/uL (ref 0.0–0.5)
Eosinophils Relative: 3 %
HCT: 47.3 % — ABNORMAL HIGH (ref 36.0–46.0)
Hemoglobin: 14.7 g/dL (ref 12.0–15.0)
Immature Granulocytes: 0 %
Lymphocytes Relative: 21 %
Lymphs Abs: 1.4 10*3/uL (ref 0.7–4.0)
MCH: 30 pg (ref 26.0–34.0)
MCHC: 31.1 g/dL (ref 30.0–36.0)
MCV: 96.5 fL (ref 80.0–100.0)
Monocytes Absolute: 0.4 10*3/uL (ref 0.1–1.0)
Monocytes Relative: 7 %
Neutro Abs: 4.4 10*3/uL (ref 1.7–7.7)
Neutrophils Relative %: 68 %
Platelets: 287 10*3/uL (ref 150–400)
RBC: 4.9 MIL/uL (ref 3.87–5.11)
RDW: 13.2 % (ref 11.5–15.5)
WBC: 6.4 10*3/uL (ref 4.0–10.5)
nRBC: 0 % (ref 0.0–0.2)

## 2021-10-23 LAB — FERRITIN: Ferritin: 40 ng/mL (ref 11–307)

## 2021-10-23 LAB — COMPREHENSIVE METABOLIC PANEL
ALT: 19 U/L (ref 0–44)
AST: 26 U/L (ref 15–41)
Albumin: 4.7 g/dL (ref 3.5–5.0)
Alkaline Phosphatase: 90 U/L (ref 38–126)
Anion gap: 10 (ref 5–15)
BUN: 18 mg/dL (ref 8–23)
CO2: 25 mmol/L (ref 22–32)
Calcium: 10.1 mg/dL (ref 8.9–10.3)
Chloride: 105 mmol/L (ref 98–111)
Creatinine, Ser: 0.94 mg/dL (ref 0.44–1.00)
GFR, Estimated: >60 mL/min (ref >60)
Glucose, Bld: 101 mg/dL — ABNORMAL HIGH (ref 70–99)
Potassium: 4.4 mmol/L (ref 3.5–5.1)
Sodium: 140 mmol/L (ref 135–145)
Total Bilirubin: 0.7 mg/dL (ref 0.3–1.2)
Total Protein: 7.9 g/dL (ref 6.5–8.1)

## 2021-10-23 LAB — IRON AND TIBC
Iron: 84 ug/dL (ref 28–170)
Saturation Ratios: 22 % (ref 10.4–31.8)
TIBC: 378 ug/dL (ref 250–450)
UIBC: 294 ug/dL

## 2021-10-25 ENCOUNTER — Encounter: Payer: Self-pay | Admitting: Family Medicine

## 2021-10-25 ENCOUNTER — Ambulatory Visit (INDEPENDENT_AMBULATORY_CARE_PROVIDER_SITE_OTHER): Payer: Medicare PPO | Admitting: Family Medicine

## 2021-10-25 VITALS — BP 116/66 | HR 88 | Temp 97.2°F | Ht 65.0 in | Wt 151.8 lb

## 2021-10-25 DIAGNOSIS — I700 Atherosclerosis of aorta: Secondary | ICD-10-CM

## 2021-10-25 DIAGNOSIS — R197 Diarrhea, unspecified: Secondary | ICD-10-CM

## 2021-10-25 DIAGNOSIS — K7460 Unspecified cirrhosis of liver: Secondary | ICD-10-CM

## 2021-10-25 MED ORDER — ATORVASTATIN CALCIUM 20 MG PO TABS: 20.0000 mg | ORAL_TABLET | Freq: Every day | ORAL | 1 refills | Status: DC

## 2021-10-25 MED ORDER — COLESTIPOL HCL 5 G PO GRAN: 5.0000 g | GRANULES | Freq: Two times a day (BID) | ORAL | 3 refills | Status: DC

## 2021-10-25 NOTE — Progress Notes (Signed)
Subjective:  Patient ID: [name], female    DOB: Oct 14, 1940  Age: 81 y.o. MRN: [id number]  CC: Follow-up   HPI [name] presents for continued abdominal pain.  Currently she describes it as being in the right upper quadrant.  It is moderate intermittent.  She just had a CT by her cancer specialist.  It was done of the chest but it did note that the right upper quadrant did have some cirrhosis.  She also has recently had cholecystectomy in the fall 2022 and remains tender in that area.  She does not have any nausea or vomiting.  However she does have diarrhea with 4-6 loose bowel movements every morning.  This is been ongoing since her last visit here see my notes from that day.  Depression screen [facility] 2/9 10/25/2021 09/29/2021 09/26/2021  Decreased Interest 0 0 0  Down, Depressed, Hopeless 0 0 0  PHQ - 2 Score 0 0 0  Altered sleeping - 0 0  Tired, decreased energy - 2 3  Change in appetite - 0 0  Feeling bad or failure about yourself  - 0 0  Trouble concentrating - 1 1  Moving slowly or fidgety/restless - 1 1  Suicidal thoughts - 0 0  PHQ-9 Score - 4 5  Difficult doing work/chores - Very difficult Very difficult    History [name] has a past medical history of Adenocarcinoma of left breast (04/16/2012), Breast cancer ([place]), COPD (chronic obstructive pulmonary disease) ([place]), Gall bladder stones, Grave's disease, Headache, High triglycerides, History of kidney stones, Hyperthyroidism, Nerve compression, Pneumonia (1990), PONV (postoperative nausea and vomiting), and Sepsis due to Escherichia coli with acute renal failure without septic shock ([place]).   She has a past surgical history that includes Mastectomy; Carpal tunnel release; cataract surgery; Tonsillectomy; Eye surgery; Laparoscopic nephrectomy (Left, 03/25/2019); Colonoscopy; Node dissection (Left, 09/11/2019); Thoracotomy/lobectomy (Left, 09/11/2019); Nephrectomy (Left); and Cholecystectomy (N/A, 05/05/2021).   Her family  history includes Cancer in her brother, father, and another family member.She reports that she quit smoking about 2 years ago. Her smoking use included cigarettes. She has a 60.00 pack-year smoking history. She has never used smokeless tobacco. She reports that she does not drink alcohol and does not use drugs.    ROS Review of Systems  Constitutional: Negative.   HENT:  Negative for congestion.   Eyes:  Negative for visual disturbance.  Respiratory:  Negative for shortness of breath.   Cardiovascular:  Negative for chest pain.  Gastrointestinal:  Positive for abdominal pain and diarrhea. Negative for constipation, nausea, rectal pain and vomiting.  Genitourinary:  Negative for difficulty urinating.  Musculoskeletal:  Negative for arthralgias and myalgias.  Neurological:  Negative for headaches.  Psychiatric/Behavioral:  Negative for sleep disturbance.    Objective:  BP 116/66    Pulse 88    Temp (!) 97.2 F (36.2 C)    Ht 5\' 5"  (1.651 m)    Wt 151 lb 12.8 oz (68.9 kg)    SpO2 94%    BMI 25.26 kg/m   BP Readings from Last 3 Encounters:  10/25/21 116/66  09/26/21 132/90  07/18/21 137/74    Wt Readings from Last 3 Encounters:  10/25/21 151 lb 12.8 oz (68.9 kg)  09/26/21 154 lb (69.9 kg)  07/18/21 152 lb 8 oz (69.2 kg)     Physical Exam Constitutional:      Appearance: She is well-developed.  HENT:     Head: Normocephalic and atraumatic.  Cardiovascular:  Rate and Rhythm: Normal rate and regular rhythm.     Tyler sounds: No murmur heard. Pulmonary:     Effort: Pulmonary effort is normal.     Breath sounds: Normal breath sounds.  Abdominal:     General: Bowel sounds are normal.     Palpations: Abdomen is soft. There is no mass.     Tenderness: There is abdominal tenderness (RUQ). There is no guarding or rebound.  Skin:    General: Skin is warm and dry.  Neurological:     Mental Status: She is alert and oriented to person, place, and time.  Psychiatric:         Behavior: Behavior normal.      Assessment & Plan:   [name] was seen today for follow-up.  Diagnoses and all orders for this visit:  Aortic atherosclerosis ([place])  Cirrhosis of liver without ascites, unspecified hepatic cirrhosis type ([place]) -     Ambulatory referral to Gastroenterology  Diarrhea, unspecified type -     Ambulatory referral to Gastroenterology  Other orders -     atorvastatin (LIPITOR) 20 MG tablet; Take 1 tablet (20 mg total) by mouth daily. For cholesterol -     colestipol (COLESTID) 5 g granules; Take 5 g by mouth 2 (two) times daily.       I am having [name] start on atorvastatin and colestipol. I am also having her maintain her Centrum Silver Ultra Womens, hydroxypropyl methylcellulose / hypromellose, meclizine, ferrous sulfate, Calcium-Vitamin D ([facility] CALCIUM-600/VIT D PO), acetaminophen, and levothyroxine.  Allergies as of 10/25/2021       Reactions   Chantix [varenicline]    depression        Medication List        Accurate as of October 25, 2021 10:08 PM. If you have any questions, ask your nurse or doctor.          acetaminophen 500 MG tablet Commonly known as: TYLENOL Take 500 mg by mouth every 8 (eight) hours as needed for moderate pain.   atorvastatin 20 MG tablet Commonly known as: LIPITOR Take 1 tablet (20 mg total) by mouth daily. For cholesterol Started by: Claretta Fraise, MD   Centrum Silver Ultra Womens Tabs Take 1 tablet by mouth daily.   colestipol 5 g granules Commonly known as: COLESTID Take 5 g by mouth 2 (two) times daily. Started by: Claretta Fraise, MD   [facility] CALCIUM-600/VIT D PO Take 1 capsule by mouth in the morning and at bedtime.   ferrous sulfate 325 (65 FE) MG EC tablet Take 325 mg by mouth daily.   hydroxypropyl methylcellulose / hypromellose 2.5 % ophthalmic solution Commonly known as: ISOPTO TEARS / GONIOVISC Place 1 drop into both eyes 3 (three) times daily as needed for dry eyes.    levothyroxine 75 MCG tablet Commonly known as: SYNTHROID Take 75 mcg by mouth daily before breakfast.   meclizine 25 MG tablet Commonly known as: ANTIVERT Take 25 mg by mouth daily as needed for dizziness or nausea.         Follow-up: Return in about 3 months (around 01/22/2022), or if symptoms worsen or fail to improve.  Claretta Fraise, M.D.

## 2021-10-26 ENCOUNTER — Encounter (INDEPENDENT_AMBULATORY_CARE_PROVIDER_SITE_OTHER): Payer: Self-pay | Admitting: *Deleted

## 2021-10-27 ENCOUNTER — Telehealth: Payer: Self-pay | Admitting: Family Medicine

## 2021-10-30 ENCOUNTER — Other Ambulatory Visit: Payer: Self-pay

## 2021-10-30 ENCOUNTER — Inpatient Hospital Stay (HOSPITAL_COMMUNITY): Payer: Medicare PPO | Admitting: Hematology

## 2021-10-30 VITALS — BP 124/67 | HR 89 | Temp 97.1°F | Resp 17 | Ht 65.0 in | Wt 155.6 lb

## 2021-10-30 DIAGNOSIS — Z85528 Personal history of other malignant neoplasm of kidney: Secondary | ICD-10-CM

## 2021-10-30 DIAGNOSIS — D509 Iron deficiency anemia, unspecified: Secondary | ICD-10-CM | POA: Diagnosis not present

## 2021-10-30 DIAGNOSIS — Z853 Personal history of malignant neoplasm of breast: Secondary | ICD-10-CM | POA: Diagnosis not present

## 2021-10-30 DIAGNOSIS — Z87891 Personal history of nicotine dependence: Secondary | ICD-10-CM | POA: Diagnosis not present

## 2021-10-30 DIAGNOSIS — C3412 Malignant neoplasm of upper lobe, left bronchus or lung: Secondary | ICD-10-CM | POA: Diagnosis not present

## 2021-10-30 DIAGNOSIS — Z87442 Personal history of urinary calculi: Secondary | ICD-10-CM | POA: Diagnosis not present

## 2021-10-30 DIAGNOSIS — Z79899 Other long term (current) drug therapy: Secondary | ICD-10-CM | POA: Diagnosis not present

## 2021-10-30 DIAGNOSIS — I700 Atherosclerosis of aorta: Secondary | ICD-10-CM | POA: Diagnosis not present

## 2021-10-30 DIAGNOSIS — C642 Malignant neoplasm of left kidney, except renal pelvis: Secondary | ICD-10-CM | POA: Diagnosis not present

## 2021-10-30 DIAGNOSIS — C3492 Malignant neoplasm of unspecified part of left bronchus or lung: Secondary | ICD-10-CM | POA: Diagnosis not present

## 2021-10-30 DIAGNOSIS — R109 Unspecified abdominal pain: Secondary | ICD-10-CM | POA: Diagnosis not present

## 2021-10-30 DIAGNOSIS — Z800 Family history of malignant neoplasm of digestive organs: Secondary | ICD-10-CM | POA: Diagnosis not present

## 2021-10-30 DIAGNOSIS — K7460 Unspecified cirrhosis of liver: Secondary | ICD-10-CM | POA: Diagnosis not present

## 2021-10-30 DIAGNOSIS — R197 Diarrhea, unspecified: Secondary | ICD-10-CM | POA: Diagnosis not present

## 2021-10-30 DIAGNOSIS — Z801 Family history of malignant neoplasm of trachea, bronchus and lung: Secondary | ICD-10-CM | POA: Diagnosis not present

## 2021-10-30 DIAGNOSIS — R42 Dizziness and giddiness: Secondary | ICD-10-CM | POA: Diagnosis not present

## 2021-10-30 DIAGNOSIS — Z9012 Acquired absence of left breast and nipple: Secondary | ICD-10-CM | POA: Diagnosis not present

## 2021-10-30 DIAGNOSIS — Z905 Acquired absence of kidney: Secondary | ICD-10-CM | POA: Diagnosis not present

## 2021-10-30 DIAGNOSIS — Z9049 Acquired absence of other specified parts of digestive tract: Secondary | ICD-10-CM | POA: Diagnosis not present

## 2021-10-30 DIAGNOSIS — R5383 Other fatigue: Secondary | ICD-10-CM | POA: Diagnosis not present

## 2021-10-30 NOTE — Telephone Encounter (Signed)
Just use immodium as needed

## 2021-10-30 NOTE — Patient Instructions (Signed)
[place] at [facility] Discharge Instructions   You were seen and examined today by Dr. Delton Coombes.  He reviewed the results of your lab work and CT scan which are normal/stable.  Return as scheduled in 6 months with repeat CT scan and labs prior.   Thank you for choosing [place] at [facility] to provide your oncology and hematology care.  To afford each patient quality time with our provider, please arrive at least 15 minutes before your scheduled appointment time.   If you have a lab appointment with the [facility] please come in thru the Main Entrance and check in at the main information desk.  You need to re-schedule your appointment should you arrive 10 or more minutes late.  We strive to give you quality time with our providers, and arriving late affects you and other patients whose appointments are after yours.  Also, if you no show three or more times for appointments you may be dismissed from the clinic at the providers discretion.     Again, thank you for choosing [facility].  Our hope is that these requests will decrease the amount of time that you wait before being seen by our physicians.       _____________________________________________________________  Should you have questions after your visit to [facility], please contact our office at [phone] and follow the prompts.  Our office hours are 8:00 a.m. and 4:30 p.m. Monday - Friday.  Please note that voicemails left after 4:00 p.m. may not be returned until the following business day.  We are closed weekends and major holidays.  You do have access to a nurse 24-7, just call the main number to the clinic [phone] and do not press any options, hold on the line and a nurse will answer the phone.    For prescription refill requests, have your pharmacy contact our office and allow 72 hours.    Due to Covid, you will need to wear a mask upon  entering the hospital. If you do not have a mask, a mask will be given to you at the Main Entrance upon arrival. For doctor visits, patients may have 1 support person age 54 or older with them. For treatment visits, patients can not have anyone with them due to social distancing guidelines and our immunocompromised population.

## 2021-10-30 NOTE — Progress Notes (Signed)
[address] [place], [place] [address]   CLINIC:  Medical Oncology/Hematology  PCP:  Shelby Fraise, MD [place] / [place] [address] [phone]   REASON FOR VISIT:  Follow-up for squamous cell carcinoma of left lung and IDA  PRIOR THERAPY:  1. Left upper lobe lobectomy on 09/11/2019. 2. Intermittent Feraheme last on 04/01/2020.  NGS Results: not done  CURRENT THERAPY: Iron tablets daily  BRIEF ONCOLOGIC HISTORY:  Oncology History  Clear cell renal cell carcinoma, left ([facility])  04/13/2019 Initial Diagnosis   Clear cell renal cell carcinoma, left ([place])   04/13/2019 Tyler Staging   Staging form: Kidney, AJCC 8th Edition - Clinical: Stage III (cT3a, cNX, cM0) - Signed by Shelby Jack, MD on 04/13/2019    Lung Tyler ([place])  09/11/2019 Initial Diagnosis   Lung Tyler ([place])   10/26/2019 Tyler Staging   Staging form: Lung, AJCC 8th Edition - Clinical stage from 10/26/2019: Stage IIB (cT1b, cN1, cM0) - Signed by Shelby Jack, MD on 10/26/2019      Tyler STAGING: Tyler Staging  Adenocarcinoma of left breast Staging form: Breast, AJCC 7th Edition - Clinical: Stage IIA (T2, N0, cM0) - Signed by Shelby Cancer, PA on 04/16/2012  Clear cell renal cell carcinoma, left ([facility]) Staging form: Kidney, AJCC 8th Edition - Clinical: Stage III (cT3a, cNX, cM0) - Signed by Shelby Jack, MD on 04/13/2019  Lung Tyler [facility]) Staging form: Lung, AJCC 8th Edition - Clinical stage from 10/26/2019: Stage IIB (cT1b, cN1, cM0) - Signed by Shelby Jack, MD on 10/26/2019   INTERVAL HISTORY:  [name], a 81 y.o. female, returns for routine follow-up of her squamous cell carcinoma of left lung and IDA. [name] was last seen on 07/18/2021.   Today she reports feeling well. She reports pain in her abdomen and fatigue; her abdominal pain is unchanged witch eating, and her appetite is good. She is taking 1 iron tablet daily  and it tolerating it well. She denies constipation and reports occasional diarrhea.   REVIEW OF SYSTEMS:  Review of Systems  Constitutional:  Positive for fatigue. Negative for appetite change.  Gastrointestinal:  Positive for abdominal pain (8/10) and diarrhea. Negative for constipation.  Neurological:  Positive for dizziness.  All other systems reviewed and are negative.  PAST MEDICAL/SURGICAL HISTORY:  Past Medical History:  Diagnosis Date   Adenocarcinoma of left breast 04/16/2012   Stage II (T2 N0 M0) left-sided adenocarcinoma breast diagnosed in 1990 by Dr. Romona Tyler and treated by Dr. Juanita Tyler on NSAB PPD-19 with CMF followed by 5 years of tamoxifen and she remains disease free thus far.    Breast Tyler ([place])    left breast/mastectomy/dx approx1990   COPD (chronic obstructive pulmonary disease) ([place])    Gall bladder stones    Grave's disease    Headache    Migraines   High triglycerides    History of kidney stones    small stone  rt    Hyperthyroidism    Nerve compression    pinched in neck   Pneumonia 1990   PONV (postoperative nausea and vomiting)    Sepsis due to Escherichia coli with acute renal failure without septic shock [facility])    Past Surgical History:  Procedure Laterality Date   CARPAL TUNNEL RELEASE     rt. hand   cataract surgery     bilateral   CHOLECYSTECTOMY N/A 05/05/2021   Procedure: LAPAROSCOPIC CHOLECYSTECTOMY;  Surgeon: Clovis Riley,  MD;  Location: [facility];  Service: General;  Laterality: N/A;   COLONOSCOPY     EYE SURGERY     LAPAROSCOPIC NEPHRECTOMY Left 03/25/2019   Procedure: LAPAROSCOPIC RADICAL NEPHRECTOMY;  Surgeon: Ceasar Mons, MD;  Location: [facility];  Service: Urology;  Laterality: Left;   MASTECTOMY     left.   NEPHRECTOMY Left    NODE DISSECTION Left 09/11/2019   Procedure: Node Dissection;  Surgeon: [name], MD;  Location: [facility];  Service: Thoracic;  Laterality: Left;   THORACOTOMY/LOBECTOMY Left  09/11/2019   Procedure: Thoracotomy/Lobectomy Left Upper Lobe;  Surgeon: [name], MD;  Location[facility];  Service: Thoracic;  Laterality: Left;   TONSILLECTOMY      SOCIAL HISTORY:  Social History   Socioeconomic History   Marital status: Divorced    Spouse name: Not on file   Number of children: 2   Years of education: Not on file   Highest education level: Not on file  Occupational History   Occupation: retired  Tobacco Use   Smoking status: Former    Packs/day: 1.00    Years: 60.00    Pack years: 60.00    Types: Cigarettes    Quit date: 09/22/2019    Years since quitting: 2.1   Smokeless tobacco: Never   Tobacco comments:    attempted to stop but its expensive to quit  Vaping Use   Vaping Use: Never used  Substance and Sexual Activity   Alcohol use: No   Drug use: No   Sexual activity: Not on file  Other Topics Concern   Not on file  Social History Narrative   Lives alone - has a basement but doesn't use it   Daughter Rosalio Tyler checks on her daily   Daughter [name] also helps a lot   Social Determinants of Health   Financial Resource Strain: Low Risk    Difficulty of Paying Living Expenses: Not very hard  Food Insecurity: No Food Insecurity   Worried About Charity fundraiser in the Last Year: Never true   [place] in the Last Year: Never true  Transportation Needs: No Transportation Needs   Lack of Transportation (Medical): No   Lack of Transportation (Non-Medical): No  Physical Activity: Insufficiently Active   Days of Exercise per Week: 7 days   Minutes of Exercise per Session: 10 min  Stress: No Stress Concern Present   Feeling of Stress : Not at all  Social Connections: Socially Isolated   Frequency of Communication with Friends and Family: More than three times a week   Frequency of Social Gatherings with Friends and Family: More than three times a week   Attends Religious Services: Never   Marine scientist or  Organizations: No   Attends Archivist Meetings: Never   Marital Status: Widowed  Human resources officer Violence: Not At Risk   Fear of Current or Ex-Partner: No   Emotionally Abused: No   Physically Abused: No   Sexually Abused: No    FAMILY HISTORY:  Family History  Problem Relation Age of Onset   Tyler Father        lung Tyler   Tyler Brother        colon Tyler   Tyler Other        lung Tyler/died age 32    CURRENT MEDICATIONS:  Current Outpatient Medications  Medication Sig Dispense Refill   acetaminophen (TYLENOL) 500 MG tablet Take  500 mg by mouth every 8 (eight) hours as needed for moderate pain.     atorvastatin (LIPITOR) 20 MG tablet Take 1 tablet (20 mg total) by mouth daily. For cholesterol 90 tablet 1   Calcium-Vitamin D (CVS CALCIUM-600/VIT D PO) Take 1 capsule by mouth in the morning and at bedtime.     colestipol (COLESTID) 5 g granules Take 5 g by mouth 2 (two) times daily. 1000 g 3   ferrous sulfate 325 (65 FE) MG EC tablet Take 325 mg by mouth daily.     hydroxypropyl methylcellulose / hypromellose (ISOPTO TEARS / GONIOVISC) 2.5 % ophthalmic solution Place 1 drop into both eyes 3 (three) times daily as needed for dry eyes.      levothyroxine (SYNTHROID) 75 MCG tablet Take 75 mcg by mouth daily before breakfast.     meclizine (ANTIVERT) 25 MG tablet Take 25 mg by mouth daily as needed for dizziness or nausea.     Multiple Vitamins-Minerals (CENTRUM SILVER ULTRA WOMENS) TABS Take 1 tablet by mouth daily.     No current facility-administered medications for this visit.    ALLERGIES:  Allergies  Allergen Reactions   Chantix [Varenicline]     depression    PHYSICAL EXAM:  Performance status (ECOG): 1 - Symptomatic but completely ambulatory  There were no vitals filed for this visit. Wt Readings from Last 3 Encounters:  10/25/21 151 lb 12.8 oz (68.9 kg)  09/26/21 154 lb (69.9 kg)  07/18/21 152 lb 8 oz (69.2 kg)   Physical Exam Vitals  reviewed.  Constitutional:      Appearance: Normal appearance.  Cardiovascular:     Rate and Rhythm: Normal rate and regular rhythm.     Pulses: Normal pulses.     Heart sounds: Normal heart sounds.  Pulmonary:     Effort: Pulmonary effort is normal.     Breath sounds: Normal breath sounds.  Abdominal:     Palpations: There is no hepatomegaly, splenomegaly or mass.     Tenderness: There is abdominal tenderness.  Neurological:     General: No focal deficit present.     Mental Status: She is alert and oriented to person, place, and time.  Psychiatric:        Mood and Affect: Mood normal.        Behavior: Behavior normal.     LABORATORY DATA:  I have reviewed the labs as listed.  CBC Latest Ref Rng & Units 10/23/2021 07/11/2021 04/28/2021  WBC 4.0 - 10.5 K/uL 6.4 5.5 5.3  Hemoglobin 12.0 - 15.0 g/dL 14.7 14.7 13.4  Hematocrit 36.0 - 46.0 % 47.3(H) 46.0 42.7  Platelets 150 - 400 K/uL 287 255 249   CMP Latest Ref Rng & Units 10/23/2021 09/26/2021 07/11/2021  Glucose 70 - 99 mg/dL 101(H) 90 98  BUN 8 - 23 mg/dL 18 12 14   Creatinine 0.44 - 1.00 mg/dL 0.94 0.94 0.93  Sodium 135 - 145 mmol/L 140 143 140  Potassium 3.5 - 5.1 mmol/L 4.4 4.9 4.6  Chloride 98 - 111 mmol/L 105 104 106  CO2 22 - 32 mmol/L 25 23 29   Calcium 8.9 - 10.3 mg/dL 10.1 9.9 9.7  Total Protein 6.5 - 8.1 g/dL 7.9 7.5 8.0  Total Bilirubin 0.3 - 1.2 mg/dL 0.7 0.5 0.9  Alkaline Phos 38 - 126 U/L 90 117 82  AST 15 - 41 U/L 26 27 26   ALT 0 - 44 U/L 19 15 20     DIAGNOSTIC IMAGING:  I have  independently reviewed the scans and discussed with the patient. CT Chest Wo Contrast  Result Date: 10/24/2021 CLINICAL DATA:  Non-small cell lung Tyler. EXAM: CT CHEST WITHOUT CONTRAST TECHNIQUE: Multidetector CT imaging of the chest was performed following the standard protocol without IV contrast. RADIATION DOSE REDUCTION: This exam was performed according to the departmental dose-optimization program which includes automated  exposure control, adjustment of the mA and/or kV according to patient size and/or use of iterative reconstruction technique. COMPARISON:  04/03/2021. FINDINGS: Cardiovascular: Atherosclerotic calcification of the aorta, aortic valve and coronary arteries. Heart size normal. No pericardial effusion. Mediastinum/Nodes: Mediastinal lymph nodes are not enlarged by CT size criteria. Hilar regions are difficult to evaluate without IV contrast. No axillary adenopathy. Numerous surgical clips in the left axilla. Esophagus is grossly unremarkable. Lungs/Pleura: Biapical pleuroparenchymal scarring. Centrilobular and paraseptal emphysema. Scattered pulmonary parenchymal scarring. Left upper lobectomy. No pleural fluid. Airway is otherwise unremarkable. Upper Abdomen: Liver margin is slightly irregular. Visualized portions of the liver and right adrenal gland are otherwise unremarkable. Slightly nodular thickening of the left adrenal gland. Scarring in the right kidney. Visualized portions of the spleen, pancreas, stomach and bowel are grossly unremarkable. Musculoskeletal: Degenerative changes in the spine. No worrisome lytic or sclerotic lesions. Left thoracotomy changes. IMPRESSION: 1. No evidence of recurrent or metastatic disease. 2. Cirrhosis. 3. Aortic atherosclerosis (ICD10-I70.0). Coronary artery calcification. 4.  Emphysema (ICD10-J43.9). Electronically Signed   By: Lorin Picket M.D.   On: 10/24/2021 15:31     ASSESSMENT:  1.  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 -Brain MRI on 12/07/2019 with no metastatic disease. -Adjuvant chemotherapy was not offered based on poor performance status and weight loss. -CT chest on 01/08/2020 did not show any evidence of local recurrence left lung.  Trace loculated medial left pleural effusion.  No evidence of metastatic disease in the chest.  Tiny medial right upper lobe 3 mm solid pulmonary nodule  decreased in the interval, presumably benign. -CT chest with contrast on 06/21/2020 with no evidence of recurrence or metastatic disease.  3 mm nodule in the medial right chest is unchanged. -CT chest with contrast on 09/09/2020 showed stable postoperative changes from left upper lobectomy with no evidence of recurrence or metastasis.   2.  Normocytic anemia: -Feraheme on 03/25/2020 and 04/01/2020.   3.  Left kidney clear cell renal cell carcinoma: -Left radical nephrectomy on 03/25/2019.  Pathology shows RCC, conventional clear cell type, nuclear grade 2 (5.8 cm).  Tumor invades anterior perinephric fat and segmental renal vein (P T3a), ureteral, vascular normal resection margins are negative.   PLAN:  1.  Stage IIb (T1BN1) squamous cell carcinoma of the left upper lobe of the lung: - She does not have any signs or symptoms of recurrence at this time. - We reviewed CT chest from 10/24/2021 which showed no evidence of recurrence or metastatic disease.  New finding of cirrhosis was seen. - She also reports some epigastric pain, no relation to food.  She is referred to GI for work-up of cirrhosis. - Recommend follow-up in 6 months with repeat CT of the chest without contrast.   2.  Normocytic anemia: - Hemoglobin is 14.7.  Ferritin is 40 and percent saturation 22. - Continue iron tablet daily.  She is tolerating well.  Occasional diarrhea is stable.   Orders placed this encounter:  No orders of the defined types were placed in this encounter.    Dirk Dress  Delton [place], [place] [phone]   I, Thana Ates, am acting as a scribe for Dr. Derek Tyler.  I, Shelby Jack MD, have reviewed the above documentation for accuracy and completeness, and I agree with the above.

## 2021-11-13 ENCOUNTER — Telehealth: Payer: Self-pay | Admitting: Family Medicine

## 2021-11-13 NOTE — Telephone Encounter (Signed)
Left message to call back  

## 2021-11-13 NOTE — Telephone Encounter (Signed)
Please call patients daughter [name]) about patients swollen leg. ? ?Triage currently unavailable. ?

## 2021-11-14 ENCOUNTER — Encounter: Payer: Self-pay | Admitting: Family Medicine

## 2021-11-14 ENCOUNTER — Ambulatory Visit (INDEPENDENT_AMBULATORY_CARE_PROVIDER_SITE_OTHER): Payer: Medicare PPO | Admitting: Family Medicine

## 2021-11-14 VITALS — BP 140/76 | HR 85 | Temp 97.1°F | Ht 65.0 in | Wt 157.0 lb

## 2021-11-14 DIAGNOSIS — R600 Localized edema: Secondary | ICD-10-CM | POA: Diagnosis not present

## 2021-11-14 NOTE — Progress Notes (Signed)
? ?Subjective:  ?Patient ID: [name], female    DOB: 19-Apr-1941  Age: 81 y.o. MRN: [id number] ? ?CC: Foot Swelling (RIGHT FOOT SWELLING AND DISCOLORATION, NO KNOWN INJURY) ? ? ?HPI ?[name] presents for sudden onset of edema of the right leg. There was no injury. It started three days ago. She denies dyspnea. There is no pain. She has no swelling on the left.  ? ?Depression screen [facility] 2/9 11/14/2021 10/25/2021 09/29/2021  ?Decreased Interest 0 0 0  ?Down, Depressed, Hopeless 0 0 0  ?PHQ - 2 Score 0 0 0  ?Altered sleeping - - 0  ?Tired, decreased energy - - 2  ?Change in appetite - - 0  ?Feeling bad or failure about yourself  - - 0  ?Trouble concentrating - - 1  ?Moving slowly or fidgety/restless - - 1  ?Suicidal thoughts - - 0  ?PHQ-9 Score - - 4  ?Difficult doing work/chores - - Very difficult  ? ? ?History ?[name] has a past medical history of Adenocarcinoma of left breast (04/16/2012), Breast cancer ([place]), COPD (chronic obstructive pulmonary disease) ([place]), Gall bladder stones, Grave's disease, Headache, High triglycerides, History of kidney stones, Hyperthyroidism, Nerve compression, Pneumonia (1990), PONV (postoperative nausea and vomiting), and Sepsis due to Escherichia coli with acute renal failure without septic shock ([place]).  ? ?She has a past surgical history that includes Mastectomy; Carpal tunnel release; cataract surgery; Tonsillectomy; Eye surgery; Laparoscopic nephrectomy (Left, 03/25/2019); Colonoscopy; Node dissection (Left, 09/11/2019); Thoracotomy/lobectomy (Left, 09/11/2019); Nephrectomy (Left); and Cholecystectomy (N/A, 05/05/2021).  ? ?Her family history includes Cancer in her brother, father, and another family member.She reports that she quit smoking about 2 years ago. Her smoking use included cigarettes. She has a 60.00 pack-year smoking history. She has never used smokeless tobacco. She reports that she does not drink alcohol and does not use drugs. ? ? ? ?ROS ?Review of Systems   ?Constitutional: Negative.   ?HENT: Negative.    ?Eyes:  Negative for visual disturbance.  ?Respiratory:  Negative for shortness of breath.   ?Cardiovascular:  Positive for leg swelling. Negative for chest pain.  ?Gastrointestinal:  Negative for abdominal pain.  ?Musculoskeletal:  Negative for arthralgias.  ?Neurological:  Negative for weakness and numbness.  ? ?Objective:  ?BP 140/76   Pulse 85   Temp (!) 97.1 ?F (36.2 ?C)   Ht '5\' 5"'  (1.651 m)   Wt 157 lb (71.2 kg)   SpO2 95%   BMI 26.13 kg/m?  ? ?BP Readings from Last 3 Encounters:  ?11/14/21 140/76  ?10/30/21 124/67  ?10/25/21 116/66  ? ? ?Wt Readings from Last 3 Encounters:  ?11/14/21 157 lb (71.2 kg)  ?10/30/21 155 lb 9.6 oz (70.6 kg)  ?10/25/21 151 lb 12.8 oz (68.9 kg)  ? ? ? ?Physical Exam ?Constitutional:   ?   General: She is not in acute distress. ?   Appearance: She is well-developed.  ?Cardiovascular:  ?   Rate and Rhythm: Normal rate and regular rhythm.  ?Pulmonary:  ?   Breath sounds: Normal breath sounds.  ?Musculoskeletal:     ?   General: Normal range of motion.  ?Skin: ?   General: Skin is warm and dry.  ?Neurological:  ?   Mental Status: She is alert and oriented to person, place, and time.  ? ? ? ? ?Assessment & Plan:  ? ?[name] was seen today for foot swelling. ? ?Diagnoses and all orders for this visit: ? ?Leg edema, right ?-  CMP14+EGFR ?-     Brain natriuretic peptide ?-     CBC with Differential/Platelet ?-     US Venous Img Lower Unilateral Right; Future ? ? ? ? ? ? ?I am having [name] maintain her Centrum Silver Ultra Womens, hydroxypropyl methylcellulose / hypromellose, meclizine, ferrous sulfate, Calcium-Vitamin D (CVS CALCIUM-600/VIT D PO), acetaminophen, levothyroxine, and atorvastatin. ? ?Allergies as of 11/14/2021   ? ?   Reactions  ? Chantix [varenicline]   ? depression  ? ?  ? ?  ?Medication List  ?  ? ?  ? Accurate as of November 14, 2021  8:42 PM. If you have any questions, ask your nurse or doctor.  ?  ?  ? ?   ? ?acetaminophen 500 MG tablet ?Commonly known as: TYLENOL ?Take 500 mg by mouth every 8 (eight) hours as needed for moderate pain. ?  ?atorvastatin 20 MG tablet ?Commonly known as: LIPITOR ?Take 1 tablet (20 mg total) by mouth daily. For cholesterol ?  ?Centrum Silver Ultra Womens Tabs ?Take 1 tablet by mouth daily. ?  ?CVS CALCIUM-600/VIT D PO ?Take 1 capsule by mouth in the morning and at bedtime. ?  ?ferrous sulfate 325 (65 FE) MG EC tablet ?Take 325 mg by mouth daily. ?  ?hydroxypropyl methylcellulose / hypromellose 2.5 % ophthalmic solution ?Commonly known as: ISOPTO TEARS / GONIOVISC ?Place 1 drop into both eyes 3 (three) times daily as needed for dry eyes. ?  ?levothyroxine 75 MCG tablet ?Commonly known as: SYNTHROID ?Take 75 mcg by mouth daily before breakfast. ?  ?meclizine 25 MG tablet ?Commonly known as: ANTIVERT ?Take 25 mg by mouth daily as needed for dizziness or nausea. ?  ? ?  ? ? ? ?Follow-up: No follow-ups on file. ? ?Claretta Fraise, M.D. ?

## 2021-11-15 ENCOUNTER — Other Ambulatory Visit: Payer: Self-pay | Admitting: Family Medicine

## 2021-11-15 ENCOUNTER — Ambulatory Visit: Payer: Medicare PPO | Admitting: Family Medicine

## 2021-11-15 DIAGNOSIS — R600 Localized edema: Secondary | ICD-10-CM

## 2021-11-15 LAB — BRAIN NATRIURETIC PEPTIDE: BNP: 39.8 pg/mL (ref 0.0–100.0)

## 2021-11-15 LAB — CMP14+EGFR
ALT: 15 IU/L (ref 0–32)
AST: 17 IU/L (ref 0–40)
Albumin/Globulin Ratio: 1.8 (ref 1.2–2.2)
Albumin: 4.5 g/dL (ref 3.7–4.7)
Alkaline Phosphatase: 110 IU/L (ref 44–121)
BUN/Creatinine Ratio: 18 (ref 12–28)
BUN: 18 mg/dL (ref 8–27)
Bilirubin Total: <0.2 mg/dL (ref 0.0–1.2)
CO2: 25 mmol/L (ref 20–29)
Calcium: 9.3 mg/dL (ref 8.7–10.3)
Chloride: 106 mmol/L (ref 96–106)
Creatinine, Ser: 0.99 mg/dL (ref 0.57–1.00)
Globulin, Total: 2.5 g/dL (ref 1.5–4.5)
Glucose: 98 mg/dL (ref 70–99)
Potassium: 4.6 mmol/L (ref 3.5–5.2)
Sodium: 145 mmol/L — ABNORMAL HIGH (ref 134–144)
Total Protein: 7 g/dL (ref 6.0–8.5)
eGFR: 58 mL/min/{1.73_m2} — ABNORMAL LOW (ref >59)

## 2021-11-15 LAB — CBC WITH DIFFERENTIAL/PLATELET
Basophils Absolute: 0.1 10*3/uL (ref 0.0–0.2)
Basos: 1 %
EOS (ABSOLUTE): 0.2 10*3/uL (ref 0.0–0.4)
Eos: 4 %
Hematocrit: 41.3 % (ref 34.0–46.6)
Hemoglobin: 13.6 g/dL (ref 11.1–15.9)
Immature Grans (Abs): 0 10*3/uL (ref 0.0–0.1)
Immature Granulocytes: 0 %
Lymphocytes Absolute: 1.4 10*3/uL (ref 0.7–3.1)
Lymphs: 23 %
MCH: 30.8 pg (ref 26.6–33.0)
MCHC: 32.9 g/dL (ref 31.5–35.7)
MCV: 93 fL (ref 79–97)
Monocytes Absolute: 0.4 10*3/uL (ref 0.1–0.9)
Monocytes: 7 %
Neutrophils Absolute: 4 10*3/uL (ref 1.4–7.0)
Neutrophils: 65 %
Platelets: 298 10*3/uL (ref 150–450)
RBC: 4.42 x10E6/uL (ref 3.77–5.28)
RDW: 12.6 % (ref 11.7–15.4)
WBC: 6.1 10*3/uL (ref 3.4–10.8)

## 2021-11-15 NOTE — Progress Notes (Signed)
Hello [name], ? ?Your lab result is normal and/or stable.Some minor variations that are not significant are commonly marked abnormal, but do not represent any medical problem for you. ? ?Best regards, ?Claretta Fraise, M.D.

## 2021-11-15 NOTE — Telephone Encounter (Signed)
PATIENT SEEN IN OFFICE 3/14 FOR THIS ?

## 2021-11-16 ENCOUNTER — Ambulatory Visit (HOSPITAL_COMMUNITY)
Admission: RE | Admit: 2021-11-16 | Discharge: 2021-11-16 | Disposition: A | Discharge status: Home / Self Care | Payer: Medicare PPO | Source: Ambulatory Visit | Attending: Family Medicine | Admitting: Family Medicine

## 2021-11-16 ENCOUNTER — Other Ambulatory Visit: Payer: Self-pay

## 2021-11-16 DIAGNOSIS — R600 Localized edema: Secondary | ICD-10-CM | POA: Insufficient documentation

## 2022-01-02 ENCOUNTER — Encounter (INDEPENDENT_AMBULATORY_CARE_PROVIDER_SITE_OTHER): Payer: Self-pay | Admitting: Gastroenterology

## 2022-01-02 ENCOUNTER — Ambulatory Visit (INDEPENDENT_AMBULATORY_CARE_PROVIDER_SITE_OTHER): Payer: Medicare PPO | Admitting: Gastroenterology

## 2022-01-02 VITALS — BP 152/82 | HR 84 | Temp 98.0°F | Ht 65.5 in | Wt 151.5 lb

## 2022-01-02 DIAGNOSIS — R1010 Upper abdominal pain, unspecified: Secondary | ICD-10-CM | POA: Diagnosis not present

## 2022-01-02 DIAGNOSIS — R932 Abnormal findings on diagnostic imaging of liver and biliary tract: Secondary | ICD-10-CM | POA: Diagnosis not present

## 2022-01-02 DIAGNOSIS — R197 Diarrhea, unspecified: Secondary | ICD-10-CM | POA: Insufficient documentation

## 2022-01-02 DIAGNOSIS — Z862 Personal history of diseases of the blood and blood-forming organs and certain disorders involving the immune mechanism: Secondary | ICD-10-CM

## 2022-01-02 HISTORY — DX: Diarrhea, unspecified: R19.7

## 2022-01-02 MED ORDER — DICYCLOMINE HCL 10 MG PO CAPS: 10.0000 mg | ORAL_CAPSULE | Freq: Three times a day (TID) | ORAL | 1 refills | Status: DC | PRN

## 2022-01-02 NOTE — Progress Notes (Signed)
? ?Referring Provider: Claretta Fraise, MD ?Primary Care Physician:  Claretta Fraise, MD ?Primary GI Physician: new ? ?Chief Complaint  ?Patient presents with  ? New Patient (Initial Visit)  ?  For Cirrhosis and Diarrhea and upper belly pain every day or every other she is having 5 to 6 runny bowel movements a day,negative FOBT takes a 1/2 tablet of immodium occasionally . States she has had these symptoms for 3 years   ? ?HPI:   ?[name] is a 81 y.o. female with past medical history of breast cancer s/p L mastectomy, COPD, Grave's disease, elevated triglycerides. ? ?Patient presenting today as a new patient for diarrhea and ?cirrhosis. Patient arrives with her daughter [name] who helps provide history. ? ?Possible cirrhosis:CT chest w/o contrast done 10/23/21 with findings of Liver margin is slightly irregular. Visualized portions of the liver otherwise unremarkable. Notably, [place] abd limited with hepatic steatosis in may 2022. Last labs with AST 26, ALT 19, ALk Phos 90, T bili 0.7. No hx of etoh use.  ? ?Diarrhea: C diff, O&P and Stool culture done in January 2023 without findings of infection. T3 2.5, TSH 1.31, Free T4 1.24 in January 2023 with normal electrolytes on CMP. Has been having diarrhea for the past 3 years. She reports that she has 4-5 BMs per day, stools are very watery in nature. Denies any rectal bleeding, has darker stools but is on iron pills.  Had gallbladder removed in September of last year, notes no worsening of diarrhea thereafter.  Takes 1/2 tablet of imodium just occasionally which will slow down her diarrhea some.  ? ?Abdominal pain: Denies any issues with heartburn or acid reflux. Reports some upper abdominal pain. States that it feels crampy in nature, denies any alleviating or precipitating factors. Unsure how often it occurs Denies any post prandial abdominal pain. No early satiety or bloating. Denies any nausea or vomiting.  ? ?IDA: states that this began around the time she had  left upper lung lobectomy in January 2021, states that she was very fatigued with low energy, did Feraheme in the in the past but actually felt worse on that so she started on iron pills which she continues on, tolerates them well. She endorses ongoing fatigue. Has some dizziness on occasion  that she thinks is related to her inner ear issues. Has not had endoscopic evaluation to determine etiology of her IDA. Last hgb was WNL at 13.6 with MCV 93 in March 2023, with normal iron studies in Feb 2023, Ferritin 40, Iron 84, TIBC 378, saturation 22%, notably per chart review, patient has had slight anemia since atleast early 2020, though lowest appears to be around 8 in early 2021 ? ?NSAID use: only tylenol ?Social hx: no etoh or tobacco  ?Fam hx: brother had CRC, diagnosed after age 62 ? ?Last Colonoscopy:at least 3 years ago, unsure where it was done or findings ?Last Endoscopy:never ? ?Recommendations:  ? ? ?Past Medical History:  ?Diagnosis Date  ? Adenocarcinoma of left breast 04/16/2012  ? Stage II (T2 N0 M0) left-sided adenocarcinoma breast diagnosed in 1990 by Dr. Romona Curls and treated by Dr. Juanita Craver on NSAB PPD-19 with CMF followed by 5 years of tamoxifen and she remains disease free thus far.   ? Breast cancer ([place])   ? left breast/mastectomy/dx approx1990  ? COPD (chronic obstructive pulmonary disease) ([place])   ? Gall bladder stones   ? Grave's disease   ? Headache   ? Migraines  ? High triglycerides   ?  History of kidney stones   ? small stone  rt   ? Hyperthyroidism   ? Nerve compression   ? pinched in neck  ? Pneumonia 1990  ? PONV (postoperative nausea and vomiting)   ? Sepsis due to Escherichia coli with acute renal failure without septic shock (HCC)   ? ? ?Past Surgical History:  ?Procedure Laterality Date  ? CARPAL TUNNEL RELEASE    ? rt. hand  ? cataract surgery    ? bilateral  ? CHOLECYSTECTOMY N/A 05/05/2021  ? Procedure: LAPAROSCOPIC CHOLECYSTECTOMY;  Surgeon: Clovis Riley, MD;  Location: [facility];  Service: General;  Laterality: N/A;  ? COLONOSCOPY    ? EYE SURGERY    ? LAPAROSCOPIC NEPHRECTOMY Left 03/25/2019  ? Procedure: LAPAROSCOPIC RADICAL NEPHRECTOMY;  Surgeon: Ceasar Mons, MD;  Location: [facility];  Service: Urology;  Laterality: Left;  ? MASTECTOMY    ? left.  ? NEPHRECTOMY Left   ? NODE DISSECTION Left 09/11/2019  ? Procedure: Node Dissection;  Surgeon: [name], MD;  Location: [facility];  Service: Thoracic;  Laterality: Left;  ? THORACOTOMY/LOBECTOMY Left 09/11/2019  ? Procedure: Thoracotomy/Lobectomy Left Upper Lobe;  Surgeon: [name], MD;  Location: [facility];  Service: Thoracic;  Laterality: Left;  ? TONSILLECTOMY    ? ? ?Current Outpatient Medications  ?Medication Sig Dispense Refill  ? acetaminophen (TYLENOL) 500 MG tablet Take 500 mg by mouth every 8 (eight) hours as needed for moderate pain.    ? atorvastatin (LIPITOR) 20 MG tablet Take 1 tablet (20 mg total) by mouth daily. For cholesterol 90 tablet 1  ? Calcium-Vitamin D (CVS CALCIUM-600/VIT D PO) Take 1 capsule by mouth in the morning and at bedtime.    ? ferrous sulfate 325 (65 FE) MG EC tablet Take 325 mg by mouth daily.    ? hydroxypropyl methylcellulose / hypromellose (ISOPTO TEARS / GONIOVISC) 2.5 % ophthalmic solution Place 1 drop into both eyes 3 (three) times daily as needed for dry eyes.     ? levothyroxine (SYNTHROID) 75 MCG tablet Take 75 mcg by mouth daily before breakfast.    ? meclizine (ANTIVERT) 25 MG tablet Take 25 mg by mouth daily as needed for dizziness or nausea.    ? Multiple Vitamins-Minerals (CENTRUM SILVER ULTRA WOMENS) TABS Take 1 tablet by mouth daily.    ? ?No current facility-administered medications for this visit.  ? ? ?Allergies as of 01/02/2022 - Review Complete 11/14/2021  ?Allergen Reaction Noted  ? Chantix [varenicline]  09/09/2019  ? ? ?Family History  ?Problem Relation Age of Onset  ? Cancer Father   ?     lung cancer  ? Cancer Brother   ?     colon cancer  ? Cancer  Other   ?     lung cancer/died age 16  ? ? ?Social History  ? ?Socioeconomic History  ? Marital status: Divorced  ?  Spouse name: Not on file  ? Number of children: 2  ? Years of education: Not on file  ? Highest education level: Not on file  ?Occupational History  ? Occupation: retired  ?Tobacco Use  ? Smoking status: Former  ?  Packs/day: 1.00  ?  Years: 60.00  ?  Pack years: 60.00  ?  Types: Cigarettes  ?  Quit date: 09/22/2019  ?  Years since quitting: 2.2  ? Smokeless tobacco: Never  ? Tobacco comments:  ?  attempted to stop but its expensive to quit  ?  Vaping Use  ? Vaping Use: Never used  ?Substance and Sexual Activity  ? Alcohol use: No  ? Drug use: No  ? Sexual activity: Not on file  ?Other Topics Concern  ? Not on file  ?Social History Narrative  ? Lives alone - has a basement but doesn't use it  ? Daughter [name] checks on her daily  ? Daughter [name] also helps a lot  ? ?Social Determinants of Health  ? ?Financial Resource Strain: Low Risk   ? Difficulty of Paying Living Expenses: Not very hard  ?Food Insecurity: No Food Insecurity  ? Worried About Charity fundraiser in the Last Year: Never true  ? Ran Out of Food in the Last Year: Never true  ?Transportation Needs: No Transportation Needs  ? Lack of Transportation (Medical): No  ? Lack of Transportation (Non-Medical): No  ?Physical Activity: Insufficiently Active  ? Days of Exercise per Week: 7 days  ? Minutes of Exercise per Session: 10 min  ?Stress: No Stress Concern Present  ? Feeling of Stress : Not at all  ?Social Connections: Socially Isolated  ? Frequency of Communication with Friends and Family: More than three times a week  ? Frequency of Social Gatherings with Friends and Family: More than three times a week  ? Attends Religious Services: Never  ? Active Member of Clubs or Organizations: No  ? Attends Archivist Meetings: Never  ? Marital Status: Widowed  ? ? ?Review of systems ?General: negative for malaise, night sweats,  fever, chills, weight los ?Neck: Negative for lumps, goiter, pain and significant neck swelling ?Resp: Negative for cough, wheezing, dyspnea at rest ?CV: Negative for chest pain, leg swelling, palpitations, o

## 2022-01-02 NOTE — Patient Instructions (Signed)
We will check celiac panel to rule out celiac disease as cause of your diarrhea ?I am ordering a special ultrasound to better determine if you do in fact have cirrhosis or if this is just fatty liver with some stiffening. I will be in touch once I have these results. ?I have sent dicyclomine 10mg  for you to take up to three times per day as needed for diarrhea, you can try scheduled dosing and do this about 30 minutes prior to meals if you are tolerating it okay. ? ?Please let me know if you would like to proceed with EGD/Colonoscopy ? ?Follow up 3 months ?

## 2022-01-04 DIAGNOSIS — R1010 Upper abdominal pain, unspecified: Secondary | ICD-10-CM | POA: Insufficient documentation

## 2022-01-04 DIAGNOSIS — Z862 Personal history of diseases of the blood and blood-forming organs and certain disorders involving the immune mechanism: Secondary | ICD-10-CM | POA: Insufficient documentation

## 2022-01-05 ENCOUNTER — Ambulatory Visit (HOSPITAL_COMMUNITY)
Admission: RE | Admit: 2022-01-05 | Discharge: 2022-01-05 | Disposition: A | Discharge status: Home / Self Care | Payer: Medicare PPO | Source: Ambulatory Visit | Attending: Gastroenterology | Admitting: Gastroenterology

## 2022-01-05 DIAGNOSIS — R932 Abnormal findings on diagnostic imaging of liver and biliary tract: Secondary | ICD-10-CM | POA: Diagnosis not present

## 2022-01-05 DIAGNOSIS — K7689 Other specified diseases of liver: Secondary | ICD-10-CM | POA: Diagnosis not present

## 2022-01-05 LAB — CELIAC DISEASE PANEL
Endomysial IgA: NEGATIVE
IgA/Immunoglobulin A, Serum: 154 mg/dL (ref 64–422)
Transglutaminase IgA: <2 U/mL (ref 0–3)

## 2022-01-23 ENCOUNTER — Ambulatory Visit (INDEPENDENT_AMBULATORY_CARE_PROVIDER_SITE_OTHER): Payer: Medicare PPO | Admitting: Family Medicine

## 2022-01-23 ENCOUNTER — Encounter: Payer: Self-pay | Admitting: Family Medicine

## 2022-01-23 VITALS — BP 112/62 | HR 84 | Temp 98.2°F | Ht 65.5 in | Wt 146.0 lb

## 2022-01-23 DIAGNOSIS — D508 Other iron deficiency anemias: Secondary | ICD-10-CM

## 2022-01-23 DIAGNOSIS — E039 Hypothyroidism, unspecified: Secondary | ICD-10-CM

## 2022-01-23 DIAGNOSIS — E782 Mixed hyperlipidemia: Secondary | ICD-10-CM | POA: Diagnosis not present

## 2022-01-23 DIAGNOSIS — Z23 Encounter for immunization: Secondary | ICD-10-CM | POA: Diagnosis not present

## 2022-01-23 NOTE — Progress Notes (Signed)
 Subjective:  Patient ID: [name], female    DOB: 08/31/1941  Age: 81 y.o. MRN: [id number]  CC: Medical Management of Chronic Issues   HPI [name] presents for saw GI. Got dicyclomine. Made her itch. USing immodium with good relief. Abdominal pain described as a cramp. Points to LUQ. Has always had it.    follow-up on  thyroid. The patient has a history of hypothyroidism for many years. It has been stable recently. Pt. denies any change in  voice, loss of hair, heat or cold intolerance. Energy level has been adequate to good. Patient denies constipation and diarrhea. No myxedema. Medication is as noted below. Verified that pt is taking it daily on an empty stomach. Well tolerated.  in for follow-up of elevated cholesterol. Doing well without complaints on current medication. Denies side effects of statin including myalgia and arthralgia and nausea. Currently no chest pain, shortness of breath or other cardiovascular related symptoms noted.      01/23/2022    9:06 AM 01/23/2022    8:56 AM 11/14/2021    4:32 PM  Depression screen PHQ 2/9  Decreased Interest 0 0 0  Down, Depressed, Hopeless 0 0 0  PHQ - 2 Score 0 0 0    History [name] has a past medical history of Adenocarcinoma of left breast (04/16/2012), Breast cancer (HCC), COPD (chronic obstructive pulmonary disease) (HCC), Gall bladder stones, Grave's disease, Headache, High triglycerides, History of kidney stones, Hyperthyroidism, Nerve compression, Pneumonia (1990), PONV (postoperative nausea and vomiting), and Sepsis due to Escherichia coli with acute renal failure without septic shock (HCC).   She has a past surgical history that includes Mastectomy; Carpal tunnel release; cataract surgery; Tonsillectomy; Eye surgery; Laparoscopic nephrectomy (Left, 03/25/2019); Colonoscopy; Node dissection (Left, 09/11/2019); Thoracotomy/lobectomy (Left, 09/11/2019); Nephrectomy (Left); and Cholecystectomy (N/A, 05/05/2021).   Her  family history includes Cancer in her brother, father, and another family member.She reports that she quit smoking about 2 years ago. Her smoking use included cigarettes. She has a 60.00 pack-year smoking history. She has never used smokeless tobacco. She reports that she does not drink alcohol and does not use drugs.    ROS Review of Systems  Constitutional: Negative.   HENT: Negative.    Eyes:  Negative for visual disturbance.  Respiratory:  Negative for shortness of breath.   Cardiovascular:  Negative for chest pain.  Gastrointestinal:  Positive for abdominal pain (LUQ cramps - at baseline) and diarrhea (chronic. Controlled with qod immodium).  Musculoskeletal:  Negative for arthralgias.   Objective:  BP 112/62   Pulse 84   Temp 98.2 F (36.8 C)   Ht 5' 5.5" (1.664 m)   Wt 146 lb (66.2 kg)   SpO2 94%   BMI 23.93 kg/m   BP Readings from Last 3 Encounters:  01/23/22 112/62  01/02/22 (!) 152/82  11/14/21 140/76    Wt Readings from Last 3 Encounters:  01/23/22 146 lb (66.2 kg)  01/02/22 151 lb 8 oz (68.7 kg)  11/14/21 157 lb (71.2 kg)     Physical Exam Constitutional:      General: She is not in acute distress.    Appearance: She is well-developed.  HENT:     Head: Normocephalic and atraumatic.  Eyes:     Conjunctiva/sclera: Conjunctivae normal.     Pupils: Pupils are equal, round, and reactive to light.  Neck:     Thyroid: No thyromegaly.  Cardiovascular:     Rate and Rhythm: Normal rate and regular   rhythm.     Heart sounds: Normal heart sounds. No murmur heard. Pulmonary:     Effort: Pulmonary effort is normal. No respiratory distress.     Breath sounds: Normal breath sounds. No wheezing or rales.  Abdominal:     General: Bowel sounds are normal. There is no distension.     Palpations: Abdomen is soft.     Tenderness: There is no abdominal tenderness.  Musculoskeletal:        General: Normal range of motion.     Cervical back: Normal range of motion and  neck supple.  Lymphadenopathy:     Cervical: No cervical adenopathy.  Skin:    General: Skin is warm and dry.  Neurological:     Mental Status: She is alert and oriented to person, place, and time.  Psychiatric:        Behavior: Behavior normal.        Thought Content: Thought content normal.        Judgment: Judgment normal.      Assessment & Plan:   [name] was seen today for medical management of chronic issues.  Diagnoses and all orders for this visit:  Other iron deficiency anemia -     CBC with Differential/Platelet -     CMP14+EGFR -     Iron, TIBC and Ferritin Panel  Hypothyroidism, unspecified type -     Lipid panel -     TSH + free T4  Mixed hyperlipidemia       I have discontinued [name]'s dicyclomine. I am also having her maintain her Centrum Silver Ultra Womens, hydroxypropyl methylcellulose / hypromellose, meclizine, ferrous sulfate, Calcium-Vitamin D (CVS CALCIUM-600/VIT D PO), acetaminophen, levothyroxine, and atorvastatin.  Allergies as of 01/23/2022       Reactions   Dicyclomine Hcl Itching   Chantix [varenicline]    depression        Medication List        Accurate as of Jan 23, 2022  9:28 AM. If you have any questions, ask your nurse or doctor.          STOP taking these medications    dicyclomine 10 MG capsule Commonly known as: BENTYL Stopped by: Warren Stacks, MD       TAKE these medications    acetaminophen 500 MG tablet Commonly known as: TYLENOL Take 500 mg by mouth every 8 (eight) hours as needed for moderate pain.   atorvastatin 20 MG tablet Commonly known as: LIPITOR Take 1 tablet (20 mg total) by mouth daily. For cholesterol   Centrum Silver Ultra Womens Tabs Take 1 tablet by mouth daily.   CVS CALCIUM-600/VIT D PO Take 1 capsule by mouth in the morning and at bedtime.   ferrous sulfate 325 (65 FE) MG EC tablet Take 325 mg by mouth daily.   hydroxypropyl methylcellulose / hypromellose 2.5 %  ophthalmic solution Commonly known as: ISOPTO TEARS / GONIOVISC Place 1 drop into both eyes 3 (three) times daily as needed for dry eyes.   levothyroxine 75 MCG tablet Commonly known as: SYNTHROID Take 75 mcg by mouth daily before breakfast.   meclizine 25 MG tablet Commonly known as: ANTIVERT Take 25 mg by mouth daily as needed for dizziness or nausea.         Follow-up: Return in about 6 months (around 07/26/2022), or if symptoms worsen or fail to improve, for Hypothyroidism, LUQ pain.  Warren Stacks, M.D. 

## 2022-01-23 NOTE — Addendum Note (Signed)
Addended by: Christia Reading on: 01/23/2022 02:49 PM   Modules accepted: Orders

## 2022-01-24 LAB — CBC WITH DIFFERENTIAL/PLATELET
Basophils Absolute: 0 10*3/uL (ref 0.0–0.2)
Basos: 1 %
EOS (ABSOLUTE): 0.2 10*3/uL (ref 0.0–0.4)
Eos: 3 %
Hematocrit: 40 % (ref 34.0–46.6)
Hemoglobin: 13.2 g/dL (ref 11.1–15.9)
Immature Grans (Abs): 0 10*3/uL (ref 0.0–0.1)
Immature Granulocytes: 0 %
Lymphocytes Absolute: 0.9 10*3/uL (ref 0.7–3.1)
Lymphs: 15 %
MCH: 30.8 pg (ref 26.6–33.0)
MCHC: 33 g/dL (ref 31.5–35.7)
MCV: 94 fL (ref 79–97)
Monocytes Absolute: 0.4 10*3/uL (ref 0.1–0.9)
Monocytes: 7 %
Neutrophils Absolute: 4.1 10*3/uL (ref 1.4–7.0)
Neutrophils: 74 %
Platelets: 283 10*3/uL (ref 150–450)
RBC: 4.28 x10E6/uL (ref 3.77–5.28)
RDW: 12.7 % (ref 11.7–15.4)
WBC: 5.6 10*3/uL (ref 3.4–10.8)

## 2022-01-24 LAB — TSH+FREE T4
Free T4: 1.22 ng/dL (ref 0.82–1.77)
TSH: 7.72 u[IU]/mL — ABNORMAL HIGH (ref 0.450–4.500)

## 2022-01-24 LAB — CMP14+EGFR
ALT: 12 IU/L (ref 0–32)
AST: 15 IU/L (ref 0–40)
Albumin/Globulin Ratio: 1.6 (ref 1.2–2.2)
Albumin: 4.8 g/dL — ABNORMAL HIGH (ref 3.7–4.7)
Alkaline Phosphatase: 135 IU/L — ABNORMAL HIGH (ref 44–121)
BUN/Creatinine Ratio: 14 (ref 12–28)
BUN: 17 mg/dL (ref 8–27)
Bilirubin Total: 0.4 mg/dL (ref 0.0–1.2)
CO2: 23 mmol/L (ref 20–29)
Calcium: 9.8 mg/dL (ref 8.7–10.3)
Chloride: 103 mmol/L (ref 96–106)
Creatinine, Ser: 1.25 mg/dL — ABNORMAL HIGH (ref 0.57–1.00)
Globulin, Total: 3 g/dL (ref 1.5–4.5)
Glucose: 100 mg/dL — ABNORMAL HIGH (ref 70–99)
Potassium: 4.7 mmol/L (ref 3.5–5.2)
Sodium: 143 mmol/L (ref 134–144)
Total Protein: 7.8 g/dL (ref 6.0–8.5)
eGFR: 44 mL/min/{1.73_m2} — ABNORMAL LOW (ref >59)

## 2022-01-24 LAB — IRON,TIBC AND FERRITIN PANEL
Ferritin: 108 ng/mL (ref 15–150)
Iron Saturation: 18 % (ref 15–55)
Iron: 55 ug/dL (ref 27–139)
Total Iron Binding Capacity: 305 ug/dL (ref 250–450)
UIBC: 250 ug/dL (ref 118–369)

## 2022-01-24 LAB — LIPID PANEL
Chol/HDL Ratio: 3.2 ratio (ref 0.0–4.4)
Cholesterol, Total: 160 mg/dL (ref 100–199)
HDL: 50 mg/dL (ref >39)
LDL Chol Calc (NIH): 74 mg/dL (ref 0–99)
Triglycerides: 217 mg/dL — ABNORMAL HIGH (ref 0–149)
VLDL Cholesterol Cal: 36 mg/dL (ref 5–40)

## 2022-01-25 ENCOUNTER — Other Ambulatory Visit: Payer: Self-pay | Admitting: Family Medicine

## 2022-01-25 ENCOUNTER — Other Ambulatory Visit: Payer: Self-pay | Admitting: *Deleted

## 2022-01-25 DIAGNOSIS — E039 Hypothyroidism, unspecified: Secondary | ICD-10-CM

## 2022-01-25 MED ORDER — LEVOTHYROXINE SODIUM 88 MCG PO TABS: 88.0000 ug | ORAL_TABLET | Freq: Every day | ORAL | 1 refills | Status: DC

## 2022-01-31 ENCOUNTER — Telehealth: Payer: Self-pay | Admitting: Family Medicine

## 2022-01-31 NOTE — Telephone Encounter (Signed)
Daughter aware of results and verbalizes understanding per dpr.

## 2022-02-24 ENCOUNTER — Other Ambulatory Visit (INDEPENDENT_AMBULATORY_CARE_PROVIDER_SITE_OTHER): Payer: Self-pay | Admitting: Gastroenterology

## 2022-03-09 ENCOUNTER — Other Ambulatory Visit: Payer: Medicare PPO

## 2022-03-09 DIAGNOSIS — R944 Abnormal results of kidney function studies: Secondary | ICD-10-CM | POA: Diagnosis not present

## 2022-03-09 DIAGNOSIS — E039 Hypothyroidism, unspecified: Secondary | ICD-10-CM | POA: Diagnosis not present

## 2022-03-10 LAB — TSH+FREE T4
Free T4: 1.3 ng/dL (ref 0.82–1.77)
TSH: 4 u[IU]/mL (ref 0.450–4.500)

## 2022-03-12 ENCOUNTER — Ambulatory Visit (INDEPENDENT_AMBULATORY_CARE_PROVIDER_SITE_OTHER): Payer: Medicare PPO | Admitting: Family Medicine

## 2022-03-12 ENCOUNTER — Encounter: Payer: Self-pay | Admitting: Family Medicine

## 2022-03-12 VITALS — BP 122/71 | HR 91 | Temp 97.8°F | Ht 65.5 in | Wt 139.4 lb

## 2022-03-12 DIAGNOSIS — E039 Hypothyroidism, unspecified: Secondary | ICD-10-CM | POA: Diagnosis not present

## 2022-03-12 MED ORDER — LEVOTHYROXINE SODIUM 100 MCG PO TABS: 100.0000 ug | ORAL_TABLET | Freq: Every day | ORAL | 3 refills | Status: DC

## 2022-03-12 NOTE — Progress Notes (Signed)
Subjective:  Patient ID: [name], female    DOB: 1941/04/27  Age: 81 y.o. MRN: [id number]  CC: Follow-up   HPI [name] presents for  follow-up on  thyroid. The patient has a history of hypothyroidism for many years. Energy level has been low. Patient denies constipation and diarrhea. No myxedema. Medication is as noted below. Verified that pt is taking it daily on an empty stomach. Well tolerated.  Having right sided pain at the costal margin near the midline. Intermittent. Had cholecystectomy last fall. Pain is at the gallbladder fossa area. Pt. declined      03/12/2022   11:25 AM 01/23/2022    9:06 AM 01/23/2022    8:56 AM  Depression screen PHQ 2/9  Decreased Interest 0 0 0  Down, Depressed, Hopeless 0 0 0  PHQ - 2 Score 0 0 0    History [name] has a past medical history of Adenocarcinoma of left breast (04/16/2012), Breast cancer ([place]), COPD (chronic obstructive pulmonary disease) ([place]), Gall bladder stones, Grave's disease, Headache, High triglycerides, History of kidney stones, Hyperthyroidism, Nerve compression, Pneumonia (1990), PONV (postoperative nausea and vomiting), and Sepsis due to Escherichia coli with acute renal failure without septic shock ([place]).   She has a past surgical history that includes Mastectomy; Carpal tunnel release; cataract surgery; Tonsillectomy; Eye surgery; Laparoscopic nephrectomy (Left, 03/25/2019); Colonoscopy; Node dissection (Left, 09/11/2019); Thoracotomy/lobectomy (Left, 09/11/2019); Nephrectomy (Left); and Cholecystectomy (N/A, 05/05/2021).   Her family history includes Cancer in her brother, father, and another family member.She reports that she quit smoking about 2 years ago. Her smoking use included cigarettes. She has a 60.00 pack-year smoking history. She has never used smokeless tobacco. She reports that she does not drink alcohol and does not use drugs.    ROS Review of Systems  Constitutional:  Positive for fatigue.   HENT: Negative.    Eyes:  Negative for visual disturbance.  Respiratory:  Negative for shortness of breath.   Cardiovascular:  Negative for chest pain.  Gastrointestinal:  Positive for abdominal pain.  Musculoskeletal:  Negative for arthralgias.    Objective:  BP 122/71   Pulse 91   Temp 97.8 F (36.6 C)   Ht 5' 5.5" (1.664 m)   Wt 139 lb 6.4 oz (63.2 kg)   SpO2 94%   BMI 22.84 kg/m   BP Readings from Last 3 Encounters:  03/12/22 122/71  01/23/22 112/62  01/02/22 (!) 152/82    Wt Readings from Last 3 Encounters:  03/12/22 139 lb 6.4 oz (63.2 kg)  01/23/22 146 lb (66.2 kg)  01/02/22 151 lb 8 oz (68.7 kg)     Physical Exam    Assessment & Plan:   [name] was seen today for follow-up.  Diagnoses and all orders for this visit:  Hypothyroidism, unspecified type  Other orders -     levothyroxine (SYNTHROID) 100 MCG tablet; Take 1 tablet (100 mcg total) by mouth daily before breakfast.       I have changed [name]'s levothyroxine. I am also having her maintain her Centrum Silver Ultra Womens, hydroxypropyl methylcellulose / hypromellose, meclizine, ferrous sulfate, Calcium-Vitamin D (CVS CALCIUM-600/VIT D PO), acetaminophen, and atorvastatin.  Allergies as of 03/12/2022       Reactions   Dicyclomine Hcl Itching   Chantix [varenicline]    depression        Medication List        Accurate as of March 12, 2022 11:58 AM. If you have any questions,  ask your nurse or doctor.          acetaminophen 500 MG tablet Commonly known as: TYLENOL Take 500 mg by mouth every 8 (eight) hours as needed for moderate pain.   atorvastatin 20 MG tablet Commonly known as: LIPITOR Take 1 tablet (20 mg total) by mouth daily. For cholesterol   Centrum Silver Ultra Womens Tabs Take 1 tablet by mouth daily.   CVS CALCIUM-600/VIT D PO Take 1 capsule by mouth in the morning and at bedtime.   ferrous sulfate 325 (65 FE) MG EC tablet Take 325 mg by mouth  daily.   hydroxypropyl methylcellulose / hypromellose 2.5 % ophthalmic solution Commonly known as: ISOPTO TEARS / GONIOVISC Place 1 drop into both eyes 3 (three) times daily as needed for dry eyes.   levothyroxine 100 MCG tablet Commonly known as: SYNTHROID Take 1 tablet (100 mcg total) by mouth daily before breakfast. What changed:  medication strength how much to take   meclizine 25 MG tablet Commonly known as: ANTIVERT Take 25 mg by mouth daily as needed for dizziness or nausea.         Follow-up: Return in about 2 months (around 05/13/2022) for Hypothyroidism.  Claretta Fraise, M.D.

## 2022-03-14 LAB — BASIC METABOLIC PANEL
BUN/Creatinine Ratio: 15 (ref 12–28)
BUN: 17 mg/dL (ref 8–27)
CO2: 18 mmol/L — ABNORMAL LOW (ref 20–29)
Calcium: 9.7 mg/dL (ref 8.7–10.3)
Chloride: 106 mmol/L (ref 96–106)
Creatinine, Ser: 1.14 mg/dL — ABNORMAL HIGH (ref 0.57–1.00)
Glucose: 104 mg/dL — ABNORMAL HIGH (ref 70–99)
Potassium: 5.2 mmol/L (ref 3.5–5.2)
Sodium: 144 mmol/L (ref 134–144)
eGFR: 49 mL/min/{1.73_m2} — ABNORMAL LOW (ref >59)

## 2022-03-14 LAB — SPECIMEN STATUS REPORT

## 2022-03-14 NOTE — Progress Notes (Signed)
Hello [name],  Your lab result is normal and/or stable.Some minor variations that are not significant are commonly marked abnormal, but do not represent any medical problem for you.  Best regards, Claretta Fraise, M.D.

## 2022-03-27 ENCOUNTER — Other Ambulatory Visit: Payer: Self-pay | Admitting: Family Medicine

## 2022-03-28 ENCOUNTER — Other Ambulatory Visit (HOSPITAL_COMMUNITY): Payer: Self-pay | Admitting: Family Medicine

## 2022-03-28 DIAGNOSIS — Z1231 Encounter for screening mammogram for malignant neoplasm of breast: Secondary | ICD-10-CM

## 2022-04-03 ENCOUNTER — Ambulatory Visit (INDEPENDENT_AMBULATORY_CARE_PROVIDER_SITE_OTHER): Payer: Medicare PPO | Admitting: Gastroenterology

## 2022-04-26 ENCOUNTER — Inpatient Hospital Stay: Payer: Medicare PPO | Attending: Hematology

## 2022-04-26 ENCOUNTER — Ambulatory Visit (HOSPITAL_COMMUNITY)
Admission: RE | Admit: 2022-04-26 | Discharge: 2022-04-26 | Disposition: A | Discharge status: Home / Self Care | Payer: Medicare PPO | Source: Ambulatory Visit | Attending: Hematology | Admitting: Hematology

## 2022-04-26 DIAGNOSIS — C3412 Malignant neoplasm of upper lobe, left bronchus or lung: Secondary | ICD-10-CM | POA: Insufficient documentation

## 2022-04-26 DIAGNOSIS — I700 Atherosclerosis of aorta: Secondary | ICD-10-CM | POA: Insufficient documentation

## 2022-04-26 DIAGNOSIS — Z87891 Personal history of nicotine dependence: Secondary | ICD-10-CM | POA: Diagnosis not present

## 2022-04-26 DIAGNOSIS — C3492 Malignant neoplasm of unspecified part of left bronchus or lung: Secondary | ICD-10-CM

## 2022-04-26 DIAGNOSIS — Z9049 Acquired absence of other specified parts of digestive tract: Secondary | ICD-10-CM | POA: Insufficient documentation

## 2022-04-26 DIAGNOSIS — Z9012 Acquired absence of left breast and nipple: Secondary | ICD-10-CM | POA: Insufficient documentation

## 2022-04-26 DIAGNOSIS — J439 Emphysema, unspecified: Secondary | ICD-10-CM | POA: Diagnosis not present

## 2022-04-26 DIAGNOSIS — R197 Diarrhea, unspecified: Secondary | ICD-10-CM | POA: Diagnosis not present

## 2022-04-26 DIAGNOSIS — D509 Iron deficiency anemia, unspecified: Secondary | ICD-10-CM | POA: Insufficient documentation

## 2022-04-26 DIAGNOSIS — C642 Malignant neoplasm of left kidney, except renal pelvis: Secondary | ICD-10-CM | POA: Insufficient documentation

## 2022-04-26 DIAGNOSIS — R42 Dizziness and giddiness: Secondary | ICD-10-CM | POA: Insufficient documentation

## 2022-04-26 DIAGNOSIS — Z905 Acquired absence of kidney: Secondary | ICD-10-CM | POA: Insufficient documentation

## 2022-04-26 DIAGNOSIS — N200 Calculus of kidney: Secondary | ICD-10-CM | POA: Diagnosis not present

## 2022-04-26 DIAGNOSIS — R0602 Shortness of breath: Secondary | ICD-10-CM | POA: Diagnosis not present

## 2022-04-26 DIAGNOSIS — Z87442 Personal history of urinary calculi: Secondary | ICD-10-CM | POA: Diagnosis not present

## 2022-04-26 DIAGNOSIS — Z800 Family history of malignant neoplasm of digestive organs: Secondary | ICD-10-CM | POA: Insufficient documentation

## 2022-04-26 DIAGNOSIS — Z85528 Personal history of other malignant neoplasm of kidney: Secondary | ICD-10-CM | POA: Insufficient documentation

## 2022-04-26 DIAGNOSIS — Z801 Family history of malignant neoplasm of trachea, bronchus and lung: Secondary | ICD-10-CM | POA: Insufficient documentation

## 2022-04-26 DIAGNOSIS — Z79899 Other long term (current) drug therapy: Secondary | ICD-10-CM | POA: Diagnosis not present

## 2022-04-26 DIAGNOSIS — Z853 Personal history of malignant neoplasm of breast: Secondary | ICD-10-CM | POA: Insufficient documentation

## 2022-04-26 LAB — COMPREHENSIVE METABOLIC PANEL
ALT: 18 U/L (ref 0–44)
AST: 22 U/L (ref 15–41)
Albumin: 4.5 g/dL (ref 3.5–5.0)
Alkaline Phosphatase: 78 U/L (ref 38–126)
Anion gap: 10 (ref 5–15)
BUN: 13 mg/dL (ref 8–23)
CO2: 26 mmol/L (ref 22–32)
Calcium: 9.4 mg/dL (ref 8.9–10.3)
Chloride: 107 mmol/L (ref 98–111)
Creatinine, Ser: 1.04 mg/dL — ABNORMAL HIGH (ref 0.44–1.00)
GFR, Estimated: 55 mL/min — ABNORMAL LOW (ref >60)
Glucose, Bld: 94 mg/dL (ref 70–99)
Potassium: 4.5 mmol/L (ref 3.5–5.1)
Sodium: 143 mmol/L (ref 135–145)
Total Bilirubin: 0.9 mg/dL (ref 0.3–1.2)
Total Protein: 7.7 g/dL (ref 6.5–8.1)

## 2022-04-26 LAB — CBC WITH DIFFERENTIAL/PLATELET
Abs Immature Granulocytes: 0.03 10*3/uL (ref 0.00–0.07)
Basophils Absolute: 0.1 10*3/uL (ref 0.0–0.1)
Basophils Relative: 1 %
Eosinophils Absolute: 0.2 10*3/uL (ref 0.0–0.5)
Eosinophils Relative: 4 %
HCT: 39.4 % (ref 36.0–46.0)
Hemoglobin: 12.4 g/dL (ref 12.0–15.0)
Immature Granulocytes: 1 %
Lymphocytes Relative: 20 %
Lymphs Abs: 1.1 10*3/uL (ref 0.7–4.0)
MCH: 30.2 pg (ref 26.0–34.0)
MCHC: 31.5 g/dL (ref 30.0–36.0)
MCV: 95.9 fL (ref 80.0–100.0)
Monocytes Absolute: 0.3 10*3/uL (ref 0.1–1.0)
Monocytes Relative: 6 %
Neutro Abs: 3.5 10*3/uL (ref 1.7–7.7)
Neutrophils Relative %: 68 %
Platelets: 218 10*3/uL (ref 150–400)
RBC: 4.11 MIL/uL (ref 3.87–5.11)
RDW: 13.3 % (ref 11.5–15.5)
WBC: 5.2 10*3/uL (ref 4.0–10.5)
nRBC: 0 % (ref 0.0–0.2)

## 2022-04-26 LAB — FERRITIN: Ferritin: 31 ng/mL (ref 11–307)

## 2022-04-26 LAB — IRON AND TIBC
Iron: 79 ug/dL (ref 28–170)
Saturation Ratios: 23 % (ref 10.4–31.8)
TIBC: 348 ug/dL (ref 250–450)
UIBC: 269 ug/dL

## 2022-04-30 ENCOUNTER — Ambulatory Visit (HOSPITAL_COMMUNITY): Payer: Medicare PPO

## 2022-05-03 ENCOUNTER — Inpatient Hospital Stay: Payer: Medicare PPO | Admitting: Hematology

## 2022-05-03 DIAGNOSIS — R0602 Shortness of breath: Secondary | ICD-10-CM | POA: Diagnosis not present

## 2022-05-03 DIAGNOSIS — D509 Iron deficiency anemia, unspecified: Secondary | ICD-10-CM

## 2022-05-03 DIAGNOSIS — C642 Malignant neoplasm of left kidney, except renal pelvis: Secondary | ICD-10-CM | POA: Diagnosis not present

## 2022-05-03 DIAGNOSIS — C3492 Malignant neoplasm of unspecified part of left bronchus or lung: Secondary | ICD-10-CM | POA: Diagnosis not present

## 2022-05-03 DIAGNOSIS — I700 Atherosclerosis of aorta: Secondary | ICD-10-CM | POA: Diagnosis not present

## 2022-05-03 DIAGNOSIS — Z79899 Other long term (current) drug therapy: Secondary | ICD-10-CM | POA: Diagnosis not present

## 2022-05-03 DIAGNOSIS — C3412 Malignant neoplasm of upper lobe, left bronchus or lung: Secondary | ICD-10-CM | POA: Diagnosis not present

## 2022-05-03 DIAGNOSIS — R197 Diarrhea, unspecified: Secondary | ICD-10-CM | POA: Diagnosis not present

## 2022-05-03 DIAGNOSIS — Z87891 Personal history of nicotine dependence: Secondary | ICD-10-CM | POA: Diagnosis not present

## 2022-05-03 DIAGNOSIS — R42 Dizziness and giddiness: Secondary | ICD-10-CM | POA: Diagnosis not present

## 2022-05-03 NOTE — Patient Instructions (Signed)
[place] at [facility] Discharge Instructions   You were seen and examined today by Dr. Delton Coombes.  He reviewed the results of your CT scan which is good - no evidence of cancer seen.   Take your iron tablet with vitamin C (orange juice) to improve absorption of your iron.   We will see you back in 6 months. We will repeat your blood work and CT scan prior to your next visit.    Thank you for choosing [place] at [facility] to provide your oncology and hematology care.  To afford each patient quality time with our provider, please arrive at least 15 minutes before your scheduled appointment time.   If you have a lab appointment with the [facility] please come in thru the Main Entrance and check in at the main information desk.  You need to re-schedule your appointment should you arrive 10 or more minutes late.  We strive to give you quality time with our providers, and arriving late affects you and other patients whose appointments are after yours.  Also, if you no show three or more times for appointments you may be dismissed from the clinic at the providers discretion.     Again, thank you for choosing [facility].  Our hope is that these requests will decrease the amount of time that you wait before being seen by our physicians.       _____________________________________________________________  Should you have questions after your visit to [facility], please contact our office at [phone] and follow the prompts.  Our office hours are 8:00 a.m. and 4:30 p.m. Monday - Friday.  Please note that voicemails left after 4:00 p.m. may not be returned until the following business day.  We are closed weekends and major holidays.  You do have access to a nurse 24-7, just call the main number to the clinic [phone] and do not press any options, hold on the line and a nurse will answer the phone.    For  prescription refill requests, have your pharmacy contact our office and allow 72 hours.    Due to Covid, you will need to wear a mask upon entering the hospital. If you do not have a mask, a mask will be given to you at the Main Entrance upon arrival. For doctor visits, patients may have 1 support person age 73 or older with them. For treatment visits, patients can not have anyone with them due to social distancing guidelines and our immunocompromised population.

## 2022-05-03 NOTE — Progress Notes (Signed)
[place], [place] [address]   CLINIC:  Medical Oncology/Hematology  PCP:  Shelby Fraise, Tyler [place] / [place] [address] [phone]   REASON FOR VISIT:  Follow-up for squamous cell carcinoma of left lung and IDA  PRIOR THERAPY:  1. Left upper lobe lobectomy on 09/11/2019. 2. Intermittent Feraheme last on 04/01/2020.  NGS Results: not done  CURRENT THERAPY: Iron tablets daily  BRIEF ONCOLOGIC HISTORY:  Oncology History  Clear cell renal cell carcinoma, left ([facility])  04/13/2019 Initial Diagnosis   Clear cell renal cell carcinoma, left ([facility])   04/13/2019 Tyler Staging   Staging form: Kidney, AJCC 8th Edition - Clinical: Stage III (cT3a, cNX, cM0) - Signed by [name] on 04/13/2019   Lung Tyler ([facility])  09/11/2019 Initial Diagnosis   Lung Tyler ([facility])   10/26/2019 Tyler Staging   Staging form: Lung, AJCC 8th Edition - Clinical stage from 10/26/2019: Stage IIB (cT1b, cN1, cM0) - Signed by [name] on 10/26/2019     Tyler STAGING:  Tyler Staging  Adenocarcinoma of left breast Staging form: Breast, AJCC 7th Edition - Clinical: Stage IIA (T2, N0, cM0) - Signed by [name] on 04/16/2012  Clear cell renal cell carcinoma, left ([facility]) Staging form: Kidney, AJCC 8th Edition - Clinical: Stage III (cT3a, cNX, cM0) - Signed by [name] on 04/13/2019  Lung Tyler [facility]) Staging form: Lung, AJCC 8th Edition - Clinical stage from 10/26/2019: Stage IIB (cT1b, cN1, cM0) - Signed by [name] on 10/26/2019   INTERVAL HISTORY:  [name], a 81 y.o. female, seen for follow-up of iron deficiency anemia and lung Tyler.  Does not report any new onset pains.  Taking iron tablet daily.  Energy is reported as stable.  REVIEW OF SYSTEMS:  Review of Systems  Constitutional:  Negative for appetite change and fatigue.  Respiratory:  Positive for shortness of breath.    Gastrointestinal:  Positive for diarrhea. Negative for abdominal pain and constipation.  Genitourinary:  Positive for frequency.   Neurological:  Positive for dizziness.  All other systems reviewed and are negative.   PAST MEDICAL/SURGICAL HISTORY:  Past Medical History:  Diagnosis Date   Adenocarcinoma of left breast 04/16/2012   Stage II (T2 N0 M0) left-sided adenocarcinoma breast diagnosed in 1990 by Dr. Romona Tyler and treated by Dr. Juanita Tyler on NSAB PPD-19 with CMF followed by 5 years of tamoxifen and she remains disease free thus far.    Breast Tyler ([facility])    left breast/mastectomy/dx approx1990   COPD (chronic obstructive pulmonary disease) (HCC)    Gall bladder stones    Grave's disease    Headache    Migraines   High triglycerides    History of kidney stones    small stone  rt    Hyperthyroidism    Nerve compression    pinched in neck   Pneumonia 1990   PONV (postoperative nausea and vomiting)    Sepsis due to Escherichia coli with acute renal failure without septic shock ([facility])    Past Surgical History:  Procedure Laterality Date   CARPAL TUNNEL RELEASE     rt. hand   cataract surgery     bilateral   CHOLECYSTECTOMY N/A 05/05/2021   Procedure: LAPAROSCOPIC CHOLECYSTECTOMY;  Surgeon: Shelby Riley, Tyler;  Location: [facility];  Service: General;  Laterality: N/A;   COLONOSCOPY     EYE SURGERY  LAPAROSCOPIC NEPHRECTOMY Left 03/25/2019   Procedure: LAPAROSCOPIC RADICAL NEPHRECTOMY;  Surgeon: Shelby Mons, Tyler;  Location: [facility];  Service: Urology;  Laterality: Left;   MASTECTOMY     left.   NEPHRECTOMY Left    NODE DISSECTION Left 09/11/2019   Procedure: Node Dissection;  Surgeon: [name];  Location: [facility];  Service: Thoracic;  Laterality: Left;   THORACOTOMY/LOBECTOMY Left 09/11/2019   Procedure: Thoracotomy/Lobectomy Left Upper Lobe;  Surgeon: [name];  Location: [facility];  Service: Thoracic;  Laterality: Left;    TONSILLECTOMY      SOCIAL HISTORY:  Social History   Socioeconomic History   Marital status: Divorced    Spouse name: Not on file   Number of children: 2   Years of education: Not on file   Highest education level: Not on file  Occupational History   Occupation: retired  Tobacco Use   Smoking status: Former    Packs/day: 1.00    Years: 60.00    Total pack years: 60.00    Types: Cigarettes    Quit date: 09/22/2019    Years since quitting: 2.6   Smokeless tobacco: Never   Tobacco comments:    attempted to stop but its expensive to quit  Vaping Use   Vaping Use: Never used  Substance and Sexual Activity   Alcohol use: No   Drug use: No   Sexual activity: Not on file  Other Topics Concern   Not on file  Social History Narrative   Lives alone - has a basement but doesn't use it   Daughter [name] checks on her daily   Daughter [name] also helps a lot   Social Determinants of Health   Financial Resource Strain: Low Risk  (09/29/2021)   Overall Financial Resource Strain (CARDIA)    Difficulty of Paying Living Expenses: Not very hard  Food Insecurity: No Food Insecurity (09/29/2021)   Hunger Vital Sign    Worried About Running Out of Food in the Last Year: Never true    [place] in the Last Year: Never true  Transportation Needs: No Transportation Needs (09/29/2021)   PRAPARE - Hydrologist (Medical): No    Lack of Transportation (Non-Medical): No  Physical Activity: Insufficiently Active (09/29/2021)   Exercise Vital Sign    Days of Exercise per Week: 7 days    Minutes of Exercise per Session: 10 min  Stress: No Stress Concern Present (09/29/2021)   [place]    Feeling of Stress : Not at all  Social Connections: Socially Isolated (09/29/2021)   Social Connection and Isolation Panel [NHANES]    Frequency of Communication with Friends and Family: More than three  times a week    Frequency of Social Gatherings with Friends and Family: More than three times a week    Attends Religious Services: Never    Marine scientist or Organizations: No    Attends Archivist Meetings: Never    Marital Status: Widowed  Intimate Partner Violence: Not At Risk (09/29/2021)   Humiliation, Afraid, Rape, and Kick questionnaire    Fear of Current or Ex-Partner: No    Emotionally Abused: No    Physically Abused: No    Sexually Abused: No    FAMILY HISTORY:  Family History  Problem Relation Age of Onset   Tyler Father        lung  Tyler   Tyler Brother        colon Tyler   Tyler Other        lung Tyler/died age 83    CURRENT MEDICATIONS:  Current Outpatient Medications  Medication Sig Dispense Refill   acetaminophen (TYLENOL) 500 MG tablet Take 500 mg by mouth every 8 (eight) hours as needed for moderate pain.     atorvastatin (LIPITOR) 20 MG tablet TAKE 1 TABLET EVERY DAY FOR CHOLESTEROL 90 tablet 1   Calcium-Vitamin D (CVS CALCIUM-600/VIT D PO) Take 1 capsule by mouth in the morning and at bedtime.     ferrous sulfate 325 (65 FE) MG EC tablet Take 325 mg by mouth daily.     hydroxypropyl methylcellulose / hypromellose (ISOPTO TEARS / GONIOVISC) 2.5 % ophthalmic solution Place 1 drop into both eyes 3 (three) times daily as needed for dry eyes.      levothyroxine (SYNTHROID) 100 MCG tablet Take 1 tablet (100 mcg total) by mouth daily before breakfast. 90 tablet 3   meclizine (ANTIVERT) 25 MG tablet Take 25 mg by mouth daily as needed for dizziness or nausea.     Multiple Vitamins-Minerals (CENTRUM SILVER ULTRA WOMENS) TABS Take 1 tablet by mouth daily.     No current facility-administered medications for this visit.    ALLERGIES:  Allergies  Allergen Reactions   Dicyclomine Hcl Itching   Chantix [Varenicline]     depression    PHYSICAL EXAM:  Performance status (ECOG): 1 - Symptomatic but completely ambulatory  There were no  vitals filed for this visit. Wt Readings from Last 3 Encounters:  03/12/22 139 lb 6.4 oz (63.2 kg)  01/23/22 146 lb (66.2 kg)  01/02/22 151 lb 8 oz (68.7 kg)   Physical Exam Vitals reviewed.  Constitutional:      Appearance: Normal appearance.  Cardiovascular:     Rate and Rhythm: Normal rate and regular rhythm.     Pulses: Normal pulses.     Heart sounds: Normal heart sounds.  Pulmonary:     Effort: Pulmonary effort is normal.     Breath sounds: Normal breath sounds.  Abdominal:     Palpations: There is no hepatomegaly, splenomegaly or mass.     Tenderness: There is abdominal tenderness.  Neurological:     General: No focal deficit present.     Mental Status: She is alert and oriented to person, place, and time.  Psychiatric:        Mood and Affect: Mood normal.        Behavior: Behavior normal.     LABORATORY DATA:  I have reviewed the labs as listed.     Latest Ref Rng & Units 04/26/2022    9:03 AM 01/23/2022    9:32 AM 11/14/2021    4:54 PM  CBC  WBC 4.0 - 10.5 K/uL 5.2  5.6  6.1   Hemoglobin 12.0 - 15.0 g/dL 12.4  13.2  13.6   Hematocrit 36.0 - 46.0 % 39.4  40.0  41.3   Platelets 150 - 400 K/uL 218  283  298       Latest Ref Rng & Units 04/26/2022    9:03 AM 03/09/2022    8:51 AM 01/23/2022    9:32 AM  CMP  Glucose 70 - 99 mg/dL 94  104  100   BUN 8 - 23 mg/dL 13  17  17    Creatinine 0.44 - 1.00 mg/dL 1.04  1.14  1.25   Sodium 135 - 145  mmol/L 143  144  143   Potassium 3.5 - 5.1 mmol/L 4.5  5.2  4.7   Chloride 98 - 111 mmol/L 107  106  103   CO2 22 - 32 mmol/L 26  18  23    Calcium 8.9 - 10.3 mg/dL 9.4  9.7  9.8   Total Protein 6.5 - 8.1 g/dL 7.7   7.8   Total Bilirubin 0.3 - 1.2 mg/dL 0.9   0.4   Alkaline Phos 38 - 126 U/L 78   135   AST 15 - 41 U/L 22   15   ALT 0 - 44 U/L 18   12     DIAGNOSTIC IMAGING:  I have independently reviewed the scans and discussed with the patient. CT Chest Wo Contrast  Result Date: 04/27/2022 CLINICAL DATA:  History of  non-small cell lung Tyler and renal Tyler, surveillance. * Tracking Code: BO * EXAM: CT CHEST AND ABDOMEN WITHOUT CONTRAST TECHNIQUE: Multidetector CT imaging of the chest and abdomen was performed following the standard protocol without intravenous contrast. RADIATION DOSE REDUCTION: This exam was performed according to the departmental dose-optimization program which includes automated exposure control, adjustment of the mA and/or kV according to patient size and/or use of iterative reconstruction technique. COMPARISON:  Multiple priors including most recent chest CT dated October 23, 2021 and CT chest abdomen pelvis dated April 03, 2021 FINDINGS: CT CHEST FINDINGS WITHOUT CONTRAST Cardiovascular: Aortic athero sclerosus. Coronary artery calcifications. Normal size heart. Significant pericardial effusion/thickening. Mediastinum/Nodes: No suspicious thyroid nodule. No pathologically enlarged mediastinal, hilar or axillary lymph nodes, noting limited sensitivity for the detection of hilar adenopathy on this noncontrast study. Prior left axillary lymph node dissection. Lungs/Pleura: Similar changes left upper lobectomy. Biapical pleuroparenchymal scarring. Centrilobular and paraseptal emphysema. Scattered pulmonary parenchymal scarring. No pleural effusion. No pneumothorax. Musculoskeletal: Prior left mastectomy. No suspicious bone lesions identified. CT ABDOMEN FINDINGS WITHOUT CONTRAST Hepatobiliary: No suspicious hepatic lesion on noncontrast enhanced examination. Gallbladder surgically absent. No biliary ductal dilation. Pancreas: No pancreatic ductal dilation or evidence of acute inflammation. Spleen: Calcified splenic granulomata. Adrenals/Urinary Tract: Similar left adrenal thickening. Right adrenal gland appears normal. Surgical changes of prior left nephrectomy without new suspicious nodularity in the nephrectomy bed. Nonobstructive 3 mm right lower pole renal stone. No suspicious contour deforming renal  mass. Stomach/Bowel: Radiopaque enteric contrast material traverses the descending colon. Stomach is unremarkable for degree of distension. No pathologic dilation or evidence of acute inflammation involving loops of large or small bowel in the abdomen. Vascular/Lymphatic: Aortic atherosclerosis. No pathologically enlarged abdominal lymph nodes. Other: Small fat and fluid containing ventral hernia appears similar prior. No abdominal free fluid. Musculoskeletal: No aggressive lytic or blastic lesion of bone. IMPRESSION: 1. Prior left upper lobectomy, radical left nephrectomy and left mastectomy without noncontrast enhanced CT findings to suggest local recurrence. 2. No evidence of metastatic disease in the chest or abdomen on this noncontrast enhanced CT. 3. Aortic Atherosclerosis (ICD10-I70.0) and Emphysema (ICD10-J43.9). Electronically Signed   By: Dahlia Bailiff M.D.   On: 04/27/2022 12:49   CT Abdomen Wo Contrast  Result Date: 04/27/2022 CLINICAL DATA:  History of non-small cell lung Tyler and renal Tyler, surveillance. * Tracking Code: BO * EXAM: CT CHEST AND ABDOMEN WITHOUT CONTRAST TECHNIQUE: Multidetector CT imaging of the chest and abdomen was performed following the standard protocol without intravenous contrast. RADIATION DOSE REDUCTION: This exam was performed according to the departmental dose-optimization program which includes automated exposure control, adjustment of the mA and/or kV according to  patient size and/or use of iterative reconstruction technique. COMPARISON:  Multiple priors including most recent chest CT dated October 23, 2021 and CT chest abdomen pelvis dated April 03, 2021 FINDINGS: CT CHEST FINDINGS WITHOUT CONTRAST Cardiovascular: Aortic athero sclerosus. Coronary artery calcifications. Normal size heart. Significant pericardial effusion/thickening. Mediastinum/Nodes: No suspicious thyroid nodule. No pathologically enlarged mediastinal, hilar or axillary lymph nodes, noting  limited sensitivity for the detection of hilar adenopathy on this noncontrast study. Prior left axillary lymph node dissection. Lungs/Pleura: Similar changes left upper lobectomy. Biapical pleuroparenchymal scarring. Centrilobular and paraseptal emphysema. Scattered pulmonary parenchymal scarring. No pleural effusion. No pneumothorax. Musculoskeletal: Prior left mastectomy. No suspicious bone lesions identified. CT ABDOMEN FINDINGS WITHOUT CONTRAST Hepatobiliary: No suspicious hepatic lesion on noncontrast enhanced examination. Gallbladder surgically absent. No biliary ductal dilation. Pancreas: No pancreatic ductal dilation or evidence of acute inflammation. Spleen: Calcified splenic granulomata. Adrenals/Urinary Tract: Similar left adrenal thickening. Right adrenal gland appears normal. Surgical changes of prior left nephrectomy without new suspicious nodularity in the nephrectomy bed. Nonobstructive 3 mm right lower pole renal stone. No suspicious contour deforming renal mass. Stomach/Bowel: Radiopaque enteric contrast material traverses the descending colon. Stomach is unremarkable for degree of distension. No pathologic dilation or evidence of acute inflammation involving loops of large or small bowel in the abdomen. Vascular/Lymphatic: Aortic atherosclerosis. No pathologically enlarged abdominal lymph nodes. Other: Small fat and fluid containing ventral hernia appears similar prior. No abdominal free fluid. Musculoskeletal: No aggressive lytic or blastic lesion of bone. IMPRESSION: 1. Prior left upper lobectomy, radical left nephrectomy and left mastectomy without noncontrast enhanced CT findings to suggest local recurrence. 2. No evidence of metastatic disease in the chest or abdomen on this noncontrast enhanced CT. 3. Aortic Atherosclerosis (ICD10-I70.0) and Emphysema (ICD10-J43.9). Electronically Signed   By: Dahlia Bailiff M.D.   On: 04/27/2022 12:49     ASSESSMENT:  1.  Stage IIb (T1BN1) squamous cell  carcinoma of the left upper lobe of the lung: -Left upper lobectomy on 09/11/2019. -Brain MRI on 12/07/2019 with no metastatic disease. -Adjuvant chemotherapy was not offered based on poor performance status and weight loss. -CT chest on 01/08/2020 did not show any evidence of local recurrence left lung.  Trace loculated medial left pleural effusion.  No evidence of metastatic disease in the chest.  Tiny medial right upper lobe 3 mm solid pulmonary nodule decreased in the interval, presumably benign. -CT chest with contrast on 06/21/2020 with no evidence of recurrence or metastatic disease.  3 mm nodule in the medial right chest is unchanged. -CT chest with contrast on 09/09/2020 showed stable postoperative changes from left upper lobectomy with no evidence of recurrence or metastasis.   2.  Normocytic anemia: -Feraheme on 03/25/2020 and 04/01/2020.   3.  Left kidney clear cell renal cell carcinoma: -Left radical nephrectomy on 03/25/2019.  Pathology shows RCC, conventional clear cell type, nuclear grade 2 (5.8 cm).  Tumor invades anterior perinephric fat and segmental renal vein (P T3a), ureteral, vascular normal resection margins are negative.   PLAN:  1.  Stage IIb (T1BN1) squamous cell carcinoma of the left upper lobe of the lung: - CT chest on 04/26/2022 showed prior left upper lobectomy with no recurrence. - CT abdomen done for history of kidney Tyler also did not show any evidence of recurrence. - Reviewed labs today which showed normal LFTs.  Mildly elevated creatinine is stable.  CBC was grossly normal. - Recommend RTC in 6 months with repeat CT scan of the chest.   2.  Normocytic anemia: - She is taking iron tablet daily.  Hemoglobin today is 12.4 and ferritin dropped to 31 from 108 in May. - Recommend adding vitamin C along with iron tablet.  We will check iron panel again in 6 months.  If there is any deterioration, consider parenteral iron therapy.   Orders placed this encounter:  No  orders of the defined types were placed in this encounter.    [name] [place] [phone]

## 2022-05-15 ENCOUNTER — Ambulatory Visit: Payer: Medicare PPO | Admitting: Family Medicine

## 2022-05-21 ENCOUNTER — Ambulatory Visit (HOSPITAL_COMMUNITY)
Admission: RE | Admit: 2022-05-21 | Discharge: 2022-05-21 | Disposition: A | Discharge status: Home / Self Care | Payer: Medicare PPO | Source: Ambulatory Visit | Attending: Family Medicine | Admitting: Family Medicine

## 2022-05-21 ENCOUNTER — Other Ambulatory Visit: Payer: Medicare PPO

## 2022-05-21 ENCOUNTER — Other Ambulatory Visit: Payer: Self-pay | Admitting: *Deleted

## 2022-05-21 DIAGNOSIS — E039 Hypothyroidism, unspecified: Secondary | ICD-10-CM

## 2022-05-21 DIAGNOSIS — E782 Mixed hyperlipidemia: Secondary | ICD-10-CM

## 2022-05-21 DIAGNOSIS — Z1231 Encounter for screening mammogram for malignant neoplasm of breast: Secondary | ICD-10-CM | POA: Insufficient documentation

## 2022-05-21 DIAGNOSIS — D508 Other iron deficiency anemias: Secondary | ICD-10-CM | POA: Diagnosis not present

## 2022-05-21 DIAGNOSIS — C3490 Malignant neoplasm of unspecified part of unspecified bronchus or lung: Secondary | ICD-10-CM | POA: Diagnosis not present

## 2022-05-22 LAB — TSH+FREE T4
Free T4: 1.42 ng/dL (ref 0.82–1.77)
TSH: 0.417 u[IU]/mL — ABNORMAL LOW (ref 0.450–4.500)

## 2022-05-22 LAB — CMP14+EGFR
ALT: 11 IU/L (ref 0–32)
AST: 20 IU/L (ref 0–40)
Albumin/Globulin Ratio: 2.1 (ref 1.2–2.2)
Albumin: 4.8 g/dL (ref 3.8–4.8)
Alkaline Phosphatase: 93 IU/L (ref 44–121)
BUN/Creatinine Ratio: 16 (ref 12–28)
BUN: 20 mg/dL (ref 8–27)
Bilirubin Total: 0.4 mg/dL (ref 0.0–1.2)
CO2: 23 mmol/L (ref 20–29)
Calcium: 9.7 mg/dL (ref 8.7–10.3)
Chloride: 104 mmol/L (ref 96–106)
Creatinine, Ser: 1.24 mg/dL — ABNORMAL HIGH (ref 0.57–1.00)
Globulin, Total: 2.3 g/dL (ref 1.5–4.5)
Glucose: 86 mg/dL (ref 70–99)
Potassium: 5.1 mmol/L (ref 3.5–5.2)
Sodium: 142 mmol/L (ref 134–144)
Total Protein: 7.1 g/dL (ref 6.0–8.5)
eGFR: 44 mL/min/{1.73_m2} — ABNORMAL LOW (ref >59)

## 2022-05-22 LAB — LIPID PANEL
Chol/HDL Ratio: 3.2 ratio (ref 0.0–4.4)
Cholesterol, Total: 162 mg/dL (ref 100–199)
HDL: 51 mg/dL (ref >39)
LDL Chol Calc (NIH): 74 mg/dL (ref 0–99)
Triglycerides: 228 mg/dL — ABNORMAL HIGH (ref 0–149)
VLDL Cholesterol Cal: 37 mg/dL (ref 5–40)

## 2022-05-22 LAB — CBC WITH DIFFERENTIAL/PLATELET
Basophils Absolute: 0.1 10*3/uL (ref 0.0–0.2)
Basos: 1 %
EOS (ABSOLUTE): 0.3 10*3/uL (ref 0.0–0.4)
Eos: 4 %
Hematocrit: 40 % (ref 34.0–46.6)
Hemoglobin: 13 g/dL (ref 11.1–15.9)
Immature Grans (Abs): 0 10*3/uL (ref 0.0–0.1)
Immature Granulocytes: 0 %
Lymphocytes Absolute: 1.4 10*3/uL (ref 0.7–3.1)
Lymphs: 22 %
MCH: 29.9 pg (ref 26.6–33.0)
MCHC: 32.5 g/dL (ref 31.5–35.7)
MCV: 92 fL (ref 79–97)
Monocytes Absolute: 0.4 10*3/uL (ref 0.1–0.9)
Monocytes: 7 %
Neutrophils Absolute: 4.1 10*3/uL (ref 1.4–7.0)
Neutrophils: 66 %
Platelets: 243 10*3/uL (ref 150–450)
RBC: 4.35 x10E6/uL (ref 3.77–5.28)
RDW: 13.3 % (ref 11.7–15.4)
WBC: 6.2 10*3/uL (ref 3.4–10.8)

## 2022-05-22 LAB — IRON,TIBC AND FERRITIN PANEL
Ferritin: 54 ng/mL (ref 15–150)
Iron Saturation: 29 % (ref 15–55)
Iron: 87 ug/dL (ref 27–139)
Total Iron Binding Capacity: 295 ug/dL (ref 250–450)
UIBC: 208 ug/dL (ref 118–369)

## 2022-05-28 ENCOUNTER — Encounter: Payer: Self-pay | Admitting: Family Medicine

## 2022-05-28 ENCOUNTER — Ambulatory Visit (INDEPENDENT_AMBULATORY_CARE_PROVIDER_SITE_OTHER): Payer: Medicare PPO | Admitting: Family Medicine

## 2022-05-28 VITALS — BP 122/71 | HR 80 | Temp 97.6°F | Ht 65.5 in | Wt 142.0 lb

## 2022-05-28 DIAGNOSIS — K591 Functional diarrhea: Secondary | ICD-10-CM | POA: Diagnosis not present

## 2022-05-28 DIAGNOSIS — E039 Hypothyroidism, unspecified: Secondary | ICD-10-CM | POA: Diagnosis not present

## 2022-05-28 DIAGNOSIS — Z23 Encounter for immunization: Secondary | ICD-10-CM

## 2022-05-28 MED ORDER — COLESTIPOL HCL 5 G PO GRAN: 5.0000 g | GRANULES | Freq: Two times a day (BID) | ORAL | 3 refills | Status: DC

## 2022-05-28 NOTE — Progress Notes (Signed)
Subjective:  Patient ID: [name], female    DOB: [birth year]  Age: 81 y.o. MRN: [id number]  CC: Hypothyroidism   HPI [name] presents for  follow-up on  thyroid. The patient has a history of hypothyroidism for many years. It has been adjusted recently. Pt. denies any change in  voice, loss of hair, heat or cold intolerance. Energy level has been adequate to good. Patient denies constipation and diarrhea. No myxedema. Medication is as noted below. Verified that pt is taking it daily on an empty stomach. Well tolerated.  Continues having multiple episodes of diarrhea daily.       05/28/2022    8:10 AM 03/12/2022   11:25 AM 01/23/2022    9:06 AM  Depression screen PHQ 2/9  Decreased Interest 0 0 0  Down, Depressed, Hopeless 0 0 0  PHQ - 2 Score 0 0 0    History [name] has a past medical history of Adenocarcinoma of left breast (04/16/2012), Breast cancer ([facility]), COPD (chronic obstructive pulmonary disease) ([facility]), Gall bladder stones, Grave's disease, Headache, High triglycerides, History of kidney stones, Hyperthyroidism, Nerve compression, Pneumonia (1990), PONV (postoperative nausea and vomiting), and Sepsis due to Escherichia coli with acute renal failure without septic shock ([facility]).   She has a past surgical history that includes Mastectomy; Carpal tunnel release; cataract surgery; Tonsillectomy; Eye surgery; Laparoscopic nephrectomy (Left, 03/25/2019); Colonoscopy; Node dissection (Left, 09/11/2019); Thoracotomy/lobectomy (Left, 09/11/2019); Nephrectomy (Left); and Cholecystectomy (N/A, 05/05/2021).   Her family history includes Cancer in her brother, father, and another family member.She reports that she quit smoking about 2 years ago. Her smoking use included cigarettes. She has a 60.00 pack-year smoking history. She has never used smokeless tobacco. She reports that she does not drink alcohol and does not use drugs.    ROS Review of Systems  Constitutional: Negative.    HENT: Negative.    Eyes:  Negative for visual disturbance.  Respiratory:  Negative for shortness of breath.   Cardiovascular:  Negative for chest pain.  Gastrointestinal:  Negative for abdominal pain.  Musculoskeletal:  Negative for arthralgias.    Objective:  BP 122/71   Pulse 80   Temp 97.6 F (36.4 C)   Ht 5' 5.5" (1.664 m)   Wt 142 lb (64.4 kg)   SpO2 95%   BMI 23.27 kg/m   BP Readings from Last 3 Encounters:  05/28/22 122/71  03/12/22 122/71  01/23/22 112/62    Wt Readings from Last 3 Encounters:  05/28/22 142 lb (64.4 kg)  03/12/22 139 lb 6.4 oz (63.2 kg)  01/23/22 146 lb (66.2 kg)     Physical Exam Constitutional:      General: She is not in acute distress.    Appearance: She is well-developed.  Cardiovascular:     Rate and Rhythm: Normal rate and regular rhythm.  Pulmonary:     Breath sounds: Normal breath sounds.  Musculoskeletal:        General: Normal range of motion.  Skin:    General: Skin is warm and dry.  Neurological:     Mental Status: She is alert and oriented to person, place, and time.       Assessment & Plan:   [name] was seen today for hypothyroidism.  Diagnoses and all orders for this visit:  Hypothyroidism, unspecified type  Need for immunization against influenza -     Flu Vaccine QUAD High Dose(Fluad)  Need for shingles vaccine -     Zoster Recombinant (Shingrix )  Functional diarrhea  Other orders -     colestipol (COLESTID) 5 g granules; Take 5 g by mouth 2 (two) times daily.       I am having [name] start on colestipol. I am also having her maintain her Centrum Silver Ultra Womens, hydroxypropyl methylcellulose / hypromellose, meclizine, ferrous sulfate, Calcium-Vitamin D (CVS CALCIUM-600/VIT D PO), acetaminophen, levothyroxine, and atorvastatin.  Allergies as of 05/28/2022       Reactions   Dicyclomine Hcl Itching   Chantix [varenicline]    depression        Medication List         Accurate as of May 28, 2022  7:55 PM. If you have any questions, ask your nurse or doctor.          acetaminophen 500 MG tablet Commonly known as: TYLENOL Take 500 mg by mouth every 8 (eight) hours as needed for moderate pain.   atorvastatin 20 MG tablet Commonly known as: LIPITOR TAKE 1 TABLET EVERY DAY FOR CHOLESTEROL   Centrum Silver Ultra Womens Tabs Take 1 tablet by mouth daily.   colestipol 5 g granules Commonly known as: COLESTID Take 5 g by mouth 2 (two) times daily. Started by: Claretta Fraise, MD   CVS CALCIUM-600/VIT D PO Take 1 capsule by mouth in the morning and at bedtime.   ferrous sulfate 325 (65 FE) MG EC tablet Take 325 mg by mouth daily.   hydroxypropyl methylcellulose / hypromellose 2.5 % ophthalmic solution Commonly known as: ISOPTO TEARS / GONIOVISC Place 1 drop into both eyes 3 (three) times daily as needed for dry eyes.   levothyroxine 100 MCG tablet Commonly known as: SYNTHROID Take 1 tablet (100 mcg total) by mouth daily before breakfast.   meclizine 25 MG tablet Commonly known as: ANTIVERT Take 25 mg by mouth daily as needed for dizziness or nausea.         Follow-up: Return in about 6 months (around 11/26/2022).  Claretta Fraise, M.D.

## 2022-10-04 ENCOUNTER — Ambulatory Visit (INDEPENDENT_AMBULATORY_CARE_PROVIDER_SITE_OTHER): Payer: Medicare PPO | Admitting: Family Medicine

## 2022-10-04 ENCOUNTER — Ambulatory Visit (INDEPENDENT_AMBULATORY_CARE_PROVIDER_SITE_OTHER): Payer: Medicare PPO

## 2022-10-04 ENCOUNTER — Encounter: Payer: Self-pay | Admitting: Family Medicine

## 2022-10-04 VITALS — BP 93/63 | HR 102 | Temp 97.5°F | Ht 65.5 in | Wt 140.5 lb

## 2022-10-04 DIAGNOSIS — M25532 Pain in left wrist: Secondary | ICD-10-CM

## 2022-10-04 DIAGNOSIS — S52352A Displaced comminuted fracture of shaft of radius, left arm, initial encounter for closed fracture: Secondary | ICD-10-CM | POA: Diagnosis not present

## 2022-10-04 DIAGNOSIS — M25562 Pain in left knee: Secondary | ICD-10-CM | POA: Diagnosis not present

## 2022-10-04 DIAGNOSIS — T07XXXA Unspecified multiple injuries, initial encounter: Secondary | ICD-10-CM | POA: Diagnosis not present

## 2022-10-04 DIAGNOSIS — W19XXXA Unspecified fall, initial encounter: Secondary | ICD-10-CM

## 2022-10-04 DIAGNOSIS — S52615A Nondisplaced fracture of left ulna styloid process, initial encounter for closed fracture: Secondary | ICD-10-CM | POA: Diagnosis not present

## 2022-10-04 DIAGNOSIS — S52502A Unspecified fracture of the lower end of left radius, initial encounter for closed fracture: Secondary | ICD-10-CM | POA: Diagnosis not present

## 2022-10-04 MED ORDER — TRAMADOL HCL 50 MG PO TABS: 50.0000 mg | ORAL_TABLET | Freq: Three times a day (TID) | ORAL | 0 refills | Status: AC | PRN

## 2022-10-04 NOTE — Patient Instructions (Signed)
[facility]  Orthopedic Urgent Care     Located in: [facility] Address: [address] [place], [place], [place] [address] Hours:  Open ? Closes 8?PM Phone: [phone]

## 2022-10-04 NOTE — Progress Notes (Signed)
Acute Office Visit  Subjective:     Patient ID: [name], female    DOB: 05/18/1941, 82 y.o.   MRN: [id number]  Chief Complaint  Patient presents with   Fall    HPI Her with daughter today. Patient is in today for wrist and knee pain after a fall. She fell yesterday while walking back from her mailbox. She isn't sure what causer her to fall or what she hit as she landed. She did land on her concrete driveway. She was there for about an hour before her daughter was able to get to her as she lives alone. She has abrasions to her lip and under her nose, her left forearm and left knee. She is having significant pain in her left wrist. She reports pain is constant and worse with movement. Denies numbness or tingling. She does have erythema and and swelling of her wrist as well. Her left knee has mild constant pain. She is able to bear weight. She denies decreased ROM. It is also red, warm, and a little swollen. Denies LOC or syncope,   ROS As per HPI.      Objective:    BP 93/63   Pulse (!) 102   Temp (!) 97.5 F (36.4 C) (Temporal)   Ht 5' 5.5" (1.664 m)   Wt 140 lb 8 oz (63.7 kg)   SpO2 90%   BMI 23.02 kg/m    Physical Exam Vitals and nursing note reviewed.  Constitutional:      General: She is not in acute distress.    Appearance: She is not ill-appearing or toxic-appearing.  Cardiovascular:     Rate and Rhythm: Normal rate and regular rhythm.     Heart sounds: Normal heart sounds. No murmur heard. Pulmonary:     Effort: Pulmonary effort is normal. No respiratory distress.     Breath sounds: Normal breath sounds. No wheezing, rhonchi or rales.  Musculoskeletal:     Left wrist: Swelling, laceration (shallow, no bleeding), tenderness and bony tenderness present. No effusion.     Left hand: No swelling. Normal range of motion. Normal sensation. There is no disruption of two-point discrimination. Normal capillary refill.     Left knee: Swelling (generalized), erythema  and laceration (shallow abrasion. No active bleeding) present. No effusion or bony tenderness. Tenderness (generalized) present. Normal alignment and normal patellar mobility.     Right lower leg: No edema.     Left lower leg: No edema.  Skin:    General: Skin is warm and dry.  Neurological:     Mental Status: She is alert.     Gait: Gait abnormal (arrives in wheelchair).  Psychiatric:        Mood and Affect: Mood normal.        Behavior: Behavior normal.     No results found for any visits on 10/04/22.      Assessment & Plan:   Keymiah was seen today for fall.  Diagnoses and all orders for this visit:  Fall, initial encounter -     DG Wrist Complete Left; Future -     DG Knee 1-2 Views Left; Future -     Ambulatory referral to Orthopedic Surgery  Acute pain of left knee Xray negative for acute findings. Agree with radiology reading. RICE therapy discussed.  -     DG Knee 1-2 Views Left; Future  Multiple abrasions Discussed home wound care. Follow up for signs of infection.  Left wrist pain Xray  with comminuted fracture of the distal radial metaphysis with apex anterior angulation. Also with ulnar styloid fracture. STAT referral to ortho. Agree with radiology report. Wrist splinted now. Tramadol prn for pain. PDMP reviewed, no red flags and daughter will be with patient.  -     DG Wrist Complete Left; Future -     Ambulatory referral to Orthopedic Surgery  Closed nondisplaced fracture of styloid process of left ulna, initial encounter -     Ambulatory referral to Orthopedic Surgery -     traMADol (ULTRAM) 50 MG tablet; Take 1 tablet (50 mg total) by mouth every 8 (eight) hours as needed for up to 5 days.  The patient indicates understanding of these issues and agrees with the plan.   Gwenlyn Perking, FNP

## 2022-10-10 ENCOUNTER — Telehealth: Payer: Self-pay

## 2022-10-10 NOTE — Telephone Encounter (Signed)
We have a referral in our [facility] for a Urgent appointment. The patient has a Closed nondisplaced fracture of styloid process of left ulna,  Will you look at the x-rays and see if you need to see her or if its something I can schedule with Dr. Luna Glasgow.  Thank you

## 2022-10-18 DIAGNOSIS — S52502A Unspecified fracture of the lower end of left radius, initial encounter for closed fracture: Secondary | ICD-10-CM | POA: Diagnosis not present

## 2022-10-26 DIAGNOSIS — S52572A Other intraarticular fracture of lower end of left radius, initial encounter for closed fracture: Secondary | ICD-10-CM | POA: Diagnosis not present

## 2022-10-31 ENCOUNTER — Ambulatory Visit (HOSPITAL_COMMUNITY)
Admission: RE | Admit: 2022-10-31 | Discharge: 2022-10-31 | Disposition: A | Discharge status: Home / Self Care | Payer: Medicare PPO | Source: Ambulatory Visit | Attending: Hematology | Admitting: Hematology

## 2022-10-31 ENCOUNTER — Inpatient Hospital Stay: Payer: Medicare PPO | Attending: Hematology

## 2022-10-31 DIAGNOSIS — Z79899 Other long term (current) drug therapy: Secondary | ICD-10-CM | POA: Diagnosis not present

## 2022-10-31 DIAGNOSIS — Z9012 Acquired absence of left breast and nipple: Secondary | ICD-10-CM | POA: Insufficient documentation

## 2022-10-31 DIAGNOSIS — R197 Diarrhea, unspecified: Secondary | ICD-10-CM | POA: Diagnosis not present

## 2022-10-31 DIAGNOSIS — Z800 Family history of malignant neoplasm of digestive organs: Secondary | ICD-10-CM | POA: Insufficient documentation

## 2022-10-31 DIAGNOSIS — C3412 Malignant neoplasm of upper lobe, left bronchus or lung: Secondary | ICD-10-CM | POA: Diagnosis not present

## 2022-10-31 DIAGNOSIS — R42 Dizziness and giddiness: Secondary | ICD-10-CM | POA: Insufficient documentation

## 2022-10-31 DIAGNOSIS — R0602 Shortness of breath: Secondary | ICD-10-CM | POA: Diagnosis not present

## 2022-10-31 DIAGNOSIS — Z853 Personal history of malignant neoplasm of breast: Secondary | ICD-10-CM | POA: Diagnosis not present

## 2022-10-31 DIAGNOSIS — Z905 Acquired absence of kidney: Secondary | ICD-10-CM | POA: Diagnosis not present

## 2022-10-31 DIAGNOSIS — D509 Iron deficiency anemia, unspecified: Secondary | ICD-10-CM

## 2022-10-31 DIAGNOSIS — C642 Malignant neoplasm of left kidney, except renal pelvis: Secondary | ICD-10-CM | POA: Diagnosis not present

## 2022-10-31 DIAGNOSIS — Z87442 Personal history of urinary calculi: Secondary | ICD-10-CM | POA: Diagnosis not present

## 2022-10-31 DIAGNOSIS — C3492 Malignant neoplasm of unspecified part of left bronchus or lung: Secondary | ICD-10-CM

## 2022-10-31 DIAGNOSIS — Z801 Family history of malignant neoplasm of trachea, bronchus and lung: Secondary | ICD-10-CM | POA: Insufficient documentation

## 2022-10-31 DIAGNOSIS — Z87891 Personal history of nicotine dependence: Secondary | ICD-10-CM | POA: Diagnosis not present

## 2022-10-31 DIAGNOSIS — Z9049 Acquired absence of other specified parts of digestive tract: Secondary | ICD-10-CM | POA: Diagnosis not present

## 2022-10-31 DIAGNOSIS — C3490 Malignant neoplasm of unspecified part of unspecified bronchus or lung: Secondary | ICD-10-CM | POA: Diagnosis not present

## 2022-10-31 DIAGNOSIS — I700 Atherosclerosis of aorta: Secondary | ICD-10-CM | POA: Diagnosis not present

## 2022-10-31 DIAGNOSIS — J9809 Other diseases of bronchus, not elsewhere classified: Secondary | ICD-10-CM | POA: Diagnosis not present

## 2022-10-31 DIAGNOSIS — J439 Emphysema, unspecified: Secondary | ICD-10-CM | POA: Diagnosis not present

## 2022-10-31 DIAGNOSIS — R911 Solitary pulmonary nodule: Secondary | ICD-10-CM | POA: Diagnosis not present

## 2022-10-31 LAB — CBC WITH DIFFERENTIAL/PLATELET
Abs Immature Granulocytes: 0.01 10*3/uL (ref 0.00–0.07)
Basophils Absolute: 0 10*3/uL (ref 0.0–0.1)
Basophils Relative: 1 %
Eosinophils Absolute: 0.2 10*3/uL (ref 0.0–0.5)
Eosinophils Relative: 4 %
HCT: 39.5 % (ref 36.0–46.0)
Hemoglobin: 12.6 g/dL (ref 12.0–15.0)
Immature Granulocytes: 0 %
Lymphocytes Relative: 21 %
Lymphs Abs: 1.1 10*3/uL (ref 0.7–4.0)
MCH: 30 pg (ref 26.0–34.0)
MCHC: 31.9 g/dL (ref 30.0–36.0)
MCV: 94 fL (ref 80.0–100.0)
Monocytes Absolute: 0.3 10*3/uL (ref 0.1–1.0)
Monocytes Relative: 5 %
Neutro Abs: 3.8 10*3/uL (ref 1.7–7.7)
Neutrophils Relative %: 69 %
Platelets: 284 10*3/uL (ref 150–400)
RBC: 4.2 MIL/uL (ref 3.87–5.11)
RDW: 13.7 % (ref 11.5–15.5)
WBC: 5.5 10*3/uL (ref 4.0–10.5)
nRBC: 0 % (ref 0.0–0.2)

## 2022-10-31 LAB — COMPREHENSIVE METABOLIC PANEL
ALT: 9 U/L (ref 0–44)
AST: 23 U/L (ref 15–41)
Albumin: 4.2 g/dL (ref 3.5–5.0)
Alkaline Phosphatase: 94 U/L (ref 38–126)
Anion gap: 10 (ref 5–15)
BUN: 19 mg/dL (ref 8–23)
CO2: 25 mmol/L (ref 22–32)
Calcium: 9.5 mg/dL (ref 8.9–10.3)
Chloride: 103 mmol/L (ref 98–111)
Creatinine, Ser: 1.04 mg/dL — ABNORMAL HIGH (ref 0.44–1.00)
GFR, Estimated: 54 mL/min — ABNORMAL LOW (ref >60)
Glucose, Bld: 110 mg/dL — ABNORMAL HIGH (ref 70–99)
Potassium: 4 mmol/L (ref 3.5–5.1)
Sodium: 138 mmol/L (ref 135–145)
Total Bilirubin: 0.8 mg/dL (ref 0.3–1.2)
Total Protein: 7.7 g/dL (ref 6.5–8.1)

## 2022-10-31 LAB — IRON AND TIBC
Iron: 67 ug/dL (ref 28–170)
Saturation Ratios: 21 % (ref 10.4–31.8)
TIBC: 313 ug/dL (ref 250–450)
UIBC: 246 ug/dL

## 2022-10-31 LAB — FERRITIN: Ferritin: 54 ng/mL (ref 11–307)

## 2022-11-01 ENCOUNTER — Encounter: Payer: Self-pay | Admitting: Radiology

## 2022-11-06 NOTE — Progress Notes (Signed)
[name] [address] [place] [address]    Clinic Day:  11/06/2022  Referring physician: Mechele Claude, MD  Patient Care Team: Mechele Claude, MD as PCP - General (Family Medicine) Doreatha Massed, MD as Consulting Physician (Hematology) [facility] [place], MD as Attending Physician   ASSESSMENT & PLAN:   Assessment: 1.  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 -Brain MRI on 12/07/2019 with no metastatic disease. -Adjuvant chemotherapy was not offered based on poor performance status and weight loss. -CT chest on 01/08/2020 did not show any evidence of local recurrence left lung.  Trace loculated medial left pleural effusion.  No evidence of metastatic disease in the chest.  Tiny medial right upper lobe 3 mm solid pulmonary nodule decreased in the interval, presumably benign. -CT chest with contrast on 06/21/2020 with no evidence of recurrence or metastatic disease.  3 mm nodule in the medial right chest is unchanged. -CT chest with contrast on 09/09/2020 showed stable postoperative changes from left upper lobectomy with no evidence of recurrence or metastasis.   2.  Normocytic anemia: -Feraheme on 03/25/2020 and 04/01/2020.   3.  Left kidney clear cell renal cell carcinoma: -Left radical nephrectomy on 03/25/2019.  Pathology shows RCC, conventional clear cell type, nuclear grade 2 (5.8 cm).  Tumor invades anterior perinephric fat and segmental renal vein (P T3a), ureteral, vascular normal resection margins are negative.  Plan: 1.  Stage IIb (T1BN1) squamous cell carcinoma of the left upper lobe of the lung: - She reportedly fell and broke her left wrist and had surgery.  She is feeling bad since then. - We reviewed CT chest (10/31/2022): Stable postsurgical changes.  Stable 4 mm right apical lung nodule.  No evidence of recurrence. - Recommend follow-up in 6 months with repeat CT  scan.   2.  Normocytic anemia: - Continue iron tablet with vitamin C.  Hemoglobin is 12.6.  Ferritin is stable at 54. - Will repeat ferritin and iron panel at next visit.  No orders of the defined types were placed in this encounter.     I,Alexis Herring,acting as a Neurosurgeon for [employer], MD.,have documented all relevant documentation on the behalf of Doreatha Massed, MD,as directed by  Doreatha Massed, MD while in the presence of Doreatha Massed, MD.   I, Doreatha Massed MD, have reviewed the above documentation for accuracy and completeness, and I agree with the above.   Alexis Herring   3/5/20248:18 PM  CHIEF COMPLAINT:   Diagnosis: squamous cell carcinoma of left lung and IDA   Cancer Staging  Adenocarcinoma of left breast Staging form: Breast, AJCC 7th Edition - Clinical: Stage IIA (T2, N0, cM0) - Signed by Ellouise Newer, PA on 04/16/2012  Clear cell renal cell carcinoma, left (HCC) Staging form: Kidney, AJCC 8th Edition - Clinical: Stage III (cT3a, cNX, cM0) - Signed by Doreatha Massed, MD on 04/13/2019  Lung cancer [facility]) Staging form: Lung, AJCC 8th Edition - Clinical stage from 10/26/2019: Stage IIB (cT1b, cN1, cM0) - Signed by Doreatha Massed, MD on 10/26/2019    Prior Therapy: 1. Left upper lobe lobectomy on 09/11/2019. 2. Intermittent Feraheme last on 04/01/2020.  Current Therapy:  iron tablets daily   HISTORY OF PRESENT ILLNESS:   Oncology History  Clear cell renal cell carcinoma, left (HCC)  04/13/2019 Initial Diagnosis   Clear cell renal cell carcinoma, left (HCC)   04/13/2019 Cancer Staging  Staging form: Kidney, AJCC 8th Edition - Clinical: Stage III (cT3a, cNX, cM0) - Signed by Doreatha Massed, MD on 04/13/2019   Lung cancer (HCC)  09/11/2019 Initial Diagnosis   Lung cancer (HCC)   10/26/2019 Cancer Staging   Staging form: Lung, AJCC 8th Edition - Clinical stage from 10/26/2019: Stage IIB (cT1b, cN1, cM0)  - Signed by Doreatha Massed, MD on 10/26/2019      INTERVAL HISTORY:   [name] is a 82 y.o. female presenting to clinic today for follow up of squamous cell carcinoma of left lung and IDA. She was last seen by me on 05/03/22.  Today, she states that she is doing well overall. Her appetite level is at 100%. Her energy level is at 25%. She denies any new chest pain/cough/hemoptysis.   PAST MEDICAL HISTORY:   Past Medical History: Past Medical History:  Diagnosis Date   Adenocarcinoma of left breast 04/16/2012   Stage II (T2 N0 M0) left-sided adenocarcinoma breast diagnosed in 1990 by Dr. Malvin Johns and treated by Dr. Berton Mount on NSAB PPD-19 with CMF followed by 5 years of tamoxifen and she remains disease free thus far.    Breast cancer (HCC)    left breast/mastectomy/dx approx1990   COPD (chronic obstructive pulmonary disease) (HCC)    Gall bladder stones    Grave's disease    Headache    Migraines   High triglycerides    History of kidney stones    small stone  rt    Hyperthyroidism    Nerve compression    pinched in neck   Pneumonia 1990   PONV (postoperative nausea and vomiting)    Sepsis due to Escherichia coli with acute renal failure without septic shock [facility])     Surgical History: Past Surgical History:  Procedure Laterality Date   CARPAL TUNNEL RELEASE     rt. hand   cataract surgery     bilateral   CHOLECYSTECTOMY N/A 05/05/2021   Procedure: LAPAROSCOPIC CHOLECYSTECTOMY;  Surgeon: Berna Bue, MD;  Location: [facility] OR;  Service: General;  Laterality: N/A;   COLONOSCOPY     EYE SURGERY     LAPAROSCOPIC NEPHRECTOMY Left 03/25/2019   Procedure: LAPAROSCOPIC RADICAL NEPHRECTOMY;  Surgeon: Rene Paci, MD;  Location: [facility] ORS;  Service: Urology;  Laterality: Left;   MASTECTOMY     left.   NEPHRECTOMY Left    NODE DISSECTION Left 09/11/2019   Procedure: Node Dissection;  Surgeon: Loreli Slot, MD;  Location: [facility] OR;  Service: Thoracic;   Laterality: Left;   THORACOTOMY/LOBECTOMY Left 09/11/2019   Procedure: Thoracotomy/Lobectomy Left Upper Lobe;  Surgeon: Loreli Slot, MD;  Location: [facility] OR;  Service: Thoracic;  Laterality: Left;   TONSILLECTOMY      Social History: Social History   Socioeconomic History   Marital status: Divorced    Spouse name: Not on file   Number of children: 2   Years of education: Not on file   Highest education level: Not on file  Occupational History   Occupation: retired  Tobacco Use   Smoking status: Former    Packs/day: 1.00    Years: 60.00    Total pack years: 60.00    Types: Cigarettes    Quit date: 09/22/2019    Years since quitting: 3.1   Smokeless tobacco: Never   Tobacco comments:    attempted to stop but its expensive to quit  Vaping Use   Vaping Use: Never used  Substance and Sexual Activity  Alcohol use: No   Drug use: No   Sexual activity: Not on file  Other Topics Concern   Not on file  Social History Narrative   Lives alone - has a basement but doesn't use it   Daughter Zettie Cooley checks on her daily   Daughter Vernetta Honey also helps a lot   Social Determinants of Health   Financial Resource Strain: Low Risk  (09/29/2021)   Overall Financial Resource Strain (CARDIA)    Difficulty of Paying Living Expenses: Not very hard  Food Insecurity: No Food Insecurity (09/29/2021)   Hunger Vital Sign    Worried About Running Out of Food in the Last Year: Never true    Ran Out of Food in the Last Year: Never true  Transportation Needs: No Transportation Needs (09/29/2021)   PRAPARE - Administrator, Civil Service (Medical): No    Lack of Transportation (Non-Medical): No  Physical Activity: Insufficiently Active (09/29/2021)   Exercise Vital Sign    Days of Exercise per Week: 7 days    Minutes of Exercise per Session: 10 min  Stress: No Stress Concern Present (09/29/2021)   [employer] of Occupational Health - Occupational Stress Questionnaire     Feeling of Stress : Not at all  Social Connections: Socially Isolated (09/29/2021)   Social Connection and Isolation Panel [NHANES]    Frequency of Communication with Friends and Family: More than three times a week    Frequency of Social Gatherings with Friends and Family: More than three times a week    Attends Religious Services: Never    Database administrator or Organizations: No    Attends Banker Meetings: Never    Marital Status: Widowed  Intimate Partner Violence: Not At Risk (09/29/2021)   Humiliation, Afraid, Rape, and Kick questionnaire    Fear of Current or Ex-Partner: No    Emotionally Abused: No    Physically Abused: No    Sexually Abused: No    Family History: Family History  Problem Relation Age of Onset   Cancer Father        lung cancer   Cancer Brother        colon cancer   Cancer Other        lung cancer/died age 35    Current Medications:  Current Outpatient Medications:    acetaminophen (TYLENOL) 500 MG tablet, Take 500 mg by mouth every 8 (eight) hours as needed for moderate pain., Disp: , Rfl:    atorvastatin (LIPITOR) 20 MG tablet, TAKE 1 TABLET EVERY DAY FOR CHOLESTEROL, Disp: 90 tablet, Rfl: 1   Calcium-Vitamin D (CVS CALCIUM-600/VIT D PO), Take 1 capsule by mouth in the morning and at bedtime., Disp: , Rfl:    ferrous sulfate 325 (65 FE) MG EC tablet, Take 325 mg by mouth daily., Disp: , Rfl:    hydroxypropyl methylcellulose / hypromellose (ISOPTO TEARS / GONIOVISC) 2.5 % ophthalmic solution, Place 1 drop into both eyes 3 (three) times daily as needed for dry eyes. , Disp: , Rfl:    levothyroxine (SYNTHROID) 100 MCG tablet, Take 1 tablet (100 mcg total) by mouth daily before breakfast., Disp: 90 tablet, Rfl: 3   meclizine (ANTIVERT) 25 MG tablet, Take 25 mg by mouth daily as needed for dizziness or nausea., Disp: , Rfl:    Multiple Vitamins-Minerals (CENTRUM SILVER ULTRA WOMENS) TABS, Take 1 tablet by mouth daily., Disp: , Rfl:     Allergies: Allergies  Allergen Reactions  Dicyclomine Hcl Itching   Chantix [Varenicline]     depression    REVIEW OF SYSTEMS:   Review of Systems  Constitutional:  Negative for chills, fatigue and fever.  HENT:   Negative for lump/mass, mouth sores, nosebleeds, sore throat and trouble swallowing.   Eyes:  Negative for eye problems.  Respiratory:  Negative for cough and shortness of breath.   Cardiovascular:  Negative for chest pain, leg swelling and palpitations.  Gastrointestinal:  Negative for abdominal pain, constipation, diarrhea, nausea and vomiting.  Genitourinary:  Negative for bladder incontinence, difficulty urinating, dysuria, frequency, hematuria and nocturia.   Musculoskeletal:  Negative for arthralgias, back pain, flank pain, myalgias and neck pain.  Skin:  Negative for itching and rash.  Neurological:  Negative for dizziness, headaches and numbness.  Hematological:  Does not bruise/bleed easily.  Psychiatric/Behavioral:  Negative for depression, sleep disturbance and suicidal ideas. The patient is not nervous/anxious.   All other systems reviewed and are negative.    VITALS:   There were no vitals taken for this visit.  Wt Readings from Last 3 Encounters:  10/04/22 63.7 kg (140 lb 8 oz)  05/28/22 64.4 kg (142 lb)  03/12/22 63.2 kg (139 lb 6.4 oz)    There is no height or weight on file to calculate BMI.  Performance status (ECOG): 1 - Symptomatic but completely ambulatory  PHYSICAL EXAM:   Physical Exam Vitals and nursing note reviewed. Exam conducted with a chaperone present.  Constitutional:      Appearance: Normal appearance.  Cardiovascular:     Rate and Rhythm: Normal rate and regular rhythm.     Pulses: Normal pulses.     Heart sounds: Normal heart sounds.  Pulmonary:     Effort: Pulmonary effort is normal.     Breath sounds: Normal breath sounds.  Abdominal:     Palpations: Abdomen is soft. There is no hepatomegaly, splenomegaly or mass.      Tenderness: There is no abdominal tenderness.  Musculoskeletal:     Right lower leg: No edema.     Left lower leg: No edema.  Lymphadenopathy:     Cervical: No cervical adenopathy.     Right cervical: No superficial, deep or posterior cervical adenopathy.    Left cervical: No superficial, deep or posterior cervical adenopathy.     Upper Body:     Right upper body: No supraclavicular or axillary adenopathy.     Left upper body: No supraclavicular or axillary adenopathy.  Neurological:     General: No focal deficit present.     Mental Status: She is alert and oriented to person, place, and time.  Psychiatric:        Mood and Affect: Mood normal.        Behavior: Behavior normal.     LABS:      Latest Ref Rng & Units 10/31/2022    9:06 AM 05/21/2022    9:38 AM 04/26/2022    9:03 AM  CBC  WBC 4.0 - 10.5 K/uL 5.5  6.2  5.2   Hemoglobin 12.0 - 15.0 g/dL 52.8  41.3  24.4   Hematocrit 36.0 - 46.0 % 39.5  40.0  39.4   Platelets 150 - 400 K/uL 284  243  218       Latest Ref Rng & Units 10/31/2022    9:06 AM 05/21/2022    9:38 AM 04/26/2022    9:03 AM  CMP  Glucose 70 - 99 mg/dL 010  86  94  BUN 8 - 23 mg/dL 19  20  13    Creatinine 0.44 - 1.00 mg/dL 1.61  0.96  0.45   Sodium 135 - 145 mmol/L 138  142  143   Potassium 3.5 - 5.1 mmol/L 4.0  5.1  4.5   Chloride 98 - 111 mmol/L 103  104  107   CO2 22 - 32 mmol/L 25  23  26    Calcium 8.9 - 10.3 mg/dL 9.5  9.7  9.4   Total Protein 6.5 - 8.1 g/dL 7.7  7.1  7.7   Total Bilirubin 0.3 - 1.2 mg/dL 0.8  0.4  0.9   Alkaline Phos 38 - 126 U/L 94  93  78   AST 15 - 41 U/L 23  20  22    ALT 0 - 44 U/L 9  11  18       Lab Results  Component Value Date   CEA 20.0 (H) 04/13/2016   /  CEA  Date Value Ref Range Status  04/13/2016 20.0 (H) 0.0 - 4.7 ng/mL Final    Comment:    (NOTE)       Roche ECLIA methodology       Nonsmokers  <3.9                                     Smokers     <5.6 Performed At: [facility] [address] [place], [place] [id number] Mila Homer MD NW:[id number]    No results found for: "PSA1" No results found for: "CAN199" No results found for: "CAN125"  No results found for: "TOTALPROTELP", "ALBUMINELP", "A1GS", "A2GS", "BETS", "BETA2SER", "GAMS", "MSPIKE", "SPEI" Lab Results  Component Value Date   TIBC 313 10/31/2022   TIBC 295 05/21/2022   TIBC 348 04/26/2022   FERRITIN 54 10/31/2022   FERRITIN 54 05/21/2022   FERRITIN 31 04/26/2022   IRONPCTSAT 21 10/31/2022   IRONPCTSAT 29 05/21/2022   IRONPCTSAT 23 04/26/2022   Lab Results  Component Value Date   LDH 146 07/08/2019   LDH 127 01/29/2019     STUDIES:   CT Chest Wo Contrast  Result Date: 11/01/2022 CLINICAL DATA:  Non-small cell lung cancer. Restaging. * Tracking Code: BO * EXAM: CT CHEST WITHOUT CONTRAST TECHNIQUE: Multidetector CT imaging of the chest was performed following the standard protocol without IV contrast. RADIATION DOSE REDUCTION: This exam was performed according to the departmental dose-optimization program which includes automated exposure control, adjustment of the mA and/or kV according to patient size and/or use of iterative reconstruction technique. COMPARISON:  04/26/2022 FINDINGS: Cardiovascular: Heart size is normal. Aortic atherosclerosis. Coronary artery calcifications. No pericardial effusion. Mediastinum/Nodes: No enlarged mediastinal or axillary lymph nodes. Previous left axillary node dissection. Hilar lymph nodes are suboptimally evaluated due to lack of IV contrast. Thyroid gland, trachea, and esophagus demonstrate no significant findings. Lungs/Pleura: Moderate paraseptal and centrilobular emphysema with diffuse bronchial wall thickening. Status post left upper lobectomy. Biapical pleuroparenchymal scarring is unchanged. Tiny nodule within the right apex measures 4 mm, image 23/4. On the previous exam this measured the same. No new or suspicious lung nodules identified. Upper Abdomen: No  acute findings. Similar appearance of low-attenuation adrenal thickening. Musculoskeletal: No chest wall mass or suspicious bone lesions identified. Status post left mastectomy IMPRESSION: 1. Stable CT of the chest status post left upper lobectomy. No specific findings identified to suggest residual or recurrent tumor or metastatic disease.  2. Stable 4 mm right apical lung nodule. 3. Diffuse bronchial wall thickening with emphysema, as above; imaging findings suggestive of underlying COPD. 4. Coronary artery calcifications. 5. Aortic Atherosclerosis (ICD10-I70.0) and Emphysema (ICD10-J43.9). Electronically Signed   By: Signa Kell M.D.   On: 11/01/2022 08:50

## 2022-11-07 ENCOUNTER — Encounter: Payer: Self-pay | Admitting: Hematology

## 2022-11-07 ENCOUNTER — Inpatient Hospital Stay: Payer: Medicare PPO | Attending: Hematology | Admitting: Hematology

## 2022-11-07 VITALS — BP 100/68 | HR 122 | Temp 97.8°F | Resp 18 | Wt 132.2 lb

## 2022-11-07 DIAGNOSIS — Z85118 Personal history of other malignant neoplasm of bronchus and lung: Secondary | ICD-10-CM | POA: Diagnosis not present

## 2022-11-07 DIAGNOSIS — D649 Anemia, unspecified: Secondary | ICD-10-CM | POA: Insufficient documentation

## 2022-11-07 DIAGNOSIS — Z905 Acquired absence of kidney: Secondary | ICD-10-CM | POA: Insufficient documentation

## 2022-11-07 DIAGNOSIS — Z79899 Other long term (current) drug therapy: Secondary | ICD-10-CM | POA: Insufficient documentation

## 2022-11-07 DIAGNOSIS — C3492 Malignant neoplasm of unspecified part of left bronchus or lung: Secondary | ICD-10-CM

## 2022-11-07 DIAGNOSIS — Z85528 Personal history of other malignant neoplasm of kidney: Secondary | ICD-10-CM | POA: Insufficient documentation

## 2022-11-07 DIAGNOSIS — Z9012 Acquired absence of left breast and nipple: Secondary | ICD-10-CM | POA: Diagnosis not present

## 2022-11-07 DIAGNOSIS — Z9049 Acquired absence of other specified parts of digestive tract: Secondary | ICD-10-CM | POA: Insufficient documentation

## 2022-11-07 DIAGNOSIS — Z902 Acquired absence of lung [part of]: Secondary | ICD-10-CM | POA: Insufficient documentation

## 2022-11-07 DIAGNOSIS — Z853 Personal history of malignant neoplasm of breast: Secondary | ICD-10-CM | POA: Insufficient documentation

## 2022-11-07 NOTE — Patient Instructions (Signed)
[place] at [facility] Discharge Instructions   You were seen and examined today by Dr. Delton Coombes.  He reviewed the results of the CT scan which are normal/stable.   He reviewed the results of your lab work which are normal/stable.   We will see you back in 6 months. We will repeat a CT scan and lab work prior to your next visit.    Thank you for choosing [place] at [facility] to provide your oncology and hematology care.  To afford each patient quality time with our provider, please arrive at least 15 minutes before your scheduled appointment time.   If you have a lab appointment with the [facility] please come in thru the Main Entrance and check in at the main information desk.  You need to re-schedule your appointment should you arrive 10 or more minutes late.  We strive to give you quality time with our providers, and arriving late affects you and other patients whose appointments are after yours.  Also, if you no show three or more times for appointments you may be dismissed from the clinic at the providers discretion.     Again, thank you for choosing [facility].  Our hope is that these requests will decrease the amount of time that you wait before being seen by our physicians.       _____________________________________________________________  Should you have questions after your visit to [facility], please contact our office at [phone] and follow the prompts.  Our office hours are 8:00 a.m. and 4:30 p.m. Monday - Friday.  Please note that voicemails left after 4:00 p.m. may not be returned until the following business day.  We are closed weekends and major holidays.  You do have access to a nurse 24-7, just call the main number to the clinic [phone] and do not press any options, hold on the line and a nurse will answer the phone.    For prescription refill requests, have your pharmacy  contact our office and allow 72 hours.    Due to Covid, you will need to wear a mask upon entering the hospital. If you do not have a mask, a mask will be given to you at the Main Entrance upon arrival. For doctor visits, patients may have 1 support person age 7 or older with them. For treatment visits, patients can not have anyone with them due to social distancing guidelines and our immunocompromised population.

## 2022-11-09 ENCOUNTER — Other Ambulatory Visit: Payer: Self-pay | Admitting: Family Medicine

## 2022-11-09 DIAGNOSIS — S52502D Unspecified fracture of the lower end of left radius, subsequent encounter for closed fracture with routine healing: Secondary | ICD-10-CM | POA: Diagnosis not present

## 2022-11-09 NOTE — Telephone Encounter (Signed)
[facility] NTBS March for 6 mos FU, pt cancelled has not resched yet. RF SENT to mail order

## 2022-11-09 NOTE — Telephone Encounter (Signed)
I called pt & made an appt on 12-06-2022 w/[facility].

## 2022-11-23 DIAGNOSIS — M25632 Stiffness of left wrist, not elsewhere classified: Secondary | ICD-10-CM | POA: Diagnosis not present

## 2022-11-23 DIAGNOSIS — S52502D Unspecified fracture of the lower end of left radius, subsequent encounter for closed fracture with routine healing: Secondary | ICD-10-CM | POA: Diagnosis not present

## 2022-11-27 ENCOUNTER — Ambulatory Visit: Payer: Medicare PPO | Admitting: Family Medicine

## 2022-11-30 DIAGNOSIS — M25632 Stiffness of left wrist, not elsewhere classified: Secondary | ICD-10-CM | POA: Diagnosis not present

## 2022-12-06 ENCOUNTER — Ambulatory Visit (INDEPENDENT_AMBULATORY_CARE_PROVIDER_SITE_OTHER): Payer: Medicare PPO | Admitting: Family Medicine

## 2022-12-06 ENCOUNTER — Encounter: Payer: Self-pay | Admitting: Family Medicine

## 2022-12-06 VITALS — BP 115/68 | HR 103 | Temp 98.0°F | Ht 65.5 in | Wt 132.2 lb

## 2022-12-06 DIAGNOSIS — S62102D Fracture of unspecified carpal bone, left wrist, subsequent encounter for fracture with routine healing: Secondary | ICD-10-CM

## 2022-12-06 DIAGNOSIS — K5900 Constipation, unspecified: Secondary | ICD-10-CM | POA: Diagnosis not present

## 2022-12-06 DIAGNOSIS — E039 Hypothyroidism, unspecified: Secondary | ICD-10-CM | POA: Diagnosis not present

## 2022-12-06 DIAGNOSIS — S62102A Fracture of unspecified carpal bone, left wrist, initial encounter for closed fracture: Secondary | ICD-10-CM

## 2022-12-06 NOTE — Progress Notes (Signed)
Subjective:  Patient ID: [name], female    DOB: [birth year]  Age: 82 y.o. MRN: [id number]  CC: Medical Management of Chronic Issues   HPI [name] presents for  follow-up on  thyroid. The patient has a history of hypothyroidism for many years. It has been stable recently. Pt. denies any change in  voice, loss of hair, heat or cold intolerance. Energy level has been adequate to good. Patient denies constipation and diarrhea. No myxedema. Medication is as noted below. Verified that pt is taking it daily on an empty stomach. Well tolerated.  Had left wrist surgery for fx 3-4 weeks ago. ORIF. Still frustrated by pain.      12/06/2022   11:31 AM 12/06/2022   11:06 AM 05/28/2022    8:10 AM  Depression screen PHQ 2/9  Decreased Interest 1 0 0  Down, Depressed, Hopeless 0 0 0  PHQ - 2 Score 1 0 0  Altered sleeping 1    Tired, decreased energy 1    Change in appetite 0    Feeling bad or failure about yourself  0    Trouble concentrating 0    Moving slowly or fidgety/restless 0    Suicidal thoughts 0    PHQ-9 Score 3    Difficult doing work/chores Somewhat difficult      History [name] has a past medical history of Adenocarcinoma of left breast (04/16/2012), Breast cancer, COPD (chronic obstructive pulmonary disease), Gall bladder stones, Grave's disease, Headache, High triglycerides, History of kidney stones, Hyperthyroidism, Nerve compression, Pneumonia (1990), PONV (postoperative nausea and vomiting), and Sepsis due to Escherichia coli with acute renal failure without septic shock.   [name] has a past surgical history that includes [name]; Carpal tunnel release; cataract surgery; Tonsillectomy; Eye surgery; Laparoscopic nephrectomy (Left, 03/25/2019); Colonoscopy; Node dissection (Left, 09/11/2019); Thoracotomy/lobectomy (Left, 09/11/2019); Nephrectomy (Left); and Cholecystectomy (N/A, 05/05/2021).   Her family history includes Cancer in her brother, father, and another family  member.[name] reports that [name] quit smoking about 3 years ago. Her smoking use included cigarettes. [name] has a 60.00 pack-year smoking history. [name] has never used smokeless tobacco. [name] reports that [name] does not drink alcohol and does not use drugs.    ROS Review of Systems  Constitutional: Negative.   HENT: Negative.    Eyes:  Negative for visual disturbance.  Respiratory:  Negative for shortness of breath.   Cardiovascular:  Negative for chest pain.  Gastrointestinal:  Positive for constipation. Negative for abdominal pain.  Musculoskeletal:  Negative for arthralgias.    Objective:  BP 115/68   Pulse (!) 103   Temp 98 F (36.7 C)   Ht 5' 5.5" (1.664 m)   Wt 132 lb 3.2 oz (60 kg)   SpO2 93%   BMI 21.66 kg/m   BP Readings from Last 3 Encounters:  12/06/22 115/68  11/07/22 100/68  10/04/22 93/63    Wt Readings from Last 3 Encounters:  12/06/22 132 lb 3.2 oz (60 kg)  11/07/22 132 lb 3.2 oz (60 kg)  10/04/22 140 lb 8 oz (63.7 kg)     Physical Exam Constitutional:      General: [name] is not in acute distress.    Appearance: [name] is well-developed.  Cardiovascular:     Rate and Rhythm: Normal rate and regular rhythm.  Pulmonary:     Breath sounds: Normal breath sounds.  Musculoskeletal:        General: Tenderness (left wriet) present. Normal range of motion.  Skin:  General: Skin is warm and dry.  Neurological:     Mental Status: [name] is alert and oriented to person, place, and time.       Assessment & Plan:   [name] was seen today for medical management of chronic issues.  Diagnoses and all orders for this visit:  Hypothyroidism, unspecified type -     TSH + free T4 -     CBC with Differential/Platelet -     CMP14+EGFR  Fx. left wrist, closed, initial encounter  Constipation, unspecified constipation type   Discussed OTC treatments for constipation - to use metamucil and coace. Drink 64 oz water daily.     I am having [name] maintain her  Centrum Silver Ultra Womens, hydroxypropyl methylcellulose / hypromellose, meclizine, ferrous sulfate, Calcium-Vitamin D ([employer]S CALCIUM-600/VIT D PO), acetaminophen, levothyroxine, HYDROcodone-acetaminophen, and atorvastatin.  Allergies as of 12/06/2022       Reactions   Dicyclomine Hcl Itching   Chantix [varenicline]    depression        Medication List        Accurate as of December 06, 2022 11:59 PM. If you have any questions, ask your nurse or doctor.          acetaminophen 500 MG tablet Commonly known as: TYLENOL Take 500 mg by mouth every 8 (eight) hours as needed for moderate pain.   atorvastatin 20 MG tablet Commonly known as: LIPITOR TAKE 1 TABLET EVERY DAY FOR CHOLESTEROL   Centrum Silver Ultra Womens Tabs Take 1 tablet by mouth daily.   CVS CALCIUM-600/VIT D PO Take 1 capsule by mouth in the morning and at bedtime.   ferrous sulfate 325 (65 FE) MG EC tablet Take 325 mg by mouth daily.   HYDROcodone-acetaminophen 5-325 MG tablet Commonly known as: NORCO/VICODIN Take 1 tablet by mouth every 6 (six) hours as needed.   hydroxypropyl methylcellulose / hypromellose 2.5 % ophthalmic solution Commonly known as: ISOPTO TEARS / GONIOVISC Place 1 drop into both eyes 3 (three) times daily as needed for dry eyes.   levothyroxine 100 MCG tablet Commonly known as: SYNTHROID Take 1 tablet (100 mcg total) by mouth daily before breakfast.   meclizine 25 MG tablet Commonly known as: ANTIVERT Take 25 mg by mouth daily as needed for dizziness or nausea.         Follow-up: Return in about 6 months (around 06/07/2023).  [name], M.D.

## 2022-12-07 LAB — CBC WITH DIFFERENTIAL/PLATELET
Basophils Absolute: 0.1 10*3/uL (ref 0.0–0.2)
Basos: 1 %
EOS (ABSOLUTE): 0.1 10*3/uL (ref 0.0–0.4)
Eos: 2 %
Hematocrit: 41.1 % (ref 34.0–46.6)
Hemoglobin: 13.4 g/dL (ref 11.1–15.9)
Immature Grans (Abs): 0 10*3/uL (ref 0.0–0.1)
Immature Granulocytes: 0 %
Lymphocytes Absolute: 1.1 10*3/uL (ref 0.7–3.1)
Lymphs: 14 %
MCH: 30.2 pg (ref 26.6–33.0)
MCHC: 32.6 g/dL (ref 31.5–35.7)
MCV: 93 fL (ref 79–97)
Monocytes Absolute: 0.4 10*3/uL (ref 0.1–0.9)
Monocytes: 5 %
Neutrophils Absolute: 5.8 10*3/uL (ref 1.4–7.0)
Neutrophils: 78 %
Platelets: 345 10*3/uL (ref 150–450)
RBC: 4.44 x10E6/uL (ref 3.77–5.28)
RDW: 12.6 % (ref 11.7–15.4)
WBC: 7.4 10*3/uL (ref 3.4–10.8)

## 2022-12-07 LAB — CMP14+EGFR
ALT: 11 IU/L (ref 0–32)
AST: 14 IU/L (ref 0–40)
Albumin/Globulin Ratio: 1.6 (ref 1.2–2.2)
Albumin: 4.6 g/dL (ref 3.8–4.8)
Alkaline Phosphatase: 143 IU/L — ABNORMAL HIGH (ref 44–121)
BUN/Creatinine Ratio: 17 (ref 12–28)
BUN: 24 mg/dL (ref 8–27)
Bilirubin Total: 0.4 mg/dL (ref 0.0–1.2)
CO2: 25 mmol/L (ref 20–29)
Calcium: 9.9 mg/dL (ref 8.7–10.3)
Chloride: 103 mmol/L (ref 96–106)
Creatinine, Ser: 1.38 mg/dL — ABNORMAL HIGH (ref 0.57–1.00)
Globulin, Total: 2.8 g/dL (ref 1.5–4.5)
Glucose: 101 mg/dL — ABNORMAL HIGH (ref 70–99)
Potassium: 4.4 mmol/L (ref 3.5–5.2)
Sodium: 143 mmol/L (ref 134–144)
Total Protein: 7.4 g/dL (ref 6.0–8.5)
eGFR: 39 mL/min/{1.73_m2} — ABNORMAL LOW (ref >59)

## 2022-12-07 LAB — TSH+FREE T4
Free T4: 1.49 ng/dL (ref 0.82–1.77)
TSH: 0.38 u[IU]/mL — ABNORMAL LOW (ref 0.450–4.500)

## 2022-12-08 ENCOUNTER — Encounter: Payer: Self-pay | Admitting: Family Medicine

## 2022-12-21 DIAGNOSIS — S52502D Unspecified fracture of the lower end of left radius, subsequent encounter for closed fracture with routine healing: Secondary | ICD-10-CM | POA: Diagnosis not present

## 2023-01-02 ENCOUNTER — Telehealth: Payer: Self-pay | Admitting: Family Medicine

## 2023-01-02 NOTE — Telephone Encounter (Signed)
Dena (RN) called from [facility] stating that pt had a recent fracture and needs bone density scheduled.

## 2023-01-04 NOTE — Telephone Encounter (Signed)
Called and spoke to patient - she will have [name] call back to schedule

## 2023-01-09 ENCOUNTER — Other Ambulatory Visit: Payer: Self-pay | Admitting: Family Medicine

## 2023-01-18 DIAGNOSIS — S52502D Unspecified fracture of the lower end of left radius, subsequent encounter for closed fracture with routine healing: Secondary | ICD-10-CM | POA: Diagnosis not present

## 2023-01-21 ENCOUNTER — Other Ambulatory Visit: Payer: Self-pay | Admitting: Family Medicine

## 2023-04-04 ENCOUNTER — Other Ambulatory Visit: Payer: Self-pay | Admitting: Family Medicine

## 2023-04-04 NOTE — Telephone Encounter (Signed)
Stacks NTBS in Oct for 6 mos FU RF sent to pharmacy

## 2023-04-04 NOTE — Telephone Encounter (Signed)
Called pt to schedule appt., pt stated she does not drive and [name] her daughter would have to make appt. Pt aware to have daughter call [facility] to schedule appt Attempted to contact pt daughter n/a

## 2023-04-18 ENCOUNTER — Ambulatory Visit: Payer: Medicare PPO | Admitting: Family Medicine

## 2023-04-18 ENCOUNTER — Encounter: Payer: Self-pay | Admitting: Family Medicine

## 2023-04-18 VITALS — BP 116/76 | HR 85 | Temp 97.3°F | Ht 65.5 in | Wt 138.0 lb

## 2023-04-18 DIAGNOSIS — R1010 Upper abdominal pain, unspecified: Secondary | ICD-10-CM | POA: Diagnosis not present

## 2023-04-18 DIAGNOSIS — E782 Mixed hyperlipidemia: Secondary | ICD-10-CM

## 2023-04-18 DIAGNOSIS — D508 Other iron deficiency anemias: Secondary | ICD-10-CM

## 2023-04-18 DIAGNOSIS — E039 Hypothyroidism, unspecified: Secondary | ICD-10-CM

## 2023-04-18 MED ORDER — MECLIZINE HCL 25 MG PO TABS: 25.0000 mg | ORAL_TABLET | Freq: Every day | ORAL | 5 refills | Status: DC | PRN

## 2023-04-18 NOTE — Progress Notes (Signed)
Subjective:  Patient ID: [name], female    DOB: 1941-01-03  Age: 82 y.o. MRN: [id number]  CC: Medical Management of Chronic Issues   HPI [name] presents for follow-up of elevated cholesterol. Doing well without complaints on current medication. Denies side effects of statin including myalgia and arthralgia and nausea. Also in today for liver function testing. Currently no chest pain, shortness of breath or other cardiovascular related symptoms noted.   follow-up on  thyroid. The patient has a history of hypothyroidism for many years. It has been stable recently. Pt. denies any change in  voice, loss of hair, heat or cold intolerance. Energy level has been adequate to good. Patient denies constipation and diarrhea. No myxedema. Medication is as noted below. Verified that pt is taking it daily on an empty stomach. Well tolerated. Taking iron for anemia   History [name] has a past medical history of Adenocarcinoma of left breast (04/16/2012), Breast cancer (HCC), COPD (chronic obstructive pulmonary disease) (HCC), Gall bladder stones, Grave's disease, Headache, High triglycerides, History of kidney stones, Hyperthyroidism, Nerve compression, Pneumonia (1990), PONV (postoperative nausea and vomiting), and Sepsis due to Escherichia coli with acute renal failure without septic shock (HCC).   She has a past surgical history that includes Mastectomy; Carpal tunnel release; cataract surgery; Tonsillectomy; Eye surgery; Laparoscopic nephrectomy (Left, 03/25/2019); Colonoscopy; Node dissection (Left, 09/11/2019); Thoracotomy/lobectomy (Left, 09/11/2019); Nephrectomy (Left); and Cholecystectomy (N/A, 05/05/2021).   Her family history includes Cancer in her brother, father, and another family member.She reports that she quit smoking about 3 years ago. Her smoking use included cigarettes. She started smoking about 63 years ago. She has a 60 pack-year smoking history. She has never used smokeless  tobacco. She reports that she does not drink alcohol and does not use drugs.  Current Outpatient Medications on File Prior to Visit  Medication Sig Dispense Refill   acetaminophen (TYLENOL) 500 MG tablet Take 500 mg by mouth every 8 (eight) hours as needed for moderate pain.     atorvastatin (LIPITOR) 20 MG tablet TAKE 1 TABLET EVERY DAY FOR CHOLESTEROL 90 tablet 0   Calcium-Vitamin D (CVS CALCIUM-600/VIT D PO) Take 1 capsule by mouth in the morning and at bedtime.     ferrous sulfate 325 (65 FE) MG EC tablet Take 325 mg by mouth daily.     HYDROcodone-acetaminophen (NORCO/VICODIN) 5-325 MG tablet Take 1 tablet by mouth every 6 (six) hours as needed.     hydroxypropyl methylcellulose / hypromellose (ISOPTO TEARS / GONIOVISC) 2.5 % ophthalmic solution Place 1 drop into both eyes 3 (three) times daily as needed for dry eyes.      levothyroxine (SYNTHROID) 100 MCG tablet TAKE 1 TABLET EVERY DAY BEFORE BREAKFAST 90 tablet 1   Multiple Vitamins-Minerals (CENTRUM SILVER ULTRA WOMENS) TABS Take 1 tablet by mouth daily.     No current facility-administered medications on file prior to visit.    ROS Review of Systems  Constitutional: Negative.   HENT:  Negative for congestion.   Eyes:  Negative for visual disturbance.  Respiratory:  Negative for shortness of breath.   Cardiovascular:  Negative for chest pain.  Gastrointestinal:  Negative for abdominal pain (intermittent for 3-4 years (cancer surgery then)), constipation, diarrhea, nausea and vomiting.  Genitourinary:  Negative for difficulty urinating.  Musculoskeletal:  Negative for arthralgias and myalgias.  Neurological:  Negative for headaches.  Psychiatric/Behavioral:  Negative for sleep disturbance.     Objective:  BP 116/76   Pulse 85  Temp (!) 97.3 F (36.3 C)   Ht 5' 5.5" (1.664 m)   Wt 138 lb (62.6 kg)   SpO2 91%   BMI 22.62 kg/m   BP Readings from Last 3 Encounters:  04/18/23 116/76  12/06/22 115/68  11/07/22 100/68     Wt Readings from Last 3 Encounters:  04/18/23 138 lb (62.6 kg)  12/06/22 132 lb 3.2 oz (60 kg)  11/07/22 132 lb 3.2 oz (60 kg)     Physical Exam Constitutional:      Appearance: She is well-developed.  HENT:     Head: Normocephalic and atraumatic.  Cardiovascular:     Rate and Rhythm: Normal rate and regular rhythm.     Heart sounds: No murmur heard. Pulmonary:     Effort: Pulmonary effort is normal.     Breath sounds: Normal breath sounds.  Abdominal:     General: Bowel sounds are normal.     Palpations: Abdomen is soft. There is no mass.     Tenderness: There is no guarding or rebound.  Skin:    General: Skin is warm and dry.  Neurological:     Mental Status: She is alert and oriented to person, place, and time.  Psychiatric:        Behavior: Behavior normal.     No results found for: "HGBA1C"  Lab Results  Component Value Date   WBC 7.4 12/06/2022   HGB 13.4 12/06/2022   HCT 41.1 12/06/2022   PLT 345 12/06/2022   GLUCOSE 101 (H) 12/06/2022   CHOL 162 05/21/2022   TRIG 228 (H) 05/21/2022   HDL 51 05/21/2022   LDLCALC 74 05/21/2022   ALT 11 12/06/2022   AST 14 12/06/2022   NA 143 12/06/2022   K 4.4 12/06/2022   CL 103 12/06/2022   CREATININE 1.38 (H) 12/06/2022   BUN 24 12/06/2022   CO2 25 12/06/2022   TSH 0.380 (L) 12/06/2022   INR 1.0 09/09/2019    CT Chest Wo Contrast  Result Date: 11/01/2022 CLINICAL DATA:  Non-small cell lung cancer. Restaging. * Tracking Code: BO * EXAM: CT CHEST WITHOUT CONTRAST TECHNIQUE: Multidetector CT imaging of the chest was performed following the standard protocol without IV contrast. RADIATION DOSE REDUCTION: This exam was performed according to the departmental dose-optimization program which includes automated exposure control, adjustment of the mA and/or kV according to patient size and/or use of iterative reconstruction technique. COMPARISON:  04/26/2022 FINDINGS: Cardiovascular: Heart size is normal. Aortic  atherosclerosis. Coronary artery calcifications. No pericardial effusion. Mediastinum/Nodes: No enlarged mediastinal or axillary lymph nodes. Previous left axillary node dissection. Hilar lymph nodes are suboptimally evaluated due to lack of IV contrast. Thyroid gland, trachea, and esophagus demonstrate no significant findings. Lungs/Pleura: Moderate paraseptal and centrilobular emphysema with diffuse bronchial wall thickening. Status post left upper lobectomy. Biapical pleuroparenchymal scarring is unchanged. Tiny nodule within the right apex measures 4 mm, image 23/4. On the previous exam this measured the same. No new or suspicious lung nodules identified. Upper Abdomen: No acute findings. Similar appearance of low-attenuation adrenal thickening. Musculoskeletal: No chest wall mass or suspicious bone lesions identified. Status post left mastectomy IMPRESSION: 1. Stable CT of the chest status post left upper lobectomy. No specific findings identified to suggest residual or recurrent tumor or metastatic disease. 2. Stable 4 mm right apical lung nodule. 3. Diffuse bronchial wall thickening with emphysema, as above; imaging findings suggestive of underlying COPD. 4. Coronary artery calcifications. 5. Aortic Atherosclerosis (ICD10-I70.0) and Emphysema (ICD10-J43.9). Electronically Signed  By: Signa Kell M.D.   On: 11/01/2022 08:50    Assessment & Plan:   Tanairy was seen today for medical management of chronic issues.  Diagnoses and all orders for this visit:  Hypothyroidism, unspecified type -     CMP14+EGFR -     CBC with Differential/Platelet -     TSH + free T4  Mixed hyperlipidemia -     CMP14+EGFR -     CBC with Differential/Platelet -     Lipid panel  Other iron deficiency anemia  Other orders -     meclizine (ANTIVERT) 25 MG tablet; Take 1 tablet (25 mg total) by mouth daily as needed for dizziness or nausea.   I have changed [name]'s meclizine. I am also having her  maintain her Centrum Silver Ultra Womens, hydroxypropyl methylcellulose / hypromellose, ferrous sulfate, Calcium-Vitamin D (CVS CALCIUM-600/VIT D PO), acetaminophen, HYDROcodone-acetaminophen, levothyroxine, and atorvastatin.  Meds ordered this encounter  Medications   meclizine (ANTIVERT) 25 MG tablet    Sig: Take 1 tablet (25 mg total) by mouth daily as needed for dizziness or nausea.    Dispense:  30 tablet    Refill:  5     Follow-up: Return in about 6 months (around 10/19/2023) for Hypothyroidism, cholesterol.  Mechele Claude, M.D.

## 2023-04-19 LAB — LIPID PANEL
Chol/HDL Ratio: 2.9 ratio (ref 0.0–4.4)
Cholesterol, Total: 152 mg/dL (ref 100–199)
HDL: 52 mg/dL (ref >39)
LDL Chol Calc (NIH): 58 mg/dL (ref 0–99)
Triglycerides: 271 mg/dL — ABNORMAL HIGH (ref 0–149)
VLDL Cholesterol Cal: 42 mg/dL — ABNORMAL HIGH (ref 5–40)

## 2023-04-19 LAB — CBC WITH DIFFERENTIAL/PLATELET
Basophils Absolute: 0.1 10*3/uL (ref 0.0–0.2)
Basos: 1 %
EOS (ABSOLUTE): 0.2 10*3/uL (ref 0.0–0.4)
Eos: 4 %
Hematocrit: 39.5 % (ref 34.0–46.6)
Hemoglobin: 13.2 g/dL (ref 11.1–15.9)
Immature Grans (Abs): 0 10*3/uL (ref 0.0–0.1)
Immature Granulocytes: 0 %
Lymphocytes Absolute: 1.6 10*3/uL (ref 0.7–3.1)
Lymphs: 25 %
MCH: 30.9 pg (ref 26.6–33.0)
MCHC: 33.4 g/dL (ref 31.5–35.7)
MCV: 93 fL (ref 79–97)
Monocytes Absolute: 0.4 10*3/uL (ref 0.1–0.9)
Monocytes: 7 %
Neutrophils Absolute: 4.1 10*3/uL (ref 1.4–7.0)
Neutrophils: 63 %
Platelets: 239 10*3/uL (ref 150–450)
RBC: 4.27 x10E6/uL (ref 3.77–5.28)
RDW: 13.1 % (ref 11.7–15.4)
WBC: 6.4 10*3/uL (ref 3.4–10.8)

## 2023-04-19 LAB — CMP14+EGFR
ALT: 16 IU/L (ref 0–32)
AST: 24 IU/L (ref 0–40)
Albumin: 4.8 g/dL (ref 3.8–4.8)
Alkaline Phosphatase: 97 IU/L (ref 44–121)
BUN/Creatinine Ratio: 12 (ref 12–28)
BUN: 14 mg/dL (ref 8–27)
Bilirubin Total: 0.4 mg/dL (ref 0.0–1.2)
CO2: 23 mmol/L (ref 20–29)
Calcium: 10.2 mg/dL (ref 8.7–10.3)
Chloride: 105 mmol/L (ref 96–106)
Creatinine, Ser: 1.16 mg/dL — ABNORMAL HIGH (ref 0.57–1.00)
Globulin, Total: 2.4 g/dL (ref 1.5–4.5)
Glucose: 116 mg/dL — ABNORMAL HIGH (ref 70–99)
Potassium: 5 mmol/L (ref 3.5–5.2)
Sodium: 144 mmol/L (ref 134–144)
Total Protein: 7.2 g/dL (ref 6.0–8.5)
eGFR: 48 mL/min/{1.73_m2} — ABNORMAL LOW (ref >59)

## 2023-04-19 LAB — TSH+FREE T4
Free T4: 1.51 ng/dL (ref 0.82–1.77)
TSH: 0.265 u[IU]/mL — ABNORMAL LOW (ref 0.450–4.500)

## 2023-04-20 NOTE — Progress Notes (Signed)
Hello [name],  Your lab result is normal and/or stable.Some minor variations that are not significant are commonly marked abnormal, but do not represent any medical problem for you.  Best regards, Claretta Fraise, M.D.

## 2023-04-22 ENCOUNTER — Other Ambulatory Visit (HOSPITAL_COMMUNITY): Payer: Self-pay | Admitting: Family Medicine

## 2023-04-22 DIAGNOSIS — Z1231 Encounter for screening mammogram for malignant neoplasm of breast: Secondary | ICD-10-CM

## 2023-05-10 ENCOUNTER — Inpatient Hospital Stay: Payer: Medicare PPO | Attending: Hematology

## 2023-05-10 ENCOUNTER — Ambulatory Visit (HOSPITAL_COMMUNITY)
Admission: RE | Admit: 2023-05-10 | Discharge: 2023-05-10 | Disposition: A | Discharge status: Home / Self Care | Payer: Medicare PPO | Source: Ambulatory Visit | Attending: Hematology | Admitting: Hematology

## 2023-05-10 DIAGNOSIS — Z801 Family history of malignant neoplasm of trachea, bronchus and lung: Secondary | ICD-10-CM | POA: Diagnosis not present

## 2023-05-10 DIAGNOSIS — C3492 Malignant neoplasm of unspecified part of left bronchus or lung: Secondary | ICD-10-CM | POA: Diagnosis not present

## 2023-05-10 DIAGNOSIS — Z85528 Personal history of other malignant neoplasm of kidney: Secondary | ICD-10-CM | POA: Diagnosis not present

## 2023-05-10 DIAGNOSIS — I700 Atherosclerosis of aorta: Secondary | ICD-10-CM | POA: Diagnosis not present

## 2023-05-10 DIAGNOSIS — D649 Anemia, unspecified: Secondary | ICD-10-CM | POA: Insufficient documentation

## 2023-05-10 DIAGNOSIS — Z9012 Acquired absence of left breast and nipple: Secondary | ICD-10-CM | POA: Insufficient documentation

## 2023-05-10 DIAGNOSIS — Z905 Acquired absence of kidney: Secondary | ICD-10-CM | POA: Insufficient documentation

## 2023-05-10 DIAGNOSIS — Z853 Personal history of malignant neoplasm of breast: Secondary | ICD-10-CM | POA: Diagnosis not present

## 2023-05-10 DIAGNOSIS — Z902 Acquired absence of lung [part of]: Secondary | ICD-10-CM | POA: Diagnosis not present

## 2023-05-10 DIAGNOSIS — Z87891 Personal history of nicotine dependence: Secondary | ICD-10-CM | POA: Diagnosis not present

## 2023-05-10 DIAGNOSIS — Z85118 Personal history of other malignant neoplasm of bronchus and lung: Secondary | ICD-10-CM | POA: Insufficient documentation

## 2023-05-10 DIAGNOSIS — Z800 Family history of malignant neoplasm of digestive organs: Secondary | ICD-10-CM | POA: Insufficient documentation

## 2023-05-10 DIAGNOSIS — J432 Centrilobular emphysema: Secondary | ICD-10-CM | POA: Diagnosis not present

## 2023-05-10 LAB — COMPREHENSIVE METABOLIC PANEL
ALT: 19 U/L (ref 0–44)
AST: 20 U/L (ref 15–41)
Albumin: 4.5 g/dL (ref 3.5–5.0)
Alkaline Phosphatase: 78 U/L (ref 38–126)
Anion gap: 10 (ref 5–15)
BUN: 21 mg/dL (ref 8–23)
CO2: 27 mmol/L (ref 22–32)
Calcium: 9.8 mg/dL (ref 8.9–10.3)
Chloride: 106 mmol/L (ref 98–111)
Creatinine, Ser: 1.18 mg/dL — ABNORMAL HIGH (ref 0.44–1.00)
GFR, Estimated: 47 mL/min — ABNORMAL LOW (ref >60)
Glucose, Bld: 94 mg/dL (ref 70–99)
Potassium: 4.2 mmol/L (ref 3.5–5.1)
Sodium: 143 mmol/L (ref 135–145)
Total Bilirubin: 0.7 mg/dL (ref 0.3–1.2)
Total Protein: 8.1 g/dL (ref 6.5–8.1)

## 2023-05-10 LAB — CBC WITH DIFFERENTIAL/PLATELET
Abs Immature Granulocytes: 0.02 10*3/uL (ref 0.00–0.07)
Basophils Absolute: 0.1 10*3/uL (ref 0.0–0.1)
Basophils Relative: 1 %
Eosinophils Absolute: 0.2 10*3/uL (ref 0.0–0.5)
Eosinophils Relative: 4 %
HCT: 42.6 % (ref 36.0–46.0)
Hemoglobin: 13.4 g/dL (ref 12.0–15.0)
Immature Granulocytes: 0 %
Lymphocytes Relative: 24 %
Lymphs Abs: 1.3 10*3/uL (ref 0.7–4.0)
MCH: 30.6 pg (ref 26.0–34.0)
MCHC: 31.5 g/dL (ref 30.0–36.0)
MCV: 97.3 fL (ref 80.0–100.0)
Monocytes Absolute: 0.3 10*3/uL (ref 0.1–1.0)
Monocytes Relative: 6 %
Neutro Abs: 3.6 10*3/uL (ref 1.7–7.7)
Neutrophils Relative %: 65 %
Platelets: 234 10*3/uL (ref 150–400)
RBC: 4.38 MIL/uL (ref 3.87–5.11)
RDW: 12.8 % (ref 11.5–15.5)
WBC: 5.5 10*3/uL (ref 4.0–10.5)
nRBC: 0 % (ref 0.0–0.2)

## 2023-05-15 NOTE — Progress Notes (Signed)
[facility] [address], [place] [address]    Clinic Day:  05/15/2023  Referring physician: Mechele Claude, MD  Patient Care Team: Shelby Claude, MD as PCP - General (Family Medicine) Shelby Massed, MD as Consulting Physician (Hematology) [facility] [place], MD as Attending Physician   ASSESSMENT & PLAN:   Assessment: 1.  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 -Brain MRI on 12/07/2019 with no metastatic disease. -Adjuvant chemotherapy was not offered based on poor performance status and weight loss. -CT chest on 01/08/2020 did not show any evidence of local recurrence left lung.  Trace loculated medial left pleural effusion.  No evidence of metastatic disease in the chest.  Tiny medial right upper lobe 3 mm solid pulmonary nodule decreased in the interval, presumably benign. -CT chest with contrast on 06/21/2020 with no evidence of recurrence or metastatic disease.  3 mm nodule in the medial right chest is unchanged. -CT chest with contrast on 09/09/2020 showed stable postoperative changes from left upper lobectomy with no evidence of recurrence or metastasis.   2.  Normocytic anemia: -Feraheme on 03/25/2020 and 04/01/2020.   3.  Left kidney clear cell renal cell carcinoma: -Left radical nephrectomy on 03/25/2019.  Pathology shows RCC, conventional clear cell type, nuclear grade 2 (5.8 cm).  Tumor invades anterior perinephric fat and segmental renal vein (P T3a), ureteral, vascular normal resection margins are negative.  Plan: 1.  Stage IIb (T1BN1) squamous cell carcinoma of the left upper lobe of the lung: - She reportedly fell and broke her left wrist and had surgery.  She is feeling bad since then. - We reviewed CT chest (10/31/2022): Stable postsurgical changes.  Stable 4 mm right apical lung nodule.  No evidence of recurrence. - Recommend follow-up in 6 months with repeat CT  scan.   2.  Normocytic anemia: - Continue iron tablet with vitamin C.  Hemoglobin is 12.6.  Ferritin is stable at 54. - Will repeat ferritin and iron panel at next visit.  No orders of the defined types were placed in this encounter.     [name],acting as a Neurosurgeon for Shelby Massed, MD.,have documented all relevant documentation on the behalf of Shelby Massed, MD,as directed by  Shelby Massed, MD while in the presence of Shelby Massed, MD.  ***   [name]   9/11/20247:41 PM  CHIEF COMPLAINT:   Diagnosis: squamous cell carcinoma of left lung and IDA    Cancer Staging  Adenocarcinoma of left breast Staging form: Breast, AJCC 7th Edition - Clinical: Stage IIA (T2, N0, cM0) - Signed by Shelby Newer, PA on 04/16/2012  Clear cell renal cell carcinoma, left (HCC) Staging form: Kidney, AJCC 8th Edition - Clinical: Stage III (cT3a, cNX, cM0) - Signed by Shelby Massed, MD on 04/13/2019  Lung cancer [facility]) Staging form: Lung, AJCC 8th Edition - Clinical stage from 10/26/2019: Stage IIB (cT1b, cN1, cM0) - Signed by Shelby Massed, MD on 10/26/2019    Prior Therapy: 1. Left upper lobe lobectomy on 09/11/2019. 2. Intermittent Feraheme last on 04/01/2020.  Current Therapy:  iron tablets daily   HISTORY OF PRESENT ILLNESS:   Oncology History  Clear cell renal cell carcinoma, left (HCC)  04/13/2019 Initial Diagnosis   Clear cell renal cell carcinoma, left (HCC)   04/13/2019 Cancer Staging   Staging form: Kidney, AJCC 8th Edition - Clinical: Stage III (cT3a, cNX, cM0) - Signed  by Shelby Massed, MD on 04/13/2019   Lung cancer (HCC)  09/11/2019 Initial Diagnosis   Lung cancer (HCC)   10/26/2019 Cancer Staging   Staging form: Lung, AJCC 8th Edition - Clinical stage from 10/26/2019: Stage IIB (cT1b, cN1, cM0) - Signed by Shelby Massed, MD on 10/26/2019      INTERVAL HISTORY:   [name] is a 82 y.o. female  presenting to clinic today for follow up of squamous cell carcinoma of left lung and IDA. She was last seen by me on 11/07/22.  Since her last visit, she underwent CT chest on 05/10/23 that found: no new or progressive findings to suggest recurrent or metastatic disease; stable 4 mm right upper lobe pulmonary nodule; and enlargement of the pulmonary outflow tract/main pulmonary arteries suggests pulmonary arterial hypertension.  Today, she states that she is doing well overall. Her appetite level is at ***%. Her energy level is at ***%.   PAST MEDICAL HISTORY:   Past Medical History: Past Medical History:  Diagnosis Date   Adenocarcinoma of left breast 04/16/2012   Stage II (T2 N0 M0) left-sided adenocarcinoma breast diagnosed in 1990 by Dr. Malvin Tyler and treated by Dr. Berton Tyler on NSAB PPD-19 with CMF followed by 5 years of tamoxifen and she remains disease free thus far.    Breast cancer (HCC)    left breast/mastectomy/dx approx1990   COPD (chronic obstructive pulmonary disease) (HCC)    Gall bladder stones    Grave's disease    Headache    Migraines   High triglycerides    History of kidney stones    small stone  rt    Hyperthyroidism    Nerve compression    pinched in neck   Pneumonia 1990   PONV (postoperative nausea and vomiting)    Sepsis due to Escherichia coli with acute renal failure without septic shock [facility])     Surgical History: Past Surgical History:  Procedure Laterality Date   CARPAL TUNNEL RELEASE     rt. hand   cataract surgery     bilateral   CHOLECYSTECTOMY N/A 05/05/2021   Procedure: LAPAROSCOPIC CHOLECYSTECTOMY;  Surgeon: Shelby Bue, MD;  Location: [facility] OR;  Service: General;  Laterality: N/A;   COLONOSCOPY     EYE SURGERY     LAPAROSCOPIC NEPHRECTOMY Left 03/25/2019   Procedure: LAPAROSCOPIC RADICAL NEPHRECTOMY;  Surgeon: Shelby Paci, MD;  Location: [facility] ORS;  Service: Urology;  Laterality: Left;   MASTECTOMY     left.   NEPHRECTOMY  Left    NODE DISSECTION Left 09/11/2019   Procedure: Node Dissection;  Surgeon: Shelby Slot, MD;  Location: [facility] OR;  Service: Thoracic;  Laterality: Left;   THORACOTOMY/LOBECTOMY Left 09/11/2019   Procedure: Thoracotomy/Lobectomy Left Upper Lobe;  Surgeon: Shelby Slot, MD;  Location: [facility] OR;  Service: Thoracic;  Laterality: Left;   TONSILLECTOMY      Social History: Social History   Socioeconomic History   Marital status: Divorced    Spouse name: Not on file   Number of children: 2   Years of education: Not on file   Highest education level: 12th grade  Occupational History   Occupation: retired  Tobacco Use   Smoking status: Former    Current packs/day: 0.00    Average packs/day: 1 pack/day for 60.0 years (60.0 ttl pk-yrs)    Types: Cigarettes    Start date: 09/22/1959    Quit date: 09/22/2019    Years since quitting: 3.6   Smokeless tobacco: Never  Tobacco comments:    attempted to stop but its expensive to quit  Vaping Use   Vaping status: Never Used  Substance and Sexual Activity   Alcohol use: No   Drug use: No   Sexual activity: Not on file  Other Topics Concern   Not on file  Social History Narrative   Lives alone - has a basement but doesn't use it   Daughter Zettie Cooley checks on her daily   Daughter Vernetta Honey also helps a lot   Social Determinants of Health   Financial Resource Strain: Low Risk  (12/05/2022)   Overall Financial Resource Strain (CARDIA)    Difficulty of Paying Living Expenses: Not very hard  Food Insecurity: No Food Insecurity (12/05/2022)   Hunger Vital Sign    Worried About Running Out of Food in the Last Year: Never true    Ran Out of Food in the Last Year: Never true  Transportation Needs: No Transportation Needs (12/05/2022)   PRAPARE - Administrator, Civil Service (Medical): No    Lack of Transportation (Non-Medical): No  Physical Activity: Unknown (12/05/2022)   Exercise Vital Sign    Days of Exercise  per Week: 0 days    Minutes of Exercise per Session: Not on file  Stress: No Stress Concern Present (12/05/2022)   [employer] of Occupational Health - Occupational Stress Questionnaire    Feeling of Stress : Not at all  Social Connections: Unknown (12/05/2022)   Social Connection and Isolation Panel [NHANES]    Frequency of Communication with Friends and Family: Twice a week    Frequency of Social Gatherings with Friends and Family: Patient declined    Attends Religious Services: 1 to 4 times per year    Active Member of [employer] or Organizations: Yes    Attends Banker Meetings: Patient declined    Marital Status: Divorced  Catering manager Violence: Not At Risk (09/29/2021)   Humiliation, Afraid, Rape, and Kick questionnaire    Fear of Current or Ex-Partner: No    Emotionally Abused: No    Physically Abused: No    Sexually Abused: No    Family History: Family History  Problem Relation Age of Onset   Cancer Father        lung cancer   Cancer Brother        colon cancer   Cancer Other        lung cancer/died age 62    Current Medications:  Current Outpatient Medications:    acetaminophen (TYLENOL) 500 MG tablet, Take 500 mg by mouth every 8 (eight) hours as needed for moderate pain., Disp: , Rfl:    atorvastatin (LIPITOR) 20 MG tablet, TAKE 1 TABLET EVERY DAY FOR CHOLESTEROL, Disp: 90 tablet, Rfl: 0   Calcium-Vitamin D ([facility] CALCIUM-600/VIT D PO), Take 1 capsule by mouth in the morning and at bedtime., Disp: , Rfl:    ferrous sulfate 325 (65 FE) MG EC tablet, Take 325 mg by mouth daily., Disp: , Rfl:    HYDROcodone-acetaminophen (NORCO/VICODIN) 5-325 MG tablet, Take 1 tablet by mouth every 6 (six) hours as needed., Disp: , Rfl:    hydroxypropyl methylcellulose / hypromellose (ISOPTO TEARS / GONIOVISC) 2.5 % ophthalmic solution, Place 1 drop into both eyes 3 (three) times daily as needed for dry eyes. , Disp: , Rfl:    levothyroxine (SYNTHROID) 100 MCG tablet, TAKE  1 TABLET EVERY DAY BEFORE BREAKFAST, Disp: 90 tablet, Rfl: 1   meclizine (ANTIVERT) 25  MG tablet, Take 1 tablet (25 mg total) by mouth daily as needed for dizziness or nausea., Disp: 30 tablet, Rfl: 5   Multiple Vitamins-Minerals (CENTRUM SILVER ULTRA WOMENS) TABS, Take 1 tablet by mouth daily., Disp: , Rfl:    Allergies: Allergies  Allergen Reactions   Dicyclomine Hcl Itching   Chantix [Varenicline]     depression    REVIEW OF SYSTEMS:   Review of Systems  Constitutional:  Negative for chills, fatigue and fever.  HENT:   Negative for lump/mass, mouth sores, nosebleeds, sore throat and trouble swallowing.   Eyes:  Negative for eye problems.  Respiratory:  Negative for cough and shortness of breath.   Cardiovascular:  Negative for chest pain, leg swelling and palpitations.  Gastrointestinal:  Negative for abdominal pain, constipation, diarrhea, nausea and vomiting.  Genitourinary:  Negative for bladder incontinence, difficulty urinating, dysuria, frequency, hematuria and nocturia.   Musculoskeletal:  Negative for arthralgias, back pain, flank pain, myalgias and neck pain.  Skin:  Negative for itching and rash.  Neurological:  Negative for dizziness, headaches and numbness.  Hematological:  Does not bruise/bleed easily.  Psychiatric/Behavioral:  Negative for depression, sleep disturbance and suicidal ideas. The patient is not nervous/anxious.   All other systems reviewed and are negative.    VITALS:   There were no vitals taken for this visit.  Wt Readings from Last 3 Encounters:  04/18/23 138 lb (62.6 kg)  12/06/22 132 lb 3.2 oz (60 kg)  11/07/22 132 lb 3.2 oz (60 kg)    There is no height or weight on file to calculate BMI.  Performance status (ECOG): 1 - Symptomatic but completely ambulatory  PHYSICAL EXAM:   Physical Exam Vitals and nursing note reviewed. Exam conducted with a chaperone present.  Constitutional:      Appearance: Normal appearance.  Cardiovascular:      Rate and Rhythm: Normal rate and regular rhythm.     Pulses: Normal pulses.     Heart sounds: Normal heart sounds.  Pulmonary:     Effort: Pulmonary effort is normal.     Breath sounds: Normal breath sounds.  Abdominal:     Palpations: Abdomen is soft. There is no hepatomegaly, splenomegaly or mass.     Tenderness: There is no abdominal tenderness.  Musculoskeletal:     Right lower leg: No edema.     Left lower leg: No edema.  Lymphadenopathy:     Cervical: No cervical adenopathy.     Right cervical: No superficial, deep or posterior cervical adenopathy.    Left cervical: No superficial, deep or posterior cervical adenopathy.     Upper Body:     Right upper body: No supraclavicular or axillary adenopathy.     Left upper body: No supraclavicular or axillary adenopathy.  Neurological:     General: No focal deficit present.     Mental Status: She is alert and oriented to person, place, and time.  Psychiatric:        Mood and Affect: Mood normal.        Behavior: Behavior normal.     LABS:      Latest Ref Rng & Units 05/10/2023   10:13 AM 04/18/2023    1:56 PM 12/06/2022   12:43 PM  CBC  WBC 4.0 - 10.5 K/uL 5.5  6.4  7.4   Hemoglobin 12.0 - 15.0 g/dL 47.8  29.5  62.1   Hematocrit 36.0 - 46.0 % 42.6  39.5  41.1   Platelets 150 - 400  K/uL 234  239  345       Latest Ref Rng & Units 05/10/2023   10:13 AM 04/18/2023    1:56 PM 12/06/2022   12:43 PM  CMP  Glucose 70 - 99 mg/dL 94  811  914   BUN 8 - 23 mg/dL 21  14  24    Creatinine 0.44 - 1.00 mg/dL 7.82  9.56  2.13   Sodium 135 - 145 mmol/L 143  144  143   Potassium 3.5 - 5.1 mmol/L 4.2  5.0  4.4   Chloride 98 - 111 mmol/L 106  105  103   CO2 22 - 32 mmol/L 27  23  25    Calcium 8.9 - 10.3 mg/dL 9.8  08.6  9.9   Total Protein 6.5 - 8.1 g/dL 8.1  7.2  7.4   Total Bilirubin 0.3 - 1.2 mg/dL 0.7  0.4  0.4   Alkaline Phos 38 - 126 U/L 78  97  143   AST 15 - 41 U/L 20  24  14    ALT 0 - 44 U/L 19  16  11       Lab Results   Component Value Date   CEA 20.0 (H) 04/13/2016   /  CEA  Date Value Ref Range Status  04/13/2016 20.0 (H) 0.0 - 4.7 ng/mL Final    Comment:    (NOTE)       Roche ECLIA methodology       Nonsmokers  <3.9                                     Smokers     <5.6 Performed At: [facility] [address] [place], [place] [id number] Mila Homer MD [id number]    No results found for: "PSA1" No results found for: "CAN199" No results found for: "CAN125"  No results found for: "TOTALPROTELP", "ALBUMINELP", "A1GS", "A2GS", "BETS", "BETA2SER", "GAMS", "MSPIKE", "SPEI" Lab Results  Component Value Date   TIBC 313 10/31/2022   TIBC 295 05/21/2022   TIBC 348 04/26/2022   FERRITIN 54 10/31/2022   FERRITIN 54 05/21/2022   FERRITIN 31 04/26/2022   IRONPCTSAT 21 10/31/2022   IRONPCTSAT 29 05/21/2022   IRONPCTSAT 23 04/26/2022   Lab Results  Component Value Date   LDH 146 07/08/2019   LDH 127 01/29/2019     STUDIES:   CT Chest Wo Contrast  Result Date: 05/15/2023 CLINICAL DATA:  Squamous cell carcinoma of the left lung. Status post surgery. Restaging. * Tracking Code: BO * EXAM: CT CHEST WITHOUT CONTRAST TECHNIQUE: Multidetector CT imaging of the chest was performed following the standard protocol without IV contrast. RADIATION DOSE REDUCTION: This exam was performed according to the departmental dose-optimization program which includes automated exposure control, adjustment of the mA and/or kV according to patient size and/or use of iterative reconstruction technique. COMPARISON:  10/31/2022 FINDINGS: Cardiovascular: The heart size is normal. No substantial pericardial effusion. Coronary artery calcification is evident. Moderate atherosclerotic calcification is noted in the wall of the thoracic aorta. Enlargement of the pulmonary outflow tract/main pulmonary arteries suggests pulmonary arterial hypertension. Mediastinum/Nodes: No mediastinal lymphadenopathy. No evidence for  gross hilar lymphadenopathy although assessment is limited by the lack of intravenous contrast on the current study. The esophagus has normal imaging features. There is no axillary lymphadenopathy. Lungs/Pleura: Centrilobular and paraseptal emphysema evident. Volume loss left hemithorax compatible with the reported history of  left upper lobectomy. 4 mm right upper lobe pulmonary nodule stable on image 28/2. No new suspicious pulmonary nodule or mass. No focal airspace consolidation. No pleural effusion. Upper Abdomen: Thickening of the left adrenal gland is stable without nodule or mass. Left kidney surgically absent. Musculoskeletal: No worrisome lytic or sclerotic osseous abnormality. IMPRESSION: 1. Stable exam. No new or progressive findings to suggest recurrent or metastatic disease. 2. Stable 4 mm right upper lobe pulmonary nodule. 3. Enlargement of the pulmonary outflow tract/main pulmonary arteries suggests pulmonary arterial hypertension. 4. Aortic Atherosclerosis (ICD10-I70.0) and Emphysema (ICD10-J43.9). Electronically Signed   By: Kennith Center M.D.   On: 05/15/2023 13:03

## 2023-05-16 ENCOUNTER — Inpatient Hospital Stay: Payer: Medicare PPO | Admitting: Hematology

## 2023-05-16 VITALS — BP 144/71 | HR 80 | Temp 97.7°F | Resp 16 | Wt 139.4 lb

## 2023-05-16 DIAGNOSIS — Z85528 Personal history of other malignant neoplasm of kidney: Secondary | ICD-10-CM | POA: Diagnosis not present

## 2023-05-16 DIAGNOSIS — Z853 Personal history of malignant neoplasm of breast: Secondary | ICD-10-CM | POA: Diagnosis not present

## 2023-05-16 DIAGNOSIS — Z800 Family history of malignant neoplasm of digestive organs: Secondary | ICD-10-CM | POA: Diagnosis not present

## 2023-05-16 DIAGNOSIS — D649 Anemia, unspecified: Secondary | ICD-10-CM | POA: Diagnosis not present

## 2023-05-16 DIAGNOSIS — C3492 Malignant neoplasm of unspecified part of left bronchus or lung: Secondary | ICD-10-CM | POA: Diagnosis not present

## 2023-05-16 DIAGNOSIS — Z9012 Acquired absence of left breast and nipple: Secondary | ICD-10-CM | POA: Diagnosis not present

## 2023-05-16 DIAGNOSIS — Z85118 Personal history of other malignant neoplasm of bronchus and lung: Secondary | ICD-10-CM | POA: Diagnosis not present

## 2023-05-16 DIAGNOSIS — D509 Iron deficiency anemia, unspecified: Secondary | ICD-10-CM | POA: Diagnosis not present

## 2023-05-16 DIAGNOSIS — Z87891 Personal history of nicotine dependence: Secondary | ICD-10-CM | POA: Diagnosis not present

## 2023-05-16 DIAGNOSIS — Z902 Acquired absence of lung [part of]: Secondary | ICD-10-CM | POA: Diagnosis not present

## 2023-05-16 DIAGNOSIS — Z905 Acquired absence of kidney: Secondary | ICD-10-CM | POA: Diagnosis not present

## 2023-05-16 NOTE — Patient Instructions (Signed)
[facility] at [facility] Discharge Instructions   You were seen and examined today by Dr. Ellin Saba.  He reviewed the results of your lab work which are normal/stable.   He reviewed the results of your CT scan. There is no evidence of cancer on this exam.   Continue taking iron pills.   We will see you back in 6 months. We will repeat lab work and a scan prior to this visit.   Return as scheduled.    Thank you for choosing [facility] at [facility] to provide your oncology and hematology care.  To afford each patient quality time with our provider, please arrive at least 15 minutes before your scheduled appointment time.   If you have a lab appointment with the Cancer Center please come in thru the Main Entrance and check in at the main information desk.  You need to re-schedule your appointment should you arrive 10 or more minutes late.  We strive to give you quality time with our providers, and arriving late affects you and other patients whose appointments are after yours.  Also, if you no show three or more times for appointments you may be dismissed from the clinic at the providers discretion.     Again, thank you for choosing [facility].  Our hope is that these requests will decrease the amount of time that you wait before being seen by our physicians.       _____________________________________________________________  Should you have questions after your visit to [facility], please contact our office at [phone] and follow the prompts.  Our office hours are 8:00 a.m. and 4:30 p.m. Monday - Friday.  Please note that voicemails left after 4:00 p.m. may not be returned until the following business day.  We are closed weekends and major holidays.  You do have access to a nurse 24-7, just call the main number to the clinic [phone] and do not press any options, hold on the line and a nurse will answer  the phone.    For prescription refill requests, have your pharmacy contact our office and allow 72 hours.    Due to Covid, you will need to wear a mask upon entering the hospital. If you do not have a mask, a mask will be given to you at the Main Entrance upon arrival. For doctor visits, patients may have 1 support person age 67 or older with them. For treatment visits, patients can not have anyone with them due to social distancing guidelines and our immunocompromised population.

## 2023-05-24 ENCOUNTER — Encounter (HOSPITAL_COMMUNITY): Payer: Self-pay

## 2023-05-24 ENCOUNTER — Ambulatory Visit (HOSPITAL_COMMUNITY)
Admission: RE | Admit: 2023-05-24 | Discharge: 2023-05-24 | Disposition: A | Discharge status: Home / Self Care | Payer: Medicare PPO | Source: Ambulatory Visit | Attending: Family Medicine | Admitting: Family Medicine

## 2023-05-24 DIAGNOSIS — Z1231 Encounter for screening mammogram for malignant neoplasm of breast: Secondary | ICD-10-CM | POA: Diagnosis not present

## 2023-06-05 ENCOUNTER — Other Ambulatory Visit: Payer: Self-pay | Admitting: Family Medicine

## 2023-06-16 ENCOUNTER — Other Ambulatory Visit: Payer: Self-pay | Admitting: Family Medicine

## 2023-07-08 ENCOUNTER — Telehealth: Payer: Self-pay | Admitting: Family Medicine

## 2023-07-08 MED ORDER — MECLIZINE HCL 25 MG PO TABS: 25.0000 mg | ORAL_TABLET | Freq: Every day | ORAL | 2 refills | Status: DC | PRN

## 2023-07-08 NOTE — Telephone Encounter (Signed)
#  30 w/ 5 [facility], remaining refills for #30 w/ 2 refills will be sent LMOVM this has been done.

## 2023-08-29 ENCOUNTER — Other Ambulatory Visit: Payer: Self-pay | Admitting: Family Medicine

## 2023-10-24 ENCOUNTER — Ambulatory Visit: Payer: Medicare PPO | Admitting: Family Medicine

## 2023-11-01 ENCOUNTER — Other Ambulatory Visit: Payer: Self-pay | Admitting: Family Medicine

## 2023-11-01 NOTE — Telephone Encounter (Signed)
 Copied from CRM [id number]. Topic: Clinical - Medication Refill >> Nov 01, 2023  9:37 AM Maxwell Marion wrote: Most Recent Primary Care Visit:  Provider: Mechele Claude  Department: [facility]  Visit Type: OFFICE VISIT  Date: 04/18/2023  Medication: meclizine (ANTIVERT) 25 MG tablet  Has the patient contacted their pharmacy? Yes (Agent: If no, request that the patient contact the pharmacy for the refill. If patient does not wish to contact the pharmacy document the reason why and proceed with request.) (Agent: If yes, when and what did the pharmacy advise?)  Is this the correct pharmacy for this prescription? Yes If no, delete pharmacy and type the correct one.  This is the patient's preferred pharmacy:  [facility]  [address] [place], [place] [address]  Has the prescription been filled recently? No  Is the patient out of the medication? No  Has the patient been seen for an appointment in the last year OR does the patient have an upcoming appointment? Yes  Can we respond through MyChart? Yes  Agent: Please be advised that Rx refills may take up to 3 business days. We ask that you follow-up with your pharmacy.

## 2023-11-02 MED ORDER — MECLIZINE HCL 25 MG PO TABS: 25.0000 mg | ORAL_TABLET | Freq: Every day | ORAL | 2 refills | Status: DC | PRN

## 2023-11-10 ENCOUNTER — Other Ambulatory Visit: Payer: Self-pay | Admitting: Family Medicine

## 2023-11-15 ENCOUNTER — Ambulatory Visit (HOSPITAL_COMMUNITY)
Admission: RE | Admit: 2023-11-15 | Discharge: 2023-11-15 | Disposition: A | Discharge status: Home / Self Care | Payer: Medicare PPO | Source: Ambulatory Visit | Attending: Hematology | Admitting: Hematology

## 2023-11-15 ENCOUNTER — Inpatient Hospital Stay: Payer: Medicare PPO | Attending: Hematology

## 2023-11-15 DIAGNOSIS — Z85528 Personal history of other malignant neoplasm of kidney: Secondary | ICD-10-CM | POA: Diagnosis not present

## 2023-11-15 DIAGNOSIS — Z853 Personal history of malignant neoplasm of breast: Secondary | ICD-10-CM | POA: Insufficient documentation

## 2023-11-15 DIAGNOSIS — Z85118 Personal history of other malignant neoplasm of bronchus and lung: Secondary | ICD-10-CM | POA: Diagnosis not present

## 2023-11-15 DIAGNOSIS — Z87891 Personal history of nicotine dependence: Secondary | ICD-10-CM | POA: Diagnosis not present

## 2023-11-15 DIAGNOSIS — I700 Atherosclerosis of aorta: Secondary | ICD-10-CM | POA: Diagnosis not present

## 2023-11-15 DIAGNOSIS — Z800 Family history of malignant neoplasm of digestive organs: Secondary | ICD-10-CM | POA: Insufficient documentation

## 2023-11-15 DIAGNOSIS — C3492 Malignant neoplasm of unspecified part of left bronchus or lung: Secondary | ICD-10-CM | POA: Insufficient documentation

## 2023-11-15 DIAGNOSIS — Z902 Acquired absence of lung [part of]: Secondary | ICD-10-CM | POA: Diagnosis not present

## 2023-11-15 DIAGNOSIS — D649 Anemia, unspecified: Secondary | ICD-10-CM | POA: Diagnosis not present

## 2023-11-15 DIAGNOSIS — C3490 Malignant neoplasm of unspecified part of unspecified bronchus or lung: Secondary | ICD-10-CM | POA: Diagnosis not present

## 2023-11-15 DIAGNOSIS — Z801 Family history of malignant neoplasm of trachea, bronchus and lung: Secondary | ICD-10-CM | POA: Insufficient documentation

## 2023-11-15 DIAGNOSIS — Z905 Acquired absence of kidney: Secondary | ICD-10-CM | POA: Insufficient documentation

## 2023-11-15 DIAGNOSIS — J432 Centrilobular emphysema: Secondary | ICD-10-CM | POA: Diagnosis not present

## 2023-11-15 DIAGNOSIS — D509 Iron deficiency anemia, unspecified: Secondary | ICD-10-CM

## 2023-11-15 DIAGNOSIS — Z9012 Acquired absence of left breast and nipple: Secondary | ICD-10-CM | POA: Insufficient documentation

## 2023-11-15 LAB — CBC WITH DIFFERENTIAL/PLATELET
Abs Immature Granulocytes: 0.02 10*3/uL (ref 0.00–0.07)
Basophils Absolute: 0 10*3/uL (ref 0.0–0.1)
Basophils Relative: 1 %
Eosinophils Absolute: 0.3 10*3/uL (ref 0.0–0.5)
Eosinophils Relative: 4 %
HCT: 44.7 % (ref 36.0–46.0)
Hemoglobin: 14.4 g/dL (ref 12.0–15.0)
Immature Granulocytes: 0 %
Lymphocytes Relative: 24 %
Lymphs Abs: 1.5 10*3/uL (ref 0.7–4.0)
MCH: 30.9 pg (ref 26.0–34.0)
MCHC: 32.2 g/dL (ref 30.0–36.0)
MCV: 95.9 fL (ref 80.0–100.0)
Monocytes Absolute: 0.4 10*3/uL (ref 0.1–1.0)
Monocytes Relative: 6 %
Neutro Abs: 4.1 10*3/uL (ref 1.7–7.7)
Neutrophils Relative %: 65 %
Platelets: 252 10*3/uL (ref 150–400)
RBC: 4.66 MIL/uL (ref 3.87–5.11)
RDW: 13 % (ref 11.5–15.5)
WBC: 6.3 10*3/uL (ref 4.0–10.5)
nRBC: 0 % (ref 0.0–0.2)

## 2023-11-15 LAB — COMPREHENSIVE METABOLIC PANEL
ALT: 24 U/L (ref 0–44)
AST: 26 U/L (ref 15–41)
Albumin: 4.5 g/dL (ref 3.5–5.0)
Alkaline Phosphatase: 84 U/L (ref 38–126)
Anion gap: 13 (ref 5–15)
BUN: 21 mg/dL (ref 8–23)
CO2: 25 mmol/L (ref 22–32)
Calcium: 10 mg/dL (ref 8.9–10.3)
Chloride: 104 mmol/L (ref 98–111)
Creatinine, Ser: 1.29 mg/dL — ABNORMAL HIGH (ref 0.44–1.00)
GFR, Estimated: 42 mL/min — ABNORMAL LOW (ref >60)
Glucose, Bld: 103 mg/dL — ABNORMAL HIGH (ref 70–99)
Potassium: 4.4 mmol/L (ref 3.5–5.1)
Sodium: 142 mmol/L (ref 135–145)
Total Bilirubin: 0.8 mg/dL (ref 0.0–1.2)
Total Protein: 8.2 g/dL — ABNORMAL HIGH (ref 6.5–8.1)

## 2023-11-15 LAB — IRON AND TIBC
Iron: 94 ug/dL (ref 28–170)
Saturation Ratios: 27 % (ref 10.4–31.8)
TIBC: 345 ug/dL (ref 250–450)
UIBC: 251 ug/dL

## 2023-11-15 LAB — FERRITIN: Ferritin: 37 ng/mL (ref 11–307)

## 2023-11-21 ENCOUNTER — Inpatient Hospital Stay: Payer: Medicare PPO | Admitting: Hematology

## 2023-11-21 ENCOUNTER — Ambulatory Visit: Payer: Medicare PPO | Admitting: Hematology

## 2023-11-21 VITALS — BP 147/72 | HR 79 | Temp 97.9°F | Resp 16 | Wt 145.9 lb

## 2023-11-21 DIAGNOSIS — Z85118 Personal history of other malignant neoplasm of bronchus and lung: Secondary | ICD-10-CM | POA: Diagnosis not present

## 2023-11-21 DIAGNOSIS — C3492 Malignant neoplasm of unspecified part of left bronchus or lung: Secondary | ICD-10-CM

## 2023-11-21 DIAGNOSIS — Z853 Personal history of malignant neoplasm of breast: Secondary | ICD-10-CM | POA: Diagnosis not present

## 2023-11-21 DIAGNOSIS — Z85528 Personal history of other malignant neoplasm of kidney: Secondary | ICD-10-CM | POA: Diagnosis not present

## 2023-11-21 DIAGNOSIS — Z905 Acquired absence of kidney: Secondary | ICD-10-CM | POA: Diagnosis not present

## 2023-11-21 DIAGNOSIS — Z800 Family history of malignant neoplasm of digestive organs: Secondary | ICD-10-CM | POA: Diagnosis not present

## 2023-11-21 DIAGNOSIS — Z87891 Personal history of nicotine dependence: Secondary | ICD-10-CM | POA: Diagnosis not present

## 2023-11-21 DIAGNOSIS — D509 Iron deficiency anemia, unspecified: Secondary | ICD-10-CM | POA: Diagnosis not present

## 2023-11-21 DIAGNOSIS — Z9012 Acquired absence of left breast and nipple: Secondary | ICD-10-CM | POA: Diagnosis not present

## 2023-11-21 DIAGNOSIS — Z902 Acquired absence of lung [part of]: Secondary | ICD-10-CM | POA: Diagnosis not present

## 2023-11-21 DIAGNOSIS — D649 Anemia, unspecified: Secondary | ICD-10-CM | POA: Diagnosis not present

## 2023-11-21 NOTE — Patient Instructions (Addendum)
 [name] at [facility] Discharge Instructions   You were seen and examined today by Dr. Ellin Saba.  He reviewed the results of your lab work which are mostly normal/stable. Your iron is low. We will arrange for you to have an iron infusion.   He reviewed the results of your CT scan which did not show any evidence of cancer.   We will see you back in 6 months. We will repeat lab work and a CT scan prior to this visit.    Return as scheduled.    Thank you for choosing [facility] at [facility] to provide your oncology and hematology care.  To afford each patient quality time with our provider, please arrive at least 15 minutes before your scheduled appointment time.   If you have a lab appointment with the [facility] please come in thru the Main Entrance and check in at the main information desk.  You need to re-schedule your appointment should you arrive 10 or more minutes late.  We strive to give you quality time with our providers, and arriving late affects you and other patients whose appointments are after yours.  Also, if you no show three or more times for appointments you may be dismissed from the clinic at the providers discretion.     Again, thank you for choosing [facility]  Our hope is that these requests will decrease the amount of time that you wait before being seen by our physicians.       _____________________________________________________________  Should you have questions after your visit to [facility], please contact our office at [phone] and follow the prompts.  Our office hours are 8:00 a.m. and 4:30 p.m. Monday - Friday.  Please note that voicemails left after 4:00 p.m. may not be returned until the following business day.  We are closed weekends and major holidays.  You do have access to a nurse 24-7, just call the main number to the clinic [phone] and do not press any options,  hold on the line and a nurse will answer the phone.    For prescription refill requests, have your pharmacy contact our office and allow 72 hours.    Due to Covid, you will need to wear a mask upon entering the hospital. If you do not have a mask, a mask will be given to you at the Main Entrance upon arrival. For doctor visits, patients may have 1 support person age 54 or older with them. For treatment visits, patients can not have anyone with them due to social distancing guidelines and our immunocompromised population.

## 2023-11-21 NOTE — Progress Notes (Signed)
 [facility] [address] [place] [address]    Clinic Day:  11/21/23   Referring physician: Mechele Claude, MD  Patient Care Team: Mechele Claude, MD as PCP - General (Family Medicine) Doreatha Massed, MD as Consulting Physician (Hematology) [facility], [place], MD as Attending Physician   ASSESSMENT & PLAN:   Assessment: 1.  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 -Brain MRI on 12/07/2019 with no metastatic disease. -Adjuvant chemotherapy was not offered based on poor performance status and weight loss. -CT chest on 01/08/2020 did not show any evidence of local recurrence left lung.  Trace loculated medial left pleural effusion.  No evidence of metastatic disease in the chest.  Tiny medial right upper lobe 3 mm solid pulmonary nodule decreased in the interval, presumably benign. -CT chest with contrast on 06/21/2020 with no evidence of recurrence or metastatic disease.  3 mm nodule in the medial right chest is unchanged. -CT chest with contrast on 09/09/2020 showed stable postoperative changes from left upper lobectomy with no evidence of recurrence or metastasis.   2.  Normocytic anemia: -Feraheme on 03/25/2020 and 04/01/2020.   3.  Left kidney clear cell renal cell carcinoma: -Left radical nephrectomy on 03/25/2019.  Pathology shows RCC, conventional clear cell type, nuclear grade 2 (5.8 cm).  Tumor invades anterior perinephric fat and segmental renal vein (P T3a), ureteral, vascular normal resection margins are negative.  Plan: 1.  Stage IIb (T1BN1) squamous cell carcinoma of the left upper lobe of the lung: - Does not report any chest pain or hemoptysis.  No new cough reported.  She has not fallen since last visit.  Uses cane to ambulate. - Labs from 11/15/2023: Creatinine 1.29 stable.  LFTs are normal.  CBC is normal. - CT chest without contrast on 11/15/2023: No evidence of  recurrence or metastatic disease. - Recommend follow-up in 6 months with repeat CT of the chest.   2.  Normocytic anemia: - She is taking iron tablet with vitamin C daily.  However ferritin dropped to 37 with normal hemoglobin 14.4.  She reports decrease in energy.  Recommend parenteral iron therapy with INFeD 1 g x 1.  Will repeat ferritin and iron panel in 6 months.  Orders Placed This Encounter  Procedures   CT CHEST WO CONTRAST    Standing Status:   Future    Expected Date:   05/23/2024    Expiration Date:   11/20/2024    Preferred imaging location?:   [facility]   CBC with Differential    Standing Status:   Future    Expected Date:   05/18/2024    Expiration Date:   11/20/2024   Comprehensive metabolic panel    Standing Status:   Future    Expected Date:   05/18/2024    Expiration Date:   11/20/2024   Iron and TIBC ([facility] ONLY)    Standing Status:   Future    Expected Date:   05/18/2024    Expiration Date:   11/20/2024   Ferritin    Standing Status:   Future    Expected Date:   05/18/2024    Expiration Date:   11/20/2024      Mikeal Hawthorne R Teague,acting as a scribe for Doreatha Massed, MD.,have documented all relevant documentation on the behalf of Doreatha Massed, MD,as directed by  Doreatha Massed, MD while in the presence of Doreatha Massed, MD.  I, Doreatha Massed MD, have reviewed the above documentation for accuracy and completeness, and I agree with the above.     Doreatha Massed, MD   3/20/202511:45 AM  CHIEF COMPLAINT:   Diagnosis: squamous cell carcinoma of left lung and IDA    Cancer Staging  Adenocarcinoma of left breast Staging form: Breast, AJCC 7th Edition - Clinical: Stage IIA (T2, N0, cM0) - Signed by Ellouise Newer, PA on 04/16/2012  Clear cell renal cell carcinoma, left (HCC) Staging form: Kidney, AJCC 8th Edition - Clinical: Stage III (cT3a, cNX, cM0) - Signed by Doreatha Massed, MD on  04/13/2019  Lung cancer [facility]) Staging form: Lung, AJCC 8th Edition - Clinical stage from 10/26/2019: Stage IIB (cT1b, cN1, cM0) - Signed by Doreatha Massed, MD on 10/26/2019    Prior Therapy: 1. Left upper lobe lobectomy on 09/11/2019. 2. Intermittent Feraheme last on 04/01/2020.  Current Therapy:  iron tablets daily   HISTORY OF PRESENT ILLNESS:   Oncology History  Clear cell renal cell carcinoma, left (HCC)  04/13/2019 Initial Diagnosis   Clear cell renal cell carcinoma, left (HCC)   04/13/2019 Cancer Staging   Staging form: Kidney, AJCC 8th Edition - Clinical: Stage III (cT3a, cNX, cM0) - Signed by Doreatha Massed, MD on 04/13/2019   Lung cancer (HCC)  09/11/2019 Initial Diagnosis   Lung cancer (HCC)   10/26/2019 Cancer Staging   Staging form: Lung, AJCC 8th Edition - Clinical stage from 10/26/2019: Stage IIB (cT1b, cN1, cM0) - Signed by Doreatha Massed, MD on 10/26/2019      INTERVAL HISTORY:   Tuesday is a 83 y.o. female presenting to clinic today for follow up of squamous cell carcinoma of left lung and IDA. She was last seen by me on 05/16/23.  Since her last visit, she underwent CT chest on 11/15/23 that found: No evidence of recurrent or metastatic disease. Aortic atherosclerosis. Coronary artery calcification. Enlarged pulmonic trunk, indicative of pulmonary arterial hypertension. Emphysema.  Today, she states that she is doing well overall. Her appetite level is at 100%. Her energy level is at 80%. Modesta is accompanied by her daughter. She is taking iron supplements daily, though she still reports decreased energy levels. She denies any BRBPR or melena. Sherrill denies any recent falls.   PAST MEDICAL HISTORY:   Past Medical History: Past Medical History:  Diagnosis Date   Adenocarcinoma of left breast 04/16/2012   Stage II (T2 N0 M0) left-sided adenocarcinoma breast diagnosed in 1990 by Dr. Malvin Johns and treated by Dr. Berton Mount on NSAB PPD-19 with CMF  followed by 5 years of tamoxifen and she remains disease free thus far.    Breast cancer (HCC)    left breast/mastectomy/dx approx1990   COPD (chronic obstructive pulmonary disease) (HCC)    Gall bladder stones    Grave's disease    Headache    Migraines   High triglycerides    History of kidney stones    small stone  rt    Hyperthyroidism    Nerve compression    pinched in neck   Pneumonia 1990   PONV (postoperative nausea and vomiting)    Sepsis due to Escherichia coli with acute renal failure without septic shock [facility])     Surgical History: Past Surgical History:  Procedure Laterality Date   CARPAL TUNNEL RELEASE     rt. hand   cataract surgery     bilateral   CHOLECYSTECTOMY N/A 05/05/2021   Procedure: LAPAROSCOPIC CHOLECYSTECTOMY;  Surgeon: Berna Bue, MD;  Location: [facility] OR;  Service: General;  Laterality: N/A;   COLONOSCOPY     EYE SURGERY     LAPAROSCOPIC NEPHRECTOMY Left 03/25/2019   Procedure: LAPAROSCOPIC RADICAL NEPHRECTOMY;  Surgeon: Rene Paci, MD;  Location: [facility] ORS;  Service: Urology;  Laterality: Left;   MASTECTOMY     left.   NEPHRECTOMY Left    NODE DISSECTION Left 09/11/2019   Procedure: Node Dissection;  Surgeon: Loreli Slot, MD;  Location: [facility] OR;  Service: Thoracic;  Laterality: Left;   THORACOTOMY/LOBECTOMY Left 09/11/2019   Procedure: Thoracotomy/Lobectomy Left Upper Lobe;  Surgeon: Loreli Slot, MD;  Location: [facility] OR;  Service: Thoracic;  Laterality: Left;   TONSILLECTOMY      Social History: Social History   Socioeconomic History   Marital status: Divorced    Spouse name: Not on file   Number of children: 2   Years of education: Not on file   Highest education level: 12th grade  Occupational History   Occupation: retired  Tobacco Use   Smoking status: Former    Current packs/day: 0.00    Average packs/day: 1 pack/day for 60.0 years (60.0 ttl pk-yrs)    Types: Cigarettes    Start date: 09/22/1959     Quit date: 09/22/2019    Years since quitting: 4.1   Smokeless tobacco: Never   Tobacco comments:    attempted to stop but its expensive to quit  Vaping Use   Vaping status: Never Used  Substance and Sexual Activity   Alcohol use: No   Drug use: No   Sexual activity: Not on file  Other Topics Concern   Not on file  Social History Narrative   Lives alone - has a basement but doesn't use it   Daughter Zettie Cooley checks on her daily   Daughter Vernetta Honey also helps a lot   Social Drivers of Health   Financial Resource Strain: Low Risk  (12/05/2022)   Overall Financial Resource Strain (CARDIA)    Difficulty of Paying Living Expenses: Not very hard  Food Insecurity: No Food Insecurity (12/05/2022)   Hunger Vital Sign    Worried About Running Out of Food in the Last Year: Never true    Ran Out of Food in the Last Year: Never true  Transportation Needs: No Transportation Needs (12/05/2022)   PRAPARE - Administrator, Civil Service (Medical): No    Lack of Transportation (Non-Medical): No  Physical Activity: Unknown (12/05/2022)   Exercise Vital Sign    Days of Exercise per Week: 0 days    Minutes of Exercise per Session: Not on file  Stress: No Stress Concern Present (12/05/2022)   [employer] of Occupational Health - Occupational Stress Questionnaire    Feeling of Stress : Not at all  Social Connections: Unknown (12/05/2022)   Social Connection and Isolation Panel [NHANES]    Frequency of Communication with Friends and Family: Twice a week    Frequency of Social Gatherings with Friends and Family: Patient declined    Attends Religious Services: 1 to 4 times per year    Active Member of [employer] or Organizations: Yes    Attends Banker Meetings: Patient declined    Marital Status: Divorced  Catering manager Violence: Not At Risk (09/29/2021)   Humiliation, Afraid, Rape, and Kick questionnaire    Fear of Current or Ex-Partner: No    Emotionally Abused: No     Physically Abused: No    Sexually Abused: No  Family History: Family History  Problem Relation Age of Onset   Cancer Father        lung cancer   Cancer Brother        colon cancer   Cancer Other        lung cancer/died age 29    Current Medications:  Current Outpatient Medications:    acetaminophen (TYLENOL) 500 MG tablet, Take 500 mg by mouth every 8 (eight) hours as needed for moderate pain., Disp: , Rfl:    atorvastatin (LIPITOR) 20 MG tablet, TAKE 1 TABLET EVERY DAY FOR CHOLESTEROL, Disp: 90 tablet, Rfl: 0   Calcium-Vitamin D ([employer] CALCIUM-600/VIT D PO), Take 1 capsule by mouth in the morning and at bedtime., Disp: , Rfl:    ferrous sulfate 325 (65 FE) MG EC tablet, Take 325 mg by mouth daily., Disp: , Rfl:    HYDROcodone-acetaminophen (NORCO/VICODIN) 5-325 MG tablet, Take 1 tablet by mouth every 6 (six) hours as needed., Disp: , Rfl:    hydroxypropyl methylcellulose / hypromellose (ISOPTO TEARS / GONIOVISC) 2.5 % ophthalmic solution, Place 1 drop into both eyes 3 (three) times daily as needed for dry eyes. , Disp: , Rfl:    levothyroxine (SYNTHROID) 100 MCG tablet, TAKE 1 TABLET EVERY DAY BEFORE BREAKFAST, Disp: 90 tablet, Rfl: 3   meclizine (ANTIVERT) 25 MG tablet, Take 1 tablet (25 mg total) by mouth daily as needed for dizziness or nausea., Disp: 30 tablet, Rfl: 2   Multiple Vitamins-Minerals (CENTRUM SILVER ULTRA WOMENS) TABS, Take 1 tablet by mouth daily., Disp: , Rfl:    Allergies: Allergies  Allergen Reactions   Dicyclomine Hcl Itching   Chantix [Varenicline]     depression    REVIEW OF SYSTEMS:   Review of Systems  Constitutional:  Negative for chills, fatigue and fever.  HENT:   Negative for lump/mass, mouth sores, nosebleeds, sore throat and trouble swallowing.   Eyes:  Negative for eye problems.  Respiratory:  Negative for cough and shortness of breath.   Cardiovascular:  Negative for chest pain, leg swelling and palpitations.  Gastrointestinal:  Positive  for abdominal pain, nausea and vomiting. Negative for constipation and diarrhea.  Genitourinary:  Negative for bladder incontinence, difficulty urinating, dysuria, frequency, hematuria and nocturia.   Musculoskeletal:  Negative for arthralgias, back pain, flank pain, myalgias and neck pain.  Skin:  Negative for itching and rash.  Neurological:  Positive for dizziness (with vertigo). Negative for headaches and numbness.  Hematological:  Does not bruise/bleed easily.  Psychiatric/Behavioral:  Negative for depression, sleep disturbance and suicidal ideas. The patient is not nervous/anxious.   All other systems reviewed and are negative.    VITALS:   Blood pressure (!) 147/72, pulse 79, temperature 97.9 F (36.6 C), temperature source Oral, resp. rate 16, weight 145 lb 15.1 oz (66.2 kg), SpO2 94%.  Wt Readings from Last 3 Encounters:  11/21/23 145 lb 15.1 oz (66.2 kg)  05/16/23 139 lb 6.4 oz (63.2 kg)  04/18/23 138 lb (62.6 kg)    Body mass index is 23.92 kg/m.  Performance status (ECOG): 1 - Symptomatic but completely ambulatory  PHYSICAL EXAM:   Physical Exam Vitals and nursing note reviewed. Exam conducted with a chaperone present.  Constitutional:      Appearance: Normal appearance.  Cardiovascular:     Rate and Rhythm: Normal rate and regular rhythm.     Pulses: Normal pulses.     Heart sounds: Normal heart sounds.  Pulmonary:     Effort: Pulmonary effort  is normal.     Breath sounds: Normal breath sounds.  Abdominal:     Palpations: Abdomen is soft. There is no hepatomegaly, splenomegaly or mass.     Tenderness: There is no abdominal tenderness.  Musculoskeletal:     Right lower leg: No edema.     Left lower leg: No edema.  Lymphadenopathy:     Cervical: No cervical adenopathy.     Right cervical: No superficial, deep or posterior cervical adenopathy.    Left cervical: No superficial, deep or posterior cervical adenopathy.     Upper Body:     Right upper body: No  supraclavicular or axillary adenopathy.     Left upper body: No supraclavicular or axillary adenopathy.  Neurological:     General: No focal deficit present.     Mental Status: She is alert and oriented to person, place, and time.  Psychiatric:        Mood and Affect: Mood normal.        Behavior: Behavior normal.     LABS:      Latest Ref Rng & Units 11/15/2023    9:04 AM 05/10/2023   10:13 AM 04/18/2023    1:56 PM  CBC  WBC 4.0 - 10.5 K/uL 6.3  5.5  6.4   Hemoglobin 12.0 - 15.0 g/dL 08.6  57.8  46.9   Hematocrit 36.0 - 46.0 % 44.7  42.6  39.5   Platelets 150 - 400 K/uL 252  234  239       Latest Ref Rng & Units 11/15/2023    9:04 AM 05/10/2023   10:13 AM 04/18/2023    1:56 PM  CMP  Glucose 70 - 99 mg/dL 629  94  528   BUN 8 - 23 mg/dL 21  21  14    Creatinine 0.44 - 1.00 mg/dL 4.13  2.44  0.10   Sodium 135 - 145 mmol/L 142  143  144   Potassium 3.5 - 5.1 mmol/L 4.4  4.2  5.0   Chloride 98 - 111 mmol/L 104  106  105   CO2 22 - 32 mmol/L 25  27  23    Calcium 8.9 - 10.3 mg/dL 27.2  9.8  53.6   Total Protein 6.5 - 8.1 g/dL 8.2  8.1  7.2   Total Bilirubin 0.0 - 1.2 mg/dL 0.8  0.7  0.4   Alkaline Phos 38 - 126 U/L 84  78  97   AST 15 - 41 U/L 26  20  24    ALT 0 - 44 U/L 24  19  16       Lab Results  Component Value Date   CEA 20.0 (H) 04/13/2016   /  CEA  Date Value Ref Range Status  04/13/2016 20.0 (H) 0.0 - 4.7 ng/mL Final    Comment:    (NOTE)       Roche ECLIA methodology       Nonsmokers  <3.9                                     Smokers     <5.6 Performed At: [facility] [address] [place], [place] [id number] Mila Homer MD [id number]    No results found for: "PSA1" No results found for: "CAN199" No results found for: "CAN125"  No results found for: "TOTALPROTELP", "ALBUMINELP", "A1GS", "A2GS", "BETS", "BETA2SER", "GAMS", "MSPIKE", "SPEI" Lab Results  Component Value Date   TIBC 345 11/15/2023   TIBC 313 10/31/2022   TIBC 295  05/21/2022   FERRITIN 37 11/15/2023   FERRITIN 54 10/31/2022   FERRITIN 54 05/21/2022   IRONPCTSAT 27 11/15/2023   IRONPCTSAT 21 10/31/2022   IRONPCTSAT 29 05/21/2022   Lab Results  Component Value Date   LDH 146 07/08/2019   LDH 127 01/29/2019     STUDIES:   CT Chest Wo Contrast Result Date: 11/20/2023 CLINICAL DATA:  Non-small cell lung cancer.  * Tracking Code: BO * EXAM: CT CHEST WITHOUT CONTRAST TECHNIQUE: Multidetector CT imaging of the chest was performed following the standard protocol without IV contrast. RADIATION DOSE REDUCTION: This exam was performed according to the departmental dose-optimization program which includes automated exposure control, adjustment of the mA and/or kV according to patient size and/or use of iterative reconstruction technique. COMPARISON:  05/10/2023, 10/31/2022. FINDINGS: Cardiovascular: Atherosclerotic calcification of the aorta, aortic valve and coronary arteries. Enlarged pulmonic trunk. Heart is at the upper limits of normal in size. No pericardial effusion. Mediastinum/Nodes: No pathologically enlarged mediastinal or axillary lymph nodes. Hilar regions are difficult to definitively evaluate without IV contrast. Surgical clips in the left axilla. Esophagus is grossly unremarkable. Lungs/Pleura: Centrilobular and paraseptal emphysema. 3 mm lateral segment right middle lobe nodule (3/98), unchanged from 10/31/2022. No specific follow-up necessary. Left upper lobectomy. No pleural fluid. Airway is otherwise unremarkable. Upper Abdomen: Minimally hyperdense exophytic lesion off the ventral left hepatic lobe (2/122), present dating back to 04/03/2021 and considered benign. Low-attenuation thickening of the left adrenal gland, chronically present. No specific follow-up necessary. Elevated left hemidiaphragm. Visualized portions of the liver, adrenal glands, kidneys, spleen, pancreas, stomach and bowel are otherwise grossly unremarkable. No upper abdominal  adenopathy. Musculoskeletal: Degenerative changes in the spine. No worrisome lytic or sclerotic lesions. IMPRESSION: 1. No evidence of recurrent or metastatic disease. 2. Aortic atherosclerosis (ICD10-I70.0). Coronary artery calcification. 3. Enlarged pulmonic trunk, indicative of pulmonary arterial hypertension. 4.  Emphysema (ICD10-J43.9). Electronically Signed   By: Leanna Battles M.D.   On: 11/20/2023 14:05

## 2023-11-28 ENCOUNTER — Ambulatory Visit (INDEPENDENT_AMBULATORY_CARE_PROVIDER_SITE_OTHER): Payer: Medicare PPO | Admitting: Family Medicine

## 2023-11-28 ENCOUNTER — Encounter: Payer: Self-pay | Admitting: Family Medicine

## 2023-11-28 VITALS — BP 123/73 | HR 76 | Temp 97.7°F | Ht 65.5 in | Wt 147.0 lb

## 2023-11-28 DIAGNOSIS — E782 Mixed hyperlipidemia: Secondary | ICD-10-CM | POA: Diagnosis not present

## 2023-11-28 DIAGNOSIS — H8303 Labyrinthitis, bilateral: Secondary | ICD-10-CM

## 2023-11-28 DIAGNOSIS — E039 Hypothyroidism, unspecified: Secondary | ICD-10-CM | POA: Diagnosis not present

## 2023-11-28 MED ORDER — MECLIZINE HCL 25 MG PO TABS: 25.0000 mg | ORAL_TABLET | Freq: Every day | ORAL | 2 refills | Status: DC | PRN

## 2023-11-28 MED ORDER — ATORVASTATIN CALCIUM 20 MG PO TABS: 20.0000 mg | ORAL_TABLET | Freq: Every day | ORAL | 3 refills | Status: DC

## 2023-11-28 MED ORDER — TRAMADOL HCL 50 MG PO TABS: 50.0000 mg | ORAL_TABLET | Freq: Four times a day (QID) | ORAL | 1 refills | Status: DC | PRN

## 2023-11-28 NOTE — Progress Notes (Signed)
 Subjective:  Patient ID: [name], female    DOB: [birth year]  Age: 83 y.o. MRN: [id number]  CC: Medication Refill (Would like [place] to refill tramadol for pain. HX provider refilled. )   HPI Barrie Lyme presents for pain med refill up under ribs in the front. Dx in past as scar tissue from lung removal surgery. Pain is intermittent. Comes a couple of times a month. Can be unbearable.    follow-up on  thyroid. The patient has a history of hypothyroidism for many years. It has been stable recently. Pt. denies any change in  voice, loss of hair, heat or cold intolerance. Energy level has been adequate to good. Patient denies constipation and diarrhea. No myxedema. Medication is as noted below. Verified that pt is taking it daily on an empty stomach. Well tolerated.       11/28/2023   11:17 AM 04/18/2023    1:06 PM 12/06/2022   11:31 AM  Depression screen PHQ 2/9  Decreased Interest 0 0 1  Down, Depressed, Hopeless 0 0 0  PHQ - 2 Score 0 0 1  Altered sleeping 0  1  Tired, decreased energy 2  1  Change in appetite 0  0  Feeling bad or failure about yourself  0  0  Trouble concentrating 0  0  Moving slowly or fidgety/restless 0  0  Suicidal thoughts 0  0  PHQ-9 Score 2  3  Difficult doing work/chores Not difficult at all  Somewhat difficult    History Rubyann has a past medical history of Adenocarcinoma of left breast (04/16/2012), Breast cancer (HCC), COPD (chronic obstructive pulmonary disease) (HCC), Gall bladder stones, Grave's disease, Headache, High triglycerides, History of kidney stones, Hyperthyroidism, Nerve compression, Pneumonia (1990), PONV (postoperative nausea and vomiting), and Sepsis due to Escherichia coli with acute renal failure without septic shock (HCC).   She has a past surgical history that includes Mastectomy; Carpal tunnel release; cataract surgery; Tonsillectomy; Eye surgery; Laparoscopic nephrectomy (Left, 03/25/2019); Colonoscopy; Node dissection (Left,  09/11/2019); Thoracotomy/lobectomy (Left, 09/11/2019); Nephrectomy (Left); and Cholecystectomy (N/A, 05/05/2021).   Her family history includes Cancer in her brother, father, and another family member.She reports that she quit smoking about 4 years ago. Her smoking use included cigarettes. She started smoking about 64 years ago. She has a 60 pack-year smoking history. She has never used smokeless tobacco. She reports that she does not drink alcohol and does not use drugs.    ROS Review of Systems  Constitutional: Negative.   HENT: Negative.    Eyes:  Negative for visual disturbance.  Respiratory:  Negative for shortness of breath.   Cardiovascular:  Negative for chest pain.  Gastrointestinal:  Negative for abdominal pain.  Musculoskeletal:  Negative for arthralgias.  Neurological:  Positive for dizziness (from inner ear issues.).    Objective:  BP 123/73   Pulse 76   Temp 97.7 F (36.5 C)   Ht 5' 5.5" (1.664 m)   Wt 147 lb (66.7 kg)   SpO2 95%   BMI 24.09 kg/m   BP Readings from Last 3 Encounters:  11/28/23 123/73  11/21/23 (!) 147/72  05/16/23 (!) 144/71    Wt Readings from Last 3 Encounters:  11/28/23 147 lb (66.7 kg)  11/21/23 145 lb 15.1 oz (66.2 kg)  05/16/23 139 lb 6.4 oz (63.2 kg)     Physical Exam Constitutional:      General: She is not in acute distress.    Appearance: She is well-developed.  HENT:     Head: Normocephalic and atraumatic.  Eyes:     Conjunctiva/sclera: Conjunctivae normal.     Pupils: Pupils are equal, round, and reactive to light.  Neck:     Thyroid: No thyromegaly.  Cardiovascular:     Rate and Rhythm: Normal rate and regular rhythm.     Heart sounds: Normal heart sounds. No murmur heard. Pulmonary:     Effort: Pulmonary effort is normal. No respiratory distress.     Breath sounds: Normal breath sounds. No wheezing or rales.  Abdominal:     General: Bowel sounds are normal. There is no distension.     Palpations: Abdomen is soft.      Tenderness: There is no abdominal tenderness.  Musculoskeletal:        General: Normal range of motion.     Cervical back: Normal range of motion and neck supple.  Lymphadenopathy:     Cervical: No cervical adenopathy.  Skin:    General: Skin is warm and dry.  Neurological:     Mental Status: She is alert and oriented to person, place, and time.  Psychiatric:        Behavior: Behavior normal.        Thought Content: Thought content normal.        Judgment: Judgment normal.      Assessment & Plan:  Hypothyroidism, unspecified type -     CBC with Differential/Platelet -     CMP14+EGFR -     TSH + free T4  Mixed hyperlipidemia -     Lipid panel  Labyrinthitis of both ears  Other orders -     Atorvastatin Calcium; Take 1 tablet (20 mg total) by mouth daily.  Dispense: 90 tablet; Refill: 3 -     Meclizine HCl; Take 1 tablet (25 mg total) by mouth daily as needed for dizziness or nausea.  Dispense: 30 tablet; Refill: 2 -     traMADol HCl; Take 1 tablet (50 mg total) by mouth 4 (four) times daily as needed for up to 5 days for moderate pain (pain score 4-6).  Dispense: 30 tablet; Refill: 1     Follow-up: Return in about 6 months (around 05/30/2024).  Mechele Claude, M.D.

## 2023-11-29 LAB — CMP14+EGFR
ALT: 18 IU/L (ref 0–32)
AST: 26 IU/L (ref 0–40)
Albumin: 4.7 g/dL (ref 3.7–4.7)
Alkaline Phosphatase: 103 IU/L (ref 44–121)
BUN/Creatinine Ratio: 13 (ref 12–28)
BUN: 15 mg/dL (ref 8–27)
Bilirubin Total: 0.8 mg/dL (ref 0.0–1.2)
CO2: 23 mmol/L (ref 20–29)
Calcium: 9.7 mg/dL (ref 8.7–10.3)
Chloride: 105 mmol/L (ref 96–106)
Creatinine, Ser: 1.15 mg/dL — ABNORMAL HIGH (ref 0.57–1.00)
Globulin, Total: 2.4 g/dL (ref 1.5–4.5)
Glucose: 86 mg/dL (ref 70–99)
Potassium: 4.6 mmol/L (ref 3.5–5.2)
Sodium: 144 mmol/L (ref 134–144)
Total Protein: 7.1 g/dL (ref 6.0–8.5)
eGFR: 48 mL/min/{1.73_m2} — ABNORMAL LOW (ref >59)

## 2023-11-29 LAB — CBC WITH DIFFERENTIAL/PLATELET
Basophils Absolute: 0.1 10*3/uL (ref 0.0–0.2)
Basos: 1 %
EOS (ABSOLUTE): 0.3 10*3/uL (ref 0.0–0.4)
Eos: 5 %
Hematocrit: 41.5 % (ref 34.0–46.6)
Hemoglobin: 13.7 g/dL (ref 11.1–15.9)
Immature Grans (Abs): 0 10*3/uL (ref 0.0–0.1)
Immature Granulocytes: 0 %
Lymphocytes Absolute: 1.3 10*3/uL (ref 0.7–3.1)
Lymphs: 23 %
MCH: 31 pg (ref 26.6–33.0)
MCHC: 33 g/dL (ref 31.5–35.7)
MCV: 94 fL (ref 79–97)
Monocytes Absolute: 0.4 10*3/uL (ref 0.1–0.9)
Monocytes: 7 %
Neutrophils Absolute: 3.6 10*3/uL (ref 1.4–7.0)
Neutrophils: 64 %
Platelets: 239 10*3/uL (ref 150–450)
RBC: 4.42 x10E6/uL (ref 3.77–5.28)
RDW: 12.5 % (ref 11.7–15.4)
WBC: 5.7 10*3/uL (ref 3.4–10.8)

## 2023-11-29 LAB — LIPID PANEL
Chol/HDL Ratio: 2.7 ratio (ref 0.0–4.4)
Cholesterol, Total: 158 mg/dL (ref 100–199)
HDL: 58 mg/dL (ref >39)
LDL Chol Calc (NIH): 70 mg/dL (ref 0–99)
Triglycerides: 182 mg/dL — ABNORMAL HIGH (ref 0–149)
VLDL Cholesterol Cal: 30 mg/dL (ref 5–40)

## 2023-11-29 LAB — TSH+FREE T4
Free T4: 1.77 ng/dL (ref 0.82–1.77)
TSH: 0.164 u[IU]/mL — ABNORMAL LOW (ref 0.450–4.500)

## 2023-12-02 ENCOUNTER — Encounter: Payer: Self-pay | Admitting: Family Medicine

## 2023-12-02 ENCOUNTER — Other Ambulatory Visit: Payer: Self-pay | Admitting: Family Medicine

## 2023-12-02 MED ORDER — LEVOTHYROXINE SODIUM 88 MCG PO TABS: 88.0000 ug | ORAL_TABLET | Freq: Every day | ORAL | 1 refills | Status: DC

## 2023-12-06 ENCOUNTER — Inpatient Hospital Stay

## 2023-12-06 ENCOUNTER — Telehealth: Payer: Self-pay | Admitting: Family Medicine

## 2023-12-06 NOTE — Telephone Encounter (Signed)
 Left message for daughter Clydie Braun) to call back to schedule lab appointment to have her thyroid rechecked per PCP request (see lab result notes). Will need nurse to add future lab order for this once appointment is scheduled.    Copied from CRM [id number]. Topic: Clinical - Lab/Test Results >> Dec 06, 2023 10:27 AM Turkey B wrote: Reason for CRM: pt 's daughter called in states was told to have lab test redone, needs orders put back in?

## 2023-12-12 ENCOUNTER — Other Ambulatory Visit

## 2024-01-23 ENCOUNTER — Other Ambulatory Visit: Payer: Self-pay | Admitting: Family Medicine

## 2024-03-10 ENCOUNTER — Other Ambulatory Visit: Payer: Self-pay | Admitting: Family Medicine

## 2024-03-11 ENCOUNTER — Other Ambulatory Visit: Payer: Self-pay | Admitting: Family Medicine

## 2024-04-16 ENCOUNTER — Ambulatory Visit (INDEPENDENT_AMBULATORY_CARE_PROVIDER_SITE_OTHER): Admitting: Family Medicine

## 2024-04-16 ENCOUNTER — Encounter: Payer: Self-pay | Admitting: Family Medicine

## 2024-04-16 VITALS — BP 123/68 | HR 81 | Temp 97.6°F | Ht 65.0 in | Wt 138.4 lb

## 2024-04-16 DIAGNOSIS — R1084 Generalized abdominal pain: Secondary | ICD-10-CM

## 2024-04-16 DIAGNOSIS — D485 Neoplasm of uncertain behavior of skin: Secondary | ICD-10-CM

## 2024-04-16 DIAGNOSIS — E039 Hypothyroidism, unspecified: Secondary | ICD-10-CM

## 2024-04-16 MED ORDER — TRAMADOL HCL 50 MG PO TABS: 50.0000 mg | ORAL_TABLET | Freq: Four times a day (QID) | ORAL | 5 refills | Status: DC | PRN

## 2024-04-17 LAB — TSH+FREE T4
Free T4: 1.27 ng/dL (ref 0.82–1.77)
TSH: 2.29 u[IU]/mL (ref 0.450–4.500)

## 2024-04-19 ENCOUNTER — Encounter: Payer: Self-pay | Admitting: Family Medicine

## 2024-04-19 ENCOUNTER — Ambulatory Visit: Payer: Self-pay | Admitting: Family Medicine

## 2024-04-19 NOTE — Progress Notes (Signed)
Hello [name],  Your lab result is normal and/or stable.Some minor variations that are not significant are commonly marked abnormal, but do not represent any medical problem for you.  Best regards, Claretta Fraise, M.D.

## 2024-04-19 NOTE — Progress Notes (Signed)
 Subjective:  Patient ID: [name], female    DOB: 1941/02/02  Age: 83 y.o. MRN: [id number]  CC: THYROID  RECHECK and Medical Management of Chronic Issues (DOSAGE CHANGE- TRAMADOL )   HPI  Discussed the use of AI scribe software for clinical note transcription with the patient, who gave verbal consent to proceed.  History of Present Illness   [name] is an 83 year old female with hypothyroidism and chronic pain who presents for a follow-up on thyroid  medication and pain management. She is accompanied by her daughter, Sari.  A change in her thyroid  medication dosage a few months ago has not resulted in any noticeable changes in her symptoms. She experiences no jitteriness, nervousness, or sluggishness since the adjustment.  She experiences chronic pain across the upper abdomen, described as a sharp pain that comes and goes, extending from the left to the right side along the ribs. Pain relief is achieved with tramadol , but its effectiveness has decreased. She takes tramadol  as needed, but sometimes experiences delays in taking it due to accessibility issues. Sari mentions she manages the medication carefully due to concerns about her memory.  She has a history of moles and previously had moles removed by a dermatologist, Dr. Shona, with no issues reported. She has a family history of moles.  She reports swelling and tenderness in her ankle, possibly due to a recent twist. The pain is not severe enough to limit activities but is uncomfortable when touched. She has difficulty keeping her leg elevated while sleeping due to movement during sleep.  In terms of bowel habits, she has no issues with constipation or diarrhea.             04/16/2024   10:15 AM 04/16/2024   10:08 AM 11/28/2023   11:17 AM  Depression screen PHQ 2/9  Decreased Interest 1 0 0  Down, Depressed, Hopeless 0 0 0  PHQ - 2 Score 1 0 0  Altered sleeping 0  0  Tired, decreased energy 1  2  Change in  appetite 0  0  Feeling bad or failure about yourself  0  0  Trouble concentrating 0  0  Moving slowly or fidgety/restless 0  0  Suicidal thoughts 0  0  PHQ-9 Score 2  2  Difficult doing work/chores Somewhat difficult  Not difficult at all    History Margretta has a past medical history of Adenocarcinoma of left breast (04/16/2012), AKI (acute kidney injury) (HCC), Breast cancer (HCC), COPD (chronic obstructive pulmonary disease) (HCC), Diarrhea (01/02/2022), Gall bladder stones, Grave's disease, Headache, High triglycerides, History of kidney stones, Hyperthyroidism, Nerve compression, Pneumonia (1990), PONV (postoperative nausea and vomiting), and Sepsis due to Escherichia coli with acute renal failure without septic shock (HCC).   She has a past surgical history that includes Mastectomy; Carpal tunnel release; cataract surgery; Tonsillectomy; Eye surgery; Laparoscopic nephrectomy (Left, 03/25/2019); Colonoscopy; Node dissection (Left, 09/11/2019); Thoracotomy/lobectomy (Left, 09/11/2019); Nephrectomy (Left); and Cholecystectomy (N/A, 05/05/2021).   Her family history includes Cancer in her brother, father, and another family member.She reports that she quit smoking about 4 years ago. Her smoking use included cigarettes. She started smoking about 64 years ago. She has a 60 pack-year smoking history. She has never used smokeless tobacco. She reports that she does not drink alcohol  and does not use drugs.    ROS Review of Systems  Constitutional: Negative.   HENT:  Negative for congestion.   Eyes:  Negative for visual disturbance.  Respiratory:  Negative  for shortness of breath.   Cardiovascular:  Negative for chest pain.  Gastrointestinal:  Negative for abdominal pain, constipation, diarrhea, nausea and vomiting.  Genitourinary:  Negative for difficulty urinating.  Musculoskeletal:  Negative for arthralgias and myalgias.  Neurological:  Negative for headaches.  Psychiatric/Behavioral:   Negative for sleep disturbance.     Objective:  BP 123/68   Pulse 81   Temp 97.6 F (36.4 C)   Ht 5' 5 (1.651 m)   Wt 138 lb 6.4 oz (62.8 kg)   SpO2 93%   BMI 23.03 kg/m   BP Readings from Last 3 Encounters:  04/16/24 123/68  11/28/23 123/73  11/21/23 (!) 147/72    Wt Readings from Last 3 Encounters:  04/16/24 138 lb 6.4 oz (62.8 kg)  11/28/23 147 lb (66.7 kg)  11/21/23 145 lb 15.1 oz (66.2 kg)     Physical Exam Constitutional:      General: She is not in acute distress.    Appearance: She is well-developed.  HENT:     Head: Normocephalic and atraumatic.  Eyes:     Conjunctiva/sclera: Conjunctivae normal.     Pupils: Pupils are equal, round, and reactive to light.  Neck:     Thyroid : No thyromegaly.  Cardiovascular:     Rate and Rhythm: Normal rate and regular rhythm.     Heart sounds: Normal heart sounds. No murmur heard. Pulmonary:     Effort: Pulmonary effort is normal. No respiratory distress.     Breath sounds: Normal breath sounds. No wheezing or rales.  Abdominal:     General: Bowel sounds are normal. There is no distension.     Palpations: Abdomen is soft.     Tenderness: There is no abdominal tenderness.  Musculoskeletal:        General: Normal range of motion.     Cervical back: Normal range of motion and neck supple.  Lymphadenopathy:     Cervical: No cervical adenopathy.  Skin:    General: Skin is warm and dry.  Neurological:     Mental Status: She is alert and oriented to person, place, and time.  Psychiatric:        Behavior: Behavior normal.        Thought Content: Thought content normal.        Judgment: Judgment normal.      Assessment & Plan:  Hypothyroidism, unspecified type -     TSH + free T4  Neoplasm of uncertain behavior of skin  Generalized abdominal pain  Other orders -     traMADol  HCl; Take 1 tablet (50 mg total) by mouth 4 (four) times daily as needed for moderate pain (pain score 4-6).  Dispense: 30 tablet;  Refill: 5    Assessment and Plan    Hypothyroidism Monitored post-medication dosage change. No symptoms of jitteriness, nervousness, or sluggishness. Emphasized medication adherence. - Order thyroid  function tests to assess current medication efficacy.  Chronic pain Sharp, intermittent upper abdominal pain. Tramadol  less effective. Discussed pain management strategy and potential dosage increase. - Refill tramadol  prescription with several refills. - Advise taking tramadol  as soon as pain is anticipated. - Instruct to take a second tramadol  if pain is not relieved after one hour.  Right ankle contusion and swelling Contusion with swelling and tenderness, healing indicated by yellow bruising. Pain present but mobility unaffected. - Fit with an ankle brace to provide support and reduce swelling.  Skin lesion, possible malignancy (cheek) Cheek lesion with potential malignancy characteristics. Referral to dermatologist  discussed. - Recommend contacting Dr. Shona, dermatologist, for evaluation of the cheek lesion.          Follow-up: Return in about 6 months (around 10/17/2024) for Hypothyroidism, Pain.  Butler Der, M.D.

## 2024-04-27 ENCOUNTER — Other Ambulatory Visit: Payer: Self-pay | Admitting: Family Medicine

## 2024-04-29 ENCOUNTER — Other Ambulatory Visit (HOSPITAL_COMMUNITY): Payer: Self-pay | Admitting: Family Medicine

## 2024-04-29 DIAGNOSIS — Z1231 Encounter for screening mammogram for malignant neoplasm of breast: Secondary | ICD-10-CM

## 2024-04-30 ENCOUNTER — Other Ambulatory Visit: Payer: Self-pay

## 2024-04-30 ENCOUNTER — Observation Stay (HOSPITAL_COMMUNITY)
Admission: EM | Admit: 2024-04-30 | Discharge: 2024-05-04 | Disposition: A | Discharge status: Skilled Nursing Facility | Attending: Internal Medicine | Admitting: Internal Medicine

## 2024-04-30 ENCOUNTER — Emergency Department (HOSPITAL_COMMUNITY)

## 2024-04-30 ENCOUNTER — Observation Stay (HOSPITAL_COMMUNITY)

## 2024-04-30 ENCOUNTER — Encounter (HOSPITAL_COMMUNITY): Payer: Self-pay

## 2024-04-30 DIAGNOSIS — R9431 Abnormal electrocardiogram [ECG] [EKG]: Secondary | ICD-10-CM | POA: Diagnosis not present

## 2024-04-30 DIAGNOSIS — G319 Degenerative disease of nervous system, unspecified: Secondary | ICD-10-CM | POA: Diagnosis not present

## 2024-04-30 DIAGNOSIS — Z905 Acquired absence of kidney: Secondary | ICD-10-CM | POA: Diagnosis not present

## 2024-04-30 DIAGNOSIS — J449 Chronic obstructive pulmonary disease, unspecified: Secondary | ICD-10-CM | POA: Diagnosis not present

## 2024-04-30 DIAGNOSIS — R5381 Other malaise: Secondary | ICD-10-CM | POA: Diagnosis present

## 2024-04-30 DIAGNOSIS — N1831 Chronic kidney disease, stage 3a: Secondary | ICD-10-CM | POA: Insufficient documentation

## 2024-04-30 DIAGNOSIS — K5730 Diverticulosis of large intestine without perforation or abscess without bleeding: Secondary | ICD-10-CM | POA: Diagnosis not present

## 2024-04-30 DIAGNOSIS — E039 Hypothyroidism, unspecified: Secondary | ICD-10-CM | POA: Insufficient documentation

## 2024-04-30 DIAGNOSIS — N390 Urinary tract infection, site not specified: Secondary | ICD-10-CM | POA: Diagnosis not present

## 2024-04-30 DIAGNOSIS — I6782 Cerebral ischemia: Secondary | ICD-10-CM | POA: Diagnosis not present

## 2024-04-30 DIAGNOSIS — M25562 Pain in left knee: Secondary | ICD-10-CM | POA: Diagnosis not present

## 2024-04-30 DIAGNOSIS — E860 Dehydration: Secondary | ICD-10-CM | POA: Insufficient documentation

## 2024-04-30 DIAGNOSIS — R319 Hematuria, unspecified: Secondary | ICD-10-CM | POA: Diagnosis not present

## 2024-04-30 DIAGNOSIS — M11262 Other chondrocalcinosis, left knee: Secondary | ICD-10-CM | POA: Diagnosis not present

## 2024-04-30 DIAGNOSIS — R531 Weakness: Secondary | ICD-10-CM | POA: Diagnosis not present

## 2024-04-30 DIAGNOSIS — M1712 Unilateral primary osteoarthritis, left knee: Secondary | ICD-10-CM | POA: Diagnosis not present

## 2024-04-30 DIAGNOSIS — Z043 Encounter for examination and observation following other accident: Secondary | ICD-10-CM | POA: Diagnosis not present

## 2024-04-30 DIAGNOSIS — W19XXXA Unspecified fall, initial encounter: Secondary | ICD-10-CM | POA: Diagnosis not present

## 2024-04-30 LAB — CBC WITH DIFFERENTIAL/PLATELET
Abs Immature Granulocytes: 0.01 K/uL (ref 0.00–0.07)
Basophils Absolute: 0 K/uL (ref 0.0–0.1)
Basophils Relative: 0 %
Eosinophils Absolute: 0.2 K/uL (ref 0.0–0.5)
Eosinophils Relative: 2 %
HCT: 45.3 % (ref 36.0–46.0)
Hemoglobin: 14 g/dL (ref 12.0–15.0)
Immature Granulocytes: 0 %
Lymphocytes Relative: 9 %
Lymphs Abs: 0.7 K/uL (ref 0.7–4.0)
MCH: 30.9 pg (ref 26.0–34.0)
MCHC: 30.9 g/dL (ref 30.0–36.0)
MCV: 100 fL (ref 80.0–100.0)
Monocytes Absolute: 0.6 K/uL (ref 0.1–1.0)
Monocytes Relative: 7 %
Neutro Abs: 6.2 K/uL (ref 1.7–7.7)
Neutrophils Relative %: 82 %
Platelets: 218 K/uL (ref 150–400)
RBC: 4.53 MIL/uL (ref 3.87–5.11)
RDW: 13.9 % (ref 11.5–15.5)
WBC: 7.6 K/uL (ref 4.0–10.5)
nRBC: 0 % (ref 0.0–0.2)

## 2024-04-30 LAB — URINALYSIS, ROUTINE W REFLEX MICROSCOPIC
Bilirubin Urine: NEGATIVE
Glucose, UA: NEGATIVE mg/dL
Ketones, ur: 5 mg/dL — AB
Nitrite: POSITIVE — AB
Protein, ur: 100 mg/dL — AB
RBC / HPF: >50 RBC/hpf (ref 0–5)
Specific Gravity, Urine: 1.015 (ref 1.005–1.030)
WBC, UA: >50 WBC/hpf (ref 0–5)
pH: 6 (ref 5.0–8.0)

## 2024-04-30 LAB — COMPREHENSIVE METABOLIC PANEL WITH GFR
ALT: 25 U/L (ref 0–44)
AST: 34 U/L (ref 15–41)
Albumin: 3.1 g/dL — ABNORMAL LOW (ref 3.5–5.0)
Alkaline Phosphatase: 108 U/L (ref 38–126)
Anion gap: 15 (ref 5–15)
BUN: 43 mg/dL — ABNORMAL HIGH (ref 8–23)
CO2: 20 mmol/L — ABNORMAL LOW (ref 22–32)
Calcium: 9.2 mg/dL (ref 8.9–10.3)
Chloride: 106 mmol/L (ref 98–111)
Creatinine, Ser: 1.58 mg/dL — ABNORMAL HIGH (ref 0.44–1.00)
GFR, Estimated: 33 mL/min — ABNORMAL LOW (ref >60)
Glucose, Bld: 108 mg/dL — ABNORMAL HIGH (ref 70–99)
Potassium: 4 mmol/L (ref 3.5–5.1)
Sodium: 141 mmol/L (ref 135–145)
Total Bilirubin: 1.1 mg/dL (ref 0.0–1.2)
Total Protein: 7.4 g/dL (ref 6.5–8.1)

## 2024-04-30 LAB — AMMONIA: Ammonia: 19 umol/L (ref 9–35)

## 2024-04-30 LAB — VITAMIN B12: Vitamin B-12: 681 pg/mL (ref 180–914)

## 2024-04-30 LAB — CK: Total CK: 64 U/L (ref 38–234)

## 2024-04-30 MED ORDER — HEPARIN SODIUM (PORCINE) 5000 UNIT/ML IJ SOLN
5000.0000 [IU] | Freq: Three times a day (TID) | INTRAMUSCULAR | Status: DC
  Administered 2024-04-30 – 2024-05-04 (×11): 5000 [IU] via SUBCUTANEOUS
  Filled 2024-04-30 (×11): qty 1

## 2024-04-30 MED ORDER — SODIUM CHLORIDE 0.9 % IV SOLN
1.0000 g | INTRAVENOUS | Status: DC
  Administered 2024-05-01: 1 g via INTRAVENOUS
  Filled 2024-04-30: qty 10

## 2024-04-30 MED ORDER — ACETAMINOPHEN 325 MG PO TABS: 650.0000 mg | ORAL_TABLET | Freq: Four times a day (QID) | ORAL | Status: DC | PRN

## 2024-04-30 MED ORDER — SODIUM CHLORIDE 0.9 % IV SOLN: INTRAVENOUS | Status: AC

## 2024-04-30 MED ORDER — ACETAMINOPHEN 650 MG RE SUPP: 650.0000 mg | Freq: Four times a day (QID) | RECTAL | Status: DC | PRN

## 2024-04-30 MED ORDER — BISACODYL 5 MG PO TBEC: 5.0000 mg | DELAYED_RELEASE_TABLET | Freq: Every day | ORAL | Status: DC | PRN

## 2024-04-30 MED ORDER — SODIUM CHLORIDE 0.9 % IV SOLN
1.0000 g | Freq: Once | INTRAVENOUS | Status: AC
  Administered 2024-04-30: 1 g via INTRAVENOUS
  Filled 2024-04-30: qty 10

## 2024-04-30 MED ORDER — LACTATED RINGERS IV BOLUS
500.0000 mL | Freq: Once | INTRAVENOUS | Status: AC
  Administered 2024-04-30: 500 mL via INTRAVENOUS

## 2024-04-30 MED ORDER — FENTANYL CITRATE (PF) 100 MCG/2ML IJ SOLN: 12.5000 ug | INTRAMUSCULAR | Status: DC | PRN

## 2024-04-30 MED ORDER — LEVOTHYROXINE SODIUM 88 MCG PO TABS
88.0000 ug | ORAL_TABLET | Freq: Every day | ORAL | Status: DC
  Administered 2024-05-01 – 2024-05-04 (×4): 88 ug via ORAL
  Filled 2024-04-30 (×4): qty 1

## 2024-04-30 MED ORDER — ATORVASTATIN CALCIUM 20 MG PO TABS
20.0000 mg | ORAL_TABLET | Freq: Every day | ORAL | Status: DC
  Administered 2024-05-01 – 2024-05-04 (×4): 20 mg via ORAL
  Filled 2024-04-30 (×4): qty 1

## 2024-04-30 MED ORDER — PROCHLORPERAZINE EDISYLATE 10 MG/2ML IJ SOLN: 5.0000 mg | Freq: Four times a day (QID) | INTRAMUSCULAR | Status: DC | PRN

## 2024-04-30 MED ORDER — OXYCODONE HCL 5 MG PO TABS: 2.5000 mg | ORAL_TABLET | ORAL | Status: DC | PRN

## 2024-04-30 MED ORDER — SENNOSIDES-DOCUSATE SODIUM 8.6-50 MG PO TABS
1.0000 | ORAL_TABLET | Freq: Every evening | ORAL | Status: DC | PRN
Start: 2024-04-30 — End: 2024-05-04

## 2024-04-30 NOTE — ED Provider Notes (Signed)
 [place] EMERGENCY DEPARTMENT AT [facility] Provider Note   CSN: [id number] Arrival date & time: 04/30/24  1419     Patient presents with: [name] is a 83 y.o. female.    Fall Pertinent negatives include no chest pain, no abdominal pain, no headaches and no shortness of breath.       [name] is a 83 y.o. female with past medical history of breast cancer, COPD, Graves' disease, who presents to the Emergency Department for evaluation of fall that happened 5 days ago.  Patient does not recall falling, but family member at bedside states that she was seen Sunday afternoon and family member checked on her again Monday afternoon and she was found lying on the floor of her bedroom.  She is complaining of left hip pain and weakness.  Daughter states that she normally walks with walker at baseline but will not sit up or attempt to walk since Monday.  She has transferred to a commode with assistance.  Daughter also states that she is not eating and drinking normally.  No vomiting or known fever.    Patient does live alone.  Prior to Admission medications   Medication Sig Start Date End Date Taking? Authorizing Provider  acetaminophen  (TYLENOL ) 500 MG tablet Take 500 mg by mouth every 8 (eight) hours as needed for moderate pain.    [provider]  atorvastatin  (LIPITOR) 20 MG tablet TAKE 1 TABLET EVERY DAY FOR CHOLESTEROL 01/23/24   Zollie Lowers, MD  Calcium -Vitamin D ([employer] CALCIUM -600/VIT D PO) Take 1 capsule by mouth in the morning and at bedtime.    [provider]  ferrous sulfate  325 (65 FE) MG EC tablet Take 325 mg by mouth daily.    [provider]  hydroxypropyl methylcellulose / hypromellose (ISOPTO TEARS / GONIOVISC) 2.5 % ophthalmic solution Place 1 drop into both eyes 3 (three) times daily as needed for dry eyes.     [provider]  levothyroxine  (SYNTHROID ) 88 MCG tablet TAKE 1 TABLET EVERY DAY BEFORE BREAKFAST  04/27/24   Zollie Lowers, MD  meclizine  (ANTIVERT ) 25 MG tablet TAKE ONE TABLET BY MOUTH DAILY AS NEEDED FOR DIZZINESS FOR NAUSEA 03/11/24   Zollie Lowers, MD  Multiple Vitamins-Minerals (CENTRUM SILVER ULTRA WOMENS) TABS Take 1 tablet by mouth daily.    [provider]  traMADol  (ULTRAM ) 50 MG tablet Take 1 tablet (50 mg total) by mouth 4 (four) times daily as needed for moderate pain (pain score 4-6). 04/16/24   Zollie Lowers, MD    Allergies: Dicyclomine  hcl and Chantix [varenicline]    Review of Systems  Constitutional:  Positive for appetite change. Negative for chills and fever.  Respiratory:  Negative for shortness of breath.   Cardiovascular:  Negative for chest pain.  Gastrointestinal:  Negative for abdominal pain, nausea and vomiting.  Genitourinary:  Negative for difficulty urinating.  Musculoskeletal:  Positive for arthralgias (Left hip pain).  Neurological:  Positive for weakness. Negative for dizziness and headaches.    Updated Vital Signs BP 126/74   Pulse 94   Temp 97.6 F (36.4 C) (Oral)   Resp (!) 25   Ht 5' 5 (1.651 m)   Wt 62.8 kg   SpO2 91%   BMI 23.03 kg/m   Physical Exam Vitals and nursing note reviewed.  Constitutional:      General: She is not in acute distress.    Appearance: Normal appearance.  HENT:  Mouth/Throat:     Mouth: Mucous membranes are dry.  Eyes:     Pupils: Pupils are equal, round, and reactive to light.  Cardiovascular:     Rate and Rhythm: Regular rhythm. Tachycardia present.     Pulses: Normal pulses.  Pulmonary:     Effort: Pulmonary effort is normal.  Chest:     Chest wall: No tenderness.  Abdominal:     Palpations: Abdomen is soft.     Tenderness: There is no abdominal tenderness.  Musculoskeletal:        General: Tenderness and signs of injury present.     Cervical back: Normal range of motion. No tenderness.     Left hip: Tenderness present. No deformity or crepitus. Decreased range of motion. Normal  strength.     Right lower leg: No edema.     Left lower leg: No edema.     Comments: Pain with range of motion of the left hip.  No shortening or external rotation of the extremity.  Skin:    General: Skin is warm.     Capillary Refill: Capillary refill takes less than 2 seconds.  Neurological:     General: No focal deficit present.     Mental Status: She is alert.     Sensory: No sensory deficit.     Motor: No weakness.     (all labs ordered are listed, but only abnormal results are displayed) Labs Reviewed  URINALYSIS, ROUTINE W REFLEX MICROSCOPIC - Abnormal; Notable for the following components:      Result Value   APPearance CLOUDY (*)    Hgb urine dipstick LARGE (*)    Ketones, ur 5 (*)    Protein, ur 100 (*)    Nitrite POSITIVE (*)    Leukocytes,Ua LARGE (*)    Bacteria, UA FEW (*)    All other components within normal limits  COMPREHENSIVE METABOLIC PANEL WITH GFR - Abnormal; Notable for the following components:   CO2 20 (*)    Glucose, Bld 108 (*)    BUN 43 (*)    Creatinine, Ser 1.58 (*)    Albumin  3.1 (*)    GFR, Estimated 33 (*)    All other components within normal limits  URINE CULTURE  CBC WITH DIFFERENTIAL/PLATELET  CK    EKG: EKG Interpretation Date/Time:  Thursday April 30 2024 16:28:42 EDT Ventricular Rate:  98 PR Interval:  134 QRS Duration:  107 QT Interval:  370 QTC Calculation: 473 R Axis:   -53  Text Interpretation: Sinus rhythm Probable left atrial enlargement Incomplete RBBB and LAFB Consider RVH w/ secondary repol abnormality Left ventricular hypertrophy Confirmed by Charlyn Sora [phone]) on 04/30/2024 6:05:00 PM  Radiology: CT CHEST ABDOMEN PELVIS WO CONTRAST Result Date: 04/30/2024 CLINICAL DATA:  Unwitnessed fall several days ago. EXAM: CT CHEST, ABDOMEN AND PELVIS WITHOUT CONTRAST TECHNIQUE: Multidetector CT imaging of the chest, abdomen and pelvis was performed following the standard protocol without IV contrast. RADIATION DOSE  REDUCTION: This exam was performed according to the departmental dose-optimization program which includes automated exposure control, adjustment of the mA and/or kV according to patient size and/or use of iterative reconstruction technique. COMPARISON:  November 15, 2023.  April 26, 2022. FINDINGS: CT CHEST FINDINGS Cardiovascular: Atherosclerosis of thoracic aorta without aneurysm formation. Normal cardiac size. No pericardial effusion. Coronary artery calcifications are noted. Mediastinum/Nodes: No enlarged mediastinal, hilar, or axillary lymph nodes. Thyroid  gland, trachea, and esophagus demonstrate no significant findings. Lungs/Pleura: No pneumothorax or pleural effusion is noted.  Minimal right posterior basilar subsegmental atelectasis. Left lingular scarring or subsegmental atelectasis is noted. Musculoskeletal: No chest wall mass or suspicious bone lesions identified. CT ABDOMEN PELVIS FINDINGS Hepatobiliary: No focal liver abnormality is seen. Status post cholecystectomy. No biliary dilatation. Pancreas: Unremarkable. No pancreatic ductal dilatation or surrounding inflammatory changes. Spleen: Normal in size without focal abnormality. Adrenals/Urinary Tract: Adrenal glands appear normal. Status post left nephrectomy. Nonobstructive right nephrolithiasis is noted. Mild right hydroureteronephrosis is noted without obstructing calculus. Urinary bladder is unremarkable. Stomach/Bowel: Stomach is within normal limits. Appendix appears normal. No evidence of bowel wall thickening, distention, or inflammatory changes. Sigmoid diverticulosis without inflammation. Vascular/Lymphatic: Aortic atherosclerosis. No enlarged abdominal or pelvic lymph nodes. Reproductive: Uterus and bilateral adnexa are unremarkable. Other: No ascites or hernia is noted. Musculoskeletal: No acute or significant osseous findings. IMPRESSION: 1. No definite traumatic injury seen in the chest, abdomen or pelvis. 2. Coronary artery  calcifications are noted suggesting coronary artery disease. 3. Status post left nephrectomy. 4. Nonobstructive right nephrolithiasis. Mild right hydroureteronephrosis is noted without obstructing calculus. 5. Sigmoid diverticulosis without inflammation. 6. Aortic atherosclerosis. Aortic Atherosclerosis (ICD10-I70.0). Electronically Signed   By: Lynwood Landy Raddle M.D.   On: 04/30/2024 16:52   CT Head Wo Contrast Result Date: 04/30/2024 CLINICAL DATA:  Fall EXAM: CT HEAD WITHOUT CONTRAST CT CERVICAL SPINE WITHOUT CONTRAST TECHNIQUE: Multidetector CT imaging of the head and cervical spine was performed following the standard protocol without intravenous contrast. Multiplanar CT image reconstructions of the cervical spine were also generated. RADIATION DOSE REDUCTION: This exam was performed according to the departmental dose-optimization program which includes automated exposure control, adjustment of the mA and/or kV according to patient size and/or use of iterative reconstruction technique. COMPARISON:  Chest CT 11/15/2023 FINDINGS: CT HEAD FINDINGS Brain: No acute territorial infarction, hemorrhage or intracranial mass. Atrophy and moderate chronic small vessel ischemic changes of the white matter. Nonenlarged ventricles. Probable chronic lacunar infarcts in the right thalamus and basal ganglia. Vascular: No hyperdense vessels.  Carotid vascular calcification Skull: Normal. Negative for fracture or focal lesion. Sinuses/Orbits: No acute finding. Other: None CT CERVICAL SPINE FINDINGS Alignment: Straightening of the cervical spine. Trace anterolisthesis C4 on C5. Facet alignment is within normal limits. Skull base and vertebrae: No acute fracture. No primary bone lesion or focal pathologic process. Soft tissues and spinal canal: No prevertebral fluid or swelling. No visible canal hematoma. Disc levels: Multilevel degenerative changes. Advanced disc space narrowing C5-C6 and C6-C7. Multilevel facet degenerative  changes with foraminal narrowing. Upper chest: Apical scarring.  Mild emphysema Other: None IMPRESSION: No CT evidence for acute intracranial abnormality. Atrophy and chronic small vessel ischemic changes of the white matter. Straightening of the cervical spine with degenerative changes. No acute osseous abnormality. Electronically Signed   By: Luke Bun M.D.   On: 04/30/2024 16:48   CT Cervical Spine Wo Contrast Result Date: 04/30/2024 CLINICAL DATA:  Fall EXAM: CT HEAD WITHOUT CONTRAST CT CERVICAL SPINE WITHOUT CONTRAST TECHNIQUE: Multidetector CT imaging of the head and cervical spine was performed following the standard protocol without intravenous contrast. Multiplanar CT image reconstructions of the cervical spine were also generated. RADIATION DOSE REDUCTION: This exam was performed according to the departmental dose-optimization program which includes automated exposure control, adjustment of the mA and/or kV according to patient size and/or use of iterative reconstruction technique. COMPARISON:  Chest CT 11/15/2023 FINDINGS: CT HEAD FINDINGS Brain: No acute territorial infarction, hemorrhage or intracranial mass. Atrophy and moderate chronic small vessel ischemic changes of the white matter.  Nonenlarged ventricles. Probable chronic lacunar infarcts in the right thalamus and basal ganglia. Vascular: No hyperdense vessels.  Carotid vascular calcification Skull: Normal. Negative for fracture or focal lesion. Sinuses/Orbits: No acute finding. Other: None CT CERVICAL SPINE FINDINGS Alignment: Straightening of the cervical spine. Trace anterolisthesis C4 on C5. Facet alignment is within normal limits. Skull base and vertebrae: No acute fracture. No primary bone lesion or focal pathologic process. Soft tissues and spinal canal: No prevertebral fluid or swelling. No visible canal hematoma. Disc levels: Multilevel degenerative changes. Advanced disc space narrowing C5-C6 and C6-C7. Multilevel facet  degenerative changes with foraminal narrowing. Upper chest: Apical scarring.  Mild emphysema Other: None IMPRESSION: No CT evidence for acute intracranial abnormality. Atrophy and chronic small vessel ischemic changes of the white matter. Straightening of the cervical spine with degenerative changes. No acute osseous abnormality. Electronically Signed   By: Luke Bun M.D.   On: 04/30/2024 16:48     Procedures   Medications Ordered in the ED  cefTRIAXone  (ROCEPHIN ) 1 g in sodium chloride  0.9 % 100 mL IVPB (has no administration in time range)  lactated ringers  bolus 500 mL (500 mLs Intravenous New Bag/Given 04/30/24 1753)                                    Medical Decision Making Patient comes from home after fall 5 days ago.  Fall was unwitnessed family found patient lying in the floor.  Has been essentially nonweightbearing since her fall no reported fever or chills.  She complains of left hip pain.  Family states patient lives alone and has not been eating drinking since her fall  Patient does have pain with range of motion of the left hip I do not appreciate any shortening or external rotation on exam she is essentially nontender on her remaining exam.  I do not appreciate any focal neurologic deficits  Given her generalized weakness and hesitancy to sit up or stand since her fall, will pan scan for injuries check labs and urinalysis.    Amount and/or Complexity of Data Reviewed Labs: ordered.    Details: No leukocytosis, CK reassuring.  Chemistries show mild elevation of chronic CKD, no definite AKI, urinalysis shows cloudy urine with large hemoglobin and nitrite positive urine culture pending Radiology: ordered.    Details: CT scan head and cervical spine without acute intracranial or osseous findings  CT chest abdomen and pelvis without evidence of traumatic injury  ECG/medicine tests: ordered.    Details: EKG shows sinus rhythm with probable left atrial enlargement incomplete  right bundle branch block and LAFB Discussion of management or test interpretation with external provider(s): Patient refused to ambulate.  Family can no longer care for her at home would prefer placement. Workup today does show that she has urinary tract infection.  No acute fractures.  Given Rocephin  here her urine cultured.  Previous tachycardia improved after IV fluids.   Consulted with [facility] hospitalist, Dr. Charlton who agrees to admit       Risk Decision regarding hospitalization.        Final diagnoses:  Urinary tract infection with hematuria, site unspecified  General weakness    ED Discharge Orders     None          [name] 04/30/24 2002    Francesca Elsie CROME, MD 05/01/24 [phone]

## 2024-04-30 NOTE — ED Triage Notes (Signed)
 Pt family reports pt fell on Sunday but did not want to come to the hospital.  Pt reports left hip and knee pain but denies any other pain, shob, dizziness, or problems with anything else.

## 2024-04-30 NOTE — Progress Notes (Signed)
 Patient admitted to room 306 from ED.  Patient is accompanied by her daughter Darice and her granddaughter.  Patient lives at home by herself, family checks on her.  She fell on Sunday and refused to be seen, and has not been taking care of herself since and was brought in.  Patient is alert to self, confused to time, place and situation.  Is hard of hearing has hearing aids but refuses to use them.  Family is concerned about her living on her own.

## 2024-04-30 NOTE — H&P (Signed)
 History and Physical    [name] FMW:[id number] DOB: [birth year] DOA: 04/30/2024  PCP: Zollie Lowers, MD   Patient coming from: Home   Chief Complaint: Left hip and knee pain after a fall, loss of appetite, general weakness   HPI: [name] is a 83 y.o. female with medical history significant for lung cancer status post left upper lobectomy, renal cell carcinoma status post left nephrectomy, remote breast cancer, COPD, hypothyroidism, and hyperlipidemia, now presenting with left hip and left knee pain after fall, generalized weakness, and loss of appetite.  Patient lives alone, is typically able to perform her ADLs independently and ambulate with a walker, but has not been getting out of bed since 04/27/2024.  She seemed to be in her usual state on 04/26/2024 when family saw her, but they found her on the ground the following day.  She was complaining of pain in her left hip and left knee.  She has not been eating or drinking much.  She denies chest pain or abdominal pain, cough or shortness of breath, dysuria or flank pain.  Family at the bedside reports that she did complain of some pain while urinating earlier.  ED Course: Upon arrival to the ED, patient is found to be afebrile and saturating low 90s on room air with mild tachypnea, mild tachycardia, and stable blood pressure.  Labs are most notable for creatinine 1.58, normal WBC, normal CK, and urine nitrites.  Urine was sent for culture and the patient was given 500 mL of LR and 1 g IV Rocephin .  Review of Systems:  All other systems reviewed and apart from HPI, are negative.  Past Medical History:  Diagnosis Date   Adenocarcinoma of left breast 04/16/2012   Stage II (T2 N0 M0) left-sided adenocarcinoma breast diagnosed in 1990 by Dr. Floria and treated by Dr. Camellia Economy on NSAB PPD-19 with CMF followed by 5 years of tamoxifen and she remains disease free thus far.    AKI (acute kidney injury) (HCC)    Breast cancer (HCC)     left breast/mastectomy/dx approx1990   COPD (chronic obstructive pulmonary disease) (HCC)    Diarrhea 01/02/2022   Gall bladder stones    Grave's disease    Headache    Migraines   High triglycerides    History of kidney stones    small stone  rt    Hyperthyroidism    Nerve compression    pinched in neck   Pneumonia 1990   PONV (postoperative nausea and vomiting)    Sepsis due to Escherichia coli with acute renal failure without septic shock [facility])     Past Surgical History:  Procedure Laterality Date   CARPAL TUNNEL RELEASE     rt. hand   cataract surgery     bilateral   CHOLECYSTECTOMY N/A 05/05/2021   Procedure: LAPAROSCOPIC CHOLECYSTECTOMY;  Surgeon: Signe Mitzie LABOR, MD;  Location: [facility] OR;  Service: General;  Laterality: N/A;   COLONOSCOPY     EYE SURGERY     LAPAROSCOPIC NEPHRECTOMY Left 03/25/2019   Procedure: LAPAROSCOPIC RADICAL NEPHRECTOMY;  Surgeon: Devere Lonni Righter, MD;  Location: [facility] ORS;  Service: Urology;  Laterality: Left;   MASTECTOMY     left.   NEPHRECTOMY Left    NODE DISSECTION Left 09/11/2019   Procedure: Node Dissection;  Surgeon: Kerrin Elspeth BROCKS, MD;  Location: [facility] OR;  Service: Thoracic;  Laterality: Left;   THORACOTOMY/LOBECTOMY Left 09/11/2019   Procedure: Thoracotomy/Lobectomy Left Upper Lobe;  Surgeon: Kerrin,  Elspeth BROCKS, MD;  Location: [facility] OR;  Service: Thoracic;  Laterality: Left;   TONSILLECTOMY      Social History:   reports that she quit smoking about 4 years ago. Her smoking use included cigarettes. She started smoking about 64 years ago. She has a 60 pack-year smoking history. She has never used smokeless tobacco. She reports that she does not drink alcohol  and does not use drugs.  Allergies  Allergen Reactions   Chantix [Varenicline] Other (See Comments)    Depression    Dicyclomine  Hcl Itching    Family History  Problem Relation Age of Onset   Cancer Father        lung cancer   Cancer Brother        colon  cancer   Cancer Other        lung cancer/died age 69     Prior to Admission medications   Medication Sig Start Date End Date Taking? Authorizing Provider  acetaminophen  (TYLENOL ) 500 MG tablet Take 500 mg by mouth every 8 (eight) hours as needed for moderate pain.    [provider]  atorvastatin  (LIPITOR) 20 MG tablet TAKE 1 TABLET EVERY DAY FOR CHOLESTEROL 01/23/24   Zollie Lowers, MD  Calcium -Vitamin D ([facility] CALCIUM -600/VIT D PO) Take 1 capsule by mouth in the morning and at bedtime.    [provider]  ferrous sulfate  325 (65 FE) MG EC tablet Take 325 mg by mouth daily.    [provider]  hydroxypropyl methylcellulose / hypromellose (ISOPTO TEARS / GONIOVISC) 2.5 % ophthalmic solution Place 1 drop into both eyes 3 (three) times daily as needed for dry eyes.     [provider]  levothyroxine  (SYNTHROID ) 88 MCG tablet TAKE 1 TABLET EVERY DAY BEFORE BREAKFAST 04/27/24   Zollie Lowers, MD  meclizine  (ANTIVERT ) 25 MG tablet TAKE ONE TABLET BY MOUTH DAILY AS NEEDED FOR DIZZINESS FOR NAUSEA 03/11/24   Zollie Lowers, MD  Multiple Vitamins-Minerals (CENTRUM SILVER ULTRA WOMENS) TABS Take 1 tablet by mouth daily.    [provider]  traMADol  (ULTRAM ) 50 MG tablet Take 1 tablet (50 mg total) by mouth 4 (four) times daily as needed for moderate pain (pain score 4-6). 04/16/24   Zollie Lowers, MD    Physical Exam: Vitals:   04/30/24 1451 04/30/24 1500 04/30/24 1530 04/30/24 1900  BP:  122/79 125/73 126/74  Pulse:  99 96 94  Resp:    (!) 25  Temp:      TempSrc:      SpO2:  91% 91% 91%  Weight: 62.8 kg     Height: 5' 5 (1.651 m)        Constitutional: NAD, calm  Eyes: PERTLA, lids and conjunctivae normal ENMT: Mucous membranes are moist. Posterior pharynx clear of any exudate or lesions.   Neck: supple, no masses  Respiratory: no wheezing, no crackles. No accessory muscle use.  Cardiovascular: S1 & S2 heard, regular rate and rhythm. No  extremity edema.   Abdomen: No tenderness, soft. Bowel sounds active.  Musculoskeletal: no clubbing / cyanosis. No joint deformity upper and lower extremities. Left knee tender along medial joint line.   Skin: no significant rashes, lesions, ulcers. Warm, dry, well-perfused. Neurologic: CN 2-12 grossly intact aside from gross hearing deficit. Moving all extremities. Alert and oriented to person, place, and situation.  Psychiatric: Pleasant. Cooperative.    Labs and Imaging on Admission: I have personally reviewed following labs and imaging studies  CBC: Recent Labs  Lab  04/30/24 1648  WBC 7.6  NEUTROABS 6.2  HGB 14.0  HCT 45.3  MCV 100.0  PLT 218   Basic Metabolic Panel: Recent Labs  Lab 04/30/24 1648  NA 141  K 4.0  CL 106  CO2 20*  GLUCOSE 108*  BUN 43*  CREATININE 1.58*  CALCIUM  9.2   GFR: Estimated Creatinine Clearance: 25.1 mL/min (A) (by C-G formula based on SCr of 1.58 mg/dL (H)). Liver Function Tests: Recent Labs  Lab 04/30/24 1648  AST 34  ALT 25  ALKPHOS 108  BILITOT 1.1  PROT 7.4  ALBUMIN  3.1*   No results for input(s): LIPASE, AMYLASE in the last 168 hours. No results for input(s): AMMONIA in the last 168 hours. Coagulation Profile: No results for input(s): INR, PROTIME in the last 168 hours. Cardiac Enzymes: Recent Labs  Lab 04/30/24 1648  CKTOTAL 64   BNP (last 3 results) No results for input(s): PROBNP in the last 8760 hours. HbA1C: No results for input(s): HGBA1C in the last 72 hours. CBG: No results for input(s): GLUCAP in the last 168 hours. Lipid Profile: No results for input(s): CHOL, HDL, LDLCALC, TRIG, CHOLHDL, LDLDIRECT in the last 72 hours. Thyroid  Function Tests: No results for input(s): TSH, T4TOTAL, FREET4, T3FREE, THYROIDAB in the last 72 hours. Anemia Panel: No results for input(s): VITAMINB12, FOLATE, FERRITIN, TIBC, IRON, RETICCTPCT in the last 72 hours. Urine  analysis:    Component Value Date/Time   COLORURINE YELLOW 04/30/2024 1755   APPEARANCEUR CLOUDY (A) 04/30/2024 1755   LABSPEC 1.015 04/30/2024 1755   PHURINE 6.0 04/30/2024 1755   GLUCOSEU NEGATIVE 04/30/2024 1755   HGBUR LARGE (A) 04/30/2024 1755   BILIRUBINUR NEGATIVE 04/30/2024 1755   KETONESUR 5 (A) 04/30/2024 1755   PROTEINUR 100 (A) 04/30/2024 1755   NITRITE POSITIVE (A) 04/30/2024 1755   LEUKOCYTESUR LARGE (A) 04/30/2024 1755   Sepsis Labs: @LABRCNTIP (procalcitonin:4,lacticidven:4) )No results found for this or any previous visit (from the past 240 hours).   Radiological Exams on Admission: CT CHEST ABDOMEN PELVIS WO CONTRAST Result Date: 04/30/2024 CLINICAL DATA:  Unwitnessed fall several days ago. EXAM: CT CHEST, ABDOMEN AND PELVIS WITHOUT CONTRAST TECHNIQUE: Multidetector CT imaging of the chest, abdomen and pelvis was performed following the standard protocol without IV contrast. RADIATION DOSE REDUCTION: This exam was performed according to the departmental dose-optimization program which includes automated exposure control, adjustment of the mA and/or kV according to patient size and/or use of iterative reconstruction technique. COMPARISON:  November 15, 2023.  April 26, 2022. FINDINGS: CT CHEST FINDINGS Cardiovascular: Atherosclerosis of thoracic aorta without aneurysm formation. Normal cardiac size. No pericardial effusion. Coronary artery calcifications are noted. Mediastinum/Nodes: No enlarged mediastinal, hilar, or axillary lymph nodes. Thyroid  gland, trachea, and esophagus demonstrate no significant findings. Lungs/Pleura: No pneumothorax or pleural effusion is noted. Minimal right posterior basilar subsegmental atelectasis. Left lingular scarring or subsegmental atelectasis is noted. Musculoskeletal: No chest wall mass or suspicious bone lesions identified. CT ABDOMEN PELVIS FINDINGS Hepatobiliary: No focal liver abnormality is seen. Status post cholecystectomy. No biliary  dilatation. Pancreas: Unremarkable. No pancreatic ductal dilatation or surrounding inflammatory changes. Spleen: Normal in size without focal abnormality. Adrenals/Urinary Tract: Adrenal glands appear normal. Status post left nephrectomy. Nonobstructive right nephrolithiasis is noted. Mild right hydroureteronephrosis is noted without obstructing calculus. Urinary bladder is unremarkable. Stomach/Bowel: Stomach is within normal limits. Appendix appears normal. No evidence of bowel wall thickening, distention, or inflammatory changes. Sigmoid diverticulosis without inflammation. Vascular/Lymphatic: Aortic atherosclerosis. No enlarged abdominal or pelvic lymph nodes. Reproductive: Uterus  and bilateral adnexa are unremarkable. Other: No ascites or hernia is noted. Musculoskeletal: No acute or significant osseous findings. IMPRESSION: 1. No definite traumatic injury seen in the chest, abdomen or pelvis. 2. Coronary artery calcifications are noted suggesting coronary artery disease. 3. Status post left nephrectomy. 4. Nonobstructive right nephrolithiasis. Mild right hydroureteronephrosis is noted without obstructing calculus. 5. Sigmoid diverticulosis without inflammation. 6. Aortic atherosclerosis. Aortic Atherosclerosis (ICD10-I70.0). Electronically Signed   By: Lynwood Landy Raddle M.D.   On: 04/30/2024 16:52   CT Head Wo Contrast Result Date: 04/30/2024 CLINICAL DATA:  Fall EXAM: CT HEAD WITHOUT CONTRAST CT CERVICAL SPINE WITHOUT CONTRAST TECHNIQUE: Multidetector CT imaging of the head and cervical spine was performed following the standard protocol without intravenous contrast. Multiplanar CT image reconstructions of the cervical spine were also generated. RADIATION DOSE REDUCTION: This exam was performed according to the departmental dose-optimization program which includes automated exposure control, adjustment of the mA and/or kV according to patient size and/or use of iterative reconstruction technique. COMPARISON:   Chest CT 11/15/2023 FINDINGS: CT HEAD FINDINGS Brain: No acute territorial infarction, hemorrhage or intracranial mass. Atrophy and moderate chronic small vessel ischemic changes of the white matter. Nonenlarged ventricles. Probable chronic lacunar infarcts in the right thalamus and basal ganglia. Vascular: No hyperdense vessels.  Carotid vascular calcification Skull: Normal. Negative for fracture or focal lesion. Sinuses/Orbits: No acute finding. Other: None CT CERVICAL SPINE FINDINGS Alignment: Straightening of the cervical spine. Trace anterolisthesis C4 on C5. Facet alignment is within normal limits. Skull base and vertebrae: No acute fracture. No primary bone lesion or focal pathologic process. Soft tissues and spinal canal: No prevertebral fluid or swelling. No visible canal hematoma. Disc levels: Multilevel degenerative changes. Advanced disc space narrowing C5-C6 and C6-C7. Multilevel facet degenerative changes with foraminal narrowing. Upper chest: Apical scarring.  Mild emphysema Other: None IMPRESSION: No CT evidence for acute intracranial abnormality. Atrophy and chronic small vessel ischemic changes of the white matter. Straightening of the cervical spine with degenerative changes. No acute osseous abnormality. Electronically Signed   By: Luke Bun M.D.   On: 04/30/2024 16:48   CT Cervical Spine Wo Contrast Result Date: 04/30/2024 CLINICAL DATA:  Fall EXAM: CT HEAD WITHOUT CONTRAST CT CERVICAL SPINE WITHOUT CONTRAST TECHNIQUE: Multidetector CT imaging of the head and cervical spine was performed following the standard protocol without intravenous contrast. Multiplanar CT image reconstructions of the cervical spine were also generated. RADIATION DOSE REDUCTION: This exam was performed according to the departmental dose-optimization program which includes automated exposure control, adjustment of the mA and/or kV according to patient size and/or use of iterative reconstruction technique.  COMPARISON:  Chest CT 11/15/2023 FINDINGS: CT HEAD FINDINGS Brain: No acute territorial infarction, hemorrhage or intracranial mass. Atrophy and moderate chronic small vessel ischemic changes of the white matter. Nonenlarged ventricles. Probable chronic lacunar infarcts in the right thalamus and basal ganglia. Vascular: No hyperdense vessels.  Carotid vascular calcification Skull: Normal. Negative for fracture or focal lesion. Sinuses/Orbits: No acute finding. Other: None CT CERVICAL SPINE FINDINGS Alignment: Straightening of the cervical spine. Trace anterolisthesis C4 on C5. Facet alignment is within normal limits. Skull base and vertebrae: No acute fracture. No primary bone lesion or focal pathologic process. Soft tissues and spinal canal: No prevertebral fluid or swelling. No visible canal hematoma. Disc levels: Multilevel degenerative changes. Advanced disc space narrowing C5-C6 and C6-C7. Multilevel facet degenerative changes with foraminal narrowing. Upper chest: Apical scarring.  Mild emphysema Other: None IMPRESSION: No CT evidence for acute intracranial  abnormality. Atrophy and chronic small vessel ischemic changes of the white matter. Straightening of the cervical spine with degenerative changes. No acute osseous abnormality. Electronically Signed   By: Luke Bun M.D.   On: 04/30/2024 16:48    EKG: Independently reviewed. Sinus rhythm, incomplete RBBB and LAFB, LVH.   Assessment/Plan   1. Generalized weakness  - CK is normal in the ED, no acute findings seen on head CT, TSH and FT4 were normal 2 weeks ago  - Check ammonia, B12, and RPR, treat dehydration with IVF and possible UTI with Rocephin , consult PT   2. CKD 3A  - SCr is 1.58, up from 1.15 in March in setting of recent anorexia  - Continue IVF hydration overnight, renally-dose medications, repeat chem panel in am   3. COPD  - Not in exacerbation   - Short-acting bronchodilators as-needed    4. Hypothyroidism  - Synthroid     5. HLD  - Lipitor     DVT prophylaxis: sq heparin   Code Status: Full  Level of Care: Level of care: Med-Surg Family Communication: Daughter at bedside  Disposition Plan:  Patient is from: home  Anticipated d/c is to: TBD Anticipated d/c date is: 8/29 or 05/02/24  Patient currently: Pending additional labs, urine culture, response to treatment, PT evaluation  Consults called: None  Admission status: Observation     Evalene GORMAN Sprinkles, MD [facility]  04/30/2024, 7:35 PM

## 2024-05-01 DIAGNOSIS — R319 Hematuria, unspecified: Secondary | ICD-10-CM | POA: Diagnosis not present

## 2024-05-01 DIAGNOSIS — J449 Chronic obstructive pulmonary disease, unspecified: Secondary | ICD-10-CM | POA: Diagnosis not present

## 2024-05-01 DIAGNOSIS — N179 Acute kidney failure, unspecified: Secondary | ICD-10-CM | POA: Diagnosis not present

## 2024-05-01 DIAGNOSIS — E039 Hypothyroidism, unspecified: Secondary | ICD-10-CM | POA: Diagnosis not present

## 2024-05-01 DIAGNOSIS — N390 Urinary tract infection, site not specified: Secondary | ICD-10-CM | POA: Diagnosis not present

## 2024-05-01 DIAGNOSIS — R5381 Other malaise: Secondary | ICD-10-CM | POA: Diagnosis not present

## 2024-05-01 DIAGNOSIS — N1831 Chronic kidney disease, stage 3a: Secondary | ICD-10-CM | POA: Diagnosis not present

## 2024-05-01 DIAGNOSIS — E860 Dehydration: Secondary | ICD-10-CM | POA: Diagnosis not present

## 2024-05-01 LAB — BASIC METABOLIC PANEL WITH GFR
Anion gap: 14 (ref 5–15)
BUN: 40 mg/dL — ABNORMAL HIGH (ref 8–23)
CO2: 23 mmol/L (ref 22–32)
Calcium: 9 mg/dL (ref 8.9–10.3)
Chloride: 105 mmol/L (ref 98–111)
Creatinine, Ser: 1.48 mg/dL — ABNORMAL HIGH (ref 0.44–1.00)
GFR, Estimated: 35 mL/min — ABNORMAL LOW (ref >60)
Glucose, Bld: 106 mg/dL — ABNORMAL HIGH (ref 70–99)
Potassium: 3.7 mmol/L (ref 3.5–5.1)
Sodium: 142 mmol/L (ref 135–145)

## 2024-05-01 LAB — CBC
HCT: 40.7 % (ref 36.0–46.0)
Hemoglobin: 12.9 g/dL (ref 12.0–15.0)
MCH: 30.1 pg (ref 26.0–34.0)
MCHC: 31.7 g/dL (ref 30.0–36.0)
MCV: 95.1 fL (ref 80.0–100.0)
Platelets: 212 K/uL (ref 150–400)
RBC: 4.28 MIL/uL (ref 3.87–5.11)
RDW: 13.8 % (ref 11.5–15.5)
WBC: 6.1 K/uL (ref 4.0–10.5)
nRBC: 0 % (ref 0.0–0.2)

## 2024-05-01 LAB — RPR: RPR Ser Ql: NONREACTIVE

## 2024-05-01 MED ORDER — FENTANYL CITRATE PF 50 MCG/ML IJ SOSY: 12.5000 ug | PREFILLED_SYRINGE | INTRAMUSCULAR | Status: DC | PRN

## 2024-05-01 MED ORDER — SODIUM CHLORIDE 0.9 % IV SOLN: INTRAVENOUS | Status: AC

## 2024-05-01 NOTE — Plan of Care (Signed)

## 2024-05-01 NOTE — Evaluation (Signed)
 Physical Therapy Evaluation Patient Details Name: [name] DOB: May 08, 1941 Today's Date: 05/01/2024  History of Present Illness  [name] is a 83 y.o. female with medical history significant for lung cancer status post left upper lobectomy, renal cell carcinoma status post left nephrectomy, remote breast cancer, COPD, hypothyroidism, and hyperlipidemia, now presenting with left hip and left knee pain after fall, generalized weakness, and loss of appetite.     Patient lives alone, is typically able to perform her ADLs independently and ambulate with a walker, but has not been getting out of bed since 04/27/2024.  She seemed to be in her usual state on 04/26/2024 when family saw her, but they found her on the ground the following day.  She was complaining of pain in her left hip and left knee.  She has not been eating or drinking much.  She denies chest pain or abdominal pain, cough or shortness of breath, dysuria or flank pain.  Family at the bedside reports that she did complain of some pain while urinating earlier.   Clinical Impression  Patient appears agreeable to OT/PT co-evaluation. Patient's daughter is present during session and provides patient's history as patient demonstrates some cognitive change. Patient's daughter reports patient is usually modified independent with mobility and iADLs. Reports she's mostly independent with ADLs besides bathing. On this date, patient requires max assist with all mobility. Patient is greatly limited due to pain which is preventing her from bearing weight into LLE. Max/total assist with transfer from bed to chair. Patient tolerated sitting in recliner at end of session with daughter present, chair alarm activated and call button within reach. Patient will benefit from continued skilled physical therapy acutely and in recommended venue in order to address current deficits to improve current level of function.        If plan is  discharge home, recommend the following: A lot of help with walking and/or transfers;A lot of help with bathing/dressing/bathroom;Assist for transportation;Help with stairs or ramp for entrance;Assistance with cooking/housework;Supervision due to cognitive status   Can travel by private vehicle        Equipment Recommendations None recommended by PT  Recommendations for Other Services       Functional Status Assessment Patient has had a recent decline in their functional status and demonstrates the ability to make significant improvements in function in a reasonable and predictable amount of time.     Precautions / Restrictions Precautions Precautions: Fall Recall of Precautions/Restrictions: Impaired Restrictions Weight Bearing Restrictions Per Provider Order: No      Mobility  Bed Mobility Overal bed mobility: Needs Assistance Bed Mobility: Supine to Sit     Supine to sit: Max assist, Total assist     General bed mobility comments: pt demo little assist throughout all bed mobility, unsure if due to inc pain or dec processing cognitively, max/total with HOB flat    Transfers Overall transfer level: Needs assistance Equipment used: Rolling walker (2 wheels) Transfers: Sit to/from Stand, Bed to chair/wheelchair/BSC Sit to Stand: Max assist   Step pivot transfers: Max assist       General transfer comment: Pt demo little active assist with transfers, verbal step by step cueing required, OT/PT having to shift pt weight and physically progress LLE during transfer due to inc pain and weakness.    Ambulation/Gait Ambulation/Gait assistance: Max assist Gait Distance (Feet): 2 Feet Assistive device: Rolling walker (2 wheels) Gait Pattern/deviations: Step-to pattern, Knee flexed in stance - left, Knee  flexed in stance - right, Decreased weight shift to left, Decreased step length - right, Decreased step length - left, Decreased stance time - left Gait velocity: Dec      General Gait Details: Limited to a few side steps to L with max assist and RW at bedside, very slow, labored, and painful movement, verbal step by step cueing required, OT/PT having to shift pt weight and physically progress LLE  Stairs            Wheelchair Mobility     Tilt Bed    Modified Rankin (Stroke Patients Only)       Balance Overall balance assessment: Needs assistance Sitting-balance support: No upper extremity supported, Feet supported Sitting balance-Leahy Scale: Poor Sitting balance - Comments: Seated EOB, heavy posterior lean at times Postural control: Posterior lean, Right lateral lean Standing balance support: Bilateral upper extremity supported, During functional activity, Reliant on assistive device for balance Standing balance-Leahy Scale: Poor Standing balance comment: w/ RW and max A         Pertinent Vitals/Pain Pain Assessment Pain Assessment: Faces Faces Pain Scale: Hurts whole lot Pain Location: L leg Pain Descriptors / Indicators: Grimacing, Guarding, Moaning Pain Intervention(s): Limited activity within patient's tolerance, Repositioned, Monitored during session    Home Living Family/patient expects to be discharged to:: Private residence Living Arrangements: Alone Available Help at Discharge: Family;Available PRN/intermittently Type of Home: House Home Access: Other (comment) (pt daughter reports one 3-4 inch threshold into doorway)       Home Layout: Two level;Able to live on main level with bedroom/bathroom Home Equipment: Rolling Walker (2 wheels);Rollator (4 wheels);Cane - quad;Cane - single point;BSC/3in1;Shower seat;Toilet riser;Grab bars - tub/shower;Other (comment) Additional Comments: Due to pt current cognitive state, pt daughter provides history    Prior Function Prior Level of Function : Needs assist;History of Falls (last six months)       Physical Assist : ADLs (physical)   ADLs (physical): IADLs;Bathing Mobility  Comments: pt daughter reports pt usually ambulates around home without AD and uses quad cane in community due to family requiring her to, pt does not drive, reports 2 falls in last 6 months ADLs Comments: Independent with all ADLs other than bathing, family present while she showers, independent  with iADLs.     Extremity/Trunk Assessment   Upper Extremity Assessment Upper Extremity Assessment: Defer to OT evaluation    Lower Extremity Assessment Lower Extremity Assessment: Generalized weakness;LLE deficits/detail LLE Deficits / Details: Increased pain throughout entire LLE. Active motions limited due to pain. PROM very painful, weight shifting into LLE painful. LLE: Unable to fully assess due to pain LLE Coordination: decreased gross motor;decreased fine motor    Cervical / Trunk Assessment Cervical / Trunk Assessment: Kyphotic  Communication   Communication Communication: No apparent difficulties Factors Affecting Communication: Hearing impaired;Difficulty expressing self;Other (comment) (Pt daughter reports pt has hearing aids but refuses to wear them)    Cognition Arousal: Alert Behavior During Therapy: Flat affect       PT - Cognition Comments: Alert to self, difficulty with expressing herself during some questions. Pt daughter reports this is not her baseline, reports she sometimes has issue with word finding at baseline Following commands: Impaired Following commands impaired: Follows one step commands inconsistently     Cueing Cueing Techniques: Verbal cues, Gestural cues, Tactile cues     General Comments General comments (skin integrity, edema, etc.): Pt urinated on the floor during initial sit to stand. Floor dried with towel before continuing transfer  to chair.    Exercises     Assessment/Plan    PT Assessment Patient needs continued PT services;All further PT needs can be met in the next venue of care  PT Problem List Decreased strength;Decreased range of  motion;Decreased activity tolerance;Decreased balance;Decreased mobility;Pain       PT Treatment Interventions DME instruction;Balance training;Gait training;Functional mobility training;Stair training;Therapeutic activities;Therapeutic exercise;Patient/family education    PT Goals (Current goals can be found in the Care Plan section)  Acute Rehab PT Goals Patient Stated Goal: Return home PT Goal Formulation: With patient/family Time For Goal Achievement: 05/15/24 Potential to Achieve Goals: Good    Frequency Min 3X/week     Co-evaluation PT/OT/SLP Co-Evaluation/Treatment: Yes Reason for Co-Treatment: To address functional/ADL transfers PT goals addressed during session: Mobility/safety with mobility OT goals addressed during session: ADL's and self-care       AM-PAC PT 6 Clicks Mobility  Outcome Measure Help needed turning from your back to your side while in a flat bed without using bedrails?: A Lot Help needed moving from lying on your back to sitting on the side of a flat bed without using bedrails?: A Lot Help needed moving to and from a bed to a chair (including a wheelchair)?: A Lot Help needed standing up from a chair using your arms (e.g., wheelchair or bedside chair)?: A Lot Help needed to walk in hospital room?: A Lot Help needed climbing 3-5 steps with a railing? : A Lot 6 Click Score: 12    End of Session Equipment Utilized During Treatment: Gait belt Activity Tolerance: Patient limited by fatigue;Patient limited by pain Patient left: in chair;with call bell/phone within reach;with chair alarm set;with family/visitor present   PT Visit Diagnosis: Unsteadiness on feet (R26.81);History of falling (Z91.81);Muscle weakness (generalized) (M62.81);Pain;Other abnormalities of gait and mobility (R26.89);Difficulty in walking, not elsewhere classified (R26.2) Pain - Right/Left: Left Pain - part of body: Leg;Knee;Ankle and joints of foot    Time: 9053-8981 PT Time  Calculation (min) (ACUTE ONLY): 32 min   Charges:   PT Evaluation $PT Eval Moderate Complexity: 1 Mod   PT General Charges $$ ACUTE PT VISIT: 1 Visit         11:59 AM, 05/01/24 Rosaria Settler, PT, DPT [place] with [facility]

## 2024-05-01 NOTE — [place] FL2 (Signed)
 [place]  MEDICAID FL2 LEVEL OF CARE FORM     IDENTIFICATION  Patient Name: [name] Birthdate: [birth year] Sex: female Admission Date (Current Location): 04/30/2024  [facility] and [place] Number:  [name] and Address:  [facility],  [address], [place] [address]      Provider Number: [id number]  Attending Physician Name and Address:  Ricky Fines, MD  Relative Name and Phone Number:  Mabe,Karen (Daughter)  [phone]    Current Level of Care: Hospital Recommended Level of Care: Skilled Nursing Facility Prior Approval Number:    Date Approved/Denied:   PASRR Number: [id number]  Discharge Plan: SNF    Current Diagnoses: Patient Active Problem List   Diagnosis Date Noted   Physical debility 04/30/2024   CKD stage 3a, GFR 45-59 ml/min (HCC) 04/30/2024   COPD (chronic obstructive pulmonary disease) (HCC)    Labyrinthitis of both ears 11/28/2023   Mixed hyperlipidemia 01/23/2022   Pain of upper abdomen 01/04/2022   Hx of iron deficiency anemia 01/04/2022   Abnormal CT scan, liver 01/02/2022   DOE (dyspnea on exertion) 03/20/2021   E. coli UTI    Hypothyroidism 10/04/2020   Iron deficiency anemia 12/11/2019   Acute dehydration 09/29/2019   Lung cancer (HCC) 09/11/2019   Clear cell renal cell carcinoma, left (HCC) 04/13/2019   Adenocarcinoma of left breast 04/16/2012    Orientation RESPIRATION BLADDER Height & Weight     Self  Normal Incontinent Weight: 60.7 kg Height:  5' 6 (167.6 cm)  BEHAVIORAL SYMPTOMS/MOOD NEUROLOGICAL BOWEL NUTRITION STATUS      Continent Diet (regular)  AMBULATORY STATUS COMMUNICATION OF NEEDS Skin   Extensive Assist Verbally Normal                       Personal Care Assistance Level of Assistance  Bathing, Dressing, Feeding Bathing Assistance: Limited assistance Feeding assistance: Independent Dressing Assistance: Limited assistance     Functional Limitations Info  Sight, Hearing,  Speech Sight Info: Impaired Hearing Info: Adequate Speech Info: Adequate    SPECIAL CARE FACTORS FREQUENCY  PT (By licensed PT), OT (By licensed OT)     PT Frequency: 5 times weekly OT Frequency: 5 times weekly            Contractures Contractures Info: Not present    Additional Factors Info  Code Status, Allergies Code Status Info: FULL Allergies Info: Chantix (varenicline) and Dicyclomine  Hcl           Current Medications (05/01/2024):  This is the current hospital active medication list Current Facility-Administered Medications  Medication Dose Route Frequency Provider Last Rate Last Admin   0.9 %  sodium chloride  infusion   Intravenous Continuous Ricky Fines, MD 75 mL/hr at 05/01/24 0915 New Bag at 05/01/24 0915   acetaminophen  (TYLENOL ) tablet 650 mg  650 mg Oral Q6H PRN Opyd, Timothy S, MD       Or   acetaminophen  (TYLENOL ) suppository 650 mg  650 mg Rectal Q6H PRN Opyd, Timothy S, MD       atorvastatin  (LIPITOR) tablet 20 mg  20 mg Oral Daily Opyd, Timothy S, MD   20 mg at 05/01/24 0915   bisacodyl  (DULCOLAX) EC tablet 5 mg  5 mg Oral Daily PRN Opyd, Timothy S, MD       cefTRIAXone  (ROCEPHIN ) 1 g in sodium chloride  0.9 % 100 mL IVPB  1 g Intravenous Q24H Opyd, Timothy S, MD       fentaNYL  (  SUBLIMAZE ) injection 12.5-50 mcg  12.5-50 mcg Intravenous Q2H PRN Ricky Fines, MD       heparin  injection 5,000 Units  5,000 Units Subcutaneous Q8H Opyd, Timothy S, MD   5,000 Units at 05/01/24 9473   levothyroxine  (SYNTHROID ) tablet 88 mcg  88 mcg Oral Q0600 Opyd, Timothy S, MD   88 mcg at 05/01/24 9475   oxyCODONE  (Oxy IR/ROXICODONE ) immediate release tablet 2.5-5 mg  2.5-5 mg Oral Q4H PRN Opyd, Timothy S, MD       prochlorperazine  (COMPAZINE ) injection 5 mg  5 mg Intravenous Q6H PRN Opyd, Evalene RAMAN, MD       senna-docusate (Senokot-S) tablet 1 tablet  1 tablet Oral QHS PRN Opyd, Timothy S, MD         Discharge Medications: Please see discharge summary for a list of  discharge medications.  Relevant Imaging Results:  Relevant Lab Results:   Additional Information SSN: [id number]  Sharlyne Stabs, RN

## 2024-05-01 NOTE — TOC Initial Note (Signed)
 Transition of Care [facility]) - Initial/Assessment Note    Patient Details  Name: [name] MRN: [id number] Date of Birth: 04-Sep-1940  [facility] CM/SW Contact:    Sharlyne Stabs, RN Phone Number: 05/01/2024, 1:27 PM  Clinical Narrative:     Patient admitted with physical debility. Pt is recommending SNF. CM at the bedside with [facility] she is very tearful. This is all new to her, CM reviewed OBS status and PT eval. At Baseline patient is ambulatory in the home. Per Darice she has a walker and should use it but does not. Gave star ratings and explained, discharge plan going to SNF as well and LTC for the future. She is agreeable to SNF, requested [facility] [place] [facility]. FL2 completed and sent out for bed offers. TOC following to start INS Auth.               Expected Discharge Plan: Skilled Nursing Facility Barriers to Discharge: SNF Pending bed offer, Continued Medical Work up   Patient Goals and CMS Choice Patient states their goals for this hospitalization and ongoing recovery are:: family agreeable to SNF CMS Medicare.gov Compare Post Acute Care list provided to:: Patient Represenative (must comment) Choice offered to / list presented to : Adult Children      Expected Discharge Plan and Services       Living arrangements for the past 2 months: Single Family Home          Prior Living Arrangements/Services Living arrangements for the past 2 months: Single Family Home Lives with:: Self Patient language and need for interpreter reviewed:: Yes        Need for Family Participation in Patient Care: Yes (Comment) Care giver support system in place?: Yes (comment) Current home services: DME Criminal Activity/Legal Involvement Pertinent to Current Situation/Hospitalization: No - Comment as needed  Activities of Daily Living   ADL Screening (condition at time of admission) Independently performs ADLs?: No Does the patient have a NEW difficulty  with bathing/dressing/toileting/self-feeding that is expected to last >3 days?: Yes (Initiates electronic notice to provider for possible OT consult) Does the patient have a NEW difficulty with getting in/out of bed, walking, or climbing stairs that is expected to last >3 days?: Yes (Initiates electronic notice to provider for possible PT consult) Does the patient have a NEW difficulty with communication that is expected to last >3 days?: Yes (Initiates electronic notice to provider for possible SLP consult) Is the patient deaf or have difficulty hearing?: Yes Does the patient have difficulty seeing, even when wearing glasses/contacts?: No Does the patient have difficulty concentrating, remembering, or making decisions?: Yes  Permission Sought/Granted            Permission granted to share info w Relationship: Daughter     Emotional Assessment     Affect (typically observed): Unable to Assess Orientation: : Oriented to Self Alcohol  / Substance Use: Not Applicable Psych Involvement: No (comment)  Admission diagnosis:  General weakness [R53.1] Physical debility [R53.81] Urinary tract infection with hematuria, site unspecified [N39.0, R31.9] Patient Active Problem List   Diagnosis Date Noted   Physical debility 04/30/2024   CKD stage 3a, GFR 45-59 ml/min (HCC) 04/30/2024   COPD (chronic obstructive pulmonary disease) (HCC)    Labyrinthitis of both ears 11/28/2023   Mixed hyperlipidemia 01/23/2022   Pain of upper abdomen 01/04/2022   Hx of iron deficiency anemia 01/04/2022   Abnormal CT scan, liver 01/02/2022   DOE (dyspnea on exertion)  03/20/2021   E. coli UTI    Hypothyroidism 10/04/2020   Iron deficiency anemia 12/11/2019   Acute dehydration 09/29/2019   Lung cancer (HCC) 09/11/2019   Clear cell renal cell carcinoma, left (HCC) 04/13/2019   Adenocarcinoma of left breast 04/16/2012   PCP:  Zollie Lowers, MD Pharmacy:   [facility] - [address], [place]  - [address] [place] [address] Phone: [phone] Fax: [phone]  [facility] - [place], [place] - [address] [place] [address] Phone: [phone] Fax: [phone]     Social Drivers of Health (SDOH) Social History: SDOH Screenings   Food Insecurity: No Food Insecurity (04/30/2024)  Housing: Low Risk  (05/01/2024)  Transportation Needs: No Transportation Needs (04/30/2024)  Utilities: Not At Risk (04/30/2024)  Alcohol  Screen: Low Risk  (09/29/2021)  Depression (PHQ2-9): Low Risk  (04/16/2024)  Financial Resource Strain: Low Risk  (12/05/2022)  Physical Activity: Unknown (12/05/2022)  Social Connections: Moderately Isolated (04/30/2024)  Stress: No Stress Concern Present (12/05/2022)  Tobacco Use: Medium Risk (04/30/2024)   SDOH Interventions:    Readmission Risk Interventions     No data to display

## 2024-05-01 NOTE — Plan of Care (Signed)
  Problem: Acute Rehab PT Goals(only PT should resolve) Goal: Pt Will Go Supine/Side To Sit Outcome: Progressing Flowsheets (Taken 05/01/2024 1200) Pt will go Supine/Side to Sit: with moderate assist Goal: Patient Will Transfer Sit To/From Stand Outcome: Progressing Flowsheets (Taken 05/01/2024 1200) Patient will transfer sit to/from stand: with moderate assist Goal: Pt Will Transfer Bed To Chair/Chair To Bed Outcome: Progressing Flowsheets (Taken 05/01/2024 1200) Pt will Transfer Bed to Chair/Chair to Bed: with mod assist Goal: Pt Will Ambulate Outcome: Progressing Flowsheets (Taken 05/01/2024 1200) Pt will Ambulate:  10 feet  with moderate assist  with rolling walker Goal: Pt Will Go Up/Down Stairs Outcome: Progressing Flowsheets (Taken 05/01/2024 1200) Pt will Go Up / Down Stairs:  1-2 stairs  with rolling walker  with moderate assist    12:01 PM, 05/01/24 Kathleen Likins Powell-Butler, PT, DPT [place] with [facility]

## 2024-05-01 NOTE — Evaluation (Signed)
 Occupational Therapy Evaluation Patient Details Name: [name] MRN: [id number] DOB: June 19, 1941 Today's Date: 05/01/2024   History of Present Illness   [name] is a 83 y.o. female with medical history significant for lung cancer status post left upper lobectomy, renal cell carcinoma status post left nephrectomy, remote breast cancer, COPD, hypothyroidism, and hyperlipidemia, now presenting with left hip and left knee pain after fall, generalized weakness, and loss of appetite.     Patient lives alone, is typically able to perform her ADLs independently and ambulate with a walker, but has not been getting out of bed since 04/27/2024.  She seemed to be in her usual state on 04/26/2024 when family saw her, but they found her on the ground the following day.  She was complaining of pain in her left hip and left knee.  She has not been eating or drinking much.  She denies chest pain or abdominal pain, cough or shortness of breath, dysuria or flank pain.  Family at the bedside reports that she did complain of some pain while urinating earlier. (per MD)     Clinical Impressions Pt agreeable to OT and PT co-evaluation. Pt cognitively impaired today according to pt's daughter who provided previous living history. Pt is mostly independent at baseline with assist for bathing. Today pt required max to total assist for bed mobility and max A for bed to chair transfer with RW. Pt uses quad cane in community at baseline. Pt needs max to total assist for most ADL's at this time. Cognition is also a limiting factor to ADL function. Pt left in the chair with call bell within reach and chair alarm set. Pt will benefit from continued OT in the hospital and recommended venue below to increase strength, balance, and endurance for safe ADL's.        If plan is discharge home, recommend the following:   A lot of help with walking and/or transfers;Two people to help with walking and/or transfers;A lot of help  with bathing/dressing/bathroom;Assistance with cooking/housework;Assistance with feeding;Direct supervision/assist for medications management;Assist for transportation;Help with stairs or ramp for entrance     Functional Status Assessment   Patient has had a recent decline in their functional status and demonstrates the ability to make significant improvements in function in a reasonable and predictable amount of time.     Equipment Recommendations   None recommended by OT             Precautions/Restrictions   Precautions Precautions: Fall Recall of Precautions/Restrictions: Impaired Restrictions Weight Bearing Restrictions Per Provider Order: No     Mobility Bed Mobility Overal bed mobility: Needs Assistance Bed Mobility: Supine to Sit     Supine to sit: Max assist, Total assist     General bed mobility comments: Minimal active assist with the mobility from the pt.    Transfers Overall transfer level: Needs assistance Equipment used: Rolling walker (2 wheels) Transfers: Sit to/from Stand, Bed to chair/wheelchair/BSC Sit to Stand: Max assist     Step pivot transfers: Max assist     General transfer comment: Pt able to bear some weight but assisted to move B LE and shift weight; poor direction following. Much time needed for simple step pivot transfer with RW.      Balance Overall balance assessment: Needs assistance Sitting-balance support: No upper extremity supported, Feet supported Sitting balance-Leahy Scale: Poor Sitting balance - Comments: seated at EOB Postural control: Posterior lean, Right lateral lean Standing balance support: Bilateral upper extremity  supported, During functional activity, Reliant on assistive device for balance Standing balance-Leahy Scale: Poor Standing balance comment: using RW                           ADL either performed or assessed with clinical judgement   ADL Overall ADL's : Needs  assistance/impaired Eating/Feeding: Moderate assistance;Maximal assistance;Sitting   Grooming: Maximal assistance;Sitting   Upper Body Bathing: Maximal assistance;Sitting   Lower Body Bathing: Maximal assistance;Total assistance;Sitting/lateral leans   Upper Body Dressing : Maximal assistance;Sitting   Lower Body Dressing: Maximal assistance;Total assistance;Sitting/lateral leans Lower Body Dressing Details (indicate cue type and reason): Assist to doff and don socks seated at EOB. Toilet Transfer: Maximal assistance;Rolling walker (2 wheels);Stand-pivot Statistician Details (indicate cue type and reason): EOB to chair; much time and labored effort Toileting- Clothing Manipulation and Hygiene: Maximal assistance;Total assistance;Bed level               Vision Baseline Vision/History: 1 Wears glasses (sees double without glasses) Ability to See in Adequate Light: 2 Moderately impaired Patient Visual Report: No change from baseline Vision Assessment?:  (baseline deficits)     Perception Perception: Not tested       Praxis Praxis: Not tested       Pertinent Vitals/Pain Pain Assessment Pain Assessment: Faces Faces Pain Scale: Hurts even more Pain Location: L leg Pain Descriptors / Indicators: Grimacing, Guarding, Moaning Pain Intervention(s): Monitored during session, Limited activity within patient's tolerance, Repositioned     Extremity/Trunk Assessment Upper Extremity Assessment Upper Extremity Assessment: Generalized weakness   Lower Extremity Assessment Lower Extremity Assessment: Defer to PT evaluation   Cervical / Trunk Assessment Cervical / Trunk Assessment: Kyphotic   Communication Communication Communication: No apparent difficulties Factors Affecting Communication: Hearing impaired   Cognition Arousal: Alert Behavior During Therapy: Flat affect Cognition: Cognition impaired   Orientation impairments: Place, Time, Situation       Executive  functioning impairment (select all impairments): Sequencing, Initiation, Problem solving OT - Cognition Comments: Daughter reports pt is usually sharp at baseline.                 Following commands: Impaired Following commands impaired: Follows one step commands inconsistently     Cueing  General Comments   Cueing Techniques: Verbal cues;Gestural cues;Tactile cues  Pt urinated on the floor during initial sit to stand. Floor dried with towel before continuing transfer to chair.              Home Living Family/patient expects to be discharged to:: Private residence Living Arrangements: Alone Available Help at Discharge: Family;Available PRN/intermittently Type of Home: House Home Access: Other (comment) (3 to 4 inch threshold)     Home Layout: Two level;Able to live on main level with bedroom/bathroom     Bathroom Shower/Tub: Tub/shower unit   Bathroom Toilet: Handicapped height (toilet riser) Bathroom Accessibility: No   Home Equipment: Agricultural consultant (2 wheels);Rollator (4 wheels);Cane - quad;Cane - single point;BSC/3in1;Shower seat;Toilet riser;Grab bars - tub/shower;Other (comment) (hurrycane)   Additional Comments: Info provided by pt's daughter.      Prior Functioning/Environment Prior Level of Function : Needs assist;History of Falls (last six months) (2 falls in 6 months)       Physical Assist : ADLs (physical)   ADLs (physical): IADLs;Bathing Mobility Comments: [place] ambulator without AD despite family saying she needs to use AD in the hosue; quad cane used in the community. ADLs Comments: Assist for bathing. Family drives  her to the store but she does the shopping. Independent other ADL's. Able to cook for herself.    OT Problem List: Decreased strength;Decreased range of motion;Decreased activity tolerance;Impaired balance (sitting and/or standing);Decreased cognition;Decreased safety awareness;Decreased knowledge of use of DME or AE;Pain    OT Treatment/Interventions: Self-care/ADL training;Therapeutic exercise;DME and/or AE instruction;Therapeutic activities;Cognitive remediation/compensation;Patient/family education;Balance training      OT Goals(Current goals can be found in the care plan section)   Acute Rehab OT Goals Patient Stated Goal: improve function OT Goal Formulation: With patient/family Time For Goal Achievement: 05/15/24 Potential to Achieve Goals: Good   OT Frequency:  Min 3X/week    Co-evaluation PT/OT/SLP Co-Evaluation/Treatment: Yes Reason for Co-Treatment: To address functional/ADL transfers   OT goals addressed during session: ADL's and self-care      AM-PAC OT 6 Clicks Daily Activity     Outcome Measure Help from another Tyler eating meals?: A Lot Help from another Tyler taking care of personal grooming?: A Lot Help from another Tyler toileting, which includes using toliet, bedpan, or urinal?: A Lot Help from another Tyler bathing (including washing, rinsing, drying)?: A Lot Help from another Tyler to put on and taking off regular upper body clothing?: A Lot Help from another Tyler to put on and taking off regular lower body clothing?: A Lot 6 Click Score: 12   End of Session Equipment Utilized During Treatment: Rolling walker (2 wheels)  Activity Tolerance: Patient tolerated treatment well Patient left: in chair;with call bell/phone within reach;with chair alarm set  OT Visit Diagnosis: Unsteadiness on feet (R26.81);Other abnormalities of gait and mobility (R26.89);Muscle weakness (generalized) (M62.81);History of falling (Z91.81);Other symptoms and signs involving cognitive function                Time: 9054-8983 OT Time Calculation (min): 31 min Charges:  OT General Charges $OT Visit: 1 Visit OT Evaluation $OT Eval Low Complexity: 1 Low  [name] OT, MOT   [name] 05/01/2024, 11:26 AM

## 2024-05-01 NOTE — Plan of Care (Signed)
  Problem: Acute Rehab OT Goals (only OT should resolve) Goal: Pt. Will Perform Eating Flowsheets (Taken 05/01/2024 1129) Pt Will Perform Eating: with modified independence Goal: Pt. Will Perform Grooming Flowsheets (Taken 05/01/2024 1129) Pt Will Perform Grooming:  with contact guard assist  standing Goal: Pt. Will Perform Upper Body Dressing Flowsheets (Taken 05/01/2024 1129) Pt Will Perform Upper Body Dressing:  with contact guard assist  sitting Goal: Pt. Will Perform Lower Body Dressing Flowsheets (Taken 05/01/2024 1129) Pt Will Perform Lower Body Dressing:  with contact guard assist  sitting/lateral leans Goal: Pt. Will Transfer To Toilet Flowsheets (Taken 05/01/2024 1129) Pt Will Transfer to Toilet:  with contact guard assist  stand pivot transfer Goal: Pt. Will Perform Toileting-Clothing Manipulation Flowsheets (Taken 05/01/2024 1129) Pt Will Perform Toileting - Clothing Manipulation and hygiene:  with contact guard assist  sitting/lateral leans Goal: Pt/Caregiver Will Perform Home Exercise Program Flowsheets (Taken 05/01/2024 1129) Pt/caregiver will Perform Home Exercise Program:  Increased strength  Increased ROM  Both right and left upper extremity  Independently  Alaney Witter OT, MOT

## 2024-05-01 NOTE — Care Management Obs Status (Signed)
 MEDICARE OBSERVATION STATUS NOTIFICATION   Patient Details  Name: [name] MRN: [id number] Date of Birth: 03-08-1941   Medicare Observation Status Notification Given:  Yes  Copy Given to Darice at Bedside.  Sharlyne Stabs, RN 05/01/2024, 1:25 PM

## 2024-05-01 NOTE — Progress Notes (Signed)
 Progress Note   Patient: [name] FMW:[id number] DOB: Jan 09, 1941 DOA: 04/30/2024     0 DOS: the patient was seen and examined on 05/01/2024   Brief hospital admission narrative: As per H&P written by Dr. Charlton on 04/30/2024 [name] is a 83 y.o. female with medical history significant for lung cancer status post left upper lobectomy, renal cell carcinoma status post left nephrectomy, remote breast cancer, COPD, hypothyroidism, and hyperlipidemia, now presenting with left hip and left knee pain after fall, generalized weakness, and loss of appetite.   Patient lives alone, is typically able to perform her ADLs independently and ambulate with a walker, but has not been getting out of bed since 04/27/2024.  She seemed to be in her usual state on 04/26/2024 when family saw her, but they found her on the ground the following day.  She was complaining of pain in her left hip and left knee.  She has not been eating or drinking much.  She denies chest pain or abdominal pain, cough or shortness of breath, dysuria or flank pain.  Family at the bedside reports that she did complain of some pain while urinating earlier.   ED Course: Upon arrival to the ED, patient is found to be afebrile and saturating low 90s on room air with mild tachypnea, mild tachycardia, and stable blood pressure.  Labs are most notable for creatinine 1.58, normal WBC, normal CK, and urine nitrites.   Urine was sent for culture and the patient was given 500 mL of LR and 1 g IV Rocephin .  Assessment and plan 1-generalized weakness - Appears to be associated with dehydration, UTI and deconditioning - Continue fluid resuscitation - Continue IV antibiotics and follow culture - Physical therapy/Occupational Therapy has been contacted for assessment and will follow recommendations. - Continue supportive care.  2-hypothyroidism - Continue Synthroid  - TSH within normal limits.  3-acute kidney injury on chronic kidney disease  stage IIIa -In the setting of UTI, prerenal azotemia and dehydration - Continue fluid resuscitation - Continue to follow renal functions are - Minimize nephrotoxic agents - Continue treatment for UTI as mentioned above.  4-COPD - No acute exacerbation currently appreciated - Goal saturation percent on room air - Continue as needed bronchodilator management.  5-hyperlipidemia - Continue Lipitor.  Subjective:  Generally weak and deconditioned; complaining of significant pain with any movement.  No fever, no nausea, no vomiting.  Physical Exam: Vitals:   04/30/24 2013 05/01/24 0056 05/01/24 0342 05/01/24 1327  BP: (!) 109/53 118/66 132/63 99/66  Pulse: 87 86 82 92  Resp: 18 20 20 18   Temp: 98.6 F (37 C) 98.1 F (36.7 C) 98.3 F (36.8 C) 97.8 F (36.6 C)  TempSrc:    Oral  SpO2: 95% 93% 94% 98%  Weight:      Height:       General exam: Calm after receiving analgesics; in no major distress. Respiratory system: Good saturation on room air.  No using accessory muscles. Cardiovascular system:RRR.  No rubs or gallops; no JVD. Gastrointestinal system: Abdomen is nondistended, soft and nontender. No organomegaly or masses felt. Normal bowel sounds heard. Central nervous system: Generally weak.  No focal neurological deficits. Extremities: No cyanosis or clubbing. Skin: No petechiae. Psychiatry: Judgement and insight appear normal. Mood & affect appropriate.    Data Reviewed: Basic metabolic panel: Sodium 142, potassium 3.7, chloride 105, bicarb 23, BUN 40, creatinine 1.48 and GFR 35 CBC: WBCs 6.1, hemoglobin 12.9 and platelet count 212K B12: 681 RPR:  Nonreactive Ammonia:19  Family Communication: No family at bedside.  Disposition: Status is: Observation The patient will require care spanning > 2 midnights and should be moved to inpatient because: Continue IV therapy.  Physical therapy evaluation has been requested and will follow recommendations for safest discharge  plan.  Time spent: 50 minutes  Author: Eric Nunnery, MD 05/01/2024 6:20 PM  For on call review [email or web address].

## 2024-05-02 DIAGNOSIS — E860 Dehydration: Secondary | ICD-10-CM | POA: Diagnosis not present

## 2024-05-02 DIAGNOSIS — N1831 Chronic kidney disease, stage 3a: Secondary | ICD-10-CM | POA: Diagnosis not present

## 2024-05-02 DIAGNOSIS — N179 Acute kidney failure, unspecified: Secondary | ICD-10-CM | POA: Diagnosis not present

## 2024-05-02 DIAGNOSIS — R5381 Other malaise: Secondary | ICD-10-CM | POA: Diagnosis not present

## 2024-05-02 DIAGNOSIS — J449 Chronic obstructive pulmonary disease, unspecified: Secondary | ICD-10-CM | POA: Diagnosis not present

## 2024-05-02 DIAGNOSIS — N390 Urinary tract infection, site not specified: Secondary | ICD-10-CM | POA: Diagnosis not present

## 2024-05-02 DIAGNOSIS — R319 Hematuria, unspecified: Secondary | ICD-10-CM | POA: Diagnosis not present

## 2024-05-02 DIAGNOSIS — E039 Hypothyroidism, unspecified: Secondary | ICD-10-CM | POA: Diagnosis not present

## 2024-05-02 LAB — BASIC METABOLIC PANEL WITH GFR
Anion gap: 10 (ref 5–15)
BUN: 32 mg/dL — ABNORMAL HIGH (ref 8–23)
CO2: 26 mmol/L (ref 22–32)
Calcium: 8.8 mg/dL — ABNORMAL LOW (ref 8.9–10.3)
Chloride: 108 mmol/L (ref 98–111)
Creatinine, Ser: 1.22 mg/dL — ABNORMAL HIGH (ref 0.44–1.00)
GFR, Estimated: 45 mL/min — ABNORMAL LOW (ref >60)
Glucose, Bld: 112 mg/dL — ABNORMAL HIGH (ref 70–99)
Potassium: 4.5 mmol/L (ref 3.5–5.1)
Sodium: 144 mmol/L (ref 135–145)

## 2024-05-02 LAB — CBC
HCT: 43.6 % (ref 36.0–46.0)
Hemoglobin: 13.7 g/dL (ref 12.0–15.0)
MCH: 29.6 pg (ref 26.0–34.0)
MCHC: 31.4 g/dL (ref 30.0–36.0)
MCV: 94.2 fL (ref 80.0–100.0)
Platelets: 214 K/uL (ref 150–400)
RBC: 4.63 MIL/uL (ref 3.87–5.11)
RDW: 13.6 % (ref 11.5–15.5)
WBC: 7 K/uL (ref 4.0–10.5)
nRBC: 0 % (ref 0.0–0.2)

## 2024-05-02 LAB — URINE CULTURE: Culture: >=100000 — AB

## 2024-05-02 MED ORDER — CIPROFLOXACIN HCL 250 MG PO TABS
500.0000 mg | ORAL_TABLET | Freq: Two times a day (BID) | ORAL | Status: DC
  Administered 2024-05-02 – 2024-05-04 (×4): 500 mg via ORAL
  Filled 2024-05-02 (×4): qty 2

## 2024-05-02 NOTE — Progress Notes (Signed)
 Tick removed by staff members from the right upper, lateral breast. Patient tolerated well. Tick placed in specimen cup. Provider to be informed.

## 2024-05-02 NOTE — Progress Notes (Signed)
 Progress Note   Patient: [name]:[id number] DOB: 07-Apr-1941 DOA: 04/30/2024     0 DOS: the patient was seen and examined on 05/02/2024   Brief hospital admission narrative: As per H&P written by Dr. Charlton on 04/30/2024 [name] is a 83 y.o. female with medical history significant for lung cancer status post left upper lobectomy, renal cell carcinoma status post left nephrectomy, remote breast cancer, COPD, hypothyroidism, and hyperlipidemia, now presenting with left hip and left knee pain after fall, generalized weakness, and loss of appetite.   Patient lives alone, is typically able to perform her ADLs independently and ambulate with a walker, but has not been getting out of bed since 04/27/2024.  She seemed to be in her usual state on 04/26/2024 when family saw her, but they found her on the ground the following day.  She was complaining of pain in her left hip and left knee.  She has not been eating or drinking much.  She denies chest pain or abdominal pain, cough or shortness of breath, dysuria or flank pain.  Family at the bedside reports that she did complain of some pain while urinating earlier.   ED Course: Upon arrival to the ED, patient is found to be afebrile and saturating low 90s on room air with mild tachypnea, mild tachycardia, and stable blood pressure.  Labs are most notable for creatinine 1.58, normal WBC, normal CK, and urine nitrites.   Urine was sent for culture and the patient was given 500 mL of LR and 1 g IV Rocephin .  Assessment and plan 1-generalized weakness - Appears to be associated with dehydration, UTI and deconditioning - Continue fluid resuscitation - Continue IV antibiotics and follow culture results/sensitivity - Physical therapy/Occupational Therapy has been contacted for assessment and will follow recommendations. - Continue supportive care.  2-hypothyroidism - Continue Synthroid  - TSH within normal limits.  3-acute kidney injury on chronic  kidney disease stage IIIa -In the setting of UTI, prerenal azotemia and dehydration - Continue to maintain adequate hydration - Continue to follow renal functions are - Continue to minimize nephrotoxic agents - Continue treatment for UTI as mentioned above.  4-COPD - No acute exacerbation currently appreciated - Goal saturation percent on room air - Continue as needed bronchodilator management.  5-hyperlipidemia - Continue Lipitor. - Heart healthy diet discussed with patient.  Subjective:  Generally weak and deconditioned; overall feeling better today.  No nausea, no vomiting, and afebrile.  Physical Exam: Vitals:   05/01/24 1327 05/01/24 1954 05/02/24 0531 05/02/24 1310  BP: 99/66 (!) 145/72 (!) 154/70 131/72  Pulse: 92 79 76 92  Resp: 18 17 18 16   Temp: 97.8 F (36.6 C) 97.8 F (36.6 C) 98.3 F (36.8 C) 97.7 F (36.5 C)  TempSrc: Oral Oral Oral Oral  SpO2: 98% 97% 97% 95%  Weight:      Height:       General exam: Alert, awake, oriented x 3; reporting generalized weakness and overall muscle aches.  No chest pain, no nausea, no vomiting. Respiratory system: Saturation on room air. Cardiovascular system:RRR. No rubs or gallops; no JVD. Gastrointestinal system: Abdomen is nondistended, soft and nontender.  Positive bowel sounds. Central nervous system: No focal neurological deficits. Extremities: No cyanosis or clubbing. Skin: No petechiae. Psychiatry: Judgement and insight appear normal. Mood & affect appropriate.   Latest data Reviewed: B12: 681 RPR: Nonreactive Ammonia:19 CBC: WBC 7.0, hemoglobin 13.7 platelet count 214K Basic metabolic panel: Sodium 144, potassium 4.5, chloride 108, bicarb  26, BUN 32, creatinine 1.22 and GFR 45.  Family Communication: No family at bedside.  Disposition: Status is: Observation The patient will require care spanning > 2 midnights and should be moved to inpatient because: Continue IV therapy.  Physical therapy evaluation has  been requested and will follow recommendations for safest discharge plan.  Time spent: 50 minutes  Author: Eric Nunnery, MD 05/02/2024 3:56 PM  For on call review [email or web address].

## 2024-05-02 NOTE — Plan of Care (Signed)

## 2024-05-02 NOTE — Plan of Care (Signed)

## 2024-05-03 DIAGNOSIS — E039 Hypothyroidism, unspecified: Secondary | ICD-10-CM | POA: Diagnosis not present

## 2024-05-03 DIAGNOSIS — R531 Weakness: Secondary | ICD-10-CM

## 2024-05-03 DIAGNOSIS — N390 Urinary tract infection, site not specified: Secondary | ICD-10-CM | POA: Diagnosis not present

## 2024-05-03 DIAGNOSIS — N1831 Chronic kidney disease, stage 3a: Secondary | ICD-10-CM | POA: Diagnosis not present

## 2024-05-03 DIAGNOSIS — R319 Hematuria, unspecified: Secondary | ICD-10-CM | POA: Diagnosis not present

## 2024-05-03 DIAGNOSIS — E860 Dehydration: Secondary | ICD-10-CM | POA: Diagnosis not present

## 2024-05-03 DIAGNOSIS — J449 Chronic obstructive pulmonary disease, unspecified: Secondary | ICD-10-CM | POA: Diagnosis not present

## 2024-05-03 DIAGNOSIS — R5381 Other malaise: Secondary | ICD-10-CM | POA: Diagnosis not present

## 2024-05-03 LAB — CBC
HCT: 42.3 % (ref 36.0–46.0)
Hemoglobin: 13.7 g/dL (ref 12.0–15.0)
MCH: 30.3 pg (ref 26.0–34.0)
MCHC: 32.4 g/dL (ref 30.0–36.0)
MCV: 93.6 fL (ref 80.0–100.0)
Platelets: 241 K/uL (ref 150–400)
RBC: 4.52 MIL/uL (ref 3.87–5.11)
RDW: 13.3 % (ref 11.5–15.5)
WBC: 6.9 K/uL (ref 4.0–10.5)
nRBC: 0 % (ref 0.0–0.2)

## 2024-05-03 LAB — BASIC METABOLIC PANEL WITH GFR
Anion gap: 12 (ref 5–15)
BUN: 25 mg/dL — ABNORMAL HIGH (ref 8–23)
CO2: 24 mmol/L (ref 22–32)
Calcium: 8.6 mg/dL — ABNORMAL LOW (ref 8.9–10.3)
Chloride: 105 mmol/L (ref 98–111)
Creatinine, Ser: 1.12 mg/dL — ABNORMAL HIGH (ref 0.44–1.00)
GFR, Estimated: 49 mL/min — ABNORMAL LOW (ref >60)
Glucose, Bld: 113 mg/dL — ABNORMAL HIGH (ref 70–99)
Potassium: 3.6 mmol/L (ref 3.5–5.1)
Sodium: 141 mmol/L (ref 135–145)

## 2024-05-03 NOTE — TOC CM/SW Note (Signed)
 Auth approved. Darice Redo, daughter and POA, accepted bed offer at [facility] Isaiah updated and can admit tomorrow.

## 2024-05-03 NOTE — Plan of Care (Signed)

## 2024-05-03 NOTE — Progress Notes (Signed)
 Progress Note   Patient: [name] DOB: March 12, 1941 DOA: 04/30/2024     0 DOS: the patient was seen and examined on 05/03/2024   Brief hospital admission narrative: As per H&P written by Dr. Charlton on 04/30/2024 [name] is a 83 y.o. female with medical history significant for lung cancer status post left upper lobectomy, renal cell carcinoma status post left nephrectomy, remote breast cancer, COPD, hypothyroidism, and hyperlipidemia, now presenting with left hip and left knee pain after fall, generalized weakness, and loss of appetite.   Patient lives alone, is typically able to perform her ADLs independently and ambulate with a walker, but has not been getting out of bed since 04/27/2024.  She seemed to be in her usual state on 04/26/2024 when family saw her, but they found her on the ground the following day.  She was complaining of pain in her left hip and left knee.  She has not been eating or drinking much.  She denies chest pain or abdominal pain, cough or shortness of breath, dysuria or flank pain.  Family at the bedside reports that she did complain of some pain while urinating earlier.   ED Course: Upon arrival to the ED, patient is found to be afebrile and saturating low 90s on room air with mild tachypnea, mild tachycardia, and stable blood pressure.  Labs are most notable for creatinine 1.58, normal WBC, normal CK, and urine nitrites.   Urine was sent for culture and the patient was given 500 mL of LR and 1 g IV Rocephin .  Assessment and plan 1-generalized weakness/Morganella UTI - Appears to be associated with dehydration, UTI and deconditioning - Antibiotics transition to oral ciprofloxacin  based on culture results and sensitivity. - Physical therapy/Occupational Therapy has been contacted for assessment and will follow recommendations. - Continue supportive care and maintain adequate hydration..  2-hypothyroidism - Continue Synthroid  - TSH within normal  limits.  3-acute kidney injury on chronic kidney disease stage IIIa -In the setting of UTI, prerenal azotemia and dehydration - Continue to maintain adequate hydration - Continue to follow renal functions are - Continue to minimize nephrotoxic agents - Continue treatment for UTI as mentioned above.  4-COPD - No acute exacerbation currently appreciated - Goal saturation percent on room air - Continue as needed bronchodilator management.  5-hyperlipidemia - Continue Lipitor. - Heart healthy diet discussed with patient.  Subjective:  Reports overall feeling better; no nausea, no vomiting, no chest pain.  No dysuria.  Generally weak and deconditioned but is present less generalized pain.  Physical Exam: Vitals:   05/02/24 1310 05/02/24 1951 05/03/24 0427 05/03/24 1327  BP: 131/72 137/72 139/72 130/71  Pulse: 92 79 78 87  Resp: 16 20 20 18   Temp: 97.7 F (36.5 C) 98 F (36.7 C) 98.6 F (37 C) 98 F (36.7 C)  TempSrc: Oral Oral Oral Oral  SpO2: 95% 95% 94% 98%  Weight:      Height:       General exam: Alert, awake, oriented x 3; in no acute distress.  No overnight events.  Overall feeling better. Respiratory system: Clear to auscultation. Respiratory effort normal.  Good saturation on room air. Cardiovascular system:RRR. No murmurs, rubs, gallops. Gastrointestinal system: Abdomen is nondistended, soft and nontender.  Positive bowel sounds. Central nervous system: No focal neurological deficits. Extremities: No C/C/E, +pedal pulses Skin: No petechiae. Psychiatry: Judgement and insight appear normal. Mood & affect appropriate.   Latest data Reviewed: B12: 681 RPR: Nonreactive Ammonia:19 CBC: WBC  6.9, hemoglobin 13.7 and platelet count 241K Basic metabolic panel: Sodium 141, potassium 3.6, chloride 105, bicarb 24, BUN 25, creatinine 1.1 and GFR 49.  Family Communication: No family at bedside.  Disposition: Status is: Observation The patient will require care spanning >  2 midnights and should be moved to inpatient because: Continue IV therapy.  Physical therapy evaluation has been requested and will follow recommendations for safest discharge plan.  Time spent: 50 minutes  Author: Eric Nunnery, MD 05/03/2024 6:11 PM  For on call review [email or web address].

## 2024-05-04 DIAGNOSIS — J439 Emphysema, unspecified: Secondary | ICD-10-CM | POA: Diagnosis not present

## 2024-05-04 DIAGNOSIS — R1311 Dysphagia, oral phase: Secondary | ICD-10-CM | POA: Diagnosis not present

## 2024-05-04 DIAGNOSIS — Z87891 Personal history of nicotine dependence: Secondary | ICD-10-CM | POA: Diagnosis not present

## 2024-05-04 DIAGNOSIS — R319 Hematuria, unspecified: Secondary | ICD-10-CM | POA: Diagnosis not present

## 2024-05-04 DIAGNOSIS — N3011 Interstitial cystitis (chronic) with hematuria: Secondary | ICD-10-CM | POA: Diagnosis not present

## 2024-05-04 DIAGNOSIS — Z853 Personal history of malignant neoplasm of breast: Secondary | ICD-10-CM | POA: Diagnosis not present

## 2024-05-04 DIAGNOSIS — C3492 Malignant neoplasm of unspecified part of left bronchus or lung: Secondary | ICD-10-CM | POA: Diagnosis not present

## 2024-05-04 DIAGNOSIS — Z85528 Personal history of other malignant neoplasm of kidney: Secondary | ICD-10-CM | POA: Diagnosis not present

## 2024-05-04 DIAGNOSIS — E039 Hypothyroidism, unspecified: Secondary | ICD-10-CM | POA: Diagnosis not present

## 2024-05-04 DIAGNOSIS — R531 Weakness: Secondary | ICD-10-CM | POA: Diagnosis not present

## 2024-05-04 DIAGNOSIS — E785 Hyperlipidemia, unspecified: Secondary | ICD-10-CM | POA: Diagnosis not present

## 2024-05-04 DIAGNOSIS — J449 Chronic obstructive pulmonary disease, unspecified: Secondary | ICD-10-CM | POA: Diagnosis not present

## 2024-05-04 DIAGNOSIS — M6289 Other specified disorders of muscle: Secondary | ICD-10-CM | POA: Diagnosis not present

## 2024-05-04 DIAGNOSIS — N1831 Chronic kidney disease, stage 3a: Secondary | ICD-10-CM | POA: Diagnosis not present

## 2024-05-04 DIAGNOSIS — N183 Chronic kidney disease, stage 3 unspecified: Secondary | ICD-10-CM | POA: Diagnosis not present

## 2024-05-04 DIAGNOSIS — N390 Urinary tract infection, site not specified: Secondary | ICD-10-CM | POA: Diagnosis not present

## 2024-05-04 DIAGNOSIS — Z905 Acquired absence of kidney: Secondary | ICD-10-CM | POA: Diagnosis not present

## 2024-05-04 DIAGNOSIS — R5381 Other malaise: Secondary | ICD-10-CM | POA: Diagnosis not present

## 2024-05-04 DIAGNOSIS — Z85118 Personal history of other malignant neoplasm of bronchus and lung: Secondary | ICD-10-CM | POA: Diagnosis not present

## 2024-05-04 DIAGNOSIS — R2689 Other abnormalities of gait and mobility: Secondary | ICD-10-CM | POA: Diagnosis not present

## 2024-05-04 DIAGNOSIS — C642 Malignant neoplasm of left kidney, except renal pelvis: Secondary | ICD-10-CM | POA: Diagnosis not present

## 2024-05-04 DIAGNOSIS — R413 Other amnesia: Secondary | ICD-10-CM | POA: Diagnosis not present

## 2024-05-04 DIAGNOSIS — D649 Anemia, unspecified: Secondary | ICD-10-CM | POA: Diagnosis not present

## 2024-05-04 DIAGNOSIS — M11262 Other chondrocalcinosis, left knee: Secondary | ICD-10-CM | POA: Diagnosis not present

## 2024-05-04 DIAGNOSIS — C3490 Malignant neoplasm of unspecified part of unspecified bronchus or lung: Secondary | ICD-10-CM | POA: Diagnosis not present

## 2024-05-04 DIAGNOSIS — E860 Dehydration: Secondary | ICD-10-CM | POA: Diagnosis not present

## 2024-05-04 DIAGNOSIS — M6281 Muscle weakness (generalized): Secondary | ICD-10-CM | POA: Diagnosis not present

## 2024-05-04 DIAGNOSIS — R42 Dizziness and giddiness: Secondary | ICD-10-CM | POA: Diagnosis not present

## 2024-05-04 DIAGNOSIS — Z800 Family history of malignant neoplasm of digestive organs: Secondary | ICD-10-CM | POA: Diagnosis not present

## 2024-05-04 DIAGNOSIS — R5383 Other fatigue: Secondary | ICD-10-CM | POA: Diagnosis not present

## 2024-05-04 DIAGNOSIS — N3000 Acute cystitis without hematuria: Secondary | ICD-10-CM | POA: Diagnosis not present

## 2024-05-04 DIAGNOSIS — I700 Atherosclerosis of aorta: Secondary | ICD-10-CM | POA: Diagnosis not present

## 2024-05-04 DIAGNOSIS — J189 Pneumonia, unspecified organism: Secondary | ICD-10-CM | POA: Diagnosis not present

## 2024-05-04 DIAGNOSIS — C50919 Malignant neoplasm of unspecified site of unspecified female breast: Secondary | ICD-10-CM | POA: Diagnosis not present

## 2024-05-04 DIAGNOSIS — Z23 Encounter for immunization: Secondary | ICD-10-CM | POA: Diagnosis not present

## 2024-05-04 DIAGNOSIS — Z9012 Acquired absence of left breast and nipple: Secondary | ICD-10-CM | POA: Diagnosis not present

## 2024-05-04 DIAGNOSIS — Z801 Family history of malignant neoplasm of trachea, bronchus and lung: Secondary | ICD-10-CM | POA: Diagnosis not present

## 2024-05-04 MED ORDER — CIPROFLOXACIN HCL 500 MG PO TABS: 500.0000 mg | ORAL_TABLET | Freq: Two times a day (BID) | ORAL | 0 refills | Status: AC

## 2024-05-04 MED ORDER — TRAMADOL HCL 50 MG PO TABS: 50.0000 mg | ORAL_TABLET | Freq: Three times a day (TID) | ORAL | 0 refills | Status: AC | PRN

## 2024-05-04 MED ORDER — ACETAMINOPHEN 500 MG PO TABS: 1000.0000 mg | ORAL_TABLET | Freq: Three times a day (TID) | ORAL | Status: AC | PRN

## 2024-05-04 MED ORDER — POLYETHYLENE GLYCOL 3350 17 G PO PACK: 17.0000 g | PACK | Freq: Every day | ORAL | Status: AC | PRN

## 2024-05-04 MED ORDER — OXYCODONE HCL 5 MG PO TABS: 2.5000 mg | ORAL_TABLET | Freq: Four times a day (QID) | ORAL | 0 refills | Status: AC | PRN

## 2024-05-04 MED ORDER — DOCUSATE SODIUM 100 MG PO CAPS: 100.0000 mg | ORAL_CAPSULE | Freq: Two times a day (BID) | ORAL | 2 refills | Status: AC

## 2024-05-04 NOTE — Progress Notes (Signed)
 Physical Therapy Treatment Patient Details Name: [name] MRN: [id number] DOB: 1940/11/30 Today's Date: 05/04/2024   History of Present Illness YANCY Tyler is a 83 y.o. female with medical history significant for lung cancer status post left upper lobectomy, renal cell carcinoma status post left nephrectomy, remote breast cancer, COPD, hypothyroidism, and hyperlipidemia, now presenting with left hip and left knee pain after fall, generalized weakness, and loss of appetite.     Patient lives alone, is typically able to perform her ADLs independently and ambulate with a walker, but has not been getting out of bed since 04/27/2024.  She seemed to be in her usual state on 04/26/2024 when family saw her, but they found her on the ground the following day.  She was complaining of pain in her left hip and left knee.  She has not been eating or drinking much.  She denies chest pain or abdominal pain, cough or shortness of breath, dysuria or flank pain.  Family at the bedside reports that she did complain of some pain while urinating earlier.    PT Comments  Pt agreeable to participation, however very little effort given and mostly max to total assist from therapist with bed mobility.  Pt at EOB with total assist to get feet on floor.  No effort to attempt to stand and then c/o dizziness and wished to lay back down.  No pain, shortness of breath or issues noted.  Total assist to transfer back to supine from seated.      Mobility  Bed Mobility Overal bed mobility: Needs Assistance Bed Mobility: Supine to Sit     Supine to sit: Max assist, Total assist     General bed mobility comments: pt demo little effort throughout all bed mobility, unsure if due to inc pain or dec processing cognitively, max/total with HOB flat    Transfers Overall transfer level: Needs assistance Equipment used: Rolling walker (2 wheels) Transfers: Sit to/from Stand, Bed to chair/wheelchair/BSC Sit to Stand: Total  assist           General transfer comment: therapist attempted, no effort from patient then c/o dizziness and wished to lay back down    Ambulation/Gait  unable today                         Communication Communication Communication: No apparent difficulties Factors Affecting Communication: Hearing impaired;Difficulty expressing self;Other (comment) (Pt daughter reports pt has hearing aids but refuses to wear them)  Cognition Arousal: Alert Behavior During Therapy: Flat affect                           PT - Cognition Comments: Alert to self, difficulty with expressing herself during some questions. Pt daughter reports this is not her baseline, reports she sometimes has issue with word finding at baseline Following commands: Impaired Following commands impaired: Follows one step commands inconsistently    Cueing Cueing Techniques: Verbal cues, Gestural cues, Tactile cues    PT Goals (current goals can now be found in the care plan section)      Frequency    Min 3X/week      PT Plan  Continue to progress towards goals    Co-evaluation PT/OT/SLP Co-Evaluation/Treatment: Yes Reason for Co-Treatment: To address functional/ADL transfers PT goals addressed during session: Mobility/safety with mobility        AM-PAC PT 6 Clicks Mobility   Outcome Measure  Help  needed turning from your back to your side while in a flat bed without using bedrails?: A Lot Help needed moving from lying on your back to sitting on the side of a flat bed without using bedrails?: A Lot Help needed moving to and from a bed to a chair (including a wheelchair)?: A Lot Help needed standing up from a chair using your arms (e.g., wheelchair or bedside chair)?: A Lot Help needed to walk in hospital room?: A Lot Help needed climbing 3-5 steps with a railing? : A Lot 6 Click Score: 12    End of Session Equipment Utilized During Treatment: Gait belt Activity Tolerance:  Patient limited by fatigue;Patient limited by lethargy Patient left: with call bell/phone within reach;in bed   PT Visit Diagnosis: Unsteadiness on feet (R26.81);History of falling (Z91.81);Muscle weakness (generalized) (M62.81);Pain;Other abnormalities of gait and mobility (R26.89);Difficulty in walking, not elsewhere classified (R26.2) Pain - Right/Left: Left Pain - part of body: Leg;Knee;Ankle and joints of foot     Time: 0922-0935 PT Time Calculation (min) (ACUTE ONLY): 13 min  Charges:    $Therapeutic Activity: 8-22 mins PT General Charges $$ ACUTE PT VISIT: 1 Visit                     Greig KATHEE Fuse, PTA/CLT [facility] Ph: [phone]    Fuse Greig KATHEE 05/04/2024, 10:47 AM

## 2024-05-04 NOTE — Plan of Care (Signed)

## 2024-05-04 NOTE — Progress Notes (Signed)
 EMS has arrived to transport patient to [facility]

## 2024-05-04 NOTE — Discharge Summary (Signed)
 Physician Discharge Summary   Patient: [name] MRN: [id number] DOB: 1941/05/16  Admit date:     04/30/2024  Discharge date: 05/04/24  Discharge Physician: Eric Nunnery   PCP: Zollie Lowers, MD   Recommendations at discharge:  Repeat basic metabolic panel to follow electrolytes and renal function Repeat LFTs and lipid panel in 8 to 12 weeks and adjust statin dosage as required.  Discharge Diagnoses: Principal Problem:   General weakness Active Problems:   Acute dehydration   Hypothyroidism   Urinary tract infection with hematuria   COPD (chronic obstructive pulmonary disease) (HCC)   CKD stage 3a, GFR 45-59 ml/min [facility])  Brief hospital admission narrative: As per H&P written by Dr. Charlton on 04/30/2024 [name] is a 83 y.o. female with medical history significant for lung cancer status post left upper lobectomy, renal cell carcinoma status post left nephrectomy, remote breast cancer, COPD, hypothyroidism, and hyperlipidemia, now presenting with left hip and left knee pain after fall, generalized weakness, and loss of appetite.   Patient lives alone, is typically able to perform her ADLs independently and ambulate with a walker, but has not been getting out of bed since 04/27/2024.  She seemed to be in her usual state on 04/26/2024 when family saw her, but they found her on the ground the following day.  She was complaining of pain in her left hip and left knee.  She has not been eating or drinking much.  She denies chest pain or abdominal pain, cough or shortness of breath, dysuria or flank pain.  Family at the bedside reports that she did complain of some pain while urinating earlier.   ED Course: Upon arrival to the ED, patient is found to be afebrile and saturating low 90s on room air with mild tachypnea, mild tachycardia, and stable blood pressure.  Labs are most notable for creatinine 1.58, normal WBC, normal CK, and urine nitrites.   Urine was sent for culture and the  patient was given 500 mL of LR and 1 g IV Rocephin .  Assessment and Plan: 1-generalized weakness/Morganella UTI - Appears to be associated with dehydration, UTI and deconditioning -Microorganism resistant to ampicillin and nitrofurantoin. - Antibiotics transition to oral ciprofloxacin  based on culture results sensitivity. -At time of discharge for 4 more days of antibiotic therapy pending. - Appreciate assistance and recommendation by PT/OT services; patient will be discharged to skilled nursing facility for further care and rehabilitation. - Continue supportive care and maintain adequate hydration..   2-hypothyroidism - Continue Synthroid  - TSH within normal limits.   3-acute kidney injury on chronic kidney disease stage IIIa -In the setting of UTI, prerenal azotemia and dehydration - Continue to maintain adequate hydration - Continue to minimize nephrotoxic agents and follow renal function trend. - Continue treatment for UTI as mentioned above.   4-COPD - No acute exacerbation currently appreciated - Goal saturation percent on room air - Continue as needed bronchodilator management.   5-hyperlipidemia - Continue Lipitor. - Heart healthy diet discussed with patient.  Consultants: None Procedures performed: See below for x-ray reports. Disposition: Skilled nursing facility Diet recommendation: Heart healthy diet.  DISCHARGE MEDICATION: Allergies as of 05/04/2024       Reactions   Chantix [varenicline] Other (See Comments)   Depression    Dicyclomine  Hcl Itching        Medication List     TAKE these medications    acetaminophen  500 MG tablet Commonly known as: TYLENOL  Take 2 tablets (1,000 mg total) by  mouth every 8 (eight) hours as needed for moderate pain (pain score 4-6). What changed: how much to take   ascorbic acid 500 MG tablet Commonly known as: VITAMIN C Take 500 mg by mouth daily.   atorvastatin  20 MG tablet Commonly known as: LIPITOR TAKE 1 TABLET  EVERY DAY FOR CHOLESTEROL   Centrum Silver Ultra Womens Tabs Take 1 tablet by mouth daily.   ciprofloxacin  500 MG tablet Commonly known as: CIPRO  Take 1 tablet (500 mg total) by mouth 2 (two) times daily for 4 days.   docusate sodium  100 MG capsule Commonly known as: Colace Take 1 capsule (100 mg total) by mouth 2 (two) times daily.   ferrous sulfate  325 (65 FE) MG EC tablet Take 325 mg by mouth daily.   hydroxypropyl methylcellulose / hypromellose 2.5 % ophthalmic solution Commonly known as: ISOPTO TEARS / GONIOVISC Place 1 drop into both eyes 3 (three) times daily as needed for dry eyes.   levothyroxine  88 MCG tablet Commonly known as: SYNTHROID  TAKE 1 TABLET EVERY DAY BEFORE BREAKFAST What changed: See the new instructions.   meclizine  25 MG tablet Commonly known as: ANTIVERT  TAKE ONE TABLET BY MOUTH DAILY AS NEEDED FOR DIZZINESS FOR NAUSEA What changed: See the new instructions.   oxyCODONE  5 MG immediate release tablet Commonly known as: Oxy IR/ROXICODONE  Take 0.5-1 tablets (2.5-5 mg total) by mouth every 6 (six) hours as needed for severe pain (pain score 7-10).   polyethylene glycol 17 g packet Commonly known as: MiraLax  Take 17 g by mouth daily as needed.   traMADol  50 MG tablet Commonly known as: ULTRAM  Take 1 tablet (50 mg total) by mouth every 8 (eight) hours as needed for moderate pain (pain score 4-6). What changed: when to take this        Contact information for follow-up providers     Zollie Lowers, MD. Schedule an appointment as soon as possible for a visit in 10 day(s).   Specialty: Family Medicine Why: After discharge from SNF. Contact information: [address] [place] [address] [phone]              Contact information for after-discharge care     Destination     [facility] .   Service: Skilled Nursing Contact information: [address] [facility] [place]  [address] [phone]                     Discharge Exam: [name]   04/30/24 1451 04/30/24 2012  Weight: 62.8 kg 60.7 kg   General exam: Alert, awake, oriented x 3; in no acute distress.  No overnight events.  Overall feeling better. Respiratory system: Clear to auscultation. Respiratory effort normal.  Good saturation on room air. Cardiovascular system:RRR. No murmurs, rubs, gallops. Gastrointestinal system: Abdomen is nondistended, soft and nontender.  Positive bowel sounds. Central nervous system: No focal neurological deficits. Extremities: No C/C/E, +pedal pulses Skin: No petechiae. Psychiatry: Judgement and insight appear normal. Mood & affect appropriate.   Condition at discharge: Stable and improved.  The results of significant diagnostics from this hospitalization (including imaging, microbiology, ancillary and laboratory) are listed below for reference.   Imaging Studies: DG Knee Left Port Result Date: 04/30/2024 CLINICAL DATA:  Left knee pain after a fall. Fell on Sunday. Unable to move the leg. EXAM: PORTABLE LEFT KNEE - 1-2 VIEW COMPARISON:  10/04/2022 FINDINGS: Degenerative changes in the left knee with medial greater than lateral compartment narrowing. Cartilaginous calcification in the medial,  lateral, and patellofemoral compartments. No significant progression since previous study. No evidence of acute fracture or dislocation. No focal bone lesion or bone destruction. No significant effusions. Mild vascular calcifications. IMPRESSION: Degenerative changes in the left knee with chondrocalcinosis. No acute bony abnormalities. Electronically Signed   By: Elsie Gravely M.D.   On: 04/30/2024 21:07   CT CHEST ABDOMEN PELVIS WO CONTRAST Result Date: 04/30/2024 CLINICAL DATA:  Unwitnessed fall several days ago. EXAM: CT CHEST, ABDOMEN AND PELVIS WITHOUT CONTRAST TECHNIQUE: Multidetector CT imaging of the chest, abdomen and pelvis was performed following the standard protocol without IV contrast. RADIATION  DOSE REDUCTION: This exam was performed according to the departmental dose-optimization program which includes automated exposure control, adjustment of the mA and/or kV according to patient size and/or use of iterative reconstruction technique. COMPARISON:  November 15, 2023.  April 26, 2022. FINDINGS: CT CHEST FINDINGS Cardiovascular: Atherosclerosis of thoracic aorta without aneurysm formation. Normal cardiac size. No pericardial effusion. Coronary artery calcifications are noted. Mediastinum/Nodes: No enlarged mediastinal, hilar, or axillary lymph nodes. Thyroid  gland, trachea, and esophagus demonstrate no significant findings. Lungs/Pleura: No pneumothorax or pleural effusion is noted. Minimal right posterior basilar subsegmental atelectasis. Left lingular scarring or subsegmental atelectasis is noted. Musculoskeletal: No chest wall mass or suspicious bone lesions identified. CT ABDOMEN PELVIS FINDINGS Hepatobiliary: No focal liver abnormality is seen. Status post cholecystectomy. No biliary dilatation. Pancreas: Unremarkable. No pancreatic ductal dilatation or surrounding inflammatory changes. Spleen: Normal in size without focal abnormality. Adrenals/Urinary Tract: Adrenal glands appear normal. Status post left nephrectomy. Nonobstructive right nephrolithiasis is noted. Mild right hydroureteronephrosis is noted without obstructing calculus. Urinary bladder is unremarkable. Stomach/Bowel: Stomach is within normal limits. Appendix appears normal. No evidence of bowel wall thickening, distention, or inflammatory changes. Sigmoid diverticulosis without inflammation. Vascular/Lymphatic: Aortic atherosclerosis. No enlarged abdominal or pelvic lymph nodes. Reproductive: Uterus and bilateral adnexa are unremarkable. Other: No ascites or hernia is noted. Musculoskeletal: No acute or significant osseous findings. IMPRESSION: 1. No definite traumatic injury seen in the chest, abdomen or pelvis. 2. Coronary artery  calcifications are noted suggesting coronary artery disease. 3. Status post left nephrectomy. 4. Nonobstructive right nephrolithiasis. Mild right hydroureteronephrosis is noted without obstructing calculus. 5. Sigmoid diverticulosis without inflammation. 6. Aortic atherosclerosis. Aortic Atherosclerosis (ICD10-I70.0). Electronically Signed   By: Lynwood Landy Raddle M.D.   On: 04/30/2024 16:52   CT Head Wo Contrast Result Date: 04/30/2024 CLINICAL DATA:  Fall EXAM: CT HEAD WITHOUT CONTRAST CT CERVICAL SPINE WITHOUT CONTRAST TECHNIQUE: Multidetector CT imaging of the head and cervical spine was performed following the standard protocol without intravenous contrast. Multiplanar CT image reconstructions of the cervical spine were also generated. RADIATION DOSE REDUCTION: This exam was performed according to the departmental dose-optimization program which includes automated exposure control, adjustment of the mA and/or kV according to patient size and/or use of iterative reconstruction technique. COMPARISON:  Chest CT 11/15/2023 FINDINGS: CT HEAD FINDINGS Brain: No acute territorial infarction, hemorrhage or intracranial mass. Atrophy and moderate chronic small vessel ischemic changes of the white matter. Nonenlarged ventricles. Probable chronic lacunar infarcts in the right thalamus and basal ganglia. Vascular: No hyperdense vessels.  Carotid vascular calcification Skull: Normal. Negative for fracture or focal lesion. Sinuses/Orbits: No acute finding. Other: None CT CERVICAL SPINE FINDINGS Alignment: Straightening of the cervical spine. Trace anterolisthesis C4 on C5. Facet alignment is within normal limits. Skull base and vertebrae: No acute fracture. No primary bone lesion or focal pathologic process. Soft tissues and spinal canal: No prevertebral fluid or swelling.  No visible canal hematoma. Disc levels: Multilevel degenerative changes. Advanced disc space narrowing C5-C6 and C6-C7. Multilevel facet degenerative  changes with foraminal narrowing. Upper chest: Apical scarring.  Mild emphysema Other: None IMPRESSION: No CT evidence for acute intracranial abnormality. Atrophy and chronic small vessel ischemic changes of the white matter. Straightening of the cervical spine with degenerative changes. No acute osseous abnormality. Electronically Signed   By: Luke Bun M.D.   On: 04/30/2024 16:48   CT Cervical Spine Wo Contrast Result Date: 04/30/2024 CLINICAL DATA:  Fall EXAM: CT HEAD WITHOUT CONTRAST CT CERVICAL SPINE WITHOUT CONTRAST TECHNIQUE: Multidetector CT imaging of the head and cervical spine was performed following the standard protocol without intravenous contrast. Multiplanar CT image reconstructions of the cervical spine were also generated. RADIATION DOSE REDUCTION: This exam was performed according to the departmental dose-optimization program which includes automated exposure control, adjustment of the mA and/or kV according to patient size and/or use of iterative reconstruction technique. COMPARISON:  Chest CT 11/15/2023 FINDINGS: CT HEAD FINDINGS Brain: No acute territorial infarction, hemorrhage or intracranial mass. Atrophy and moderate chronic small vessel ischemic changes of the white matter. Nonenlarged ventricles. Probable chronic lacunar infarcts in the right thalamus and basal ganglia. Vascular: No hyperdense vessels.  Carotid vascular calcification Skull: Normal. Negative for fracture or focal lesion. Sinuses/Orbits: No acute finding. Other: None CT CERVICAL SPINE FINDINGS Alignment: Straightening of the cervical spine. Trace anterolisthesis C4 on C5. Facet alignment is within normal limits. Skull base and vertebrae: No acute fracture. No primary bone lesion or focal pathologic process. Soft tissues and spinal canal: No prevertebral fluid or swelling. No visible canal hematoma. Disc levels: Multilevel degenerative changes. Advanced disc space narrowing C5-C6 and C6-C7. Multilevel facet  degenerative changes with foraminal narrowing. Upper chest: Apical scarring.  Mild emphysema Other: None IMPRESSION: No CT evidence for acute intracranial abnormality. Atrophy and chronic small vessel ischemic changes of the white matter. Straightening of the cervical spine with degenerative changes. No acute osseous abnormality. Electronically Signed   By: Luke Bun M.D.   On: 04/30/2024 16:48    Microbiology: Results for orders placed or performed during the hospital encounter of 04/30/24  Urine Culture     Status: Abnormal   Collection Time: 04/30/24  5:55 PM   Specimen: Urine, Clean Catch  Result Value Ref Range Status   Specimen Description   Final    URINE, CLEAN CATCH Performed at [facility], [address] [place], [place] [address]    Special Requests   Final    NONE Performed at [facility], [address] [place], [place] [address]    Culture >=100,000 COLONIES/mL [facility] MORGANII (A)  Final   Report Status 05/02/2024 FINAL  Final   Organism ID, Bacteria MORGANELLA MORGANII (A)  Final      Susceptibility   Morganella morganii - MIC*    AMPICILLIN >=32 RESISTANT Resistant     ERTAPENEM <=0.12 SENSITIVE Sensitive     CIPROFLOXACIN  <=0.06 SENSITIVE Sensitive     GENTAMICIN <=1 SENSITIVE Sensitive     NITROFURANTOIN RESISTANT Resistant     TRIMETH/SULFA <=20 SENSITIVE Sensitive     AMPICILLIN/SULBACTAM >=32 RESISTANT Resistant     PIP/TAZO Value in next row Sensitive ug/mL     <=4 SENSITIVEThis is a modified FDA-approved test that has been validated and its performance characteristics determined by the reporting laboratory.  This laboratory is certified under the Clinical Laboratory Improvement Amendments CLIA as qualified to perform high complexity clinical laboratory testing.  MEROPENEM Value in next row Sensitive      <=4 SENSITIVEThis is a modified FDA-approved test that has been validated and its performance characteristics determined by the reporting  laboratory.  This laboratory is certified under the Clinical Laboratory Improvement Amendments CLIA as qualified to perform high complexity clinical laboratory testing.    * >=100,000 COLONIES/mL MORGANELLA MORGANII    Labs: CBC: Recent Labs  Lab 04/30/24 1648 05/01/24 0329 05/02/24 0431 05/03/24 0510  WBC 7.6 6.1 7.0 6.9  NEUTROABS 6.2  --   --   --   HGB 14.0 12.9 13.7 13.7  HCT 45.3 40.7 43.6 42.3  MCV 100.0 95.1 94.2 93.6  PLT 218 212 214 241   Basic Metabolic Panel: Recent Labs  Lab 04/30/24 1648 05/01/24 0329 05/02/24 0431 05/03/24 0510  NA 141 142 144 141  K 4.0 3.7 4.5 3.6  CL 106 105 108 105  CO2 20* 23 26 24   GLUCOSE 108* 106* 112* 113*  BUN 43* 40* 32* 25*  CREATININE 1.58* 1.48* 1.22* 1.12*  CALCIUM  9.2 9.0 8.8* 8.6*   Liver Function Tests: Recent Labs  Lab 04/30/24 1648  AST 34  ALT 25  ALKPHOS 108  BILITOT 1.1  PROT 7.4  ALBUMIN  3.1*    Discharge time spent:  35 minutes.  Signed: Eric Nunnery, MD [facility] 05/04/2024

## 2024-05-04 NOTE — Progress Notes (Signed)
 Patient will be discharged to [facility] room 511-1, report given to nurse tiffany . No further questions at this time, awaiting arrival of EMS.

## 2024-05-04 NOTE — TOC Transition Note (Signed)
 Transition of Care [facility]) - Discharge Note   Patient Details  Name: [name] MRN: [id number] Date of Birth: 1940/12/04  [facility] CM/SW Contact:  Lucie Lunger, LCSWA Phone Number: 05/04/2024, 10:28 AM   Clinical Narrative:    CSW updated that MD completed D/C for SNF placement at [facility] rehab. CSW confirmed insurance shara has been approved. CSW spoke to [place] in admissions who states they can accept pt today. CSW updated pts daughter of plan for D/C to SNF today, she is understanding. Med necessity printed to floor for RN. CSW to provide RN with room and report numbers when given by facility. EMS to be called for transport. TOC signing off.   Final next level of care: Skilled Nursing Facility Barriers to Discharge: Barriers Resolved   Patient Goals and CMS Choice Patient states their goals for this hospitalization and ongoing recovery are:: go to SNF CMS Medicare.gov Compare Post Acute Care list provided to:: Patient Represenative (must comment) Choice offered to / list presented to : Adult Children      Discharge Placement                Patient to be transferred to facility by: EMS Name of family member notified: daughter [name] Patient and family notified of of transfer: 05/04/24  Discharge Plan and Services Additional resources added to the After Visit Summary for                                       Social Drivers of Health (SDOH) Interventions SDOH Screenings   Food Insecurity: No Food Insecurity (04/30/2024)  Housing: Low Risk  (05/01/2024)  Transportation Needs: No Transportation Needs (04/30/2024)  Utilities: Not At Risk (04/30/2024)  Alcohol  Screen: Low Risk  (09/29/2021)  Depression (PHQ2-9): Low Risk  (04/16/2024)  Financial Resource Strain: Low Risk  (12/05/2022)  Physical Activity: Unknown (12/05/2022)  Social Connections: Moderately Isolated (04/30/2024)  Stress: No Stress Concern Present (12/05/2022)  Tobacco Use: Medium Risk  (04/30/2024)     Readmission Risk Interventions     No data to display

## 2024-05-05 DIAGNOSIS — J449 Chronic obstructive pulmonary disease, unspecified: Secondary | ICD-10-CM | POA: Diagnosis not present

## 2024-05-05 DIAGNOSIS — N183 Chronic kidney disease, stage 3 unspecified: Secondary | ICD-10-CM | POA: Diagnosis not present

## 2024-05-05 DIAGNOSIS — C50919 Malignant neoplasm of unspecified site of unspecified female breast: Secondary | ICD-10-CM | POA: Diagnosis not present

## 2024-05-05 DIAGNOSIS — C642 Malignant neoplasm of left kidney, except renal pelvis: Secondary | ICD-10-CM | POA: Diagnosis not present

## 2024-05-05 DIAGNOSIS — N3011 Interstitial cystitis (chronic) with hematuria: Secondary | ICD-10-CM | POA: Diagnosis not present

## 2024-05-05 DIAGNOSIS — E785 Hyperlipidemia, unspecified: Secondary | ICD-10-CM | POA: Diagnosis not present

## 2024-05-05 DIAGNOSIS — R42 Dizziness and giddiness: Secondary | ICD-10-CM | POA: Diagnosis not present

## 2024-05-05 DIAGNOSIS — C3490 Malignant neoplasm of unspecified part of unspecified bronchus or lung: Secondary | ICD-10-CM | POA: Diagnosis not present

## 2024-05-05 DIAGNOSIS — E039 Hypothyroidism, unspecified: Secondary | ICD-10-CM | POA: Diagnosis not present

## 2024-05-06 DIAGNOSIS — M6281 Muscle weakness (generalized): Secondary | ICD-10-CM | POA: Diagnosis not present

## 2024-05-06 DIAGNOSIS — R5381 Other malaise: Secondary | ICD-10-CM | POA: Diagnosis not present

## 2024-05-08 DIAGNOSIS — R5381 Other malaise: Secondary | ICD-10-CM | POA: Diagnosis not present

## 2024-05-08 DIAGNOSIS — E039 Hypothyroidism, unspecified: Secondary | ICD-10-CM | POA: Diagnosis not present

## 2024-05-08 DIAGNOSIS — E785 Hyperlipidemia, unspecified: Secondary | ICD-10-CM | POA: Diagnosis not present

## 2024-05-08 DIAGNOSIS — N183 Chronic kidney disease, stage 3 unspecified: Secondary | ICD-10-CM | POA: Diagnosis not present

## 2024-05-08 DIAGNOSIS — J449 Chronic obstructive pulmonary disease, unspecified: Secondary | ICD-10-CM | POA: Diagnosis not present

## 2024-05-11 DIAGNOSIS — N183 Chronic kidney disease, stage 3 unspecified: Secondary | ICD-10-CM | POA: Diagnosis not present

## 2024-05-11 DIAGNOSIS — R5381 Other malaise: Secondary | ICD-10-CM | POA: Diagnosis not present

## 2024-05-11 DIAGNOSIS — E785 Hyperlipidemia, unspecified: Secondary | ICD-10-CM | POA: Diagnosis not present

## 2024-05-11 DIAGNOSIS — E039 Hypothyroidism, unspecified: Secondary | ICD-10-CM | POA: Diagnosis not present

## 2024-05-11 DIAGNOSIS — J449 Chronic obstructive pulmonary disease, unspecified: Secondary | ICD-10-CM | POA: Diagnosis not present

## 2024-05-15 DIAGNOSIS — C642 Malignant neoplasm of left kidney, except renal pelvis: Secondary | ICD-10-CM | POA: Diagnosis not present

## 2024-05-15 DIAGNOSIS — C3490 Malignant neoplasm of unspecified part of unspecified bronchus or lung: Secondary | ICD-10-CM | POA: Diagnosis not present

## 2024-05-15 DIAGNOSIS — J449 Chronic obstructive pulmonary disease, unspecified: Secondary | ICD-10-CM | POA: Diagnosis not present

## 2024-05-15 DIAGNOSIS — N183 Chronic kidney disease, stage 3 unspecified: Secondary | ICD-10-CM | POA: Diagnosis not present

## 2024-05-15 DIAGNOSIS — E785 Hyperlipidemia, unspecified: Secondary | ICD-10-CM | POA: Diagnosis not present

## 2024-05-15 DIAGNOSIS — R5381 Other malaise: Secondary | ICD-10-CM | POA: Diagnosis not present

## 2024-05-15 DIAGNOSIS — C50919 Malignant neoplasm of unspecified site of unspecified female breast: Secondary | ICD-10-CM | POA: Diagnosis not present

## 2024-05-15 DIAGNOSIS — E039 Hypothyroidism, unspecified: Secondary | ICD-10-CM | POA: Diagnosis not present

## 2024-05-17 DIAGNOSIS — E785 Hyperlipidemia, unspecified: Secondary | ICD-10-CM | POA: Diagnosis not present

## 2024-05-17 DIAGNOSIS — J449 Chronic obstructive pulmonary disease, unspecified: Secondary | ICD-10-CM | POA: Diagnosis not present

## 2024-05-17 DIAGNOSIS — R5381 Other malaise: Secondary | ICD-10-CM | POA: Diagnosis not present

## 2024-05-17 DIAGNOSIS — C3490 Malignant neoplasm of unspecified part of unspecified bronchus or lung: Secondary | ICD-10-CM | POA: Diagnosis not present

## 2024-05-17 DIAGNOSIS — E039 Hypothyroidism, unspecified: Secondary | ICD-10-CM | POA: Diagnosis not present

## 2024-05-17 DIAGNOSIS — C50919 Malignant neoplasm of unspecified site of unspecified female breast: Secondary | ICD-10-CM | POA: Diagnosis not present

## 2024-05-17 DIAGNOSIS — N183 Chronic kidney disease, stage 3 unspecified: Secondary | ICD-10-CM | POA: Diagnosis not present

## 2024-05-17 DIAGNOSIS — C642 Malignant neoplasm of left kidney, except renal pelvis: Secondary | ICD-10-CM | POA: Diagnosis not present

## 2024-05-21 ENCOUNTER — Ambulatory Visit: Admitting: Family Medicine

## 2024-05-22 ENCOUNTER — Inpatient Hospital Stay: Attending: Oncology

## 2024-05-22 ENCOUNTER — Inpatient Hospital Stay

## 2024-05-22 ENCOUNTER — Ambulatory Visit (HOSPITAL_COMMUNITY)

## 2024-05-22 DIAGNOSIS — Z800 Family history of malignant neoplasm of digestive organs: Secondary | ICD-10-CM | POA: Diagnosis not present

## 2024-05-22 DIAGNOSIS — Z801 Family history of malignant neoplasm of trachea, bronchus and lung: Secondary | ICD-10-CM | POA: Diagnosis not present

## 2024-05-22 DIAGNOSIS — Z85118 Personal history of other malignant neoplasm of bronchus and lung: Secondary | ICD-10-CM | POA: Insufficient documentation

## 2024-05-22 DIAGNOSIS — R5383 Other fatigue: Secondary | ICD-10-CM | POA: Diagnosis not present

## 2024-05-22 DIAGNOSIS — J449 Chronic obstructive pulmonary disease, unspecified: Secondary | ICD-10-CM | POA: Diagnosis not present

## 2024-05-22 DIAGNOSIS — Z87891 Personal history of nicotine dependence: Secondary | ICD-10-CM | POA: Insufficient documentation

## 2024-05-22 DIAGNOSIS — Z905 Acquired absence of kidney: Secondary | ICD-10-CM | POA: Insufficient documentation

## 2024-05-22 DIAGNOSIS — C3492 Malignant neoplasm of unspecified part of left bronchus or lung: Secondary | ICD-10-CM

## 2024-05-22 DIAGNOSIS — N3000 Acute cystitis without hematuria: Secondary | ICD-10-CM | POA: Insufficient documentation

## 2024-05-22 DIAGNOSIS — D509 Iron deficiency anemia, unspecified: Secondary | ICD-10-CM

## 2024-05-22 DIAGNOSIS — D649 Anemia, unspecified: Secondary | ICD-10-CM | POA: Diagnosis not present

## 2024-05-22 DIAGNOSIS — C50919 Malignant neoplasm of unspecified site of unspecified female breast: Secondary | ICD-10-CM | POA: Diagnosis not present

## 2024-05-22 DIAGNOSIS — E039 Hypothyroidism, unspecified: Secondary | ICD-10-CM | POA: Diagnosis not present

## 2024-05-22 DIAGNOSIS — Z853 Personal history of malignant neoplasm of breast: Secondary | ICD-10-CM | POA: Diagnosis not present

## 2024-05-22 DIAGNOSIS — E785 Hyperlipidemia, unspecified: Secondary | ICD-10-CM | POA: Diagnosis not present

## 2024-05-22 DIAGNOSIS — N183 Chronic kidney disease, stage 3 unspecified: Secondary | ICD-10-CM | POA: Diagnosis not present

## 2024-05-22 DIAGNOSIS — R413 Other amnesia: Secondary | ICD-10-CM | POA: Diagnosis not present

## 2024-05-22 DIAGNOSIS — Z85528 Personal history of other malignant neoplasm of kidney: Secondary | ICD-10-CM | POA: Insufficient documentation

## 2024-05-22 DIAGNOSIS — Z9012 Acquired absence of left breast and nipple: Secondary | ICD-10-CM | POA: Diagnosis not present

## 2024-05-22 DIAGNOSIS — R5381 Other malaise: Secondary | ICD-10-CM | POA: Diagnosis not present

## 2024-05-22 DIAGNOSIS — C642 Malignant neoplasm of left kidney, except renal pelvis: Secondary | ICD-10-CM | POA: Diagnosis not present

## 2024-05-22 DIAGNOSIS — C3490 Malignant neoplasm of unspecified part of unspecified bronchus or lung: Secondary | ICD-10-CM | POA: Diagnosis not present

## 2024-05-22 LAB — COMPREHENSIVE METABOLIC PANEL WITH GFR
ALT: 21 U/L (ref 0–44)
AST: 29 U/L (ref 15–41)
Albumin: 3.5 g/dL (ref 3.5–5.0)
Alkaline Phosphatase: 110 U/L (ref 38–126)
Anion gap: 14 (ref 5–15)
BUN: 17 mg/dL (ref 8–23)
CO2: 22 mmol/L (ref 22–32)
Calcium: 9.5 mg/dL (ref 8.9–10.3)
Chloride: 101 mmol/L (ref 98–111)
Creatinine, Ser: 1.13 mg/dL — ABNORMAL HIGH (ref 0.44–1.00)
GFR, Estimated: 49 mL/min — ABNORMAL LOW (ref >60)
Glucose, Bld: 140 mg/dL — ABNORMAL HIGH (ref 70–99)
Potassium: 3.9 mmol/L (ref 3.5–5.1)
Sodium: 137 mmol/L (ref 135–145)
Total Bilirubin: 0.6 mg/dL (ref 0.0–1.2)
Total Protein: 7.7 g/dL (ref 6.5–8.1)

## 2024-05-22 LAB — CBC WITH DIFFERENTIAL/PLATELET
Abs Immature Granulocytes: 0.02 K/uL (ref 0.00–0.07)
Basophils Absolute: 0.1 K/uL (ref 0.0–0.1)
Basophils Relative: 1 %
Eosinophils Absolute: 0.3 K/uL (ref 0.0–0.5)
Eosinophils Relative: 4 %
HCT: 46.9 % — ABNORMAL HIGH (ref 36.0–46.0)
Hemoglobin: 15 g/dL (ref 12.0–15.0)
Immature Granulocytes: 0 %
Lymphocytes Relative: 17 %
Lymphs Abs: 1.1 K/uL (ref 0.7–4.0)
MCH: 29.5 pg (ref 26.0–34.0)
MCHC: 32 g/dL (ref 30.0–36.0)
MCV: 92.3 fL (ref 80.0–100.0)
Monocytes Absolute: 0.3 K/uL (ref 0.1–1.0)
Monocytes Relative: 5 %
Neutro Abs: 5 K/uL (ref 1.7–7.7)
Neutrophils Relative %: 73 %
Platelets: 436 K/uL — ABNORMAL HIGH (ref 150–400)
RBC: 5.08 MIL/uL (ref 3.87–5.11)
RDW: 13.7 % (ref 11.5–15.5)
WBC: 6.8 K/uL (ref 4.0–10.5)
nRBC: 0 % (ref 0.0–0.2)

## 2024-05-22 LAB — IRON AND TIBC
Iron: 47 ug/dL (ref 28–170)
Saturation Ratios: 18 % (ref 10.4–31.8)
TIBC: 257 ug/dL (ref 250–450)
UIBC: 210 ug/dL

## 2024-05-22 LAB — FERRITIN: Ferritin: 102 ng/mL (ref 11–307)

## 2024-05-25 ENCOUNTER — Ambulatory Visit (HOSPITAL_COMMUNITY)

## 2024-05-25 ENCOUNTER — Ambulatory Visit (HOSPITAL_COMMUNITY)
Admission: RE | Admit: 2024-05-25 | Discharge: 2024-05-25 | Disposition: A | Discharge status: Home / Self Care | Source: Ambulatory Visit | Attending: Hematology | Admitting: Hematology

## 2024-05-25 DIAGNOSIS — C3490 Malignant neoplasm of unspecified part of unspecified bronchus or lung: Secondary | ICD-10-CM | POA: Diagnosis not present

## 2024-05-25 DIAGNOSIS — E039 Hypothyroidism, unspecified: Secondary | ICD-10-CM | POA: Diagnosis not present

## 2024-05-25 DIAGNOSIS — E785 Hyperlipidemia, unspecified: Secondary | ICD-10-CM | POA: Diagnosis not present

## 2024-05-25 DIAGNOSIS — N183 Chronic kidney disease, stage 3 unspecified: Secondary | ICD-10-CM | POA: Diagnosis not present

## 2024-05-25 DIAGNOSIS — C3492 Malignant neoplasm of unspecified part of left bronchus or lung: Secondary | ICD-10-CM | POA: Insufficient documentation

## 2024-05-25 DIAGNOSIS — J449 Chronic obstructive pulmonary disease, unspecified: Secondary | ICD-10-CM | POA: Diagnosis not present

## 2024-05-25 DIAGNOSIS — J439 Emphysema, unspecified: Secondary | ICD-10-CM | POA: Diagnosis not present

## 2024-05-25 DIAGNOSIS — R5381 Other malaise: Secondary | ICD-10-CM | POA: Diagnosis not present

## 2024-05-25 DIAGNOSIS — C642 Malignant neoplasm of left kidney, except renal pelvis: Secondary | ICD-10-CM | POA: Diagnosis not present

## 2024-05-25 DIAGNOSIS — I700 Atherosclerosis of aorta: Secondary | ICD-10-CM | POA: Diagnosis not present

## 2024-05-25 DIAGNOSIS — C50919 Malignant neoplasm of unspecified site of unspecified female breast: Secondary | ICD-10-CM | POA: Diagnosis not present

## 2024-05-28 ENCOUNTER — Inpatient Hospital Stay: Admitting: Oncology

## 2024-06-02 ENCOUNTER — Inpatient Hospital Stay: Admitting: Oncology

## 2024-06-02 VITALS — BP 120/73 | HR 93 | Temp 98.2°F | Resp 19 | Ht 66.0 in

## 2024-06-02 DIAGNOSIS — R5383 Other fatigue: Secondary | ICD-10-CM | POA: Diagnosis not present

## 2024-06-02 DIAGNOSIS — Z9012 Acquired absence of left breast and nipple: Secondary | ICD-10-CM | POA: Diagnosis not present

## 2024-06-02 DIAGNOSIS — Z853 Personal history of malignant neoplasm of breast: Secondary | ICD-10-CM | POA: Diagnosis not present

## 2024-06-02 DIAGNOSIS — R413 Other amnesia: Secondary | ICD-10-CM | POA: Diagnosis not present

## 2024-06-02 DIAGNOSIS — N3000 Acute cystitis without hematuria: Secondary | ICD-10-CM

## 2024-06-02 DIAGNOSIS — Z905 Acquired absence of kidney: Secondary | ICD-10-CM | POA: Diagnosis not present

## 2024-06-02 DIAGNOSIS — Z85118 Personal history of other malignant neoplasm of bronchus and lung: Secondary | ICD-10-CM | POA: Diagnosis not present

## 2024-06-02 DIAGNOSIS — C3490 Malignant neoplasm of unspecified part of unspecified bronchus or lung: Secondary | ICD-10-CM | POA: Diagnosis not present

## 2024-06-02 DIAGNOSIS — D649 Anemia, unspecified: Secondary | ICD-10-CM | POA: Insufficient documentation

## 2024-06-02 DIAGNOSIS — Z85528 Personal history of other malignant neoplasm of kidney: Secondary | ICD-10-CM | POA: Diagnosis not present

## 2024-06-02 NOTE — Assessment & Plan Note (Addendum)
 At her last visit, Dr. Katragadda recommended 1 dose of INFeD but unfortunately, she never had this done because of poor tolerance of Feraheme  back in 2021. Daughter admits they have been more consistent with taking her iron supplement with vitamin C. She denies any melena, hematochezia or bright red blood per rectum. Repeat lab work from 05/22/2024 shows hemoglobin of 15, platelets 436, ferritin 102, iron panel iron saturation 18% with normal TIBC and stable creatinine 1.13. We discussed that hemoglobin is normal but iron saturations did drop a bit.  Ferritin is improved but also could be elevated secondary to recent infection/inflammation with a UTI. If no improvement with next lab draw, we could try IV Venofer to see if she could tolerate this better. Daughter in agreement.

## 2024-06-02 NOTE — Progress Notes (Signed)
 [facility] OFFICE PROGRESS NOTE  Zollie Lowers, MD  ASSESSMENT & PLAN:    Assessment & Plan Malignant neoplasm of lung, unspecified laterality, unspecified part of lung (HCC) - Does not report any chest pain or hemoptysis.  No new cough reported.  - Labs from 05/22/2024: Creatinine 1.13 stable.  LFTs are normal.  CBC is normal. - CT chest without contrast on 05/25/24: No evidence of recurrence or metastatic disease. - Recommend follow-up in 6 months with repeat CT of the chest. Normocytic anemia At her last visit, Dr. Katragadda recommended 1 dose of INFeD but unfortunately, she never had this done because of poor tolerance of Feraheme  back in 2021. Daughter admits they have been more consistent with taking her iron supplement with vitamin C. She denies any melena, hematochezia or bright red blood per rectum. Repeat lab work from 05/22/2024 shows hemoglobin of 15, platelets 436, ferritin 102, iron panel iron saturation 18% with normal TIBC and stable creatinine 1.13. We discussed that hemoglobin is normal but iron saturations did drop a bit.  Ferritin is improved but also could be elevated secondary to recent infection/inflammation with a UTI. If no improvement with next lab draw, we could try IV Venofer to see if she could tolerate this better. Daughter in agreement.  Acute cystitis without hematuria Patient was recently hospitalized after falling out of bed at home.  Treated for a UTI.  Reports she was sent to a rehab facility because she lives alone and needed 24-hour care. Daughter is hoping to get her moved from [facility] rehab to something closer to [place].  Reports health insurance is no longer covering her stay but she is unable to move her home.  She is having to pay out-of-pocket. Still has some residual memory loss since her hospital stay.  Orders Placed This Encounter  Procedures   CT CHEST WO CONTRAST    Standing Status:   Future    Expected Date:   12/03/2024     Expiration Date:   06/02/2025    Preferred imaging location?:   [facility]   CBC with Differential    Standing Status:   Future    Expected Date:   12/02/2024    Expiration Date:   06/02/2025   Comprehensive metabolic panel    Standing Status:   Future    Expected Date:   12/02/2024    Expiration Date:   06/02/2025   Iron and TIBC ([facility] ONLY)    Standing Status:   Future    Expected Date:   12/02/2024    Expiration Date:   06/02/2025   Ferritin    Standing Status:   Future    Expected Date:   12/02/2024    Expiration Date:   06/02/2025    INTERVAL HISTORY: Patient returns for follow-up.  She was recently hospitalized from 04/30/2024 through 05/04/2024 for generalized weakness and fall and subsequently found to be dehydrated with UTI.  She was treated with IV antibiotics and given IV fluids and discharged to [facility].  She presents today with her daughter.  Daughter states that she is still not cognitively improved since her hospital stay.  She is unsure of how much physical therapy she is actually having done.  Reports her mother does refuse on occasion.  Reports that she knows that her mother will not be able to go home and will likely need 24-hour care moving forward.  She and her sister are trying to get her into a  rehab facility that is closer to their home in [place].  They are having to pay out-of-pocket at this point as her health insurance has denied any further coverage.  Cognitive impairment has improved some since her hospital stay.  Denies any bright red blood per rectum, melena or hematochezia.  Appetite is 50% energy levels are low.  Denies any pain.  Denies any new cough, chest pain or shortness of breath.  We reviewed CBC, CMP, iron panel and ferritin.  SUMMARY OF HEMATOLOGIC HISTORY: Oncology History Overview Note  1.  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 -Brain MRI on  12/07/2019 with no metastatic disease. -Adjuvant chemotherapy was not offered based on poor performance status and weight loss. -CT chest on 01/08/2020 did not show any evidence of local recurrence left lung.  Trace loculated medial left pleural effusion.  No evidence of metastatic disease in the chest.  Tiny medial right upper lobe 3 mm solid pulmonary nodule decreased in the interval, presumably benign. -CT chest with contrast on 06/21/2020 with no evidence of recurrence or metastatic disease.  3 mm nodule in the medial right chest is unchanged. -CT chest with contrast on 09/09/2020 showed stable postoperative changes from left upper lobectomy with no evidence of recurrence or metastasis.   2.  Normocytic anemia: -Feraheme  on 03/25/2020 and 04/01/2020.   3.  Left kidney clear cell renal cell carcinoma: -Left radical nephrectomy on 03/25/2019.  Pathology shows RCC, conventional clear cell type, nuclear grade 2 (5.8 cm).  Tumor invades anterior perinephric fat and segmental renal vein (P T3a), ureteral, vascular normal resection margins are negative   Clear cell renal cell carcinoma, left (HCC)  04/13/2019 Initial Diagnosis   Clear cell renal cell carcinoma, left (HCC)   04/13/2019 Cancer Staging   Staging form: Kidney, AJCC 8th Edition - Clinical: Stage III (cT3a, cNX, cM0) - Signed by Rogers Hai, MD on 04/13/2019   Lung cancer (HCC)  09/11/2019 Initial Diagnosis   Lung cancer (HCC)   10/26/2019 Cancer Staging   Staging form: Lung, AJCC 8th Edition - Clinical stage from 10/26/2019: Stage IIB (cT1b, cN1, cM0) - Signed by Rogers Hai, MD on 10/26/2019      CBC    Component Value Date/Time   WBC 6.8 05/22/2024 0858   RBC 5.08 05/22/2024 0858   HGB 15.0 05/22/2024 0858   HGB 13.7 11/28/2023 1158   HCT 46.9 (H) 05/22/2024 0858   HCT 41.5 11/28/2023 1158   PLT 436 (H) 05/22/2024 0858   PLT 239 11/28/2023 1158   MCV 92.3 05/22/2024 0858   MCV 94 11/28/2023 1158   MCH 29.5  05/22/2024 0858   MCHC 32.0 05/22/2024 0858   RDW 13.7 05/22/2024 0858   RDW 12.5 11/28/2023 1158   LYMPHSABS 1.1 05/22/2024 0858   LYMPHSABS 1.3 11/28/2023 1158   MONOABS 0.3 05/22/2024 0858   EOSABS 0.3 05/22/2024 0858   EOSABS 0.3 11/28/2023 1158   BASOSABS 0.1 05/22/2024 0858   BASOSABS 0.1 11/28/2023 1158       Latest Ref Rng & Units 05/22/2024    8:58 AM 05/03/2024    5:10 AM 05/02/2024    4:31 AM  CMP  Glucose 70 - 99 mg/dL 859  886  887   BUN 8 - 23 mg/dL 17  25  32   Creatinine 0.44 - 1.00 mg/dL 8.86  8.87  8.77   Sodium 135 - 145 mmol/L 137  141  144   Potassium 3.5 - 5.1 mmol/L 3.9  3.6  4.5   Chloride 98 - 111 mmol/L 101  105  108   CO2 22 - 32 mmol/L 22  24  26    Calcium  8.9 - 10.3 mg/dL 9.5  8.6  8.8   Total Protein 6.5 - 8.1 g/dL 7.7     Total Bilirubin 0.0 - 1.2 mg/dL 0.6     Alkaline Phos 38 - 126 U/L 110     AST 15 - 41 U/L 29     ALT 0 - 44 U/L 21        Lab Results  Component Value Date   FERRITIN 102 05/22/2024   VITAMINB12 681 04/30/2024    Vitals:   06/02/24 0922  BP: 120/73  Pulse: 93  Resp: 19  Temp: 98.2 F (36.8 C)  SpO2: 93%    Review of System:  Review of Systems  Constitutional:  Positive for malaise/fatigue. Negative for weight loss.  Respiratory:  Negative for cough, hemoptysis, sputum production and shortness of breath.   Musculoskeletal:  Positive for falls.  Neurological:  Positive for weakness.  Psychiatric/Behavioral:  Positive for memory loss.     Physical Exam: Physical Exam Constitutional:      Appearance: Normal appearance.  HENT:     Head: Normocephalic and atraumatic.  Eyes:     Pupils: Pupils are equal, round, and reactive to light.  Cardiovascular:     Rate and Rhythm: Normal rate and regular rhythm.     Heart sounds: Normal heart sounds. No murmur heard. Pulmonary:     Effort: Pulmonary effort is normal.     Breath sounds: Normal breath sounds. No wheezing.  Abdominal:     General: Bowel sounds are  normal. There is no distension.     Palpations: Abdomen is soft.     Tenderness: There is no abdominal tenderness.  Musculoskeletal:        General: Normal range of motion.     Cervical back: Normal range of motion.  Skin:    General: Skin is warm and dry.     Findings: No rash.  Neurological:     Mental Status: She is alert and oriented to person, place, and time.     Gait: Gait is intact.  Psychiatric:        Mood and Affect: Mood and affect normal.        Cognition and Memory: Memory normal.        Judgment: Judgment normal.      I spent 25 minutes dedicated to the care of this patient (face-to-face and non-face-to-face) on the date of the encounter to include what is described in the assessment and plan.,  Delon Hope, NP 06/02/2024 10:16 AM

## 2024-06-02 NOTE — Assessment & Plan Note (Addendum)
-   Does not report any chest pain or hemoptysis.  No new cough reported.  - Labs from 05/22/2024: Creatinine 1.13 stable.  LFTs are normal.  CBC is normal. - CT chest without contrast on 05/25/24: No evidence of recurrence or metastatic disease. - Recommend follow-up in 6 months with repeat CT of the chest.

## 2024-06-05 DIAGNOSIS — C642 Malignant neoplasm of left kidney, except renal pelvis: Secondary | ICD-10-CM | POA: Diagnosis not present

## 2024-06-05 DIAGNOSIS — R5381 Other malaise: Secondary | ICD-10-CM | POA: Diagnosis not present

## 2024-06-05 DIAGNOSIS — J449 Chronic obstructive pulmonary disease, unspecified: Secondary | ICD-10-CM | POA: Diagnosis not present

## 2024-06-05 DIAGNOSIS — C50919 Malignant neoplasm of unspecified site of unspecified female breast: Secondary | ICD-10-CM | POA: Diagnosis not present

## 2024-06-05 DIAGNOSIS — C3490 Malignant neoplasm of unspecified part of unspecified bronchus or lung: Secondary | ICD-10-CM | POA: Diagnosis not present

## 2024-06-05 DIAGNOSIS — E785 Hyperlipidemia, unspecified: Secondary | ICD-10-CM | POA: Diagnosis not present

## 2024-06-05 DIAGNOSIS — E039 Hypothyroidism, unspecified: Secondary | ICD-10-CM | POA: Diagnosis not present

## 2024-06-05 DIAGNOSIS — N183 Chronic kidney disease, stage 3 unspecified: Secondary | ICD-10-CM | POA: Diagnosis not present

## 2024-06-08 DIAGNOSIS — E785 Hyperlipidemia, unspecified: Secondary | ICD-10-CM | POA: Diagnosis not present

## 2024-06-08 DIAGNOSIS — R5381 Other malaise: Secondary | ICD-10-CM | POA: Diagnosis not present

## 2024-06-08 DIAGNOSIS — J449 Chronic obstructive pulmonary disease, unspecified: Secondary | ICD-10-CM | POA: Diagnosis not present

## 2024-06-08 DIAGNOSIS — E039 Hypothyroidism, unspecified: Secondary | ICD-10-CM | POA: Diagnosis not present

## 2024-06-08 DIAGNOSIS — N183 Chronic kidney disease, stage 3 unspecified: Secondary | ICD-10-CM | POA: Diagnosis not present

## 2024-06-10 ENCOUNTER — Ambulatory Visit (HOSPITAL_COMMUNITY)

## 2024-06-13 DIAGNOSIS — M2042 Other hammer toe(s) (acquired), left foot: Secondary | ICD-10-CM | POA: Diagnosis not present

## 2024-06-13 DIAGNOSIS — I7091 Generalized atherosclerosis: Secondary | ICD-10-CM | POA: Diagnosis not present

## 2024-06-13 DIAGNOSIS — M2041 Other hammer toe(s) (acquired), right foot: Secondary | ICD-10-CM | POA: Diagnosis not present

## 2024-06-17 ENCOUNTER — Other Ambulatory Visit: Payer: Self-pay | Admitting: Family Medicine

## 2024-06-24 DIAGNOSIS — L299 Pruritus, unspecified: Secondary | ICD-10-CM | POA: Diagnosis not present

## 2024-06-24 DIAGNOSIS — R21 Rash and other nonspecific skin eruption: Secondary | ICD-10-CM | POA: Diagnosis not present

## 2024-06-24 DIAGNOSIS — T7840XS Allergy, unspecified, sequela: Secondary | ICD-10-CM | POA: Diagnosis not present

## 2024-06-30 DIAGNOSIS — E039 Hypothyroidism, unspecified: Secondary | ICD-10-CM | POA: Diagnosis not present

## 2024-06-30 DIAGNOSIS — M6281 Muscle weakness (generalized): Secondary | ICD-10-CM | POA: Diagnosis not present

## 2024-06-30 DIAGNOSIS — J449 Chronic obstructive pulmonary disease, unspecified: Secondary | ICD-10-CM | POA: Diagnosis not present

## 2024-07-02 ENCOUNTER — Telehealth: Payer: Self-pay | Admitting: Family Medicine

## 2024-07-02 NOTE — Telephone Encounter (Signed)
 Copied from CRM [id number]. Topic: Clinical - Medical Advice >> Jul 02, 2024  2:49 PM Montie POUR wrote: Reason for CRM:  Darice, daughter, needs a call back to discuss a letter that [employer] needs. The letter needs to state that [place] needs 24/7 care in a skilled nursing facility. Please call Darice at [phone] as soon as possible because Darice is paying out of pocket for long term skilled nursing. Medicaid is giving her until Monday (07/06/24) to get the [employer] beneficiary in [place] [employer] home's name. The [employer] must have the letter before they can change anything. Thanks

## 2024-07-02 NOTE — Telephone Encounter (Signed)
 Okay to write letter

## 2024-07-09 NOTE — Telephone Encounter (Signed)
 Called again, unable to lm on vm. Date has passed that letter was needed by. LS

## 2024-07-22 ENCOUNTER — Encounter: Payer: Self-pay | Admitting: Oncology

## 2024-07-23 ENCOUNTER — Encounter (HOSPITAL_COMMUNITY): Payer: Self-pay

## 2024-07-23 ENCOUNTER — Ambulatory Visit (HOSPITAL_COMMUNITY)
Admission: RE | Admit: 2024-07-23 | Discharge: 2024-07-23 | Disposition: A | Discharge status: Home / Self Care | Source: Ambulatory Visit | Attending: Family Medicine | Admitting: Family Medicine

## 2024-07-23 DIAGNOSIS — Z1231 Encounter for screening mammogram for malignant neoplasm of breast: Secondary | ICD-10-CM | POA: Insufficient documentation

## 2024-07-28 ENCOUNTER — Other Ambulatory Visit (HOSPITAL_COMMUNITY): Payer: Self-pay | Admitting: Family Medicine

## 2024-07-28 DIAGNOSIS — R928 Other abnormal and inconclusive findings on diagnostic imaging of breast: Secondary | ICD-10-CM

## 2024-08-07 DIAGNOSIS — Z1329 Encounter for screening for other suspected endocrine disorder: Secondary | ICD-10-CM | POA: Diagnosis not present

## 2024-08-07 DIAGNOSIS — E039 Hypothyroidism, unspecified: Secondary | ICD-10-CM | POA: Diagnosis not present

## 2024-08-31 ENCOUNTER — Encounter: Payer: Self-pay | Admitting: *Deleted

## 2024-09-01 ENCOUNTER — Ambulatory Visit (HOSPITAL_COMMUNITY)
Admission: RE | Admit: 2024-09-01 | Discharge: 2024-09-01 | Disposition: A | Discharge status: Home / Self Care | Source: Ambulatory Visit | Attending: Family Medicine | Admitting: Family Medicine

## 2024-09-01 DIAGNOSIS — R928 Other abnormal and inconclusive findings on diagnostic imaging of breast: Secondary | ICD-10-CM | POA: Insufficient documentation

## 2024-10-22 ENCOUNTER — Ambulatory Visit: Payer: Self-pay | Admitting: Family Medicine

## 2024-12-01 ENCOUNTER — Other Ambulatory Visit (HOSPITAL_COMMUNITY)

## 2024-12-01 ENCOUNTER — Other Ambulatory Visit

## 2024-12-08 ENCOUNTER — Inpatient Hospital Stay: Admitting: Physician Assistant

## 2024-12-08 ENCOUNTER — Ambulatory Visit: Admitting: Oncology
